# Patient Record
Sex: Male | Born: 1945 | Race: White | Hispanic: No | Smoking: Current some day smoker
Health system: Southern US, Community
[De-identification: ages and names within clinical notes are randomized; demographics above are authoritative.]

## PROBLEM LIST (undated history)

## (undated) DIAGNOSIS — I428 Other cardiomyopathies: Secondary | ICD-10-CM

## (undated) DIAGNOSIS — I447 Left bundle-branch block, unspecified: Secondary | ICD-10-CM

## (undated) DIAGNOSIS — Z87442 Personal history of urinary calculi: Secondary | ICD-10-CM

## (undated) DIAGNOSIS — I209 Angina pectoris, unspecified: Secondary | ICD-10-CM

## (undated) DIAGNOSIS — G459 Transient cerebral ischemic attack, unspecified: Secondary | ICD-10-CM

## (undated) DIAGNOSIS — E079 Disorder of thyroid, unspecified: Secondary | ICD-10-CM

## (undated) DIAGNOSIS — I2510 Atherosclerotic heart disease of native coronary artery without angina pectoris: Secondary | ICD-10-CM

## (undated) DIAGNOSIS — I5042 Chronic combined systolic (congestive) and diastolic (congestive) heart failure: Secondary | ICD-10-CM

## (undated) DIAGNOSIS — I10 Essential (primary) hypertension: Secondary | ICD-10-CM

## (undated) DIAGNOSIS — M1990 Unspecified osteoarthritis, unspecified site: Secondary | ICD-10-CM

## (undated) DIAGNOSIS — N189 Chronic kidney disease, unspecified: Secondary | ICD-10-CM

## (undated) DIAGNOSIS — Z8679 Personal history of other diseases of the circulatory system: Secondary | ICD-10-CM

## (undated) DIAGNOSIS — Z95810 Presence of automatic (implantable) cardiac defibrillator: Secondary | ICD-10-CM

## (undated) DIAGNOSIS — I639 Cerebral infarction, unspecified: Secondary | ICD-10-CM

## (undated) DIAGNOSIS — F1010 Alcohol abuse, uncomplicated: Secondary | ICD-10-CM

## (undated) HISTORY — PX: VASECTOMY: SHX75

## (undated) HISTORY — DX: Left bundle-branch block, unspecified: I44.7

## (undated) HISTORY — DX: Alcohol abuse, uncomplicated: F10.10

## (undated) HISTORY — DX: Personal history of other diseases of the circulatory system: Z86.79

## (undated) HISTORY — PX: CARDIAC CATHETERIZATION: SHX172

## (undated) HISTORY — DX: Other cardiomyopathies: I42.8

## (undated) HISTORY — PX: CYSTOSCOPY W/ STONE MANIPULATION: SHX1427

## (undated) HISTORY — PX: TONSILLECTOMY: SUR1361

## (undated) HISTORY — DX: Disorder of thyroid, unspecified: E07.9

## (undated) HISTORY — PX: INSERT / REPLACE / REMOVE PACEMAKER: SUR710

## (undated) HISTORY — DX: Cerebral infarction, unspecified: I63.9

---

## 1997-10-31 ENCOUNTER — Ambulatory Visit (HOSPITAL_COMMUNITY): Admission: RE | Admit: 1997-10-31 | Discharge: 1997-10-31 | Payer: Self-pay | Admitting: Urology

## 1997-11-09 ENCOUNTER — Encounter: Payer: Self-pay | Admitting: Urology

## 1997-11-09 ENCOUNTER — Ambulatory Visit (HOSPITAL_COMMUNITY): Admission: RE | Admit: 1997-11-09 | Discharge: 1997-11-09 | Payer: Self-pay | Admitting: Urology

## 2000-01-17 ENCOUNTER — Ambulatory Visit (HOSPITAL_COMMUNITY): Admission: RE | Admit: 2000-01-17 | Discharge: 2000-01-17 | Payer: Self-pay | Admitting: Cardiology

## 2000-01-20 ENCOUNTER — Ambulatory Visit (HOSPITAL_COMMUNITY): Admission: RE | Admit: 2000-01-20 | Discharge: 2000-01-20 | Payer: Self-pay | Admitting: Cardiology

## 2000-10-06 ENCOUNTER — Encounter: Admission: RE | Admit: 2000-10-06 | Discharge: 2000-10-06 | Payer: Self-pay | Admitting: Cardiology

## 2001-02-26 ENCOUNTER — Encounter: Admission: RE | Admit: 2001-02-26 | Discharge: 2001-02-26 | Payer: Self-pay | Admitting: Internal Medicine

## 2001-02-26 ENCOUNTER — Encounter: Payer: Self-pay | Admitting: Internal Medicine

## 2001-03-02 ENCOUNTER — Encounter: Admission: RE | Admit: 2001-03-02 | Discharge: 2001-04-20 | Payer: Self-pay | Admitting: Internal Medicine

## 2003-05-12 ENCOUNTER — Inpatient Hospital Stay (HOSPITAL_COMMUNITY): Admission: EM | Admit: 2003-05-12 | Discharge: 2003-05-16 | Payer: Self-pay | Admitting: Emergency Medicine

## 2004-02-01 ENCOUNTER — Ambulatory Visit (HOSPITAL_COMMUNITY): Admission: RE | Admit: 2004-02-01 | Discharge: 2004-02-01 | Payer: Self-pay | Admitting: Cardiology

## 2004-08-08 ENCOUNTER — Ambulatory Visit: Payer: Self-pay | Admitting: Psychiatry

## 2004-08-08 ENCOUNTER — Inpatient Hospital Stay (HOSPITAL_COMMUNITY): Admission: RE | Admit: 2004-08-08 | Discharge: 2004-08-13 | Payer: Self-pay | Admitting: Psychiatry

## 2004-08-16 ENCOUNTER — Ambulatory Visit (HOSPITAL_COMMUNITY): Payer: Self-pay | Admitting: Psychiatry

## 2005-06-18 ENCOUNTER — Encounter: Payer: Self-pay | Admitting: Urology

## 2006-08-09 ENCOUNTER — Inpatient Hospital Stay (HOSPITAL_COMMUNITY): Admission: EM | Admit: 2006-08-09 | Discharge: 2006-08-13 | Payer: Self-pay | Admitting: Emergency Medicine

## 2006-08-10 ENCOUNTER — Encounter (INDEPENDENT_AMBULATORY_CARE_PROVIDER_SITE_OTHER): Payer: Self-pay | Admitting: Cardiology

## 2006-08-27 ENCOUNTER — Inpatient Hospital Stay (HOSPITAL_COMMUNITY): Admission: RE | Admit: 2006-08-27 | Discharge: 2006-08-28 | Payer: Self-pay | Admitting: Cardiology

## 2010-05-06 ENCOUNTER — Encounter (INDEPENDENT_AMBULATORY_CARE_PROVIDER_SITE_OTHER): Payer: Self-pay | Admitting: *Deleted

## 2010-05-16 NOTE — Letter (Signed)
Summary: Appointment - Reminder 2  [facility] [employer], Main Office  [address]   [place], [place] [address]   Phone: [phone]  Fax: [phone]     May 06, 2010 MRN: [id number]   [name] [address] [place], [place]  [address]   Dear Mr. Plamondon,  Our records indicate that it is time to schedule a follow-up appointment.  Dr.Taylor recommended that you follow up with [place] in April. It is very important that we reach you to schedule this appointment. We look forward to participating in your health care needs. Please contact us at the number listed above at your earliest convenience to schedule your appointment.  If you are unable to make an appointment at this time, give [place] a call so we can update our records.     Sincerely,   Glass blower/designer

## 2010-07-02 NOTE — H&P (Signed)
NAME:  AUGUSTIN, BUN NO.:  [id number]   MEDICAL RECORD NO.:  [id number]          PATIENT TYPE:  INP   LOCATION:  2031                         FACILITY:  [facility]   PHYSICIAN:  Elmore Guise., M.D.DATE OF BIRTH:  22-Jun-1945   DATE OF ADMISSION:  08/09/2006  DATE OF DISCHARGE:                              HISTORY & PHYSICAL   PRIMARY CARE PHYSICIAN:  Dr. Theresia Lo.   PRIMARY CARDIOLOGIST:  Dr. Mayford Knife.   REASON FOR ADMISSION:  Mild volume overload and CHF.   HISTORY OF PRESENT ILLNESS:  Mr. 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  The patient reports,  over the last week or two, he has now had shortness of breath at rest.  He has also been having some orthopnea and waking up short of breath.  The last 2 days, he has been having difficulty sleeping.  He tried to  take extra Lasix at home, without any significant relief.  He has not  had any angina or exertional chest pain.  He states he initially stop  smoking and all alcohol, because he thought this may be contributing to  his symptoms.  However, this has not shown any significant improvement.  He has also noted off and on lower extremity edema, which has been worse  over the last week.  He has had no fever or cough.  In the last 2 years,  the patient reports a 40-pound weight gain.  He does state that he used  to drink and smoke heavily after the death of his wife; however, has  quit over the last 5 days.  He is the primary caregiver to his daughter.   REVIEW OF SYSTEMS:  Are as per HPI.  All others are negative.   CURRENT MEDICATIONS:  1. Benazepril 40 mg daily.  2. Aspirin 81 mg daily.  3. Digoxin 0.25 mg daily.  4. Lasix 40 mg daily.  5.  Synthroid 150 mcg daily.  6. Metoprolol 50 mg twice daily.   ALLERGIES:  SULFA.   FAMILY HISTORY:  Positive for heart disease in his mother and father,  also cancer and hypertension.   SOCIAL HISTORY:  He is widowed.  He has got a long history of alcohol  and tobacco dependency, quit 5 days ago.   EXAM:  VITAL SIGNS:  He is afebrile.  Temperature is 97.5, blood  pressure is 150/90, heart rate is 80, respiratory rate is 24 to 28, sat  96% on room air.  GENERAL:  He is very pleasant middle-aged white male, alert and oriented  x4.  No acute distress.  HEENT:  Appear normal.  NECK:  Thick; however, no appreciable JVD.  He has no bruits.  Thyroid  feels normal.  LUNGS:  Clear.  HEART:  Regular with a normal S1, S2, soft 2/6 systolic ejection murmur  with S4 gallop noted.  ABDOMEN:  Obese, soft, nontender, nondistended.  No rebound or guarding.  EXTREMITIES:  Warm with 2+ pulses and trace pre-tibial edema.   His blood work shows a BNP of 829.  His point of care markers show  myoglobin of 48 and MB of less than 1.0 and troponin-I of less than  0.05.  His next set showed a troponin-I of 0.04, an MB of 1.2, and a CPK  was pending.  He had a BUN and creatinine of 13 and 1.0, potassium of  3.6.  He had a white count of 11.9 with hemoglobin of 15.3 and a  platelet count of 185.  His chest x-ray shows  no acute cardiopulmonary  disease.  His ECG shows normal sinus rhythm, 76 per minute, with  occasional PVC.  He has slight ST-T wave depression and noted in his  inferior lateral leads, which appears new from his old ECG tracing.  His  last Cardiolite was back in 2005, showed an EF of 25% with moderate LV  dilatation, with LV volume and in the 250 range.  He had no inducible  ischemia.   IMPRESSION:  1. Mild volume overloaded with elevated BNP.  2. History of systolic dysfunction with an EF of 25%.  3. Hypertension.  4. Hypothyroidism.  5. History of alcohol and tobacco abuse.   PLAN:   1. The patient be admitted to telemetry monitoring.  He will have      serial cardiac enzymes performed.  At this time, we will stop his      metoprolol and start Coreg 3.125 mg twice daily, as well as      continue his benazepril and digoxin.  He will get Lasix 40 mg IV      q.12 h. for two doses, then back to 40 mg IV daily.  We will check      an echo in the morning.  We will start Nitro paste for pre-load      reduction, to help with his dyspnea currently.  Unless he has any      further problems, further care will be by Dr. Eliott Nine.      Elmore Guise., M.D.  Electronically Signed     TWK/MEDQ  D:  08/09/2006  T:  08/09/2006  Job:  [id number]

## 2010-07-02 NOTE — Discharge Summary (Signed)
NAMEMarland Kitchen  Don [name] NO.:  [id number]   MEDICAL RECORD NO.:  [id number]          PATIENT TYPE:  INP   LOCATION:  2031                         FACILITY:  [facility]   PHYSICIAN:  Francisca December, M.D.  DATE OF BIRTH:  Jul 14, 1945   DATE OF ADMISSION:  08/09/2006  DATE OF DISCHARGE:  08/13/2006                               DISCHARGE SUMMARY   DISCHARGE DIAGNOSES:  1. Acute systolic congestive heart failure, left ventricle, resolved.  2. Ischemic cardiomyopathy with ejection fraction of 10% by echo.  3. Non-obstructive coronary artery disease with no ischemia by      Cardiolite.  4. Polysubstance abuse.  5. Hypertension.  6. Hypothyroidism.  7. Chronic class III congestive heart failure.  8. Prolonged QRS with ValHeFT criteria.   HOSPITAL COURSE:  [name] is a 65 year old male patient who is  admitted to [facility] on August 09, 2006 with acute heart failure with  ventricular systolic.  The patient was aggressively diuresed with IV  diuretics.  The patient has known ischemic cardiomyopathy.  2D echo  during the patient's hospitalization showed left ventricular dilatation  with diffuse LV hyperkinesis, EF 10%, mild to moderate MR, mild to  moderate TR.   The patient was diuresed aggressively in the hospital and was ready for  discharge on 08/13/2006 as part of the ValHeFT criteria.  Dr. Amil Amen  consulted with the patient regarding ICD bi-ventricular implantation  which was scheduled as an outpatient the week of 08/24/2006.   The patient is discharged home in stable condition.   LABORATORY STUDIES:  Hemoglobin 15.3, hematocrit 44.9, platelets 164,  white count 7.7, sodium 139, potassium 4.9, BUN 16, creatinine 0.89.  Cardiac enzymes negative.  BNP on admission is 829 but by discharge was  289.  TSH is 3.263, Digoxin 0.5.  CT of the head was performed because  of headache with dizziness. This showed no acute abnormalities.  Myocardial perfusion scan was  performed which showed no evidence of  myocardial ischemia with severe global hypokinesis, EF 18% down from 25%  on prior exam of 04/2003.   DISCHARGE MEDICATIONS:  The patient is discharged to home in stable  condition on the following medications:  1. Lotensin 40 mg.  2. Coreg 3.125 mg 1 p.o. b.i.d.  3. Enteric coated aspirin 325 mg a day.  4. Digoxin 0.25 mg a day.  5. Synthroid 150 mcg a day.  6. Lasix 40 mg one p.o. b.i.d..  7. Potassium 20 mEq 2 tablets daily.   Our office will call the patient regarding procedure date and time for  the AICD implantation.  He is instructed not to drink alcohol and not to  smoke.  We gave him instructions regarding low sodium diet.  He is to  weigh himself daily  and record.  Heart failure instructions including daily weights, any  weight gain, shortness of breath  or extremity swelling instructions  have been given to the patient and he is to notify us if he has any of  these listed symptoms.   ADDENDUM:  Please note the patient had smoking cessation counseling  provided during this stay.  Don [name], P.A.      Francisca December, M.D.  Electronically Signed    LB/MEDQ  D:  08/20/2006  T:  08/20/2006  Job:  [id number]

## 2010-07-02 NOTE — Op Note (Signed)
NAMEMarland Kitchen  DRAGO, HAMMONDS NO.:  [id number]   MEDICAL RECORD NO.:  [id number]          PATIENT TYPE:  INP   LOCATION:  2032                         FACILITY:  [facility]   PHYSICIAN:  Francisca December, M.D.  DATE OF BIRTH:  May 30, 1945   DATE OF PROCEDURE:  08/27/2006  DATE OF DISCHARGE:                               OPERATIVE REPORT   PROCEDURES PERFORMED:  1. Insertion of biventricular implantable cardioverter-defibrillator.  2. Defibrillation threshold testing.  3. Left subclavian venogram.  4. Coronary sinus venogram.   INDICATIONS FOR PROCEDURE:  Don [name] is a 65 year old man  with a severe nonischemic cardiomyopathy.  LVEF is 25%.  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  He is brought to the catheterization laboratory at this time for  insertion of a implantable cardio defibrillator with biventricular  pacing for cardiac resynchronization therapy.   PROCEDURE NOTE:  The patient brought to cardiac catheterization  laboratory in the fasting state.  The left prepectoral region was  prepped and draped in the usual sterile fashion.  Local anesthesia was  obtained with infiltration of 1% lidocaine with epinephrine throughout  the left prepectoral region.  A left subclavian venogram was then  performed with a peripheral injection of 20 mL of Omnipaque.  A digital  cine angiogram was obtained and road mapped to guide future left  subclavian puncture.  The venogram did demonstrate the subclavian vein  to be widely patent and coursing in a normal fashion over the anterior  surface of the first rib and beneath the middle third of the clavicle.  There was no evidence for persistence of the left superior vena cava.   A 7 to 8-cm incision was then made in the deltopectoral groove, and this  was carried down by sharp dissection and electrocautery to the  prepectoral fascia.  There, a pocket was formed  inferiorly and medially  using blunt dissection and electrocautery.  The pocket was then packed  with 1% kanamycin-soaked gauze.  The left subclavian vein was then  punctured three times using an 18-gauge thin-wall needle through which  was passed a 0.035-inch tight J guidewire.  Over the initial guidewire,  a 9-French tearaway sheath and dilator were advanced.  The dilator and  wire were removed, and the right ventricular lead was advanced to the  level of the right atrium, and the sheath was torn away.  Using standard  technique and fluoroscopic landmarks, the lead was manipulated into the  right ventricular apex.  There, excellent pacing parameters were  obtained as will be noted below.  This was an active fixation lead, and  the screw was advanced as appropriate.  The lead was tested for  diaphragmatic pacing at 10 volts, and none was found.  The lead was then  sutured into place using three separate 0 silk ligatures.  Over the  second guidewire, a 7-French tearaway sheath and dilator were advanced.  The dilator and wire were removed, and the atrial lead was advanced to  the level of the right atrium.  The sheath was  then torn away.  Using  standard technique and fluoroscopic landmarks, the lead was manipulated  into the right atrial appendage.  There, excellent pacing parameters  were obtained as will be noted below.  This was an active fixation lead,  and the screw was advanced as appropriate as well.  It was tested for  diaphragmatic pacing at 10 volts, and none was found.  The lead was then  sutured into place using three separate 0 silk ligatures.  Over the  remaining guidewire, a 9-French tearaway sheath and dilator were  advanced.  The dilator was removed and the wire remained in place.  A  Medtronic MB2 coronary sinus guiding catheter was then advanced over a  dilator into the right atrium.  The dilator was removed.  Using  fluoroscopic landmarks, the guidewire was manipulated  into the coronary  sinus.  The wire was removed and coronary sinus venograms obtained in  RAO and LAO projections.  These were road mapped to guide the future  placement of the left ventricular lead.  An excellent left lateral vein  was identified, and a Medtronic bipolar left ventricular pacing lead was  advanced into the vein.  I did have to place a [place] 0.014-inch  guidewire to place the lead into a more superior subbranch of the vein.  The guidewire was removed, and the lead was tested for adequate pacing  parameters which were excellent, and this is reported below as well.  The lead was tested for diaphragmatic and chest wall pacing at 10 volts,  and none was found.  The lead was then sutured into place using three  separate 0 silk ligatures.   We then prepared for defibrillation threshold testing.  The patient  received a total of 2 mg of hydromorphone, 100 mcg of fentanyl, 10 mg of  midazolam.  After establishing adequate moderate sedation, the patient  underwent ventricular fibrillation threshold testing.  Ventricular  fibrillation was induced by the shock on T technique.  The dysrhythmia  was promptly detected, the device charged, and delivered a 20-joule  shock at 47 ohms.  The total detection and charge time was 8 seconds.  There was prompt return of sinus rhythm.  There were no dropouts at 1.2  mV sensitivity.   Previously, the device had been placed in the pocket after copiously  irrigating the pocket with 1% kanamycin solution and removing the  previously paced kanamycin-soaked gauze.  The leads were wound beneath  the pacing generator when it was placed in the pocket.  An 0 silk  anchoring suture was applied.  The pocket was then closed using 2-0  Vicryl in a running fashion for the subcutaneous layer, two layers  applied.  The skin was approximated using 4-0 Vicryl in a running  subcuticular fashion.  Steri-Strips and a sterile dressing were applied.   The patient  was transported to the recovery area in stable condition in  an A sense biventricular pace mode.   EQUIPMENT DATA:  The pacing generator is a Medtronic [place], model  number [id number], serial number [id number].  The atrial lead is a  Medtronic model number [id number], serial number [id number].  The right  ventricular lead is a Child psychotherapist, model number  [id number], serial number [id number].  The left ventricular lead is a  Medtronic model number [id number], serial number [id number].   PACING DATA:  The atrial lead detected a 2.0 mV P-wave.  The pacing  threshold was 0.7 volts at 0.5 millisecond pulse  width.  The impedance  was 604 ohms.  This resulted in a current at capture threshold of 1.7  MA.  The right ventricular lead detected a 13.0 mV R-wave.  The pacing  threshold was 0.5 volts at 0.5 millisecond pulse width.  The impedance  was 1077 ohms, resulting in a current at capture threshold of 0.6 MA.  The left ventricular lead had a pacing threshold of 0.4 volts at 0.5  millisecond pulse width.  The impedance was 1128 ohms, and that resulted  in a current at capture threshold of 0.5 MA.  Each lead had negative  diaphragmatic or chest wall pacing at 10 volts.      Francisca December, M.D.  Electronically Signed     JHE/MEDQ  D:  08/27/2006  T:  08/28/2006  Job:  [id number]   cc:   Armanda Magic, M.D.

## 2010-07-05 ENCOUNTER — Encounter: Payer: Self-pay | Admitting: Internal Medicine

## 2010-07-05 NOTE — Discharge Summary (Signed)
NAME:  Don [name], Don [name]                    ACCOUNT NO.:  [id number]   MEDICAL RECORD NO.:  [id number]                   PATIENT TYPE:  INP   LOCATION:  4743                                 FACILITY:  [facility]   PHYSICIAN:  Armanda Magic, M.D.                  DATE OF BIRTH:  1945-02-20   DATE OF ADMISSION:  05/12/2003  DATE OF DISCHARGE:  05/16/2003                                 DISCHARGE SUMMARY   HISTORY OF PRESENT ILLNESS:  The patient is a 65 year old male with known  severe alcoholism with associated dilated cardiomyopathy secondary to  alcoholism as well as a history of coronary artery disease.  In November of  2001, it was discovered that he had single vessel obstructive coronary  artery disease in the mid LAD up to 70%, not physiologically significant  obstruction of flow to require stent at that time.  He was admitted on May 12, 2003, to the ER after going to one of our [place] outpatient facilities  due to epistaxis.  In regard to his cardiac symptoms, he has had a three to  four-week history of progressive shortness of breath and chest pain mainly  at night or early morning, no exertional chest pain.  He was sent over to  the ER at [facility].  In addition to the continued epistaxis, he  was having an increased number of PVC's on his EKG.  While in the ER here,  he was given a nitroglycerin with some relief of his chest pain.  The  duration of the chest pain itself was several minutes.  He had a similar  episode of chest pain that morning prior to coming to the ER that only  lasted a few seconds.  Again he has had several similar episodes over the  past week.  On initial evaluation, the patient's vital signs were slightly  abnormal with a blood pressure of 141/97, heart rate 92 and regular,  respirations 20, no fever.  Physical examination was unremarkable except for  the following findings:  There was dried blood around the right naris.  Lungs were mostly clear  to auscultation, but they were decreased lung sounds  in the bases.  Otherwise examination was normal.   LABORATORY DATA:  Admitting laboratory values showed a normal electrolyte  panel except for a mildly low potassium of 3.6, creatinine of 0.8, BUN 25,  hemoglobin 15.5, white count 8900, platelet count 253,000.  CPK was normal  at 91, MB 2.6, and troponin 0.02.  EKG done showed sinus rhythm with CBC.  No ST T wave changes that would be consistent with ischemia. The patient was  admitted with the following diagnoses.   ADMISSION DIAGNOSES:  1. Atypical chest pain in a patient with known single vessel coronary artery     disease.  2. Dilated cardiomyopathy secondary to alcohol abuse.  3. Increased frequency of premature ventricular contractions with apparent  symptoms secondary to dilated cardiomyopathy plus or minus secondary to     continued alcohol abuse.  4. Borderline hyperkalemia.  5. Alcohol abuse.  6. Tobacco abuse.  7. New onset epistaxis of unclear etiology.  Will need to consult ENT     physicians.   HOSPITAL COURSE:  Problem 1.  Atypical chest pain in patient with known  single vessel CAD.  The patient was admitted to the telemetry floor where he  was started on routine serial enzymes and continued on his normal  medications, but due to his epistaxis aspirin was held.  This was felt safe  to do secondary to the fact that the patient had no ischemic changes on his  EKG.  Also we chose not to do any other anticoagulation secondary to his  epistaxis.  Due to his blood pressure being out of control, his Toprol was  increased to 50 mg per day and his potassium was repleted orally.  His  subsequent cardiac isoenzymes were negative so he was set up for an  Adenosine Cardiolite. This was done on May 15, 2003, and showed no  inducible ischemia, EF 25%, unchanged from previous study.  Unfortunately,  the patient's blood pressures remained somewhat elevated at 166/108,   therefore, the patient remained in the hospital and was discharged after  blood pressure was better controlled.   Problem 2.  Uncontrolled hypertension.  As previously mentioned, the patient  has had uncontrolled hypertension since arrival.  Unclear if this is related  to problems due to alcohol withdrawal or an underlying problems.  The  patient has not been compliant with medications prior to admission.  As  previously mentioned, his Toprol has been increased to 50 mg daily.  Subsequently this was increased to 75 mg daily.  His ACE inhibitor was  increased to 10 mg b.i.d. and there was some concern that with complaints of  increased facial pain that this may be contributing to his hypertensive  issues.  He was watched for 24 hours and by May 16, 2003, his blood  pressure was down to 156/89.   Problem 3.  Epistaxis.  ENT was consulted on May 13, 2003.  Gloris Manchester.  [facility], M.D. saw this patient.  The patient subsequently underwent packing  to the anterior nose bilaterally.  Prior to this, direct visualization of  the nares was performed by Dr. Lazarus Salines. No bleeding sites were identified.  There was some oozing between the high septum and the mid portion of the  middle turbinate without any exact identification of an appropriate bleeding  site.  Lidocaine as well as 1:100,000 solution of epinephrine on pledgets  was applied up into the vaults of the nose for additional topical  anesthesia. Silver nitrate cautery was performed and the patient tolerated  this procedure well.  Again, he did recommend holding off on aspirin at the  present time.  By date of discharge, the patient was not having any  difficulty with continued bleeding from the nose especially with better well  controlled blood pressure and it was recommended per ENT that he follow up  with them in their office on Wednesday or Thursday of the week following to  have packing removed.  Problem 4.  Hypothyroidism.  The patient  had this diagnosis prior to coming  in.  Unclear if the patient has been taking his medications appropriately in  the outpatient setting.  TSH was 13.474.  The patient was continued on his  current dose of Synthroid  and we will repeat TSH in our office after  discharge.   Problem 5.  Dyslipidemia.  The patient's lipid panel was checked fasting and  was as follows:  Cholesterol 158, triglycerides 219, HDL cholesterol 43, LDL  cholesterol 71.  The patient was continued on the same dose of Zocor.   DISCHARGE DIAGNOSES:  1. Chest pain in patient with known single vessel left anterior descending     disease.  Nonischemic Cardiolite study this admission.  2. Dilated cardiomyopathy. Ejection fraction remains low at 25% per     Cardiolite study.  3. Uncontrolled hypertension, better after addressing issues related to     pain, appropriate medication dosages, and alcohol withdrawal.  4. Dyslipidemia.  5. Epistaxis requiring nasal packing per ENT, stable.  6. Alcohol abuse and tobacco abuse.  7. Hypothyroidism, not controlled.  8. Chronic obstructive pulmonary disease in a patient that continues to have     tobacco abuse despite recurrent counseling to abstain from this behavior.   DISCHARGE MEDICATIONS:  1. Altace 10 mg twice daily, this is a new dose for this patient.  2. Toprol XL 75 mg daily, this is a new dose for this patient.  3. Zocor 40 mg daily.  4. Synthroid 0.125 mg daily.  5. Lasix at previous home dosage.  6. Digoxin at previous home dosage.   ACTIVITY:  As previous.  ENT has recommended no strenuous activity for the  next four to five days.   DIET:  Cardiac low salt modifier.  The patient is instructed in not taking  in any alcohol; beer, wine, or liquor.   DISCHARGE INSTRUCTIONS:  He has been instructed to bring all of his  medications including over-the-counter medications to his follow-up  appointment with Dr. Mayford Knife.   FOLLOW UP:  1. He needs to call Dr. Raye Sorrow  office to have an appointment scheduled on     Wednesday or Thursday to have the packing removed.  2. He is to follow up with Dr. Mayford Knife on Monday, April 18, at 2:15 p.m.  He     is to have a blood pressure check done in our office on April 5 at 9:30     a.m. and then he will have a BMET checked.  3. He is to have a TSH done in our office in six weeks which will be on May     10, any time after 7:30 a.m.      Allison L. Ulla Gallo, M.D.    ALE/MEDQ  D:  06/05/2003  T:  06/06/2003  Job:  [id number]   cc:   Zola Button T. Lazarus Salines, M.D.  [address] [place]  [place] [address]  Fax: [phone]

## 2010-07-05 NOTE — Consult Note (Signed)
NAME:  Don [name], Don [name]                    ACCOUNT NO.:  [id number]   MEDICAL RECORD NO.:  [id number]                   PATIENT TYPE:  INP   LOCATION:  4743                                 FACILITY:  [facility]   PHYSICIAN:  Zola Button T. Lazarus Salines, M.D.              DATE OF BIRTH:  [birth year]   DATE OF CONSULTATION:  05/13/2003  DATE OF DISCHARGE:                                   CONSULTATION   CHIEF COMPLAINT:  Epistaxis.   HISTORY OF PRESENT ILLNESS:  A 65 year old white male with a heavy alcohol  and tobacco abuse history including congestive heart failure and alcoholic  cardiomyopathy who began having left-sided anterior epistaxis 3-4 days ago.  It has been episodic and quite profuse.  He was taking one baby aspirin  daily prophylactically, but stopped several days ago.  He had chest pain one  day ago, and was given a full adult aspirin.  Otherwise, no known  anticoagulants, and no known bleeding tendencies.  No recent upper  respiratory infection, sinus infection.  No history of nasal trauma or  surgery.  No past history of epistaxis.  The right side is not bleeding at  all.   PAST MEDICAL HISTORY:  He is allergic to SULFA drugs.  He had a coronary  catheterization 4 years ago revealing single vessel significant coronary  disease.  He has congestive failure.   SOCIAL HISTORY:  He lost his wife almost 6 years ago, and is in the process  of a divorce currently.  He smokes 3 packs daily.  He drinks one-half to one-  fifth of liquor daily.   FAMILY HISTORY:  Noncontributory.   REVIEW OF SYSTEMS:  He is not bleeding from any other site including  brushing his teeth or passing urine or bowel movements.   PHYSICAL EXAMINATION:  GENERAL:  This is a somewhat stocky, overweight,  middle-aged white male in no obvious distress.  He is not actively bleeding.  Mental status is intact.  He hears well in conversational speech.  Voice is  clear, and respirations unlabored through the nose.  HEENT:  Head is atraumatic.  Cranial nerves intact.  The right ear canal is  clear with normal aerated drum.  Left ear canal is slightly waxy.  There is  a little bit of blood behind the drum, consistent with blowing his nose in  the face of epistaxis.  The anterior nose shows some bloody soiling in the  anterior vesitbule on the left side.  The right side is clean.  The oral  cavity is clear with teeth in good repair.  Moist membranes.  No  telangiectasias.  A small amount of blood in the oropharynx.  Did not  examine the nasopharynx or hypopharynx.  NECK:  Supple.  Without adenopathy.   IMPRESSION:  Left anterior epistaxis.  Compounding factors including  hypertension and aspirin therapy.   PLAN:  With informed consent, I anesthetized the anterior nose beginning  with a single __________ on each side, followed by 1% Xylocaine with  1:100,000 epinephrine applied on pledgets to the high and low nose on both  sides.   After allowing several minutes for this to take effect, the packing was  removed.  The blood from the left nasal vestibule was suctioned clear.  No  bleeding sites were identified.  There was some oozing between the high  septum and the mid-portion of the middle turbinate, but I could not  visualize an exact bleeding site, but I could see the area where it was  coming from.  Additional 1% Xylocaine with 1:100,000 epinephrine on pledgets  was applied up into the vault of the nose for additional topical anesthesia.  After allowing several additional minutes for this to take effect, silver  nitrate cautery was performed of this area with reasonable tolerance, and  with good control of the bleeding.  The patient experienced no shortness of  breath or chest pain during the procedure.   I will have him use nasal hygiene measures, hold off on aspirin if agreeable  to cardiology, and limit his activity slightly for the next 4-5 days.  I  would do nasal hygiene measures in the  hospital, and order for some Tylenol  No. 3 for mild pain relief.  I will see him in my office in 1 months, sooner  as needed.  He understands and agrees.                                               Gloris Manchester. Lazarus Salines, M.D.    KTW/MEDQ  D:  05/13/2003  T:  05/13/2003  Job:  [id number]   cc:   Armanda Magic, M.D.  [address], [address]  [place], [place] [address]  Fax: [phone]

## 2010-07-05 NOTE — Discharge Summary (Signed)
NAMEMarland [name]  Don [name], [name] NO.:  [id number]   MEDICAL RECORD NO.:  [id number]          PATIENT TYPE:  INP   LOCATION:  2032                         FACILITY:  [facility]   PHYSICIAN:  Francisca December, M.D.  DATE OF BIRTH:  November 12, 1945   DATE OF ADMISSION:  08/27/2006  DATE OF DISCHARGE:  08/28/2006                               DISCHARGE SUMMARY   DISCHARGE DIAGNOSES:  1. 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  2. Chronic left ventricle systolic heart failure.  3. Left bundle branch block.  4. Family history of heart disease.  5. Allergy to SULFA.  6. Hypothyroidism, treated.  7. History of alcohol and tobacco abuse.   HOSPITAL COURSE:  Don [name] is a 65 year old male patient who was  brought into the hospital for insertion of biventricular implantable  cardio defibrillator on August 27, 2006.  He has a history of congestive  heart failure, nonischemic cardiomyopathy and a wide QRS consistent with  a left bundle branch block.  He was brought into the hospital for this  device implant.  The device was successfully implanted on August 27, 2006.   The patient remained in the hospital overnight without sequela.  The  device was interrogated with good results.  Chest x-ray showed no  pneumothorax and the patient was discharged to home.  Please note that  the patient did have an issue with his great toe, having pain, he was  diagnosed with gout and treated approximately.   DISCHARGE MEDICATIONS:  1. Lasix 40 mg a day.  2. Synthroid 150 mcg a day.  3. Potassium 20 mEq a day.  4. Coreg 6.25 mg p.o. b.i.d.  5. Digoxin 0.25 mg daily.  6. Coated aspirin 325 mg a day.  7. Benazepril 40 mg a day.  8. Colchicine as directed.   Wound care and activity and instructions were given to the patient on  separate forms.  Patient is to remain on a low-sodium, heart-healthy  diet.  Follow  up with Dr. Amil Amen in the next 10 days.      [name], P.A.      Francisca December, M.D.  Electronically Signed    LB/MEDQ  D:  09/22/2006  T:  09/22/2006  Job:  [id number]

## 2010-07-05 NOTE — H&P (Signed)
NAME:  Don [name], Don [name] NO.:  [id number]   MEDICAL RECORD NO.:  [id number]          PATIENT TYPE:  IPS   LOCATION:  0306                          [facility]   PHYSICIAN:  Geoffery Lyons, M.D.      DATE OF BIRTH:  [birth year]   DATE OF ADMISSION:  08/08/2004  DATE OF DISCHARGE:                         PSYCHIATRIC ADMISSION ASSESSMENT   IDENTIFYING INFORMATION:  This is a 65 year old married white male.  Apparently, he told his private physician, Dr. Sharlet Salina, that he was  ready to quit drinking.  He was sent to the emergency room at [facility]  yesterday for medical clearance and he was admitted to the [facility] for detoxification.  His alcohol level was 80.  His UDS was negative.  The patient states that he has drank pretty much his whole adult life.  He  began drinking at age 65.  For the past seven years, he has been drinking at  least a fifth of vodka a day.  He states that he has quit several times in  the past, often having at least a year of sobriety.  The trauma that  occurred seven years ago was the death of his wife from bone cancer.  His 83-  year-old daughter was 5 at the time.  She still lives with him.   PAST PSYCHIATRIC HISTORY:  He has had no outpatient treatment and no prior  detoxification.   SOCIAL HISTORY:  He, himself, has a Scientist, water quality in counseling.  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  He has been married four times.  The first time was for 20  years.  The second time, his wife died after developing bone cancer, they  had been married approximately 5-6 years.  Then his third marriage was a  brief mistake and this fourth marriage, he has only been married two months  at this time.  He has grown children, two sons, ages 35 and 28, and then the  19 year old daughter.  He also has two grandchildren, ages 36 and 31.  He  states his father was an alcoholic.  He quit drinking at age 42 and then  worked with alcoholics the rest of his  life.   ALCOHOL/DRUG HISTORY:  As already stated, he has been drinking vodka, a  fifth a day, and also smokes two packs of cigarettes per day.   PRIMARY CARE PHYSICIAN:  Sharlet Salina, M.D. here in [facility].   MEDICAL HISTORY:  Medical problems are congestive heart failure,  hypertension, TIAs, angina.  He is status post partial resection of his  thyroid and is subsequently treated with Synthroid.   CURRENT MEDICATIONS:  Vytorin 10/40 1 p.o. q.a.m., Zoloft 100 mg, 1 p.o.  q.a.m., Zocor 40 mg, 1 p.o. q.a.m., metoprolol 50 mg, 1 p.o. b.i.d., Benicar  20 mg, 1 p.o. q.a.m., levothyroxine 15 mcg, 1 p.o. q.a.m., Altace 10 mg, 1  p.o. q.a.m., Digitek 0.25 mg, 1 p.o. q.a.m. and Lasix 40 mg p.o. q.a.m.   ALLERGIES:  SULFA.  It upsets his stomach.   PHYSICAL EXAMINATION:  As per the ER.   MENTAL STATUS  EXAM:  He is alert and oriented x 3.  He is appropriately  groomed, dressed and nourished.  His speech is normal rate, rhythm and tone.  His mood is subdued.  His affect is appropriate to the situation but has a  normal range.  His thought processes are clear, rational and goal-oriented.  Judgment and insight are intact.  Concentration and memory are intact.  He  denies suicidal or homicidal ideation.  He denies auditory or visual  hallucinations.   DIAGNOSES:   AXIS I:  1.  Alcohol dependence.  2.  Rule out delayed grief reaction.   AXIS II:  Deferred.   AXIS III:  1.  Congestive heart failure.  2.  Hypertension.  3.  Transient ischemic attack.  4.  Angina.  5.  Hypothyroidism.   AXIS IV:  Family relationships.   AXIS V:  45.   PLAN:  To help him to safely withdrawal from alcohol.  Toward that end, he  was begun on the low dose Librium protocol.  Today, he exhibits no tremor.  He denies side effects and states that he is tolerating withdrawal well and  denies cravings.  He will see Dr. Dub Mikes in the morning for further treatment  planning and discussion of  medication.       MD/MEDQ  D:  08/09/2004  T:  08/09/2004  Job:  [id number]

## 2010-07-05 NOTE — Discharge Summary (Signed)
NAME:  Don [name], Don [name] NO.:  [id number]   MEDICAL RECORD NO.:  [id number]          PATIENT TYPE:  IPS   LOCATION:  0306                          [facility]ILITY[facility]   PHYSICIAN:  Geoffery Lyons, M.D.      DATE OF BIRTH:  12-03-1945   DATE OF ADMISSION:  08/08/2004  DATE OF DISCHARGE:  08/13/2004                                 DISCHARGE SUMMARY   CHIEF COMPLAINT AND PRESENT ILLNESS:  This was the first admission to [facility] for this 65 year old married white male.  He told his  primary physician, Dr. Rosezetta Schlatter, that he was ready to quit drinking.  He was  sent to the emergency room at [facility] yesterday for medical clearance.  He was admitted to the [facility] for detox.  Alcohol level  was 80.  UDS was negative.  He has drank pretty much his whole adult life.  He began drinking at age 82.  Has been drinking at least a fifth of vodka a  day.  He has quit several times in the past.  Often having at least a year  of sobriety.  Seven years ago, his wife died from bone cancer.  His 12-year-  old daughter was 5 at that time.  She still lives with him.   PAST PSYCHIATRIC HISTORY:  No prior outpatient treatment.  No prior detox.   ALCOHOL/DRUG HISTORY:  As already stated, drinking vodka, a fifth a day, on  a regular basis for the last several  years.  Worse since his wife died.   MEDICAL HISTORY:  Congestive heart failure, hypertension, TIAs, angina,  status post partial resection of his thyroid.   MEDICATIONS:  Vytorin 10/40 mg, 1 in the morning, Zoloft 100 mg in the  morning, Zocor 40 mg, 1 in the morning, metoprolol 50 mg, 1 twice a day,  Benicar 20 mg, 1 in the morning, Levothyroxine 50 mcg, 1 in the morning,  Altace 10 mg, 1 in the morning, Digitek 0.25 mg, 1 in the morning and Lasix  40 mg in the morning.   PHYSICAL EXAMINATION:  Performed and failed to show any acute findings.   LABORATORY DATA:  CBC within normal limits.  Blood  chemistry within normal  limits.  Liver enzymes with SGOT 36, SGPT 53.  TSH 7.249.   MENTAL STATUS EXAM:  Alert, cooperative male.  Appropriately groomed and  dressed.  His speech was normal in rate, rhythm and tone.  Mood was subdued.  Affect was appropriate to the situation.  Normal range.  Thought processes  were clear, rational and goal-oriented.  Judgment and insight were intact.  Concentration and memory were well-preserved.  No evidence of delusion.  No  suicidal or homicidal ideation.   ADMISSION DIAGNOSES:   AXIS I:  1.  Alcohol dependence.  2.  Depressive disorder not otherwise specified.   AXIS II:  No diagnosis.   AXIS III:  1.  Congestive heart failure.  2.  Hypertension.  3.  Transient ischemic attack.  4.  Angina.  5.  Hypothyroidism.   AXIS IV:  Moderate.   AXIS  V:  Global Assessment of Functioning upon admission 35; highest Global  Assessment of Functioning in the last year 65.   HOSPITAL COURSE:  He was admitted.  He was started in individual and group  psychotherapy.  Was detoxified with Librium.  He was maintained on his home  medications.  He was started on Campral 2 pills three times a day.  He then  endorsed that he had been drinking for 26 years, worse when his wife died  seven years prior to this admission.  Vital signs, upon admission, were  blood pressure 170/103.  Endorsed commitment to being detoxed and  maintaining abstinence.  Recently remarried two months prior to this  admission.  His new wife wanted him to stop drinking.  Motivated to stay  sober.  He is retired and will try to keep himself busy.  He could not  validate any ongoing depression although he can admit that he got depressed  when his wife died.  Retired.  Used to be a Veterinary surgeon.  A lot of time to  himself.  Claimed that he wanted to be healthy.  Very subdued.  Somber.  Willing to abstain.  On June 27th, he was . . .   Dictation ended at this point.       IL/MEDQ  D:   09/04/2004  T:  09/04/2004  Job:  [id number]

## 2010-07-10 ENCOUNTER — Ambulatory Visit (INDEPENDENT_AMBULATORY_CARE_PROVIDER_SITE_OTHER): Payer: Medicare Other | Admitting: Internal Medicine

## 2010-07-10 ENCOUNTER — Encounter: Payer: Self-pay | Admitting: Internal Medicine

## 2010-07-10 DIAGNOSIS — I5022 Chronic systolic (congestive) heart failure: Secondary | ICD-10-CM

## 2010-07-10 DIAGNOSIS — I472 Ventricular tachycardia, unspecified: Secondary | ICD-10-CM | POA: Insufficient documentation

## 2010-07-10 DIAGNOSIS — I428 Other cardiomyopathies: Secondary | ICD-10-CM

## 2010-07-10 DIAGNOSIS — I509 Heart failure, unspecified: Secondary | ICD-10-CM

## 2010-07-10 DIAGNOSIS — Z95810 Presence of automatic (implantable) cardiac defibrillator: Secondary | ICD-10-CM | POA: Insufficient documentation

## 2010-07-10 DIAGNOSIS — I5042 Chronic combined systolic (congestive) and diastolic (congestive) heart failure: Secondary | ICD-10-CM | POA: Insufficient documentation

## 2010-07-10 NOTE — Progress Notes (Signed)
HPI Don [name] is referred today by Dr. Mayford Knife for evaluation and ongoing management of a nonischemic cardiomyopathy, ventricular tachycardia, and status post ICD. the patient has an extensive past medical history. He has a history of alcohol abuse in the past. He has a nonischemic cardiomyopathy. He has left bundle branch block and underwent BiV ICD implantation in 2008. He has a history of obesity and has recently lost 50 pounds. He has had no syncope but he does feel occasional lightheadedness and palpitations. No ICD shocks. Allergies  Allergen Reactions  . Sulfa Antibiotics      Current Outpatient Prescriptions  Medication Sig Dispense Refill  . allopurinol (ZYLOPRIM) 100 MG tablet Take 100 mg by mouth daily.        Marland Kitchen aspirin 81 MG tablet Take 81 mg by mouth daily.        . benazepril (LOTENSIN) 40 MG tablet Take 40 mg by mouth daily.        . carvedilol (COREG) 6.25 MG tablet Take 6.25 mg by mouth 2 (two) times daily with a meal.        . Chlorpheniramine-PSE-Ibuprofen 2-30-200 MG TABS Take by mouth.        . ezetimibe-simvastatin (VYTORIN) 10-20 MG per tablet Take 1 tablet by mouth at bedtime.        . Fish Oil OIL 1,200 mg by Does not apply route 2 (two) times daily.        . furosemide (LASIX) 40 MG tablet Take 40 mg by mouth daily.        Marland Kitchen levothyroxine (SYNTHROID, LEVOTHROID) 150 MCG tablet Take 150 mcg by mouth daily.           Past Medical History  Diagnosis Date  . Nonischemic cardiomyopathy     EF 20% s/p INSERTION OF BI-V DEFIBRILLATOR.  . Chronic systolic heart failure     LEFT  . LBBB (left bundle branch block)   . Thyroid disease     HYPOTHYROIDISM.....TREATED  . Alcohol abuse     PREVIOUS H/O DRINKING A FIFTH OF VODKA  A DAY  . Stroke     TIA's  . History of angina     ROS:   All systems reviewed and negative except as noted in the HPI.   Past Surgical History  Procedure Date  . Insert / replace / remove pacemaker     BI-V IMPLANTABLE  CARDIOVERTER-DEFIBRILLATOR. ; [employer] CONCERTO; MODEL  # [id number], SERIAL # [id number] H.  DR. Humberto Leep EDMUNDS.     Family History  Problem Relation Age of Onset  . Heart disease Mother   . Heart disease Father   . Cancer    . Hypertension       History   Social History  . Marital Status: Married    Spouse Name: N/A    Number of Children: N/A  . Years of Education: N/A   Occupational History  . RETIRED     SOCIAL WORKER   Social History Main Topics  . Smoking status: Current Everyday Smoker  . Smokeless tobacco: Not on file  . Alcohol Use: No     H/O ALCOHOLISM PERVIOUSLY A FIFTH OF VODKA A DAY  . Drug Use: No  . Sexually Active:    Other Topics Concern  . Not on file   Social History Narrative   BI-V IMPLANTABLE CARDIOVERTER-DEFIBRILLATOR; [employer] CONCERTO, MODEL # [id number]; SERIAL # [id number] H; RETIRED SOCIAL WORKERMARRIED 4 TIMESH/O ALCOHOLISM; A FIFTH OF VODKA DAILY IN THE PAST  BP 95/60  Pulse 73  Ht 6\' 1"  (1.854 m)  Wt 211 lb (95.709 kg)  BMI 27.84 kg/m2  Physical Exam:  Well appearing NAD HEENT: Unremarkable Neck:  No JVD, no thyromegally Lymphatics:  No adenopathy Back:  No CVA tenderness Lungs:  Clear. Well-healed ICD incision. HEART:  Regular rate rhythm, no murmurs, no rubs, no clicks Abd:  Flat, positive bowel sounds, no organomegally, no rebound, no guarding Ext:  2 plus pulses, no edema, no cyanosis, no clubbing Skin:  No rashes no nodules Neuro:  CN II through XII intact, motor grossly intact  DEVICE  Normal device function.  See PaceArt for details.   Assess/Plan:

## 2010-07-10 NOTE — Assessment & Plan Note (Signed)
His symptoms appear to be class II. He has been bothered some low blood pressure. I have asked the patient to monitor his blood pressure and if it remains low, he'll need to decrease his medications. He will call our office if this occurs. Alternatively, I have asked him to call Dr. Carolanne Grumbling.

## 2010-07-10 NOTE — Assessment & Plan Note (Signed)
Interrogation of his ICD today demonstrates nonsustained VT. He has had no ICD shocks. He will continue his current medications.

## 2010-07-10 NOTE — Patient Instructions (Signed)
Remote monitoring is used to monitor your Pacemaker of ICD from home. This monitoring reduces the number of office visits required to check your device to one time per year. It allows [place] to keep an eye on the functioning of your device to ensure it is working properly. You are scheduled for a device check from home on October 10, 2010. You may send your transmission at any time that day. If you have a wireless device, the transmission will be sent automatically. After your physician reviews your transmission, you will receive a postcard with your next transmission date. Your physician wants you to follow-up in: 12 months with Dr. Ladona Ridgel. You will receive a reminder letter in the mail two months in advance. If you don't receive a letter, please call our office to schedule the follow-up appointment.

## 2010-07-10 NOTE — Assessment & Plan Note (Signed)
His device is working normally. We'll recheck in several months. 

## 2010-10-10 ENCOUNTER — Encounter: Payer: Medicare Other | Admitting: *Deleted

## 2010-10-17 ENCOUNTER — Encounter: Payer: Self-pay | Admitting: *Deleted

## 2010-12-04 LAB — APTT: aPTT: 29

## 2010-12-04 LAB — POCT CARDIAC MARKERS
CKMB, poc: <1 — ABNORMAL LOW
Myoglobin, poc: 48.4
Operator id: 192351

## 2010-12-04 LAB — I-STAT 8, (EC8 V) (CONVERTED LAB)
BUN: 13
Glucose, Bld: 108 — ABNORMAL HIGH
Hemoglobin: 16.3
Potassium: 3.6
Sodium: 139

## 2010-12-04 LAB — BASIC METABOLIC PANEL
BUN: 14
CO2: 28
Calcium: 8.2 — ABNORMAL LOW
Chloride: 103
Chloride: 104
Chloride: 105
GFR calc Af Amer: >60
GFR calc Af Amer: >60
GFR calc Af Amer: >60
GFR calc Af Amer: >60
GFR calc non Af Amer: >60
Glucose, Bld: 97
Potassium: 3.4 — ABNORMAL LOW
Potassium: 3.5
Sodium: 139
Sodium: 140
Sodium: 140

## 2010-12-04 LAB — CK TOTAL AND CKMB (NOT AT ARMC)
CK, MB: 1.2
Relative Index: INVALID
Total CK: 43

## 2010-12-04 LAB — CBC
HCT: 44.9
HCT: 45.3
MCV: 87.1
MCV: 88.3
Platelets: 185
RBC: 5.16
WBC: 11.9 — ABNORMAL HIGH
WBC: 7.7

## 2010-12-04 LAB — CARDIAC PANEL(CRET KIN+CKTOT+MB+TROPI)
Relative Index: INVALID
Total CK: 47
Troponin I: 0.04

## 2010-12-04 LAB — B-NATRIURETIC PEPTIDE (CONVERTED LAB)
Pro B Natriuretic peptide (BNP): 289 — ABNORMAL HIGH
Pro B Natriuretic peptide (BNP): 829 — ABNORMAL HIGH

## 2010-12-04 LAB — DIFFERENTIAL
Eosinophils Absolute: 0.1
Eosinophils Relative: 1
Lymphs Abs: 1.5
Monocytes Relative: 7

## 2010-12-04 LAB — TROPONIN I: Troponin I: 0.04

## 2010-12-04 LAB — PROTIME-INR: INR: 1

## 2010-12-04 LAB — D-DIMER, QUANTITATIVE: D-Dimer, Quant: 0.24

## 2011-04-28 ENCOUNTER — Encounter: Payer: Self-pay | Admitting: *Deleted

## 2011-07-15 ENCOUNTER — Encounter: Payer: Self-pay | Admitting: Internal Medicine

## 2011-07-15 ENCOUNTER — Ambulatory Visit (INDEPENDENT_AMBULATORY_CARE_PROVIDER_SITE_OTHER): Payer: Medicare Other | Admitting: Internal Medicine

## 2011-07-15 VITALS — BP 123/85 | HR 57 | Ht 73.0 in | Wt 245.0 lb

## 2011-07-15 DIAGNOSIS — I5022 Chronic systolic (congestive) heart failure: Secondary | ICD-10-CM

## 2011-07-15 DIAGNOSIS — I472 Ventricular tachycardia, unspecified: Secondary | ICD-10-CM

## 2011-07-15 DIAGNOSIS — Z95810 Presence of automatic (implantable) cardiac defibrillator: Secondary | ICD-10-CM

## 2011-07-15 LAB — ICD DEVICE OBSERVATION
ATRIAL PACING ICD: 8.4 pct
BAMS-0001: 170 {beats}/min
RV LEAD IMPEDENCE ICD: 752 Ohm
RV LEAD THRESHOLD: 0.5 V
TZAT-0001ATACH: 2
TZAT-0001FASTVT: 1
TZAT-0002ATACH: NEGATIVE
TZAT-0002ATACH: NEGATIVE
TZAT-0005FASTVT: 88 pct
TZAT-0011FASTVT: 10 ms
TZAT-0011SLOWVT: 10 ms
TZAT-0012ATACH: 150 ms
TZAT-0012FASTVT: 200 ms
TZAT-0012SLOWVT: 200 ms
TZAT-0013FASTVT: 1
TZAT-0013SLOWVT: 2
TZAT-0018ATACH: NEGATIVE
TZAT-0018ATACH: NEGATIVE
TZAT-0018FASTVT: NEGATIVE
TZAT-0019ATACH: 6 V
TZAT-0019ATACH: 6 V
TZAT-0019ATACH: 6 V
TZAT-0019SLOWVT: 8 V
TZAT-0020ATACH: 1.5 ms
TZAT-0020ATACH: 1.5 ms
TZAT-0020SLOWVT: 1.5 ms
TZON-0003FASTVT: 240 ms
TZON-0003SLOWVT: 350 ms
TZON-0003VSLOWVT: 450 ms
TZON-0004SLOWVT: 20
TZON-0004VSLOWVT: 20
TZST-0001ATACH: 5
TZST-0001FASTVT: 2
TZST-0001FASTVT: 3
TZST-0001FASTVT: 5
TZST-0001SLOWVT: 3
TZST-0001SLOWVT: 4
TZST-0002ATACH: NEGATIVE
TZST-0003FASTVT: 35 J
TZST-0003FASTVT: 35 J
TZST-0003SLOWVT: 25 J
TZST-0003SLOWVT: 35 J
TZST-0003SLOWVT: 35 J
VENTRICULAR PACING ICD: 96.7 pct

## 2011-07-15 NOTE — Assessment & Plan Note (Signed)
He has had no recurrent ventricular arrhythmias. No change in medical therapy. 

## 2011-07-15 NOTE — Progress Notes (Signed)
HPI Don [name] returns today for followup. He is a 66 year old man with chronic class II systolic heart failure, left bundle branch block, severe left ventricular dysfunction, status post biventricular ICD. The patient has had trouble with abuse of alcohol. He has had trouble with activity. He does not like to exercise. He denies chest pain. He has class 2-3 heart failure symptoms. No syncope and no ICD shocks. No peripheral edema.    Allergies  Allergen Reactions  . Sulfa Antibiotics      Current Outpatient Prescriptions  Medication Sig Dispense Refill  . allopurinol (ZYLOPRIM) 100 MG tablet Take 100 mg by mouth 2 (two) times daily.       Marland Kitchen aspirin 81 MG tablet Take 81 mg by mouth daily.        . benazepril (LOTENSIN) 40 MG tablet Take 40 mg by mouth daily.        . carvedilol (COREG) 6.25 MG tablet Take 12.5 mg by mouth 2 (two) times daily with a meal.       . Fish Oil OIL 1,200 mg by Does not apply route 2 (two) times daily.        . furosemide (LASIX) 40 MG tablet Take 40 mg by mouth daily.        Marland Kitchen levothyroxine (SYNTHROID, LEVOTHROID) 150 MCG tablet Take 137 mcg by mouth daily.       . nitroGLYCERIN (NITROSTAT) 0.4 MG SL tablet Place 0.4 mg under the tongue every 5 (five) minutes as needed.      . simvastatin (ZOCOR) 40 MG tablet Take 40 mg by mouth at bedtime.          Past Medical History  Diagnosis Date  . Nonischemic cardiomyopathy     EF 20% s/p INSERTION OF BI-V DEFIBRILLATOR.  . Chronic systolic heart failure     LEFT  . LBBB (left bundle branch block)   . Thyroid disease     HYPOTHYROIDISM.....TREATED  . Alcohol abuse     PREVIOUS H/O DRINKING A FIFTH OF VODKA  A DAY  . Stroke     TIA's  . History of angina     ROS:   All systems reviewed and negative except as noted in the HPI.   Past Surgical History  Procedure Date  . Insert / replace / remove pacemaker     BI-V IMPLANTABLE CARDIOVERTER-DEFIBRILLATOR. ; [employer] CONCERTO; MODEL  # [id number], SERIAL #  [id number] H.  DR. Humberto Leep EDMUNDS.     Family History  Problem Relation Age of Onset  . Heart disease Mother   . Heart disease Father   . Cancer    . Hypertension       History   Social History  . Marital Status: Married    Spouse Name: N/A    Number of Children: N/A  . Years of Education: N/A   Occupational History  . RETIRED     SOCIAL WORKER   Social History Main Topics  . Smoking status: Current Everyday Smoker  . Smokeless tobacco: Not on file  . Alcohol Use: No     H/O ALCOHOLISM PERVIOUSLY A FIFTH OF VODKA A DAY  . Drug Use: No  . Sexually Active:    Other Topics Concern  . Not on file   Social History Narrative   BI-V IMPLANTABLE CARDIOVERTER-DEFIBRILLATOR; [employer]CERT[employer], MODEL # [id number]; SERIAL # [id number] H; RETIRED SOCIAL WORKERMARRIED 4 TIMESH/O ALCOHOLISM; A FIFTH OF VODKA DAILY IN THE PAST     BP 123/85  Pulse  57  Ht 6\' 1"  (1.854 m)  Wt 245 lb (111.131 kg)  BMI 32.32 kg/m2  Physical Exam:  Well appearing 66 year old man, NAD HEENT: Unremarkable Neck:  No JVD, no thyromegally Lungs:  Clear with no wheezes, rales, or rhonchi. HEART:  Regular rate rhythm, no murmurs, no rubs, no clicks Abd:  soft, positive bowel sounds, no organomegally, no rebound, no guarding Ext:  2 plus pulses, no edema, no cyanosis, no clubbing Skin:  No rashes no nodules Neuro:  CN II through XII intact, motor grossly intact  DEVICE  Normal device function.  See PaceArt for details. Approaching ERI.  Assess/Plan:

## 2011-07-15 NOTE — Patient Instructions (Signed)
Remote monitoring is used to monitor your Pacemaker of ICD from home. This monitoring reduces the number of office visits required to check your device to one time per year. It allows [place] to keep an eye on the functioning of your device to ensure it is working properly. You are scheduled for a device check from home on October 23, 2011. You may send your transmission at any time that day. If you have a wireless device, the transmission will be sent automatically. After your physician reviews your transmission, you will receive a postcard with your next transmission date.  Your physician wants you to follow-up in: 1 year with Dr Ladona Ridgel.  You will receive a reminder letter in the mail two months in advance. If you don't receive a letter, please call our office to schedule the follow-up appointment.

## 2011-07-15 NOTE — Assessment & Plan Note (Signed)
His heart failure symptoms are class 2-3, closer to class III. He remains sedentary. I've encouraged the patient to increase his physical activity. He is instructed to maintain a low-sodium diet and to stop drinking alcohol completely.

## 2011-07-15 NOTE — Assessment & Plan Note (Signed)
His device is working normally. He is approaching elective replacement. We'll see him back in several months.

## 2011-11-18 ENCOUNTER — Encounter: Payer: Self-pay | Admitting: Internal Medicine

## 2011-11-18 ENCOUNTER — Ambulatory Visit (INDEPENDENT_AMBULATORY_CARE_PROVIDER_SITE_OTHER): Payer: Medicare Other | Admitting: *Deleted

## 2011-11-18 DIAGNOSIS — I5022 Chronic systolic (congestive) heart failure: Secondary | ICD-10-CM

## 2011-11-18 DIAGNOSIS — I472 Ventricular tachycardia, unspecified: Secondary | ICD-10-CM

## 2011-11-18 DIAGNOSIS — Z95810 Presence of automatic (implantable) cardiac defibrillator: Secondary | ICD-10-CM

## 2011-11-21 LAB — REMOTE ICD DEVICE
AL AMPLITUDE: 2.4 mv
ATRIAL PACING ICD: 7.98 pct
BAMS-0001: 170 {beats}/min
BRDY-0002LV: 60 {beats}/min
LV LEAD IMPEDENCE ICD: 592 Ohm
LV LEAD THRESHOLD: 1.5 V
RV LEAD IMPEDENCE ICD: 688 Ohm
TOT-0001: 1
TOT-0006: 20080710000000
TZAT-0001ATACH: 2
TZAT-0001ATACH: 3
TZAT-0002ATACH: NEGATIVE
TZAT-0002ATACH: NEGATIVE
TZAT-0004FASTVT: 8
TZAT-0005FASTVT: 88 pct
TZAT-0011SLOWVT: 10 ms
TZAT-0012FASTVT: 200 ms
TZAT-0012SLOWVT: 200 ms
TZAT-0013FASTVT: 1
TZAT-0018ATACH: NEGATIVE
TZAT-0018ATACH: NEGATIVE
TZAT-0019ATACH: 6 V
TZAT-0019ATACH: 6 V
TZAT-0019SLOWVT: 8 V
TZON-0003SLOWVT: 350 ms
TZON-0003VSLOWVT: 450 ms
TZON-0004VSLOWVT: 20
TZST-0001ATACH: 5
TZST-0001ATACH: 6
TZST-0001FASTVT: 3
TZST-0001FASTVT: 5
TZST-0001SLOWVT: 3
TZST-0001SLOWVT: 5
TZST-0002ATACH: NEGATIVE
TZST-0003FASTVT: 35 J
TZST-0003FASTVT: 35 J
TZST-0003FASTVT: 35 J
TZST-0003SLOWVT: 25 J
TZST-0003SLOWVT: 35 J
VF: 0

## 2011-11-26 ENCOUNTER — Encounter: Payer: Self-pay | Admitting: *Deleted

## 2011-11-26 ENCOUNTER — Encounter: Payer: Self-pay | Admitting: Internal Medicine

## 2011-11-26 ENCOUNTER — Ambulatory Visit (INDEPENDENT_AMBULATORY_CARE_PROVIDER_SITE_OTHER): Payer: Medicare Other | Admitting: Internal Medicine

## 2011-11-26 VITALS — BP 108/70 | HR 68 | Ht 73.0 in | Wt 245.0 lb

## 2011-11-26 DIAGNOSIS — I472 Ventricular tachycardia: Secondary | ICD-10-CM

## 2011-11-26 DIAGNOSIS — I509 Heart failure, unspecified: Secondary | ICD-10-CM

## 2011-11-26 DIAGNOSIS — Z95810 Presence of automatic (implantable) cardiac defibrillator: Secondary | ICD-10-CM

## 2011-11-26 DIAGNOSIS — I5022 Chronic systolic (congestive) heart failure: Secondary | ICD-10-CM

## 2011-11-26 NOTE — Assessment & Plan Note (Signed)
His symptoms are currently class II. He'll continue his current medical therapy and maintain a low-sodium diet. 

## 2011-11-26 NOTE — Assessment & Plan Note (Signed)
His Medtronic biventricular ICD is working normally but at elective replacement. We'll schedule ICD generator change in the next few weeks.

## 2011-11-26 NOTE — Progress Notes (Signed)
HPI Mr. Mikaelian returns today for followup. He is a very pleasant 66-year-old man with a history of chronic systolic heart failure, status post biventricular ICD implantation. In the interim, he has been stable. His heart failure is class II. He denies peripheral edema. No recent ICD shocks. He has reached elective replacement.  Allergies  Allergen Reactions  . Sulfa Antibiotics      Current Outpatient Prescriptions  Medication Sig Dispense Refill  . allopurinol (ZYLOPRIM) 100 MG tablet Take 100 mg by mouth 2 (two) times daily.       . aspirin 81 MG tablet Take 81 mg by mouth daily.        . atorvastatin (LIPITOR) 40 MG tablet 40 mg daily.       . benazepril (LOTENSIN) 40 MG tablet Take 40 mg by mouth daily.        . carvedilol (COREG) 6.25 MG tablet Take 12.5 mg by mouth 2 (two) times daily with a meal.       . furosemide (LASIX) 40 MG tablet Take 40 mg by mouth daily.        . levothyroxine (SYNTHROID, LEVOTHROID) 150 MCG tablet Take 137 mcg by mouth daily.       . nitroGLYCERIN (NITROSTAT) 0.4 MG SL tablet Place 0.4 mg under the tongue every 5 (five) minutes as needed.         Past Medical History  Diagnosis Date  . Nonischemic cardiomyopathy     EF 20% s/p INSERTION OF BI-V DEFIBRILLATOR.  . Chronic systolic heart failure     LEFT  . LBBB (left bundle branch block)   . Thyroid disease     HYPOTHYROIDISM.....TREATED  . Alcohol abuse     PREVIOUS H/O DRINKING A FIFTH OF VODKA  A DAY  . Stroke     TIA's  . History of angina     ROS:   All systems reviewed and negative except as noted in the HPI.   Past Surgical History  Procedure Date  . Insert / replace / remove pacemaker     BI-V IMPLANTABLE CARDIOVERTER-DEFIBRILLATOR. ; [employer] CONCERTO; MODEL  # [id number], SERIAL # [id number].  DR. JOHN H. EDMUNDS.     Family History  Problem Relation Age of Onset  . Heart disease Mother   . Heart disease Father   . Cancer    . Hypertension       History   Social  History  . Marital Status: Married    Spouse Name: N/A    Number of Children: N/A  . Years of Education: N/A   Occupational History  . RETIRED     SOCIAL WORKER   Social History Main Topics  . Smoking status: Current Every Day Smoker  . Smokeless tobacco: Not on file  . Alcohol Use: No     H/O ALCOHOLISM PERVIOUSLY A FIFTH OF VODKA A DAY  . Drug Use: No  . Sexually Active:    Other Topics Concern  . Not on file   Social History Narrative   BI-V IMPLANTABLE CARDIOVERTER-DEFIBRILLATOR; [employer] CONCERTO, MODEL # [id number]; SERIAL # [id number]; RETIRED SOCIAL WORKERMARRIED 4 TIMESH/O ALCOHOLISM; A FIFTH OF VODKA DAILY IN THE PAST     BP 108/70  Pulse 68  Ht 6' 1" (1.854 m)  Wt 245 lb (111.131 kg)  BMI 32.32 kg/m2  Physical Exam:  Well appearing 66-year-old man, NAD HEENT: Unremarkable Neck:  7 cm JVD, no thyromegally Lungs:  Clear with rales in the bases bilaterally. No wheezes   or rhonchi. No increased work of breathing. HEART:  Regular rate rhythm, no murmurs, no rubs, no clicks Abd:  soft, positive bowel sounds, no organomegally, no rebound, no guarding Ext:  2 plus pulses, no edema, no cyanosis, no clubbing Skin:  No rashes no nodules Neuro:  CN II through XII intact, motor grossly intact  DEVICE  Normal device function.  See PaceArt for details. Device is at elective replacement   Assess/Plan:  

## 2011-11-26 NOTE — Patient Instructions (Addendum)
Schedule for ICD generator change  See instruction sheet

## 2011-11-28 ENCOUNTER — Telehealth: Payer: Self-pay | Admitting: Internal Medicine

## 2011-11-28 NOTE — Telephone Encounter (Signed)
New Problem:    Patient called in wanting to reschedule his surgical procedure on 12/25/11.  Please call back.

## 2011-11-28 NOTE — Telephone Encounter (Signed)
lmom for pt  His procedure is scheduled for 12/25/11 at 10:30am  Be at Short Stay at [facility] at 8:30am Labs on 12/18/11

## 2011-12-18 ENCOUNTER — Other Ambulatory Visit (INDEPENDENT_AMBULATORY_CARE_PROVIDER_SITE_OTHER): Payer: Medicare Other

## 2011-12-18 DIAGNOSIS — I472 Ventricular tachycardia: Secondary | ICD-10-CM

## 2011-12-18 DIAGNOSIS — I509 Heart failure, unspecified: Secondary | ICD-10-CM

## 2011-12-18 LAB — CBC WITH DIFFERENTIAL/PLATELET
Basophils Relative: 0.9 % (ref 0.0–3.0)
Eosinophils Relative: 2 % (ref 0.0–5.0)
Lymphocytes Relative: 20.6 % (ref 12.0–46.0)
Monocytes Absolute: 0.7 10*3/uL (ref 0.1–1.0)
Monocytes Relative: 10.4 % (ref 3.0–12.0)
Neutrophils Relative %: 66.1 % (ref 43.0–77.0)
Platelets: 187 10*3/uL (ref 150.0–400.0)
RBC: 4.12 Mil/uL — ABNORMAL LOW (ref 4.22–5.81)
WBC: 6.8 10*3/uL (ref 4.5–10.5)

## 2011-12-18 LAB — BASIC METABOLIC PANEL
BUN: 15 mg/dL (ref 6–23)
CO2: 29 mEq/L (ref 19–32)
Chloride: 107 mEq/L (ref 96–112)
GFR: 79.48 mL/min (ref >60.00)
Glucose, Bld: 100 mg/dL — ABNORMAL HIGH (ref 70–99)
Potassium: 3.9 mEq/L (ref 3.5–5.1)
Sodium: 142 mEq/L (ref 135–145)

## 2011-12-19 ENCOUNTER — Other Ambulatory Visit: Payer: Self-pay

## 2011-12-19 DIAGNOSIS — I5022 Chronic systolic (congestive) heart failure: Secondary | ICD-10-CM

## 2011-12-22 ENCOUNTER — Other Ambulatory Visit (INDEPENDENT_AMBULATORY_CARE_PROVIDER_SITE_OTHER): Payer: Medicare Other

## 2011-12-22 DIAGNOSIS — I5022 Chronic systolic (congestive) heart failure: Secondary | ICD-10-CM

## 2011-12-22 LAB — BASIC METABOLIC PANEL
CO2: 29 mEq/L (ref 19–32)
Chloride: 103 mEq/L (ref 96–112)
Creatinine, Ser: 0.9 mg/dL (ref 0.4–1.5)
Glucose, Bld: 89 mg/dL (ref 70–99)

## 2011-12-23 ENCOUNTER — Telehealth: Payer: Self-pay | Admitting: Internal Medicine

## 2011-12-23 NOTE — Telephone Encounter (Signed)
Advised pt on times.

## 2011-12-23 NOTE — Telephone Encounter (Signed)
Pt calling to see how long he will be a [place] for his procedure, pls call , trying to arrange a ride

## 2011-12-24 MED ORDER — SODIUM CHLORIDE 0.9 % IR SOLN
80.0000 mg | Status: DC
  Filled 2011-12-24: qty 2

## 2011-12-24 MED ORDER — DEXTROSE 5 % IV SOLN
3.0000 g | INTRAVENOUS | Status: DC
  Filled 2011-12-24: qty 3000

## 2011-12-25 ENCOUNTER — Ambulatory Visit (HOSPITAL_COMMUNITY)
Admission: RE | Admit: 2011-12-25 | Discharge: 2011-12-25 | Disposition: A | Discharge status: Home / Self Care | Payer: Medicare Other | Source: Ambulatory Visit | Attending: Internal Medicine | Admitting: Internal Medicine

## 2011-12-25 ENCOUNTER — Encounter (HOSPITAL_COMMUNITY)
Admission: RE | Disposition: A | Discharge status: Home / Self Care | Payer: Self-pay | Source: Ambulatory Visit | Attending: Internal Medicine

## 2011-12-25 ENCOUNTER — Ambulatory Visit (HOSPITAL_COMMUNITY): Payer: Medicare Other

## 2011-12-25 DIAGNOSIS — I5022 Chronic systolic (congestive) heart failure: Secondary | ICD-10-CM | POA: Insufficient documentation

## 2011-12-25 DIAGNOSIS — I472 Ventricular tachycardia: Secondary | ICD-10-CM

## 2011-12-25 DIAGNOSIS — Z4502 Encounter for adjustment and management of automatic implantable cardiac defibrillator: Secondary | ICD-10-CM | POA: Insufficient documentation

## 2011-12-25 DIAGNOSIS — I447 Left bundle-branch block, unspecified: Secondary | ICD-10-CM

## 2011-12-25 DIAGNOSIS — I509 Heart failure, unspecified: Secondary | ICD-10-CM

## 2011-12-25 HISTORY — PX: IMPLANTABLE CARDIOVERTER DEFIBRILLATOR (ICD) GENERATOR CHANGE: SHX5469

## 2011-12-25 LAB — SURGICAL PCR SCREEN: Staphylococcus aureus: NEGATIVE

## 2011-12-25 SURGERY — ICD GENERATOR CHANGE
Anesthesia: LOCAL | Laterality: Bilateral

## 2011-12-25 MED ORDER — MIDAZOLAM HCL 5 MG/5ML IJ SOLN
INTRAMUSCULAR | Status: AC
  Filled 2011-12-25: qty 5

## 2011-12-25 MED ORDER — CHLORHEXIDINE GLUCONATE 4 % EX LIQD
60.0000 mL | Freq: Once | CUTANEOUS | Status: DC
  Filled 2011-12-25: qty 60

## 2011-12-25 MED ORDER — LIDOCAINE HCL (PF) 1 % IJ SOLN
INTRAMUSCULAR | Status: AC
  Filled 2011-12-25: qty 60

## 2011-12-25 MED ORDER — MUPIROCIN 2 % EX OINT
TOPICAL_OINTMENT | Freq: Two times a day (BID) | CUTANEOUS | Status: DC
  Administered 2011-12-25: 09:00:00 via NASAL
  Filled 2011-12-25: qty 22

## 2011-12-25 MED ORDER — FENTANYL CITRATE 0.05 MG/ML IJ SOLN
INTRAMUSCULAR | Status: AC
  Filled 2011-12-25: qty 2

## 2011-12-25 MED ORDER — MUPIROCIN 2 % EX OINT
TOPICAL_OINTMENT | CUTANEOUS | Status: AC
  Filled 2011-12-25: qty 22

## 2011-12-25 MED ORDER — CEFAZOLIN SODIUM-DEXTROSE 2-3 GM-% IV SOLR
INTRAVENOUS | Status: AC
  Filled 2011-12-25: qty 50

## 2011-12-25 MED ORDER — SODIUM CHLORIDE 0.45 % IV SOLN
INTRAVENOUS | Status: DC
  Administered 2011-12-25: 10:00:00 via INTRAVENOUS

## 2011-12-25 NOTE — Interval H&P Note (Signed)
History and Physical Interval Note:  12/25/2011 9:56 AM  Don [name]  has presented today for surgery, with the diagnosis of eol  The various methods of treatment have been discussed with the patient and family. After consideration of risks, benefits and other options for treatment, the patient has consented to  Procedure(s) (LRB) with comments: ICD GENERATOR CHANGE (Bilateral) as a surgical intervention .  The patient's history has been reviewed, patient examined, no change in status, stable for surgery.  I have reviewed the patient's chart and labs.  Questions were answered to the patient's satisfaction.     Leonia Reeves.D.

## 2011-12-25 NOTE — Progress Notes (Signed)
Discharged home; left chest dressing CDI; no C/O; follow-up for wound check in [facility] 12/31/11 at 1200

## 2011-12-25 NOTE — Op Note (Signed)
ICD generator removal and insertion of a new device without immediate complication. W#[id number].

## 2011-12-25 NOTE — H&P (View-Only) (Signed)
HPI [name] returns today for followup. He is a very pleasant 66 year old man with a history of chronic systolic heart failure, status post biventricular ICD implantation. In the interim, he has been stable. His heart failure is class II. He denies peripheral edema. No recent ICD shocks. He has reached elective replacement.  Allergies  Allergen Reactions  . Sulfa Antibiotics      Current Outpatient Prescriptions  Medication Sig Dispense Refill  . allopurinol (ZYLOPRIM) 100 MG tablet Take 100 mg by mouth 2 (two) times daily.       Marland Kitchen aspirin 81 MG tablet Take 81 mg by mouth daily.        Marland Kitchen atorvastatin (LIPITOR) 40 MG tablet 40 mg daily.       . benazepril (LOTENSIN) 40 MG tablet Take 40 mg by mouth daily.        . carvedilol (COREG) 6.25 MG tablet Take 12.5 mg by mouth 2 (two) times daily with a meal.       . furosemide (LASIX) 40 MG tablet Take 40 mg by mouth daily.        Marland Kitchen levothyroxine (SYNTHROID, LEVOTHROID) 150 MCG tablet Take 137 mcg by mouth daily.       . nitroGLYCERIN (NITROSTAT) 0.4 MG SL tablet Place 0.4 mg under the tongue every 5 (five) minutes as needed.         Past Medical History  Diagnosis Date  . Nonischemic cardiomyopathy     EF 20% s/p INSERTION OF BI-V DEFIBRILLATOR.  . Chronic systolic heart failure     LEFT  . LBBB (left bundle branch block)   . Thyroid disease     HYPOTHYROIDISM.....TREATED  . Alcohol abuse     PREVIOUS H/O DRINKING A FIFTH OF VODKA  A DAY  . Stroke     TIA's  . History of angina     ROS:   All systems reviewed and negative except as noted in the HPI.   Past Surgical History  Procedure Date  . Insert / replace / remove pacemaker     BI-V IMPLANTABLE CARDIOVERTER-DEFIBRILLATOR. ; [employer] CONCERTO; MODEL  # [id number], SERIAL # [id number] H.  DR. Humberto Leep EDMUNDS.     Family History  Problem Relation Age of Onset  . Heart disease Mother   . Heart disease Father   . Cancer    . Hypertension       History   Social  History  . Marital Status: Married    Spouse Name: N/A    Number of Children: N/A  . Years of Education: N/A   Occupational History  . RETIRED     SOCIAL WORKER   Social History Main Topics  . Smoking status: Current Every Day Smoker  . Smokeless tobacco: Not on file  . Alcohol Use: No     H/O ALCOHOLISM PERVIOUSLY A FIFTH OF VODKA A DAY  . Drug Use: No  . Sexually Active:    Other Topics Concern  . Not on file   Social History Narrative   BI-V IMPLANTABLE CARDIOVERTER-DEFIBRILLATOR; [employer] CONCERTO, MODEL # [id number]; SERIAL # [id number]; RETIRED SOCIAL WORKERMARRIED 4 TIMESH/O ALCOHOLISM; A FIFTH OF VODKA DAILY IN THE PAST     BP 108/70  Pulse 68  Ht 6\' 1"  (1.854 m)  Wt 245 lb (111.131 kg)  BMI 32.32 kg/m2  Physical Exam:  Well appearing 66 year old man, NAD HEENT: Unremarkable Neck:  7 cm JVD, no thyromegally Lungs:  Clear with rales in the bases bilaterally. No wheezes  or rhonchi. No increased work of breathing. HEART:  Regular rate rhythm, no murmurs, no rubs, no clicks Abd:  soft, positive bowel sounds, no organomegally, no rebound, no guarding Ext:  2 plus pulses, no edema, no cyanosis, no clubbing Skin:  No rashes no nodules Neuro:  CN II through XII intact, motor grossly intact  DEVICE  Normal device function.  See PaceArt for details. Device is at elective replacement   Assess/Plan:

## 2011-12-26 NOTE — Op Note (Signed)
NAMEMarland Kitchen  DONJUAN, ROBISON NO.:  [id number]  MEDICAL RECORD NO.:  [id number]  LOCATION:  [facility]                         FACILITY:  [facility]  PHYSICIAN:  Doylene Canning. Ladona Ridgel, MD    DATE OF BIRTH:  1945-09-11  DATE OF PROCEDURE:  12/25/2011 DATE OF DISCHARGE:  12/25/2011                              OPERATIVE REPORT   PROCEDURE PERFORMED:  Removal of previously implanted BiV ICD and insertion of a new BiV ICD.  INDICATION:  Status post ICD insertion with device at elective replacement.  INTRODUCTION:  The patient is a 66 year old male with longstanding chronic systolic heart failure and left bundle-branch block status post ICD insertion.  He has reached device elective replacement.  He is now referred for insertion of a new device.  PROCEDURE:  After informed consent was obtained, the patient was taken to the diagnostic EP lab in a fasting state.  After usual preparation and draping, intravenous fentanyl and midazolam was given for sedation. A 30 mL of lidocaine was infiltrated into the left infraclavicular region and a 5-cm incision was carried out over this region. Electrocautery was utilized to dissect down to the ICD pocket. Electrocautery was utilized to free up the dense fibrous adhesions and the generator was removed with gentle traction.  The leads were evaluated and found to be working satisfactorily.  P-waves were 3, R- waves were 5, the impedance 456 in A, 703 in RV and 530 in LV.  The thresholds were all a volt or less.  With these satisfactory parameters, the new Medtronic BiV ICD, serial #[id number] was connected to the atrial RV and LV leads and placed back in the subcutaneous pocket.  The pocket was irrigated with antibiotic irrigation and the incision was closed with 2-0 and 3-0 Vicryl.  At this point, I scrubbed out of the case to supervise defibrillation threshold testing.  After the patient more deeply sedated with intravenous fentanyl and Versed under  my direct supervision, ventricular fibrillation was induced with a T-wave shock.  A 20-joule shock was then delivered which terminated ventricular fibrillation and restored sinus rhythm.  At this point, Benzoin and Steri-Strips were painted on the skin, a pressure dressing applied, and the patient returned to his room in satisfactory condition.  COMPLICATIONS:  There were no immediate procedure complications.  RESULTS:  Demonstrate successful implantation of a Medtronic BiV ICD after successful removal of previously implanted device which had reached elective replacement with successful defibrillation threshold testing.     Doylene Canning. Ladona Ridgel, MD    GWT/MEDQ  D:  12/25/2011  T:  12/26/2011  Job:  [id number]

## 2012-01-01 ENCOUNTER — Ambulatory Visit (INDEPENDENT_AMBULATORY_CARE_PROVIDER_SITE_OTHER): Payer: Medicare Other | Admitting: *Deleted

## 2012-01-01 DIAGNOSIS — I5022 Chronic systolic (congestive) heart failure: Secondary | ICD-10-CM

## 2012-01-01 DIAGNOSIS — I472 Ventricular tachycardia: Secondary | ICD-10-CM

## 2012-01-01 LAB — ICD DEVICE OBSERVATION
AL AMPLITUDE: 2.25 mv
AL IMPEDENCE ICD: 475 Ohm
AL THRESHOLD: 0.75 V
ATRIAL PACING ICD: 1.32 pct
BAMS-0001: 170 {beats}/min
BATTERY VOLTAGE: 3.1866 V
CHARGE TIME: 3.483 s
FVT: 0
LV LEAD IMPEDENCE ICD: 551 Ohm
LV LEAD THRESHOLD: 0.875 V
PACEART VT: 0
RV LEAD AMPLITUDE: 5 mv
RV LEAD IMPEDENCE ICD: 703 Ohm
RV LEAD THRESHOLD: 0.625 V
TOT-0001: 1
TOT-0002: 0
TOT-0006: 20131107000000
TZAT-0001ATACH: 1
TZAT-0001ATACH: 2
TZAT-0001ATACH: 3
TZAT-0001FASTVT: 1
TZAT-0001SLOWVT: 1
TZAT-0002ATACH: NEGATIVE
TZAT-0002ATACH: NEGATIVE
TZAT-0002ATACH: NEGATIVE
TZAT-0004FASTVT: 8
TZAT-0004SLOWVT: 8
TZAT-0005FASTVT: 88 pct
TZAT-0005SLOWVT: 88 pct
TZAT-0011FASTVT: 10 ms
TZAT-0011SLOWVT: 10 ms
TZAT-0012ATACH: 150 ms
TZAT-0012ATACH: 150 ms
TZAT-0012ATACH: 150 ms
TZAT-0012FASTVT: 170 ms
TZAT-0012SLOWVT: 170 ms
TZAT-0013FASTVT: 1
TZAT-0013SLOWVT: 2
TZAT-0018ATACH: NEGATIVE
TZAT-0018ATACH: NEGATIVE
TZAT-0018ATACH: NEGATIVE
TZAT-0018FASTVT: NEGATIVE
TZAT-0018SLOWVT: NEGATIVE
TZAT-0019ATACH: 6 V
TZAT-0019ATACH: 6 V
TZAT-0019ATACH: 6 V
TZAT-0019FASTVT: 8 V
TZAT-0019SLOWVT: 8 V
TZAT-0020ATACH: 1.5 ms
TZAT-0020ATACH: 1.5 ms
TZAT-0020ATACH: 1.5 ms
TZAT-0020FASTVT: 1.5 ms
TZAT-0020SLOWVT: 1.5 ms
TZON-0003ATACH: 350 ms
TZON-0003FASTVT: 240 ms
TZON-0003SLOWVT: 350 ms
TZON-0003VSLOWVT: 450 ms
TZON-0004SLOWVT: 16
TZON-0004VSLOWVT: 20
TZON-0005SLOWVT: 12
TZST-0001ATACH: 4
TZST-0001ATACH: 5
TZST-0001ATACH: 6
TZST-0001FASTVT: 2
TZST-0001FASTVT: 3
TZST-0001FASTVT: 4
TZST-0001FASTVT: 5
TZST-0001FASTVT: 6
TZST-0001SLOWVT: 2
TZST-0001SLOWVT: 3
TZST-0001SLOWVT: 4
TZST-0001SLOWVT: 5
TZST-0001SLOWVT: 6
TZST-0002ATACH: NEGATIVE
TZST-0002ATACH: NEGATIVE
TZST-0002ATACH: NEGATIVE
TZST-0003FASTVT: 25 J
TZST-0003FASTVT: 35 J
TZST-0003FASTVT: 35 J
TZST-0003FASTVT: 35 J
TZST-0003FASTVT: 35 J
TZST-0003SLOWVT: 25 J
TZST-0003SLOWVT: 35 J
TZST-0003SLOWVT: 35 J
TZST-0003SLOWVT: 35 J
TZST-0003SLOWVT: 35 J
VENTRICULAR PACING ICD: 95.05 pct
VF: 0

## 2012-01-01 NOTE — Patient Instructions (Addendum)
Return visit 04/06/12 @ 10:30am with Dr. Ladona Ridgel.

## 2012-01-01 NOTE — Progress Notes (Signed)
Wound check ICD change out 

## 2012-01-20 ENCOUNTER — Encounter: Payer: Self-pay | Admitting: Internal Medicine

## 2012-04-06 ENCOUNTER — Encounter: Payer: Medicare Other | Admitting: Internal Medicine

## 2012-04-26 ENCOUNTER — Inpatient Hospital Stay (HOSPITAL_COMMUNITY)
Admission: EM | Admit: 2012-04-26 | Discharge: 2012-05-01 | DRG: 682 | Disposition: A | Discharge status: Home / Self Care | Payer: Medicare Other | Attending: Internal Medicine | Admitting: Internal Medicine

## 2012-04-26 ENCOUNTER — Encounter (HOSPITAL_COMMUNITY): Payer: Self-pay | Admitting: *Deleted

## 2012-04-26 ENCOUNTER — Emergency Department (HOSPITAL_COMMUNITY): Payer: Medicare Other

## 2012-04-26 ENCOUNTER — Inpatient Hospital Stay (HOSPITAL_COMMUNITY): Payer: Medicare Other

## 2012-04-26 DIAGNOSIS — Z79899 Other long term (current) drug therapy: Secondary | ICD-10-CM

## 2012-04-26 DIAGNOSIS — I472 Ventricular tachycardia, unspecified: Secondary | ICD-10-CM | POA: Diagnosis present

## 2012-04-26 DIAGNOSIS — E872 Acidosis, unspecified: Secondary | ICD-10-CM | POA: Diagnosis present

## 2012-04-26 DIAGNOSIS — I447 Left bundle-branch block, unspecified: Secondary | ICD-10-CM | POA: Diagnosis present

## 2012-04-26 DIAGNOSIS — I639 Cerebral infarction, unspecified: Secondary | ICD-10-CM | POA: Diagnosis present

## 2012-04-26 DIAGNOSIS — Z87891 Personal history of nicotine dependence: Secondary | ICD-10-CM

## 2012-04-26 DIAGNOSIS — I959 Hypotension, unspecified: Secondary | ICD-10-CM

## 2012-04-26 DIAGNOSIS — R197 Diarrhea, unspecified: Secondary | ICD-10-CM | POA: Diagnosis present

## 2012-04-26 DIAGNOSIS — N179 Acute kidney failure, unspecified: Principal | ICD-10-CM | POA: Diagnosis present

## 2012-04-26 DIAGNOSIS — K644 Residual hemorrhoidal skin tags: Secondary | ICD-10-CM | POA: Diagnosis present

## 2012-04-26 DIAGNOSIS — E860 Dehydration: Secondary | ICD-10-CM

## 2012-04-26 DIAGNOSIS — R634 Abnormal weight loss: Secondary | ICD-10-CM | POA: Diagnosis present

## 2012-04-26 DIAGNOSIS — S2231XA Fracture of one rib, right side, initial encounter for closed fracture: Secondary | ICD-10-CM

## 2012-04-26 DIAGNOSIS — Z23 Encounter for immunization: Secondary | ICD-10-CM

## 2012-04-26 DIAGNOSIS — Z8673 Personal history of transient ischemic attack (TIA), and cerebral infarction without residual deficits: Secondary | ICD-10-CM

## 2012-04-26 DIAGNOSIS — W19XXXA Unspecified fall, initial encounter: Secondary | ICD-10-CM

## 2012-04-26 DIAGNOSIS — I509 Heart failure, unspecified: Secondary | ICD-10-CM | POA: Diagnosis present

## 2012-04-26 DIAGNOSIS — IMO0001 Reserved for inherently not codable concepts without codable children: Secondary | ICD-10-CM

## 2012-04-26 DIAGNOSIS — I5022 Chronic systolic (congestive) heart failure: Secondary | ICD-10-CM | POA: Diagnosis present

## 2012-04-26 DIAGNOSIS — I428 Other cardiomyopathies: Secondary | ICD-10-CM | POA: Diagnosis present

## 2012-04-26 DIAGNOSIS — F1010 Alcohol abuse, uncomplicated: Secondary | ICD-10-CM | POA: Insufficient documentation

## 2012-04-26 DIAGNOSIS — R578 Other shock: Secondary | ICD-10-CM | POA: Diagnosis present

## 2012-04-26 DIAGNOSIS — K625 Hemorrhage of anus and rectum: Secondary | ICD-10-CM

## 2012-04-26 DIAGNOSIS — I4729 Other ventricular tachycardia: Secondary | ICD-10-CM | POA: Diagnosis present

## 2012-04-26 DIAGNOSIS — Z9181 History of falling: Secondary | ICD-10-CM

## 2012-04-26 DIAGNOSIS — E890 Postprocedural hypothyroidism: Secondary | ICD-10-CM | POA: Diagnosis present

## 2012-04-26 DIAGNOSIS — E876 Hypokalemia: Secondary | ICD-10-CM | POA: Diagnosis present

## 2012-04-26 DIAGNOSIS — Z95810 Presence of automatic (implantable) cardiac defibrillator: Secondary | ICD-10-CM

## 2012-04-26 DIAGNOSIS — E079 Disorder of thyroid, unspecified: Secondary | ICD-10-CM

## 2012-04-26 DIAGNOSIS — Z7982 Long term (current) use of aspirin: Secondary | ICD-10-CM

## 2012-04-26 DIAGNOSIS — F1011 Alcohol abuse, in remission: Secondary | ICD-10-CM | POA: Diagnosis present

## 2012-04-26 DIAGNOSIS — R571 Hypovolemic shock: Secondary | ICD-10-CM | POA: Diagnosis present

## 2012-04-26 HISTORY — DX: Chronic kidney disease, unspecified: N18.9

## 2012-04-26 HISTORY — DX: Unspecified osteoarthritis, unspecified site: M19.90

## 2012-04-26 HISTORY — DX: Presence of automatic (implantable) cardiac defibrillator: Z95.810

## 2012-04-26 HISTORY — DX: Angina pectoris, unspecified: I20.9

## 2012-04-26 LAB — DIFFERENTIAL
Basophils Relative: 0 % (ref 0–1)
Eosinophils Absolute: 0.1 10*3/uL (ref 0.0–0.7)
Eosinophils Relative: 2 % (ref 0–5)
Lymphs Abs: 1.6 10*3/uL (ref 0.7–4.0)
Monocytes Relative: 11 % (ref 3–12)
Neutrophils Relative %: 65 % (ref 43–77)

## 2012-04-26 LAB — URINALYSIS, ROUTINE W REFLEX MICROSCOPIC
Glucose, UA: NEGATIVE mg/dL
Leukocytes, UA: NEGATIVE
Nitrite: NEGATIVE
Protein, ur: NEGATIVE mg/dL
pH: 5 (ref 5.0–8.0)

## 2012-04-26 LAB — COMPREHENSIVE METABOLIC PANEL
ALT: 10 U/L (ref 0–53)
AST: 14 U/L (ref 0–37)
Albumin: 3 g/dL — ABNORMAL LOW (ref 3.5–5.2)
Alkaline Phosphatase: 62 U/L (ref 39–117)
BUN: 41 mg/dL — ABNORMAL HIGH (ref 6–23)
CO2: 20 mEq/L (ref 19–32)
Calcium: 7.9 mg/dL — ABNORMAL LOW (ref 8.4–10.5)
Chloride: 101 mEq/L (ref 96–112)
Creatinine, Ser: 8.48 mg/dL — ABNORMAL HIGH (ref 0.50–1.35)
GFR calc Af Amer: 7 mL/min — ABNORMAL LOW (ref >90)
GFR calc non Af Amer: 6 mL/min — ABNORMAL LOW (ref >90)
Glucose, Bld: 92 mg/dL (ref 70–99)
Potassium: 3.4 mEq/L — ABNORMAL LOW (ref 3.5–5.1)
Sodium: 140 mEq/L (ref 135–145)
Total Bilirubin: 0.2 mg/dL — ABNORMAL LOW (ref 0.3–1.2)
Total Protein: 6.4 g/dL (ref 6.0–8.3)

## 2012-04-26 LAB — CBC
MCH: 30.9 pg (ref 26.0–34.0)
Platelets: 219 10*3/uL (ref 150–400)
RBC: 4.43 MIL/uL (ref 4.22–5.81)
RDW: 13.6 % (ref 11.5–15.5)

## 2012-04-26 LAB — CBC WITH DIFFERENTIAL/PLATELET
Basophils Absolute: 0 10*3/uL (ref 0.0–0.1)
Basophils Relative: 0 % (ref 0–1)
Eosinophils Absolute: 0.1 10*3/uL (ref 0.0–0.7)
Eosinophils Relative: 1 % (ref 0–5)
HCT: 38.4 % — ABNORMAL LOW (ref 39.0–52.0)
Hemoglobin: 13.3 g/dL (ref 13.0–17.0)
Lymphocytes Relative: 17 % (ref 12–46)
Lymphs Abs: 1.5 10*3/uL (ref 0.7–4.0)
MCH: 30.3 pg (ref 26.0–34.0)
MCHC: 34.6 g/dL (ref 30.0–36.0)
MCV: 87.5 fL (ref 78.0–100.0)
Monocytes Absolute: 0.9 10*3/uL (ref 0.1–1.0)
Monocytes Relative: 11 % (ref 3–12)
Neutro Abs: 6 10*3/uL (ref 1.7–7.7)
Neutrophils Relative %: 71 % (ref 43–77)
Platelets: 164 10*3/uL (ref 150–400)
RBC: 4.39 MIL/uL (ref 4.22–5.81)
RDW: 13.6 % (ref 11.5–15.5)
WBC: 8.6 10*3/uL (ref 4.0–10.5)

## 2012-04-26 LAB — ABO/RH: ABO/RH(D): B POS

## 2012-04-26 LAB — SODIUM, URINE, RANDOM: Sodium, Ur: 16 mEq/L

## 2012-04-26 LAB — TYPE AND SCREEN

## 2012-04-26 MED ORDER — ACETAMINOPHEN 325 MG PO TABS
650.0000 mg | ORAL_TABLET | Freq: Four times a day (QID) | ORAL | Status: DC | PRN
  Administered 2012-04-30 – 2012-05-01 (×2): 650 mg via ORAL
  Filled 2012-04-26 (×2): qty 2

## 2012-04-26 MED ORDER — FOLIC ACID 1 MG PO TABS
1.0000 mg | ORAL_TABLET | Freq: Every day | ORAL | Status: DC
Start: 2012-04-26 — End: 2012-05-01
  Administered 2012-04-27 – 2012-05-01 (×5): 1 mg via ORAL
  Filled 2012-04-26 (×6): qty 1

## 2012-04-26 MED ORDER — METRONIDAZOLE IN NACL 5-0.79 MG/ML-% IV SOLN
500.0000 mg | Freq: Three times a day (TID) | INTRAVENOUS | Status: DC
  Administered 2012-04-26 – 2012-04-27 (×3): 500 mg via INTRAVENOUS
  Filled 2012-04-26 (×5): qty 100

## 2012-04-26 MED ORDER — ONDANSETRON HCL 4 MG/2ML IJ SOLN
4.0000 mg | Freq: Four times a day (QID) | INTRAMUSCULAR | Status: DC | PRN
  Administered 2012-04-29: 4 mg via INTRAVENOUS
  Filled 2012-04-26: qty 2

## 2012-04-26 MED ORDER — HYDROCODONE-ACETAMINOPHEN 5-325 MG PO TABS
1.0000 | ORAL_TABLET | Freq: Four times a day (QID) | ORAL | Status: DC | PRN
  Administered 2012-04-26 – 2012-04-28 (×4): 1 via ORAL
  Filled 2012-04-26 (×4): qty 1

## 2012-04-26 MED ORDER — VITAMIN B-1 100 MG PO TABS
100.0000 mg | ORAL_TABLET | Freq: Every day | ORAL | Status: DC
  Administered 2012-04-27 – 2012-05-01 (×5): 100 mg via ORAL
  Filled 2012-04-26 (×6): qty 1

## 2012-04-26 MED ORDER — SODIUM CHLORIDE 0.9 % IV BOLUS (SEPSIS)
500.0000 mL | Freq: Once | INTRAVENOUS | Status: AC
  Administered 2012-04-26: 500 mL via INTRAVENOUS

## 2012-04-26 MED ORDER — LEVOTHYROXINE SODIUM 112 MCG PO TABS
112.0000 ug | ORAL_TABLET | Freq: Every day | ORAL | Status: DC
  Administered 2012-04-27 – 2012-05-01 (×5): 112 ug via ORAL
  Filled 2012-04-26 (×7): qty 1

## 2012-04-26 MED ORDER — SODIUM CHLORIDE 0.9 % IV BOLUS (SEPSIS)
1000.0000 mL | Freq: Once | INTRAVENOUS | Status: AC
  Administered 2012-04-26: 1000 mL via INTRAVENOUS

## 2012-04-26 MED ORDER — SODIUM CHLORIDE 0.9 % IV SOLN
1000.0000 mL | INTRAVENOUS | Status: DC
  Administered 2012-04-26 (×2): 1000 mL via INTRAVENOUS

## 2012-04-26 MED ORDER — POTASSIUM CHLORIDE CRYS ER 20 MEQ PO TBCR
20.0000 meq | EXTENDED_RELEASE_TABLET | Freq: Once | ORAL | Status: AC
  Administered 2012-04-26: 20 meq via ORAL
  Filled 2012-04-26: qty 1

## 2012-04-26 MED ORDER — ACETAMINOPHEN 650 MG RE SUPP: 650.0000 mg | Freq: Four times a day (QID) | RECTAL | Status: DC | PRN

## 2012-04-26 MED ORDER — ONDANSETRON HCL 4 MG PO TABS: 4.0000 mg | ORAL_TABLET | Freq: Four times a day (QID) | ORAL | Status: DC | PRN

## 2012-04-26 MED ORDER — FENTANYL CITRATE 0.05 MG/ML IJ SOLN
100.0000 ug | Freq: Once | INTRAMUSCULAR | Status: AC
  Administered 2012-04-26: 100 ug via INTRAVENOUS
  Filled 2012-04-26: qty 2

## 2012-04-26 NOTE — ED Notes (Signed)
PT to ED via EMS, brought from office of pcp.  Pt has been feeling dizzy several days and noticing blood on tp for 1.5 weeks.  Pt also has not been experiencing normal urinary output x 3 days, despite furosemide.  Pt ao x 4.

## 2012-04-26 NOTE — Consult Note (Signed)
Reason for Consult:AKI  Referring Physician: [name] is an 67 y.o. male.  HPI: 67 yr old male with CM with EF 20%, hx bivent pacer/defib replaced x1, Graves with RAI and hypothyroid now, has TIAs without residual, CAD, ETOH abuse, over 80 pk yr smoking.  Quit smoking and drinking 2 mon ago.  Fell 2 wk ago and fx ribs and hurt back, given Meloxicam.  On Benazapril, Carvedilol, and Lasix for CHF.  D now for 2 weeks, 6-8 BM/d,loose.  4 d of feeling very bad, with no energy, heavy legs, no appetite, and quesy stomach.  Itching and some cramping.  Cr of .9 in 11/13 and now 8.48, with bicarb or 20 and K of 3.4.  No voiding x 2 d.  Sob with mild exertion, worse than usual.  Dizzy and Lightheaded with standing.   Constitutional: as above, no fevers, chills, cough, or rash.  Recent wgt loss Eyes: vision fair Ears, nose, mouth, throat, and face: negative Respiratory: negative Cardiovascular: sleeps on 4 pillows, some LE edema, no CP, usually nocturia x2.,  Gastrointestinal: as above,notes blood on tissue with wiping Genitourinary:no urine, hx stones including lithotripsy.  at least 5 occurences and 1 stent Integument/breast: red spots over past few month on abdm and chest Hematologic/lymphatic: negative Musculoskeletal:hx multiple rib fx, old and new on R.  Hx fx in foot and hand, old rib fx bilat. vert fx. 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 Neurological: weakness as above. Endocrine: negative Allergic/Immunologic: Sulfa   Past Medical History  Diagnosis Date  . Nonischemic cardiomyopathy     EF 20% s/p INSERTION OF BI-V DEFIBRILLATOR.  . Chronic systolic heart failure     LEFT  . LBBB (left bundle branch block)   . Alcohol abuse     PREVIOUS H/O DRINKING A FIFTH OF VODKA  A DAY  . History of angina   . Stroke     TIA's  . Thyroid disease     HYPOTHYROIDISM.....TREATED  . ICD (implantable cardiac defibrillator) in place     Past Surgical History  Procedure Laterality  Date  . Insert / replace / remove pacemaker      BI-V IMPLANTABLE CARDIOVERTER-DEFIBRILLATOR. ; [employer] CONCERTO; MODEL  # [id number], SERIAL # [id number] H.  DR. [name].  Marland Kitchen Cardiac catheterization      Family History  Problem Relation Age of Onset  . Heart disease Mother   . Heart disease Father   . Cancer    . Hypertension      Social History:  reports that he quit smoking about 8 weeks ago. He does not have any smokeless tobacco history on file. He reports that he does not drink alcohol or use illicit drugs.  Allergies:  Allergies  Allergen Reactions  . Sulfa Antibiotics     Medications: I have reviewed the patient's current medications. Prior to Admission:  (Not in a hospital admission)   Results for orders placed during the hospital encounter of 04/26/12 (from the past 48 hour(s))  CBC WITH DIFFERENTIAL     Status: Abnormal   Collection Time    04/26/12  3:45 PM      Result Value Range   WBC 8.6  4.0 - 10.5 K/uL   RBC 4.39  4.22 - 5.81 MIL/uL   Hemoglobin 13.3  13.0 - 17.0 g/dL   HCT 29.5 (*) 62.1 - 30.8 %   MCV 87.5  78.0 - 100.0 fL   MCH 30.3  26.0 - 34.0 pg  MCHC 34.6  30.0 - 36.0 g/dL   RDW 16.1  09.6 - 04.5 %   Platelets 164  150 - 400 K/uL   Neutrophils Relative 71  43 - 77 %   Neutro Abs 6.0  1.7 - 7.7 K/uL   Lymphocytes Relative 17  12 - 46 %   Lymphs Abs 1.5  0.7 - 4.0 K/uL   Monocytes Relative 11  3 - 12 %   Monocytes Absolute 0.9  0.1 - 1.0 K/uL   Eosinophils Relative 1  0 - 5 %   Eosinophils Absolute 0.1  0.0 - 0.7 K/uL   Basophils Relative 0  0 - 1 %   Basophils Absolute 0.0  0.0 - 0.1 K/uL  COMPREHENSIVE METABOLIC PANEL     Status: Abnormal   Collection Time    04/26/12  3:45 PM      Result Value Range   Sodium 140  135 - 145 mEq/[name]   Potassium 3.4 (*) 3.5 - 5.1 mEq/[name]   Chloride 101  96 - 112 mEq/[name]   CO2 20  19 - 32 mEq/[name]   Glucose, Bld 92  70 - 99 mg/dL   BUN 41 (*) 6 - 23 mg/dL   Creatinine, Ser 4.09 (*) 0.50 - 1.35 mg/dL    Calcium 7.9 (*) 8.4 - 10.5 mg/dL   Total Protein 6.4  6.0 - 8.3 g/dL   Albumin 3.0 (*) 3.5 - 5.2 g/dL   AST 14  0 - 37 U/[name]   ALT 10  0 - 53 U/[name]   Alkaline Phosphatase 62  39 - 117 U/[name]   Total Bilirubin 0.2 (*) 0.3 - 1.2 mg/dL   GFR calc non Af Amer 6 (*) >90 mL/min   GFR calc Af Amer 7 (*) >90 mL/min   Comment:            The eGFR has been calculated     using the CKD EPI equation.     This calculation has not been     validated in all clinical     situations.     eGFR's persistently     <90 mL/min signify     possible Chronic Kidney Disease.  APTT     Status: None   Collection Time    04/26/12  3:45 PM      Result Value Range   aPTT 32  24 - 37 seconds  PROTIME-INR     Status: None   Collection Time    04/26/12  3:45 PM      Result Value Range   Prothrombin Time 13.7  11.6 - 15.2 seconds   INR 1.06  0.00 - 1.49  TYPE AND SCREEN     Status: None   Collection Time    04/26/12  3:46 PM      Result Value Range   ABO/RH(D) B POS     Antibody Screen NEG     Sample Expiration 04/29/2012    ABO/RH     Status: None   Collection Time    04/26/12  3:46 PM      Result Value Range   ABO/RH(D) B POS      Dg Chest Portable 1 View  04/26/2012  *RADIOLOGY REPORT*  Clinical Data: Rectal bleeding, former smoking history  PORTABLE CHEST - 1 VIEW  Comparison: Chest x-ray of 04/19/2012  Findings: No active infiltrate or effusion is seen.  Mild atelectasis or scarring remains at the left lung base. Cardiomegaly is stable and  AICD lead remains.  No bony abnormality is noted.  IMPRESSION: Stable cardiomegaly with defibrillator.  No active lung disease. Probable left basilar atelectasis or scarring.   Original Report Authenticated By: Don [name], M.D.     @ROS @ Blood pressure 94/43, pulse 28, resp. rate 15, SpO2 98.00%. @PHYSEXAMBYAGE2 @ Physical Examination: General appearance - alert, well appearing, and in no distress Mental status - alert, oriented to person, place, and time Eyes -  pupils equal and reactive, extraocular eye movements intact Ears - dark [name] TM Mouth - edentulous Neck - adenopathy noted PCL Lymphatics - posterior cervical nodes Chest - clear to auscultation, no wheezes, rales or rhonchi, symmetric air entry, decreased air entry noted bilat Heart - normal rate, regular rhythm, normal S1, S2, no murmurs, rubs, clicks or gallops, systolic murmur Gr 2//6 at lower left sternal border Abdomen - bowel sounds normal hepatomegaly Liver down 4 cm Back exam - full range of motion, no tenderness, palpable spasm or pain on motion Musculoskeletal - no joint tenderness, deformity or swelling Extremities - trophic changes in feet, DP 1+ Skin - dry, campbell d Morgan spots abdm and chest  Assessment/Plan: 1 AKI most likely hemodyamic with ACEI/NSAID in setting of D and lower perfusion with blocked Autoregulation.  Mild acidemia, K ok , ^^solute and mild uremia.  If no response to fluids, RRT. R/O obstruct, IN 2 CM hold off meds and caution with fluids 3 Rectal Bleed most likely hemorrhoids 4. Hx ETOH abuse 5. Recent rib fx 6 D w/u underway 7 Hypothyroid 8 Hx TIAs 9 Arrhythmias P ivf, K, follow chem, U/S,, UA, Urine Na and K.  Don [name],Don [name] 04/26/2012, 7:16 PM

## 2012-04-26 NOTE — H&P (Signed)
Patient's PCP: [name], MD  Chief Complaint: Diarrhea, dizziness, rectal bleed.  History of Present Illness: Don [name] is a 67 y.o. Caucasian male with history of nonischemic cardiomyopathy with ACE 20% status post insertion of bi-V defibrillator, history of alcohol abuse with cessation in January of 2014, stroke/TIAs, and hypothyroidism who presents with the above complaints.  He reports that his symptoms started about 1 and 1/2 weeks ago when he started having diarrhea.  He has had intermittent episodes of chronic diarrhea, but he reports that this has been the worst.  He has had a history of hemorrhoids with hemorrhoidal bleeding in the past, he has had screening colonoscopy approximately 2 and half years ago which was unremarkable.  He has had intermittent episodes of bleeding with his diarrhea.  With his most recent episode of diarrhea he has had more significant bleeding and is very prominent on tissue paper.  About a week ago he has had a fall and had been to his primary care physician's office where he was diagnosed with right seventh and eighth rib fracture and was started on pain medication.  However he has had continued diarrhea and has been feeling dizzy and lightheaded at home.  Over the last 2 days he reports that he has not made any urine, and he has had decrease in appetite.  He had gone to his primary care physician's office again today but refer to the ER for further evaluation.  In the ER patient was found to be in acute renal failure with a creatinine of 8.48, the hospitalist service was consulted for further care and management.  Patient denies any recent fevers, chills, vomiting, chest pain (other than rib pain), abdominal pain, or vision changes.  He does complain of shortness of breath with the rib pain that he has had.  Complaining of a mild headache.  Patient does report that he took amoxicillin after he had diarrhea in the hopes that it would help his  diarrhea.  Review of Systems: All systems reviewed with the patient and positive as per history of present illness, otherwise all other systems are negative.  Past Medical History  Diagnosis Date  . Nonischemic cardiomyopathy     EF 20% s/p INSERTION OF BI-V DEFIBRILLATOR.  . Chronic systolic heart failure     LEFT  . LBBB (left bundle branch block)   . Alcohol abuse     PREVIOUS H/O DRINKING A FIFTH OF VODKA  A DAY  . History of angina   . Stroke     TIA's  . Thyroid disease     HYPOTHYROIDISM.....TREATED  . ICD (implantable cardiac defibrillator) in place    Past Surgical History  Procedure Laterality Date  . Insert / replace / remove pacemaker      BI-V IMPLANTABLE CARDIOVERTER-DEFIBRILLATOR. ; [employer]; MODEL  # [id number], SERIAL # [id number] H.  DR. Humberto Leep EDMUNDS.  Marland Kitchen Cardiac catheterization     Family History  Problem Relation Age of Onset  . Heart disease Mother   . Heart disease Father   . Cancer    . Hypertension     History   Social History  . Marital Status: Widowed    Spouse Name: N/A    Number of Children: N/A  . Years of Education: N/A   Occupational History  . RETIRED     SOCIAL WORKER   Social History Main Topics  . Smoking status: Former Smoker    Quit date: 02/27/2012  . Smokeless tobacco: Not  on file  . Alcohol Use: No     Comment: H/O ALCOHOLISM PERVIOUSLY A FIFTH OF VODKA A DAY  . Drug Use: No  . Sexually Active: Not on file   Other Topics Concern  . Not on file   Social History Narrative   BI-V IMPLANTABLE CARDIOVERTER-DEFIBRILLATOR; [employer] CONCERTO, MODEL # [id number]; SERIAL # [id number] H;       RETIRED SOCIAL WORKER      MARRIED 4 TIMES      H/O ALCOHOLISM; A FIFTH OF VODKA DAILY IN THE PAST            Allergies: Sulfa antibiotics  Home Meds: Prior to Admission medications   Medication Sig Start Date End Date Taking? Authorizing Ruben Mahler  allopurinol (ZYLOPRIM) 100 MG tablet Take 100 mg by mouth daily.    Yes  Historical Ayssa Bentivegna, MD  aspirin 81 MG tablet Take 81 mg by mouth daily.     Yes Historical Clayborne Divis, MD  atorvastatin (LIPITOR) 40 MG tablet Take 40 mg by mouth daily.  09/03/11  Yes Historical Betheny Suchecki, MD  benazepril (LOTENSIN) 20 MG tablet Take 20 mg by mouth daily.   Yes Historical Beryle Zeitz, MD  bismuth subsalicylate (KAOPECTATE) 262 MG/15ML suspension Take 15 mLs by mouth every 6 (six) hours as needed for indigestion.   Yes Historical Cyprian Gongaware, MD  carvedilol (COREG) 12.5 MG tablet Take 12.5 mg by mouth 2 (two) times daily with a meal.    Yes Historical Gabriele Zwilling, MD  furosemide (LASIX) 40 MG tablet Take 40 mg by mouth daily.     Yes Historical Elizabethann Lackey, MD  HYDROcodone-acetaminophen (NORCO/VICODIN) 5-325 MG per tablet Take 1 tablet by mouth every 6 (six) hours as needed for pain.   Yes Historical Dorthy Magnussen, MD  levothyroxine (SYNTHROID, LEVOTHROID) 112 MCG tablet Take 112 mcg by mouth daily.   Yes Historical Sheppard Luckenbach, MD  meloxicam (MOBIC) 15 MG tablet Take 15 mg by mouth daily.   Yes Historical Josi Roediger, MD  nitroGLYCERIN (NITROSTAT) 0.4 MG SL tablet Place 0.4 mg under the tongue every 5 (five) minutes as needed for chest pain.    Yes Historical Joletta Manner, MD    Physical Exam: Blood pressure 94/43, pulse 28, resp. rate 15, SpO2 98.00%. General: Awake, Oriented x3, No acute distress. HEENT: EOMI, dry mucous membranes Neck: Supple CV: S1 and S2, irregular at times with PVCs. Lungs: Clear to ascultation bilaterally Abdomen: Soft, Nontender, Nondistended, +bowel sounds. Ext: Good pulses. Trace edema. No clubbing or cyanosis noted. Neuro: Cranial Nerves II-XII grossly intact. Has 5/5 motor strength in upper and lower extremities.  Lab results:  Recent Labs  04/26/12 1545  NA 140  K 3.4*  CL 101  CO2 20  GLUCOSE 92  BUN 41*  CREATININE 8.48*  CALCIUM 7.9*    Recent Labs  04/26/12 1545  AST 14  ALT 10  ALKPHOS 62  BILITOT 0.2*  PROT 6.4  ALBUMIN 3.0*   No results found  for this basename: LIPASE, AMYLASE,  in the last 72 hours  Recent Labs  04/26/12 1545  WBC 8.6  NEUTROABS 6.0  HGB 13.3  HCT 38.4*  MCV 87.5  PLT 164   No results found for this basename: CKTOTAL, CKMB, CKMBINDEX, TROPONINI,  in the last 72 hours No components found with this basename: POCBNP,  No results found for this basename: DDIMER,  in the last 72 hours No results found for this basename: HGBA1C,  in the last 72 hours No results found for this basename: CHOL, HDL, LDLCALC, TRIG,  CHOLHDL, LDLDIRECT,  in the last 72 hours No results found for this basename: TSH, T4TOTAL, FREET3, T3FREE, THYROIDAB,  in the last 72 hours No results found for this basename: VITAMINB12, FOLATE, FERRITIN, TIBC, IRON, RETICCTPCT,  in the last 72 hours Imaging results:  Dg Chest Portable 1 View  04/26/2012  *RADIOLOGY REPORT*  Clinical Data: Rectal bleeding, former smoking history  PORTABLE CHEST - 1 VIEW  Comparison: Chest x-ray of 04/19/2012  Findings: No active infiltrate or effusion is seen.  Mild atelectasis or scarring remains at the left lung base. Cardiomegaly is stable and AICD lead remains.  No bony abnormality is noted.  IMPRESSION: Stable cardiomegaly with defibrillator.  No active lung disease. Probable left basilar atelectasis or scarring.   Original Report Authenticated By: Dwyane Dee, M.D.    Other results: EKG: Multiple PVCs, patient appears to be paced with a heart rate of 86.  Assessment & Plan by Problem: Acute renal failure Likely due to dehydration/hypovolemia in the setting of the diarrhea, diuretics, and ACE inhibitor.  Continue aggressive fluid resuscitation.  Will get a renal ultrasound.  Send urine sodium and urine creatinine.  Renal consulted.  Dehydration/hypovolemia shock Continue aggressive fluid resuscitation as indicated above.  Blood pressure responsive to fluids.  Diarrhea Check stool for C. difficile.  Start empiric metronidazole.  Patient reports that he started  amoxicillin after he had diarrhea.  Rectal bleed Likely due to patient's history of hemorrhoids worsen due to diarrhea.  Hemoglobin stable.  Expect hemoglobin to decrease with fluids.  Chronic systolic congestive heart failure status post Bi-V ICD Patient appears to be paced on telemetry.  Last available 2-D echocardiogram on 08/10/2006 showed EF of 10%.  Monitor volume status carefully to avoid putting the patient on heart failure.  Patient appears compensated.  Hypothyroidism Continue Synthroid.  Right rib fracture from fall Continue pain control.  Hypokalemia Replace as needed.  Likely due to diarrhea.  Hypothyroidism Continue Synthroid.  History of TIAs/CVA Hold aspirin due to GI bleed.  History of alcohol use Patient counseled on cessation, last use more than 2 months ago.  Start thiamine and folic acid.  If any signs of withdrawal, start CIWA protocol.  CODE STATUS Full code.  Patient indicated that temporally he wants everything done, however he does not wish to be on life support long-term.  Disposition Given patient's hypotension admit the patient to step down.  Time spent on admission, talking to the patient, and coordinating care was: 60 mins.  REDDY,SRIKAR A, MD 04/26/2012, 6:13 PM

## 2012-04-26 NOTE — ED Notes (Signed)
Pain medication to be given after bp increases.

## 2012-04-26 NOTE — ED Provider Notes (Signed)
History    67 year old male who chief complaint rectal bleeding. Patient has noticed intermittently for the past year, but it has been significantly worse for the past week and a half. Diarrhea with bright red blood. No abdominal, back or rectal pain. Feels dizzy. Patient describes a sensation that he may pass out. Generally fatigued and no energy. No fever or chills. No n/v. No urinary complaints aside from decreased output. Feels like he hasn't urinated in 2 days.  Also complaining of right-sided chest pain which he attributes to rib fractures from fall about 1.5w ago. Also achy back and neck pain which started just after this which pt thinks is from sleeping akwardly because of his rib pain. No blood thinners aside from 81mg  ASA. Patient reports screening colonoscopy approximately 2 and half years ago which he thinks was unremarkable.  CSN: [id number]  Arrival date & time 04/26/12  1408   First MD Initiated Contact with Patient 04/26/12 1501      Chief Complaint  Patient presents with  . Rectal Bleeding    (Consider location/radiation/quality/duration/timing/severity/associated sxs/prior treatment) HPI  Past Medical History  Diagnosis Date  . Nonischemic cardiomyopathy     EF 20% s/p INSERTION OF BI-V DEFIBRILLATOR.  . Chronic systolic heart failure     LEFT  . LBBB (left bundle branch block)   . Alcohol abuse     PREVIOUS H/O DRINKING A FIFTH OF VODKA  A DAY  . History of angina   . Stroke     TIA's  . Thyroid disease     HYPOTHYROIDISM.....TREATED  . ICD (implantable cardiac defibrillator) in place     Past Surgical History  Procedure Laterality Date  . Insert / replace / remove pacemaker      BI-V IMPLANTABLE CARDIOVERTER-DEFIBRILLATOR. ; [employer]; MODEL  # [id number], SERIAL # [id number] H.  DR. Humberto Leep EDMUNDS.  Marland Kitchen Cardiac catheterization      Family History  Problem Relation Age of Onset  . Heart disease Mother   . Heart disease Father   . Cancer    .  Hypertension      History  Substance Use Topics  . Smoking status: Former Smoker    Quit date: 02/27/2012  . Smokeless tobacco: Not on file  . Alcohol Use: No     Comment: H/O ALCOHOLISM PERVIOUSLY A FIFTH OF VODKA A DAY      Review of Systems  All systems reviewed and negative, other than as noted in HPI.   Allergies  Sulfa antibiotics  Home Medications   Current Outpatient Rx  Name  Route  Sig  Dispense  Refill  . allopurinol (ZYLOPRIM) 100 MG tablet   Oral   Take 100 mg by mouth 2 (two) times daily.          Marland Kitchen aspirin 81 MG tablet   Oral   Take 81 mg by mouth daily.           Marland Kitchen atorvastatin (LIPITOR) 40 MG tablet      40 mg daily.          . benazepril (LOTENSIN) 20 MG tablet   Oral   Take 20 mg by mouth daily.         . carvedilol (COREG) 12.5 MG tablet   Oral   Take 12.5 mg by mouth 2 (two) times daily with a meal.          . furosemide (LASIX) 40 MG tablet   Oral   Take 40 mg by  mouth daily.           Marland Kitchen ibuprofen (ADVIL,MOTRIN) 200 MG tablet   Oral   Take 200 mg by mouth every 6 (six) hours as needed. For pain         . levothyroxine (SYNTHROID, LEVOTHROID) 150 MCG tablet   Oral   Take 137 mcg by mouth daily.          . nitroGLYCERIN (NITROSTAT) 0.4 MG SL tablet   Sublingual   Place 0.4 mg under the tongue every 5 (five) minutes as needed.         Marland Kitchen OVER THE COUNTER MEDICATION   Oral   Take 1 tablet by mouth daily as needed. For allergies. Takes [employer] PE sinus and allergy           There were no vitals taken for this visit.  Physical Exam  Nursing note and vitals reviewed. Constitutional: He appears well-developed and well-nourished. No distress.  HENT:  Head: Normocephalic and atraumatic.  Eyes: Conjunctivae are normal. Right eye exhibits no discharge. Left eye exhibits no discharge.  Neck: Neck supple.  Cardiovascular: Normal rate, regular rhythm and normal heart sounds.  Exam reveals no gallop and no friction  rub.   No murmur heard. Pulmonary/Chest: Effort normal and breath sounds normal. No respiratory distress.  Abdominal: Soft. He exhibits no distension. There is no tenderness.  Genitourinary:  Bright red  blood noted on rectal exam. No obvious source. Patient has a small perianal skin tag.  Musculoskeletal: He exhibits no edema and no tenderness.  Neurological: He is alert.  Skin: Skin is warm and dry.  Psychiatric: He has a normal mood and affect. His behavior is normal. Thought content normal.    ED Course  Procedures (including critical care time)  CRITICAL CARE Performed by: Raeford Razor   Total critical care time: 35 minutes  Critical care time was exclusive of separately billable procedures and treating other patients.  Critical care was necessary to treat or prevent imminent or life-threatening deterioration.  Critical care was time spent personally by me on the following activities: development of treatment plan with patient and/or surrogate as well as nursing, discussions with consultants, evaluation of patient's response to treatment, examination of patient, obtaining history from patient or surrogate, ordering and performing treatments and interventions, ordering and review of laboratory studies, ordering and review of radiographic studies, pulse oximetry and re-evaluation of patient's condition.   Labs Reviewed  CBC WITH DIFFERENTIAL - Abnormal; Notable for the following:    HCT 38.4 (*)    All other components within normal limits  COMPREHENSIVE METABOLIC PANEL - Abnormal; Notable for the following:    Potassium 3.4 (*)    BUN 41 (*)    Creatinine, Ser 8.48 (*)    Calcium 7.9 (*)    Albumin 3.0 (*)    Total Bilirubin 0.2 (*)    GFR calc non Af Amer 6 (*)    GFR calc Af Amer 7 (*)    All other components within normal limits  APTT  PROTIME-INR  TYPE AND SCREEN  ABO/RH   Dg Chest Portable 1 View  04/26/2012  *RADIOLOGY REPORT*  Clinical Data: Rectal  bleeding, former smoking history  PORTABLE CHEST - 1 VIEW  Comparison: Chest x-ray of 04/19/2012  Findings: No active infiltrate or effusion is seen.  Mild atelectasis or scarring remains at the left lung base. Cardiomegaly is stable and AICD lead remains.  No bony abnormality is noted.  IMPRESSION: Stable cardiomegaly  with defibrillator.  No active lung disease. Probable left basilar atelectasis or scarring.   Original Report Authenticated By: Dwyane Dee, M.D.     EKG:  Rhythm: normal sinus with PVCs Rate: 86 Intervals: 1st degree av block. NS intraventricular delay Axis: left ST segments: NS ST changes    1. ARF (acute renal failure)   2. Dehydration   3. Hypotension   4. Rectal bleeding   5. Diarrhea    MDM  66yM with generalized weakness and rectal bleeding. W/u significant for ARF. Pt with diarrhea recently. Also on loop diuretic and ACEI.  IVF. Needs admit.         Raeford Razor, MD 04/26/12 [phone]

## 2012-04-27 LAB — MRSA PCR SCREENING: MRSA by PCR: NEGATIVE

## 2012-04-27 LAB — CBC
HCT: 34.7 % — ABNORMAL LOW (ref 39.0–52.0)
Hemoglobin: 13.3 g/dL (ref 13.0–17.0)
MCH: 30.1 pg (ref 26.0–34.0)
MCHC: 34.9 g/dL (ref 30.0–36.0)
MCHC: 34.8 g/dL (ref 30.0–36.0)
MCV: 86.6 fL (ref 78.0–100.0)
Platelets: 149 10*3/uL — ABNORMAL LOW (ref 150–400)
Platelets: 175 10*3/uL (ref 150–400)
RBC: 4.25 MIL/uL (ref 4.22–5.81)
RBC: 4.38 MIL/uL (ref 4.22–5.81)
RDW: 13.8 % (ref 11.5–15.5)
RDW: 13.7 % (ref 11.5–15.5)
WBC: 6.3 10*3/uL (ref 4.0–10.5)
WBC: 6.9 10*3/uL (ref 4.0–10.5)

## 2012-04-27 LAB — BASIC METABOLIC PANEL
CO2: 18 mEq/L — ABNORMAL LOW (ref 19–32)
Chloride: 104 mEq/L (ref 96–112)
Sodium: 138 mEq/L (ref 135–145)

## 2012-04-27 LAB — APTT: aPTT: 35 seconds (ref 24–37)

## 2012-04-27 LAB — COMPREHENSIVE METABOLIC PANEL
AST: 13 U/L (ref 0–37)
BUN: 39 mg/dL — ABNORMAL HIGH (ref 6–23)
CO2: 18 mEq/L — ABNORMAL LOW (ref 19–32)
Calcium: 7.3 mg/dL — ABNORMAL LOW (ref 8.4–10.5)
Chloride: 103 mEq/L (ref 96–112)
Creatinine, Ser: 7.56 mg/dL — ABNORMAL HIGH (ref 0.50–1.35)
GFR calc Af Amer: 8 mL/min — ABNORMAL LOW (ref >90)
GFR calc non Af Amer: 7 mL/min — ABNORMAL LOW (ref >90)
Glucose, Bld: 88 mg/dL (ref 70–99)
Total Bilirubin: 0.2 mg/dL — ABNORMAL LOW (ref 0.3–1.2)

## 2012-04-27 LAB — CLOSTRIDIUM DIFFICILE BY PCR: Toxigenic C. Difficile by PCR: NEGATIVE

## 2012-04-27 LAB — PHOSPHORUS: Phosphorus: 6.9 mg/dL — ABNORMAL HIGH (ref 2.3–4.6)

## 2012-04-27 LAB — HEPATITIS C ANTIBODY (REFLEX): HCV Ab: NEGATIVE

## 2012-04-27 LAB — PROTIME-INR: Prothrombin Time: 13.8 seconds (ref 11.6–15.2)

## 2012-04-27 LAB — URINE CULTURE: Colony Count: NO GROWTH

## 2012-04-27 MED ORDER — POTASSIUM CHLORIDE 10 MEQ/100ML IV SOLN
10.0000 meq | INTRAVENOUS | Status: AC
Start: 2012-04-27 — End: 2012-04-27
  Administered 2012-04-27 (×3): 10 meq via INTRAVENOUS
  Filled 2012-04-27 (×2): qty 200

## 2012-04-27 MED ORDER — CARVEDILOL 3.125 MG PO TABS
3.1250 mg | ORAL_TABLET | Freq: Two times a day (BID) | ORAL | Status: DC
  Administered 2012-04-27 – 2012-05-01 (×8): 3.125 mg via ORAL
  Filled 2012-04-27 (×11): qty 1

## 2012-04-27 MED ORDER — MAGNESIUM SULFATE 40 MG/ML IJ SOLN
2.0000 g | Freq: Once | INTRAMUSCULAR | Status: AC
  Administered 2012-04-27: 2 g via INTRAVENOUS
  Filled 2012-04-27: qty 50

## 2012-04-27 MED ORDER — POTASSIUM CHLORIDE 10 MEQ/100ML IV SOLN
10.0000 meq | INTRAVENOUS | Status: AC
  Administered 2012-04-27: 10 meq via INTRAVENOUS

## 2012-04-27 MED ORDER — POTASSIUM CHLORIDE CRYS ER 20 MEQ PO TBCR
40.0000 meq | EXTENDED_RELEASE_TABLET | Freq: Once | ORAL | Status: AC
  Administered 2012-04-27: 40 meq via ORAL
  Filled 2012-04-27: qty 2

## 2012-04-27 MED ORDER — ALLOPURINOL 100 MG PO TABS
100.0000 mg | ORAL_TABLET | Freq: Every day | ORAL | Status: DC
Start: 2012-04-27 — End: 2012-05-01
  Administered 2012-04-27 – 2012-05-01 (×5): 100 mg via ORAL
  Filled 2012-04-27 (×5): qty 1

## 2012-04-27 MED ORDER — SODIUM CHLORIDE 0.9 % IV SOLN
1000.0000 mL | INTRAVENOUS | Status: DC
  Administered 2012-04-27: 75 mL/h via INTRAVENOUS
  Administered 2012-04-27: 75 mL via INTRAVENOUS

## 2012-04-27 NOTE — Progress Notes (Addendum)
[facility] - PROGRESS NOTE Resident Note   Please see below for attending addendum to resident note.  Subjective:   No acute events overnight, denies shortness of breath, states dizziness is improving and states no blood on toilet tissue with bowel movements since admission  Objective:    Vital Signs:   Temp:  [97.4 F (36.3 C)-98.7 F (37.1 C)] 97.7 F (36.5 C) (03/11 0726) Pulse Rate:  [28-86] 74 (03/11 0430) Resp:  [12-22] 13 (03/11 0430) BP: (76-123)/(36-76) 87/57 mmHg (03/11 0726) SpO2:  [83 %-99 %] 98 % (03/11 0430) Weight:  [230 lb 2.6 oz (104.4 kg)] 230 lb 2.6 oz (104.4 kg) (03/10 2115) Last BM Date: 04/26/12  24-hour weight change: Weight change:   Weight trends: Filed Weights   04/26/12 2115  Weight: 230 lb 2.6 oz (104.4 kg)    Intake/Output:  03/10 0701 - 03/11 0700 In: 100 [IV Piggyback:100] Out: 200 [Urine:200]  Physical Exam: General: Pleasant, Well-developed, well-nourished, White male, in no acute distress; HEENT: Normocephalic, atraumatic, PERRLA, EOMI, No signs of anemia or jaundice, Moist mucous membranes Neck: supple, no masses, no carotid Bruits, no JVD appreciated. Lungs: Normal respiratory effort. Clear to auscultation bilaterally from apices to bases without crackles or wheezes appreciated. Heart: distant heart sounds, normal rate, regular rhythm, normal S1 and S2, no gallop, murmur, or rubs appreciated. Abdomen: obese, BS normoactive. Soft, non-tender Extremities: No pretibial edema, distal pulses intact Neurologic: grossly non-focal, alert and oriented x3, appropriate and cooperative throughout examination, conversant     Labs: Basic Metabolic Panel:  Recent Labs Lab 04/26/12 1545 04/27/12 0330  NA 140 138  K 3.4* 2.6*  CL 101 103  CO2 20 18*  GLUCOSE 92 88  BUN 41* 39*  CREATININE 8.48* 7.56*  CALCIUM 7.9* 7.3*  MG  --  1.6  PHOS  --  6.9*    Liver Function Tests:  Recent Labs Lab 04/26/12 1545  04/27/12 0330  AST 14 13  ALT 10 9  ALKPHOS 62 57  BILITOT 0.2* 0.2*  PROT 6.4 5.9*  ALBUMIN 3.0* 2.7*     CBC:  Recent Labs Lab 04/26/12 1545 04/26/12 1915 04/26/12 2043 04/27/12 0330  WBC 8.6  --  8.6 6.3  NEUTROABS 6.0 4.6  --   --   HGB 13.3  --  13.7 12.1*  HCT 38.4*  --  38.5* 34.7*  MCV 87.5  --  86.9 84.4  PLT 164  --  219 149*     Microbiology: Results for orders placed during the hospital encounter of 04/26/12  MRSA PCR SCREENING     Status: None   Collection Time    04/27/12 12:41 AM      Result Value Range Status   MRSA by PCR NEGATIVE  NEGATIVE Final   Comment:            The GeneXpert MRSA Assay (FDA     approved for NASAL specimens     only), is one component of a     comprehensive MRSA colonization     surveillance program. It is not     intended to diagnose MRSA     infection nor to guide or     monitor treatment for     MRSA infections.  CLOSTRIDIUM DIFFICILE BY PCR     Status: None   Collection Time    04/27/12  7:43 AM      Result Value Range Status   C difficile by pcr NEGATIVE  NEGATIVE Final    Coagulation Studies:  Recent Labs  04/26/12 1545 04/27/12 0330  LABPROT 13.7 13.8  INR 1.06 1.07    Urinalysis:    Component Value Date/Time   COLORURINE AMBER* 04/26/2012 2027   APPEARANCEUR HAZY* 04/26/2012 2027   LABSPEC 1.028 04/26/2012 2027   PHURINE 5.0 04/26/2012 2027   GLUCOSEU NEGATIVE 04/26/2012 2027   HGBUR NEGATIVE 04/26/2012 2027   BILIRUBINUR LARGE* 04/26/2012 2027   KETONESUR 15* 04/26/2012 2027   PROTEINUR NEGATIVE 04/26/2012 2027   UROBILINOGEN 0.2 04/26/2012 2027   NITRITE NEGATIVE 04/26/2012 2027   LEUKOCYTESUR NEGATIVE 04/26/2012 2027     Imaging: US Renal  04/26/2012  *RADIOLOGY REPORT*  Clinical Data: Acute renal failure  RENAL/URINARY TRACT ULTRASOUND COMPLETE  Technique:  Sonography of the kidneys and urinary bladder performed.  Examination limited by body habitus.  Comparison:  None  Findings:  Right Kidney:   12.1 cm length.  Normal cortical thickness.  Grossly normal cortical echogenicity.  No mass, hydronephrosis shadowing calcification.  No perinephric fluid.  Left Kidney:  11.5 cm length.  Normal cortical thickness echogenicity.  No gross mass, hydronephrosis shadowing calcification.  No perinephric fluid.  Bladder:  Partially distended, unremarkable.  Ureteral jets were not visualized during the period of imaging.  IMPRESSION: No evidence of renal mass or urinary tract obstruction.   Original Report Authenticated By: Ulyses Southward, M.D.    Dg Chest Portable 1 View  04/26/2012  *RADIOLOGY REPORT*  Clinical Data: Rectal bleeding, former smoking history  PORTABLE CHEST - 1 VIEW  Comparison: Chest x-ray of 04/19/2012  Findings: No active infiltrate or effusion is seen.  Mild atelectasis or scarring remains at the left lung base. Cardiomegaly is stable and AICD lead remains.  No bony abnormality is noted.  IMPRESSION: Stable cardiomegaly with defibrillator.  No active lung disease. Probable left basilar atelectasis or scarring.   Original Report Authenticated By: Dwyane Dee, M.D.       Medications:    Infusions: . sodium chloride 1,000 mL (04/27/12 1100)    Scheduled Medications: . folic acid  1 mg Oral Daily  . levothyroxine  112 mcg Oral QAC breakfast  . metronidazole  500 mg Intravenous Q8H  . thiamine  100 mg Oral Daily    PRN Medications: acetaminophen, acetaminophen, HYDROcodone-acetaminophen, ondansetron (ZOFRAN) IV, ondansetron   Assessment/ Plan:    Patient is a 67 y.o. male with a PMHx of acute on chronic diarrhea for past 1-2 weeks, recent rib fractures s/p fall with meloxicam for pain management, nonischemic cardiomyopathy, hypothyroidism, ETOH abuse, and h/o stokes who was admitted to [facility] on 04/26/2012 after evaluation for dizziness and diarrhea whereby he was found to have elevated creatinine level of 8.48.   1. Acute Renal Failure: likely hemodynamically in setting of hypotension  from volume depletion, benazepril (ACEi), meloxicam (NSAID) and concurrent lasix use. Creatinine trending down since holding ACEi, lasix and NSAIDs and received 1.5 L normal saline since admission. Cr 7.56 today from 8.48 on admission.  Mild metabolic acidosis with AG 17, Hypokalemic K 2.6, Phos 6.9, Mg 1.6 likely representing pre-renal losses secondary to diarrhea. FENa <1% but pt had been taking diuretic. Low UOP charted thus far ~200cc since admission currently with ~150cc in foley. Renal US unremarkable -cont hold ACEi, Lasix and NSAIDS -cont trend Creatinine -supplement potassium and magnesium as needed -caution with IVF given severe EF reduction as noted in #2, currently getting NS 25cc/h  Recent Labs Lab 04/26/12 1545 04/27/12 0330  CREATININE 8.48* 7.56*  2. Cardiomyopathy, ischemic: s/p ICD, last ECHO with severely reduced LV fxn demonstrating EF 10% 2008, this will limit amount/aggressiveness of IVF.  Pt hypotensive thus cont told bp meds as noted above including carvedilol,  -2D ECHO pending today  3. Diarrhea: C. Diff negative, on Flagyl, work-up per primary team  4. Rectal Bleeding: Hemoglobin relatively stable, likely secondary to diverticular bleed vs hemorroids -cont to monitor Hgb  Recent Labs Lab 04/26/12 1545 04/26/12 2043 04/27/12 0330  HGB 13.3 13.7 12.1*   5. Hx ETOH abuse: consider CIWA, cont thiamine and folate  6. Recent rib fx: cont Vicodin prn  7.  Hypothyroidism: cont levothyroxine     Length of Stay: 1 days  Patient history and plan of care reviewed with attending, Dr. Allena Katz.   Manuela Schwartz, MD  PGYII, Internal Medicine Resident 04/27/2012, 11:38 AM  I have personally seen and examined this patient and agree with the assessment/plan as outlined above by Bosie Clos MD (PGY2). ARF secondary to volume depletion with ongoing ACE-I and NSAID use (probably with evolution into ATN)-- low FeNa inspite of diuretic. Today, Renal function  somewhat better overnight with IVFs-- will decrease rate cautiously for fear of tipping into CHF with his (previously noted) poor EF. No acute HD needs at this time and we'll attempt medical management for now. Continue to monitor I &Os- ?accuracy. Avoid nephrotoxins/ IV contrast. PATEL,JAY K.,MD 04/27/2012 12:39 PM

## 2012-04-27 NOTE — Progress Notes (Signed)
  Echocardiogram 2D Echocardiogram has been performed.  Don [name] A 04/27/2012, 12:06 PM
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
## 2012-04-27 NOTE — Progress Notes (Signed)
CRITICAL VALUE ALERT  Critical value received:  Potassium 2.6  Date of notification:  04/27/2012  Time of notification:  0448  Critical value read back:yes  Nurse who received alert:  Isidor Holts  MD notified (1st page):  Donnamarie Poag NP  Time of first page:  0451  MD notified (2nd page):  Time of second page:  Responding MD:  Donnamarie Poag  Time MD responded:  [phone]

## 2012-04-27 NOTE — Progress Notes (Signed)
Maren Reamer NP in to see Pt.

## 2012-04-27 NOTE — Progress Notes (Signed)
Utilization review completed.  

## 2012-04-27 NOTE — Progress Notes (Signed)
Pt BP 84/49 MD paged awaiting response.

## 2012-04-27 NOTE — Progress Notes (Addendum)
[facility] Progress Note [facility] TEAM 1 - Stepdown/ICU TEAM   Don [name]:[id number] DOB: [birth year] DOA: 04/26/2012 PCP: [name], MD  Brief narrative: Don [name] is a 67 y.o. Caucasian male with history of nonischemic cardiomyopathy with ACE 20% status post insertion of bi-V defibrillator, history of alcohol abuse with cessation in January of 2014, stroke/TIAs, and hypothyroidism who presents with the above complaints. He reports that his symptoms started about 1 and 1/2 weeks ago when he started having diarrhea.  He has had intermittent episodes of chronic diarrhea for over 1 y now but he reports that this has been worse this past month.   He has had a history of hemorrhoids with hemorrhoidal bleeding in the past, he has had screening colonoscopy approximately 2 and half years ago which was unremarkable. He has had intermittent episodes of bleeding with his diarrhea. With his most recent episode of diarrhea he has had more significant bleeding and is very prominent on tissue paper.  About a week ago he has had a fall and had been to his primary care physician's office where he was diagnosed with right seventh and eighth rib fracture and was started on pain medication. However he has had continued diarrhea and has been feeling dizzy and lightheaded at home.  Over the last 2 days he reports that he has not made any urine, and he has had decrease in appetite. He had gone to his primary care physician's office again today but refer to the ER for further evaluation. In the ER patient was found to be in acute renal failure with a creatinine of 8.48, the hospitalist service was consulted for further care and management. Patient denies any recent fevers, chills, vomiting, chest pain (other than rib pain), abdominal pain, or vision changes. He does complain of shortness of breath with the rib pain that he has had. Complaining of a mild headache. Patient does  report that he took amoxicillin after he had diarrhea in the hopes that it would help his diarrhea.   Assessment/Plan: Principal Problem:   Acute renal failure - Renal ultrasound unremarkable  -Likely due to diarrhea (dehydration), ACE, Lasix, Meloxicam -Improving -Appreciate renal f/u   Active Problems:   Chronic systolic heart failure- EF 20% -Needing to hold B Blocker due to hypotension and ACE and Lasix due to dehydration - having ventricular ectopy- replacing K and Mg+- will attemt to resume Coreg as soon as BP can tolerate it - 2 D ECHO report pending    Ventricular tachycardia S/p AICD    Rectal bleeding Hemorrhoidal vs diverticulosis    Diarrhea -Check gi pathogen panel- C Diff negative - Flagyl discontinued -Will call GI -D/c all dairy - if no improvement may try to d/c gluten    Hypokalemia Replacing    Right rib fracture -due to recent fall -pain control    Fall -PT eval    Dehydration -IVF per nephrology  Hyperthyroid s/p ablation for Graves Replace- check thyroid studies for over treatment    Code Status: full code  Disposition Plan: cont to follow in SDU  Consultants: Nephrology   Procedures: none  Antibiotics: Flagyl- 3/10-3/11  DVT prophylaxis: scds  HPI/Subjective: Pt continues to have loose BMs- 4 today. Tells me that diarrhea has been severe for 1 month with on and off bleeding. He is less dizzy today. No nausea or abdominal pain.    Objective: Blood pressure 87/57, pulse 74, temperature 97.2 F (36.2 C), temperature source Oral, resp. rate  13, height 6\' 7"  (2.007 m), weight 104.4 kg (230 lb 2.6 oz), SpO2 98.00%.  Intake/Output Summary (Last 24 hours) at 04/27/12 1633 Last data filed at 04/27/12 1619  Gross per 24 hour  Intake    100 ml  Output    625 ml  Net   -525 ml     Exam: General: No acute respiratory distress Lungs: Clear to auscultation bilaterally without wheezes or crackles Cardiovascular: Regular rate and  rhythm without murmur gallop or rub normal S1 and S2 Abdomen: Nontender, nondistended, soft, bowel sounds positive, no rebound, no ascites, no appreciable mass Extremities: No significant cyanosis, clubbing, or edema bilateral lower extremities  Data Reviewed: Basic Metabolic Panel:  Recent Labs Lab 04/26/12 1545 04/27/12 0330 04/27/12 1250  NA 140 138 138  K 3.4* 2.6* 2.8*  CL 101 103 104  CO2 20 18* 18*  GLUCOSE 92 88 98  BUN 41* 39* 38*  CREATININE 8.48* 7.56* 5.98*  CALCIUM 7.9* 7.3* 7.4*  MG  --  1.6  --   PHOS  --  6.9*  --    Liver Function Tests:  Recent Labs Lab 04/26/12 1545 04/27/12 0330  AST 14 13  ALT 10 9  ALKPHOS 62 57  BILITOT 0.2* 0.2*  PROT 6.4 5.9*  ALBUMIN 3.0* 2.7*   No results found for this basename: LIPASE, AMYLASE,  in the last 168 hours No results found for this basename: AMMONIA,  in the last 168 hours CBC:  Recent Labs Lab 04/26/12 1545 04/26/12 1915 04/26/12 2043 04/27/12 0330 04/27/12 1250  WBC 8.6  --  8.6 6.3 7.2  NEUTROABS 6.0 4.6  --   --   --   HGB 13.3  --  13.7 12.1* 12.8*  HCT 38.4*  --  38.5* 34.7* 36.8*  MCV 87.5  --  86.9 84.4 86.6  PLT 164  --  219 149* 175   Cardiac Enzymes: No results found for this basename: CKTOTAL, CKMB, CKMBINDEX, TROPONINI,  in the last 168 hours BNP (last 3 results) No results found for this basename: PROBNP,  in the last 8760 hours CBG: No results found for this basename: GLUCAP,  in the last 168 hours  Recent Results (from the past 240 hour(s))  MRSA PCR SCREENING     Status: None   Collection Time    04/27/12 12:41 AM      Result Value Range Status   MRSA by PCR NEGATIVE  NEGATIVE Final   Comment:            The GeneXpert MRSA Assay (FDA     approved for NASAL specimens     only), is one component of a     comprehensive MRSA colonization     surveillance program. It is not     intended to diagnose MRSA     infection nor to guide or     monitor treatment for     MRSA  infections.  CLOSTRIDIUM DIFFICILE BY PCR     Status: None   Collection Time    04/27/12  7:43 AM      Result Value Range Status   C difficile by pcr NEGATIVE  NEGATIVE Final     Studies:  Recent x-ray studies have been reviewed in detail by the Attending Physician  Scheduled Meds:  Scheduled Meds: . allopurinol  100 mg Oral Daily  . folic acid  1 mg Oral Daily  . levothyroxine  112 mcg Oral QAC breakfast  . magnesium  sulfate 1 - 4 g bolus IVPB  2 g Intravenous Once  . thiamine  100 mg Oral Daily   Continuous Infusions: . sodium chloride 75 mL/hr (04/27/12 1631)    Time spent on care of this patient: 35 min   [facility]  [facility] Office  [phone] Pager - Text Page per Loretha Stapler as per below:  On-Call/Text Page:      [email or web address]      password [email or web address]  If 7PM-7AM, please contact night-coverage [email or web address] Password [facility] 04/27/2012, 4:33 PM   LOS: 1 day

## 2012-04-28 ENCOUNTER — Encounter (HOSPITAL_COMMUNITY): Payer: Self-pay | Admitting: General Practice

## 2012-04-28 LAB — BASIC METABOLIC PANEL
BUN: 25 mg/dL — ABNORMAL HIGH (ref 6–23)
CO2: 21 mEq/L (ref 19–32)
Calcium: 7.8 mg/dL — ABNORMAL LOW (ref 8.4–10.5)
Calcium: 8.1 mg/dL — ABNORMAL LOW (ref 8.4–10.5)
Chloride: 113 mEq/L — ABNORMAL HIGH (ref 96–112)
Chloride: 111 mEq/L (ref 96–112)
Creatinine, Ser: 1.79 mg/dL — ABNORMAL HIGH (ref 0.50–1.35)
GFR calc Af Amer: 44 mL/min — ABNORMAL LOW (ref >90)
GFR calc non Af Amer: 25 mL/min — ABNORMAL LOW (ref >90)
Glucose, Bld: 116 mg/dL — ABNORMAL HIGH (ref 70–99)
Potassium: 3.5 mEq/L (ref 3.5–5.1)
Sodium: 146 mEq/L — ABNORMAL HIGH (ref 135–145)
Sodium: 144 mEq/L (ref 135–145)

## 2012-04-28 LAB — T4, FREE: Free T4: 1.64 ng/dL (ref 0.80–1.80)

## 2012-04-28 LAB — TSH: TSH: 0.035 u[IU]/mL — ABNORMAL LOW (ref 0.350–4.500)

## 2012-04-28 LAB — GI PATHOGEN PANEL BY PCR, STOOL
C difficile toxin A/B: NEGATIVE
Campylobacter by PCR: NEGATIVE
E coli (ETEC) LT/ST: NEGATIVE
E coli (STEC): NEGATIVE
E coli 0157 by PCR: NEGATIVE
Rotavirus A by PCR: NEGATIVE

## 2012-04-28 LAB — MAGNESIUM: Magnesium: 2.3 mg/dL (ref 1.5–2.5)

## 2012-04-28 MED ORDER — POLYETHYLENE GLYCOL 3350 17 G PO PACK
17.0000 g | PACK | Freq: Once | ORAL | Status: DC
  Filled 2012-04-28: qty 1

## 2012-04-28 MED ORDER — DEXTROSE-NACL 5-0.45 % IV SOLN
INTRAVENOUS | Status: DC
  Administered 2012-04-28: 75 mL/h via INTRAVENOUS
  Administered 2012-04-28 – 2012-04-29 (×2): via INTRAVENOUS

## 2012-04-28 MED ORDER — POLYETHYLENE GLYCOL 3350 17 G PO PACK
17.0000 g | PACK | ORAL | Status: AC
  Filled 2012-04-28 (×3): qty 1

## 2012-04-28 MED ORDER — HYDROCORTISONE ACETATE 25 MG RE SUPP
25.0000 mg | Freq: Two times a day (BID) | RECTAL | Status: DC | PRN
  Administered 2012-04-29: 25 mg via RECTAL
  Filled 2012-04-28: qty 1

## 2012-04-28 MED ORDER — POTASSIUM CHLORIDE CRYS ER 20 MEQ PO TBCR
40.0000 meq | EXTENDED_RELEASE_TABLET | ORAL | Status: AC
  Administered 2012-04-28 (×2): 40 meq via ORAL
  Filled 2012-04-28 (×2): qty 2

## 2012-04-28 MED ORDER — OXYCODONE HCL 5 MG PO TABS
5.0000 mg | ORAL_TABLET | ORAL | Status: DC | PRN
  Administered 2012-04-28 – 2012-05-01 (×13): 10 mg via ORAL
  Filled 2012-04-28 (×14): qty 2
  Filled 2012-04-28: qty 1

## 2012-04-28 MED ORDER — SODIUM CHLORIDE 0.9 % IV SOLN
INTRAVENOUS | Status: DC
  Administered 2012-04-29: 500 mL via INTRAVENOUS

## 2012-04-28 MED ORDER — POTASSIUM CHLORIDE CRYS ER 20 MEQ PO TBCR
20.0000 meq | EXTENDED_RELEASE_TABLET | Freq: Every day | ORAL | Status: DC
  Administered 2012-04-29 – 2012-05-01 (×3): 20 meq via ORAL
  Filled 2012-04-28 (×3): qty 1

## 2012-04-28 MED ORDER — PNEUMOCOCCAL VAC POLYVALENT 25 MCG/0.5ML IJ INJ
0.5000 mL | INJECTION | INTRAMUSCULAR | Status: AC
  Administered 2012-04-29: 0.5 mL via INTRAMUSCULAR
  Filled 2012-04-28: qty 0.5

## 2012-04-28 MED ORDER — POLYETHYLENE GLYCOL 3350 17 G PO PACK: 17.0000 g | PACK | ORAL | Status: AC

## 2012-04-28 NOTE — Progress Notes (Addendum)
[facility] - PROGRESS NOTE Resident Note   Please see below for attending addendum to resident note.  Subjective:   No acute events overnight, denies shortness of breath, w/o complaints besides rib discomfort  Objective:    Vital Signs:   Temp:  [97.2 F (36.2 C)-98.5 F (36.9 C)] 97.9 F (36.6 C) (03/12 0733) Pulse Rate:  [75-86] 75 (03/12 0400) Resp:  [16-22] 16 (03/12 0400) BP: (94-111)/(50-65) 94/65 mmHg (03/12 0400) SpO2:  [97 %-99 %] 98 % (03/12 0400) Weight:  [235 lb 3.7 oz (106.7 kg)] 235 lb 3.7 oz (106.7 kg) (03/12 0400) Last BM Date: 04/27/12  24-hour weight change: Weight change: 5 lb 1.1 oz (2.3 kg)  Weight trends: Filed Weights   04/26/12 2115 04/28/12 0400  Weight: 230 lb 2.6 oz (104.4 kg) 235 lb 3.7 oz (106.7 kg)    Intake/Output:  03/11 0701 - 03/12 0700 In: 818.8 [P.O.:360; I.V.:458.8] Out: 1325 [Urine:1325]  Physical Exam: General: Pleasant, Well-developed, well-nourished, White male, in no acute distress; on laptop in bed Lungs: Normal respiratory effort. Clear to auscultation bilaterally from apices to bases without crackles or wheezes appreciated. Heart: distant heart sounds, normal rate, regular rhythm, normal S1 and S2, no gallop, murmur, or rubs appreciated. Abdomen: obese, BS normoactive. Soft, non-tender Extremities: No pretibial edema, distal pulses intact Neurologic: grossly non-focal, alert and oriented x3, appropriate and cooperative throughout examination   Labs: Basic Metabolic Panel:  Recent Labs Lab 04/26/12 1545 04/27/12 0330 04/27/12 1250 04/27/12 1828 04/28/12 0510  NA 140 138 138  --  146*  K 3.4* 2.6* 2.8* 2.8* 2.7*  CL 101 103 104  --  113*  CO2 20 18* 18*  --  21  GLUCOSE 92 88 98  --  104*  BUN 41* 39* 38*  --  33*  CREATININE 8.48* 7.56* 5.98*  --  2.97*  CALCIUM 7.9* 7.3* 7.4*  --  7.8*  MG  --  1.6  --   --  2.3  PHOS  --  6.9*  --   --   --     Liver Function Tests:  Recent Labs Lab  04/26/12 1545 04/27/12 0330  AST 14 13  ALT 10 9  ALKPHOS 62 57  BILITOT 0.2* 0.2*  PROT 6.4 5.9*  ALBUMIN 3.0* 2.7*     CBC:  Recent Labs Lab 04/26/12 1545 04/26/12 1915  04/27/12 0330 04/27/12 1250 04/27/12 1828  WBC 8.6  --   < > 6.3 7.2 6.9  NEUTROABS 6.0 4.6  --   --   --   --   HGB 13.3  --   < > 12.1* 12.8* 13.3  HCT 38.4*  --   < > 34.7* 36.8* 37.7*  MCV 87.5  --   < > 84.4 86.6 86.1  PLT 164  --   < > 149* 175 184  < > = values in this interval not displayed.   Microbiology: Results for orders placed during the hospital encounter of 04/26/12  URINE CULTURE     Status: None   Collection Time    04/26/12  8:27 PM      Result Value Range Status   Specimen Description URINE, RANDOM   Final   Special Requests NONE   Final   Culture  Setup Time 04/27/2012 03:07   Final   Colony Count NO GROWTH   Final   Culture NO GROWTH   Final   Report Status 04/27/2012 FINAL   Final  MRSA PCR SCREENING     Status: None   Collection Time    04/27/12 12:41 AM      Result Value Range Status   MRSA by PCR NEGATIVE  NEGATIVE Final   Comment:            The GeneXpert MRSA Assay (FDA     approved for NASAL specimens     only), is one component of a     comprehensive MRSA colonization     surveillance program. It is not     intended to diagnose MRSA     infection nor to guide or     monitor treatment for     MRSA infections.  CLOSTRIDIUM DIFFICILE BY PCR     Status: None   Collection Time    04/27/12  7:43 AM      Result Value Range Status   C difficile by pcr NEGATIVE  NEGATIVE Final    Coagulation Studies:  Recent Labs  04/26/12 1545 04/27/12 0330  LABPROT 13.7 13.8  INR 1.06 1.07    Urinalysis:    Component Value Date/Time   COLORURINE AMBER* 04/26/2012 2027   APPEARANCEUR HAZY* 04/26/2012 2027   LABSPEC 1.028 04/26/2012 2027   PHURINE 5.0 04/26/2012 2027   GLUCOSEU NEGATIVE 04/26/2012 2027   HGBUR NEGATIVE 04/26/2012 2027   BILIRUBINUR LARGE* 04/26/2012  2027   KETONESUR 15* 04/26/2012 2027   PROTEINUR NEGATIVE 04/26/2012 2027   UROBILINOGEN 0.2 04/26/2012 2027   NITRITE NEGATIVE 04/26/2012 2027   LEUKOCYTESUR NEGATIVE 04/26/2012 2027     Imaging: US Renal  04/26/2012  *RADIOLOGY REPORT*  Clinical Data: Acute renal failure  RENAL/URINARY TRACT ULTRASOUND COMPLETE  Technique:  Sonography of the kidneys and urinary bladder performed.  Examination limited by body habitus.  Comparison:  None  Findings:  Right Kidney:  12.1 cm length.  Normal cortical thickness.  Grossly normal cortical echogenicity.  No mass, hydronephrosis shadowing calcification.  No perinephric fluid.  Left Kidney:  11.5 cm length.  Normal cortical thickness echogenicity.  No gross mass, hydronephrosis shadowing calcification.  No perinephric fluid.  Bladder:  Partially distended, unremarkable.  Ureteral jets were not visualized during the period of imaging.  IMPRESSION: No evidence of renal mass or urinary tract obstruction.   Original Report Authenticated By: Ulyses Southward, M.D.    Dg Chest Portable 1 View  04/26/2012  *RADIOLOGY REPORT*  Clinical Data: Rectal bleeding, former smoking history  PORTABLE CHEST - 1 VIEW  Comparison: Chest x-ray of 04/19/2012  Findings: No active infiltrate or effusion is seen.  Mild atelectasis or scarring remains at the left lung base. Cardiomegaly is stable and AICD lead remains.  No bony abnormality is noted.  IMPRESSION: Stable cardiomegaly with defibrillator.  No active lung disease. Probable left basilar atelectasis or scarring.   Original Report Authenticated By: Dwyane Dee, M.D.       Medications:    Infusions: . dextrose 5 % and 0.45% NaCl      Scheduled Medications: . allopurinol  100 mg Oral Daily  . carvedilol  3.125 mg Oral BID WC  . folic acid  1 mg Oral Daily  . levothyroxine  112 mcg Oral QAC breakfast  . [START ON 04/29/2012] potassium chloride  20 mEq Oral Daily  . potassium chloride  40 mEq Oral Q4H  . thiamine  100 mg Oral  Daily    PRN Medications: acetaminophen, acetaminophen, HYDROcodone-acetaminophen, ondansetron (ZOFRAN) IV, ondansetron   Assessment/ Plan:    Patient is a  67 y.o. male with a PMHx of acute on chronic diarrhea for past 1-2 weeks, recent rib fractures s/p fall with meloxicam for pain management, nonischemic cardiomyopathy, hypothyroidism, ETOH abuse, and h/o stokes who was admitted to [facility] on 04/26/2012 after evaluation for dizziness and diarrhea whereby he was found to have elevated creatinine level of 8.48.   1. Acute Renal Failure: likely hemodynamically in setting of hypotension from volume depletion, benazepril (ACEi), meloxicam (NSAID) and concurrent lasix use. Creatinine trending down since admission 8.48 -->2.97, tolerated IVF fluids which are now discontinued, UOP 1.3 L -cont hold ACEi, Lasix and NSAIDS -cont trend Creatinine -supplement potassium and magnesium as needed  Recent Labs Lab 04/26/12 1545 04/27/12 0330 04/27/12 1250 04/28/12 0510  CREATININE 8.48* 7.56* 5.98* 2.97*   2. Cardiomyopathy, ischemic: s/p ICD, ECHO yesterday with EF 40% which is improved from 10% in 2008  -management per primary  3. Diarrhea: C. Diff negative, on Flagyl, work-up per primary team  4. Rectal Bleeding: Hemoglobin relatively stable, likely secondary to diverticular bleed vs hemorroids -cont to monitor Hgb -consider colonoscopy  Recent Labs Lab 04/26/12 1545 04/26/12 2043 04/27/12 0330 04/27/12 1250 04/27/12 1828  HGB 13.3 13.7 12.1* 12.8* 13.3   5. Hx ETOH abuse: consider CIWA, cont thiamine and folate  6. Recent rib fx: cont Vicodin prn  7.  Hypothyroidism: cont levothyroxine     Length of Stay: 2 days  Patient history and plan of care reviewed with attending, Dr. Allena Katz.   Manuela Schwartz, MD  PGYII, Internal Medicine Resident 04/28/2012, 8:11 AM  I have personally seen and examined this patient and agree with the assessment/plan as outlined above by  Bosie Clos MD (PGY2). Plan to recheck lab this AM given discrepant values regarding hypernatremia. Remains hypokalemic from diarrhea (and likely total body deficits from previous ETOHism)-- replace by PO route and recheck this PM PATEL,JAY K.,MD 04/28/2012 10:01 AM  Labs and clinical status reviewed- renal recovery noted-- will sign off. Please call with concerns.  PATEL,JAY K.,MD 04/29/2012 9:54 AM

## 2012-04-28 NOTE — Progress Notes (Signed)
Report called and Pt transferred stable [id number]

## 2012-04-28 NOTE — Progress Notes (Signed)
Physical Therapy Discharge Patient Details Name: MARLO GOODRICH MRN: [id number] DOB: October 29, 1945 Today's Date: 04/28/2012 Time: [id number] PT Time Calculation (min): 13 min  Patient discharged from PT services secondary to goals met and no further PT needs identified.  Please see latest therapy progress note for current level of functioning and progress toward goals.    Progress and discharge plan discussed with patient and/or caregiver: Patient/Caregiver agrees with plan  GP     [facility] 04/28/2012, 10:50 AM

## 2012-04-28 NOTE — Consult Note (Signed)
[facility] CONSULT Reason for consult: chronic diarrhea and dehydration Referring Physician: [facility]. PCP: Dr. Barnes  Don [name] is an 67 y.o. male.  HPI: 67-year-old gentleman who states that he has had loose stools for several years. For about the past month he has had progressive diarrhea often associated with rectal bleeding. This stools are brown and watery and occasionally blood streaked. He states that he has had 6 to 8 loose bowel movements per day with occasional nocturnal stools and has gotten progressively weaker and weaker. He is begun to feel dizzy and lightheaded and his urine output has dropped tremendously. He has a history of smoking and alcohol use in a history of nonischemic cardiomyopathy. He reports that for the past 3 to 4 months he has stopped smoking and drinking and is begun to exercise regularly and has lost approximately 20 pounds. He clearly attributes is weight loss to his exercise in attention to diet rather than diarrhea. He is not have any significant abdominal pain. He does have a biventricular defibrillator does not have any issues with the defibrillator discharge. His only exposure to antibiotics are amoxicillin to he took for several days in the hopes it would help as diarrhea. He has city water does have cats and does handle the cat litter. He denies an increase in flatulence or belching. No one else in his family has had diarrhea. No travel outside of the county.  Past Medical History  Diagnosis Date  . Nonischemic cardiomyopathy     EF 20% s/p INSERTION OF BI-V DEFIBRILLATOR.  . Chronic systolic heart failure     LEFT  . LBBB (left bundle branch block)   . Alcohol abuse     PREVIOUS H/O DRINKING A FIFTH OF VODKA  A DAY  . History of angina   . Stroke     TIA's  . Thyroid disease     HYPOTHYROIDISM.....TREATED  . ICD (implantable cardiac defibrillator) in place     Past Surgical History  Procedure Laterality Date  .  Insert / replace / remove pacemaker      BI-V IMPLANTABLE CARDIOVERTER-DEFIBRILLATOR. ; [employer]; MODEL  # [id number], SERIAL # [id number].  DR. JOHN H. EDMUNDS.  . Cardiac catheterization      Family History  Problem Relation Age of Onset  . Heart disease Mother   . Heart disease Father   . Cancer    . Hypertension      Social History:  reports that he quit smoking about 2 months ago. He does not have any smokeless tobacco history on file. He reports that he does not drink alcohol or use illicit drugs.  Allergies:  Allergies  Allergen Reactions  . Sulfa Antibiotics     Medications; . allopurinol  100 mg Oral Daily  . carvedilol  3.125 mg Oral BID WC  . folic acid  1 mg Oral Daily  . levothyroxine  112 mcg Oral QAC breakfast  . [START ON 04/29/2012] potassium chloride  20 mEq Oral Daily  . potassium chloride  40 mEq Oral Q4H  . thiamine  100 mg Oral Daily   PRN Meds acetaminophen, acetaminophen, HYDROcodone-acetaminophen, ondansetron (ZOFRAN) IV, ondansetron Results for orders placed during the hospital encounter of 04/26/12 (from the past 48 hour(s))  CBC WITH DIFFERENTIAL     Status: Abnormal   Collection Time    04/26/12  3:45 PM      Result Value Range   WBC 8.6  4.0 - 10.5 K/uL   RBC   4.39  4.22 - 5.81 MIL/uL   Hemoglobin 13.3  13.0 - 17.0 g/dL   HCT 38.4 (*) 39.0 - 52.0 %   MCV 87.5  78.0 - 100.0 fL   MCH 30.3  26.0 - 34.0 pg   MCHC 34.6  30.0 - 36.0 g/dL   RDW 13.6  11.5 - 15.5 %   Platelets 164  150 - 400 K/uL   Neutrophils Relative 71  43 - 77 %   Neutro Abs 6.0  1.7 - 7.7 K/uL   Lymphocytes Relative 17  12 - 46 %   Lymphs Abs 1.5  0.7 - 4.0 K/uL   Monocytes Relative 11  3 - 12 %   Monocytes Absolute 0.9  0.1 - 1.0 K/uL   Eosinophils Relative 1  0 - 5 %   Eosinophils Absolute 0.1  0.0 - 0.7 K/uL   Basophils Relative 0  0 - 1 %   Basophils Absolute 0.0  0.0 - 0.1 K/uL  COMPREHENSIVE METABOLIC PANEL     Status: Abnormal   Collection Time     04/26/12  3:45 PM      Result Value Range   Sodium 140  135 - 145 mEq/L   Potassium 3.4 (*) 3.5 - 5.1 mEq/L   Chloride 101  96 - 112 mEq/L   CO2 20  19 - 32 mEq/L   Glucose, Bld 92  70 - 99 mg/dL   BUN 41 (*) 6 - 23 mg/dL   Creatinine, Ser 8.48 (*) 0.50 - 1.35 mg/dL   Calcium 7.9 (*) 8.4 - 10.5 mg/dL   Total Protein 6.4  6.0 - 8.3 g/dL   Albumin 3.0 (*) 3.5 - 5.2 g/dL   AST 14  0 - 37 U/L   ALT 10  0 - 53 U/L   Alkaline Phosphatase 62  39 - 117 U/L   Total Bilirubin 0.2 (*) 0.3 - 1.2 mg/dL   GFR calc non Af Amer 6 (*) >90 mL/min   GFR calc Af Amer 7 (*) >90 mL/min   Comment:            The eGFR has been calculated     using the CKD EPI equation.     This calculation has not been     validated in all clinical     situations.     eGFR's persistently     <90 mL/min signify     possible Chronic Kidney Disease.  APTT     Status: None   Collection Time    04/26/12  3:45 PM      Result Value Range   aPTT 32  24 - 37 seconds  PROTIME-INR     Status: None   Collection Time    04/26/12  3:45 PM      Result Value Range   Prothrombin Time 13.7  11.6 - 15.2 seconds   INR 1.06  0.00 - 1.49  TYPE AND SCREEN     Status: None   Collection Time    04/26/12  3:46 PM      Result Value Range   ABO/RH(D) B POS     Antibody Screen NEG     Sample Expiration 04/29/2012    ABO/RH     Status: None   Collection Time    04/26/12  3:46 PM      Result Value Range   ABO/RH(D) B POS    DIFFERENTIAL     Status: None   Collection Time      04/26/12  7:15 PM      Result Value Range   Neutrophils Relative 65  43 - 77 %   Neutro Abs 4.6  1.7 - 7.7 K/uL   Lymphocytes Relative 22  12 - 46 %   Lymphs Abs 1.6  0.7 - 4.0 K/uL   Monocytes Relative 11  3 - 12 %   Monocytes Absolute 0.8  0.1 - 1.0 K/uL   Eosinophils Relative 2  0 - 5 %   Eosinophils Absolute 0.1  0.0 - 0.7 K/uL   Basophils Relative 0  0 - 1 %   Basophils Absolute 0.0  0.0 - 0.1 K/uL  HEPATITIS B SURFACE ANTIGEN     Status: None    Collection Time    04/26/12  7:15 PM      Result Value Range   Hepatitis B Surface Ag NEGATIVE  NEGATIVE  HEPATITIS C ANTIBODY (REFLEX)     Status: None   Collection Time    04/26/12  7:15 PM      Result Value Range   HCV Ab NEGATIVE  NEGATIVE  URINE CULTURE     Status: None   Collection Time    04/26/12  8:27 PM      Result Value Range   Specimen Description URINE, RANDOM     Special Requests NONE     Culture  Setup Time 04/27/2012 03:07     Colony Count NO GROWTH     Culture NO GROWTH     Report Status 04/27/2012 FINAL    URINALYSIS, ROUTINE W REFLEX MICROSCOPIC     Status: Abnormal   Collection Time    04/26/12  8:27 PM      Result Value Range   Color, Urine AMBER (*) YELLOW   Comment: BIOCHEMICALS MAY BE AFFECTED BY COLOR   APPearance HAZY (*) CLEAR   Specific Gravity, Urine 1.028  1.005 - 1.030   pH 5.0  5.0 - 8.0   Glucose, UA NEGATIVE  NEGATIVE mg/dL   Hgb urine dipstick NEGATIVE  NEGATIVE   Bilirubin Urine LARGE (*) NEGATIVE   Ketones, ur 15 (*) NEGATIVE mg/dL   Protein, ur NEGATIVE  NEGATIVE mg/dL   Urobilinogen, UA 0.2  0.0 - 1.0 mg/dL   Nitrite NEGATIVE  NEGATIVE   Leukocytes, UA NEGATIVE  NEGATIVE   Comment: MICROSCOPIC NOT DONE ON URINES WITH NEGATIVE PROTEIN, BLOOD, LEUKOCYTES, NITRITE, OR GLUCOSE <1000 mg/dL.  SODIUM, URINE, RANDOM     Status: None   Collection Time    04/26/12  8:27 PM      Result Value Range   Sodium, Ur 16    CREATININE, URINE, RANDOM     Status: None   Collection Time    04/26/12  8:27 PM      Result Value Range   Creatinine, Urine 604.11    CBC     Status: Abnormal   Collection Time    04/26/12  8:43 PM      Result Value Range   WBC 8.6  4.0 - 10.5 K/uL   RBC 4.43  4.22 - 5.81 MIL/uL   Hemoglobin 13.7  13.0 - 17.0 g/dL   HCT 38.5 (*) 39.0 - 52.0 %   MCV 86.9  78.0 - 100.0 fL   MCH 30.9  26.0 - 34.0 pg   MCHC 35.6  30.0 - 36.0 g/dL   RDW 13.6  11.5 - 15.5 %   Platelets 219  150 - 400 K/uL  MRSA PCR SCREENING       Status:  None   Collection Time    04/27/12 12:41 AM      Result Value Range   MRSA by PCR NEGATIVE  NEGATIVE   Comment:            The GeneXpert MRSA Assay (FDA     approved for NASAL specimens     only), is one component of a     comprehensive MRSA colonization     surveillance program. It is not     intended to diagnose MRSA     infection nor to guide or     monitor treatment for     MRSA infections.  CBC     Status: Abnormal   Collection Time    04/27/12  3:30 AM      Result Value Range   WBC 6.3  4.0 - 10.5 K/uL   RBC 4.11 (*) 4.22 - 5.81 MIL/uL   Hemoglobin 12.1 (*) 13.0 - 17.0 g/dL   HCT 34.7 (*) 39.0 - 52.0 %   MCV 84.4  78.0 - 100.0 fL   MCH 29.4  26.0 - 34.0 pg   MCHC 34.9  30.0 - 36.0 g/dL   RDW 13.8  11.5 - 15.5 %   Platelets 149 (*) 150 - 400 K/uL   Comment: DELTA CHECK NOTED     REPEATED TO VERIFY  COMPREHENSIVE METABOLIC PANEL     Status: Abnormal   Collection Time    04/27/12  3:30 AM      Result Value Range   Sodium 138  135 - 145 mEq/L   Potassium 2.6 (*) 3.5 - 5.1 mEq/L   Comment: DELTA CHECK NOTED     CRITICAL RESULT CALLED TO, READ BACK BY AND VERIFIED WITH:     WORLEY L,RN 04/27/12 0446 WAYK   Chloride 103  96 - 112 mEq/L   CO2 18 (*) 19 - 32 mEq/L   Glucose, Bld 88  70 - 99 mg/dL   BUN 39 (*) 6 - 23 mg/dL   Creatinine, Ser 7.56 (*) 0.50 - 1.35 mg/dL   Calcium 7.3 (*) 8.4 - 10.5 mg/dL   Total Protein 5.9 (*) 6.0 - 8.3 g/dL   Albumin 2.7 (*) 3.5 - 5.2 g/dL   AST 13  0 - 37 U/L   ALT 9  0 - 53 U/L   Alkaline Phosphatase 57  39 - 117 U/L   Total Bilirubin 0.2 (*) 0.3 - 1.2 mg/dL   GFR calc non Af Amer 7 (*) >90 mL/min   GFR calc Af Amer 8 (*) >90 mL/min   Comment:            The eGFR has been calculated     using the CKD EPI equation.     This calculation has not been     validated in all clinical     situations.     eGFR's persistently     <90 mL/min signify     possible Chronic Kidney Disease.  PHOSPHORUS     Status: Abnormal   Collection  Time    04/27/12  3:30 AM      Result Value Range   Phosphorus 6.9 (*) 2.3 - 4.6 mg/dL  APTT     Status: None   Collection Time    04/27/12  3:30 AM      Result Value Range   aPTT 35  24 - 37 seconds  PROTIME-INR     Status: None   Collection Time      04/27/12  3:30 AM      Result Value Range   Prothrombin Time 13.8  11.6 - 15.2 seconds   INR 1.07  0.00 - 1.49  MAGNESIUM     Status: None   Collection Time    04/27/12  3:30 AM      Result Value Range   Magnesium 1.6  1.5 - 2.5 mg/dL  CLOSTRIDIUM DIFFICILE BY PCR     Status: None   Collection Time    04/27/12  7:43 AM      Result Value Range   C difficile by pcr NEGATIVE  NEGATIVE  CBC     Status: Abnormal   Collection Time    04/27/12 12:50 PM      Result Value Range   WBC 7.2  4.0 - 10.5 K/uL   RBC 4.25  4.22 - 5.81 MIL/uL   Hemoglobin 12.8 (*) 13.0 - 17.0 g/dL   HCT 36.8 (*) 39.0 - 52.0 %   MCV 86.6  78.0 - 100.0 fL   MCH 30.1  26.0 - 34.0 pg   MCHC 34.8  30.0 - 36.0 g/dL   RDW 13.7  11.5 - 15.5 %   Platelets 175  150 - 400 K/uL  BASIC METABOLIC PANEL     Status: Abnormal   Collection Time    04/27/12 12:50 PM      Result Value Range   Sodium 138  135 - 145 mEq/L   Potassium 2.8 (*) 3.5 - 5.1 mEq/L   Chloride 104  96 - 112 mEq/L   CO2 18 (*) 19 - 32 mEq/L   Glucose, Bld 98  70 - 99 mg/dL   BUN 38 (*) 6 - 23 mg/dL   Creatinine, Ser 5.98 (*) 0.50 - 1.35 mg/dL   Calcium 7.4 (*) 8.4 - 10.5 mg/dL   GFR calc non Af Amer 9 (*) >90 mL/min   GFR calc Af Amer 10 (*) >90 mL/min   Comment:            The eGFR has been calculated     using the CKD EPI equation.     This calculation has not been     validated in all clinical     situations.     eGFR's persistently     <90 mL/min signify     possible Chronic Kidney Disease.  POTASSIUM     Status: Abnormal   Collection Time    04/27/12  6:28 PM      Result Value Range   Potassium 2.8 (*) 3.5 - 5.1 mEq/L  TSH     Status: Abnormal   Collection Time    04/27/12  6:28  PM      Result Value Range   TSH 0.035 (*) 0.350 - 4.500 uIU/mL  T4, FREE     Status: None   Collection Time    04/27/12  6:28 PM      Result Value Range   Free T4 1.64  0.80 - 1.80 ng/dL  CBC     Status: Abnormal   Collection Time    04/27/12  6:28 PM      Result Value Range   WBC 6.9  4.0 - 10.5 K/uL   RBC 4.38  4.22 - 5.81 MIL/uL   Hemoglobin 13.3  13.0 - 17.0 g/dL   HCT 37.7 (*) 39.0 - 52.0 %   MCV 86.1  78.0 - 100.0 fL   MCH 30.4  26.0 - 34.0 pg     MCHC 35.3  30.0 - 36.0 g/dL   RDW 13.5  11.5 - 15.5 %   Platelets 184  150 - 400 K/uL  MAGNESIUM     Status: None   Collection Time    04/28/12  5:10 AM      Result Value Range   Magnesium 2.3  1.5 - 2.5 mg/dL  BASIC METABOLIC PANEL     Status: Abnormal   Collection Time    04/28/12  5:10 AM      Result Value Range   Sodium 146 (*) 135 - 145 mEq/L   Potassium 2.7 (*) 3.5 - 5.1 mEq/L   Comment: CRITICAL RESULT CALLED TO, READ BACK BY AND VERIFIED WITH:     WOFFORD, R. RN [id number] EBANKS COLCLOUGH, S   Chloride 113 (*) 96 - 112 mEq/L   CO2 21  19 - 32 mEq/L   Glucose, Bld 104 (*) 70 - 99 mg/dL   BUN 33 (*) 6 - 23 mg/dL   Creatinine, Ser 2.97 (*) 0.50 - 1.35 mg/dL   Comment: DELTA CHECK NOTED   Calcium 7.8 (*) 8.4 - 10.5 mg/dL   GFR calc non Af Amer 20 (*) >90 mL/min   GFR calc Af Amer 24 (*) >90 mL/min   Comment:            The eGFR has been calculated     using the CKD EPI equation.     This calculation has not been     validated in all clinical     situations.     eGFR's persistently     <90 mL/min signify     possible Chronic Kidney Disease.    [facility]  04/26/2012  *RADIOLOGY REPORT*  Clinical Data: Acute renal failure  RENAL/URINARY TRACT ULTRASOUND COMPLETE  Technique:  Sonography of the kidneys and urinary bladder performed.  Examination limited by body habitus.  Comparison:  None  Findings:  Right Kidney:  12.1 cm length.  Normal cortical thickness.  Grossly normal cortical echogenicity.  No mass,  hydronephrosis shadowing calcification.  No perinephric fluid.  Left Kidney:  11.5 cm length.  Normal cortical thickness echogenicity.  No gross mass, hydronephrosis shadowing calcification.  No perinephric fluid.  Bladder:  Partially distended, unremarkable.  Ureteral jets were not visualized during the period of imaging.  IMPRESSION: No evidence of renal mass or urinary tract obstruction.   Original Report Authenticated By: Mark Boles, M.D.    Dg Chest Portable 1 View  04/26/2012  *RADIOLOGY REPORT*  Clinical Data: Rectal bleeding, former smoking history  PORTABLE CHEST - 1 VIEW  Comparison: Chest x-ray of 04/19/2012  Findings: No active infiltrate or effusion is seen.  Mild atelectasis or scarring remains at the left lung base. Cardiomegaly is stable and AICD lead remains.  No bony abnormality is noted.  IMPRESSION: Stable cardiomegaly with defibrillator.  No active lung disease. Probable left basilar atelectasis or scarring.   Original Report Authenticated By: Paul Barry, M.D.               Blood pressure 106/61, pulse 69, temperature 97.9 F (36.6 C), temperature source Oral, resp. rate 16, height 6' 7" (2.007 m), weight 106.7 kg (235 lb 3.7 oz), SpO2 98.00%.  Physical exam:   Gen. -- Pleasant white male who is alert and oriented in no distress CV -- no murmurs are gallops Lungs -- clear Abdomen -- non-distended, soft, nontender with marked increase bowel sounds   Assessment: 1. Diarrhea with rectal bleeding. C.   difficile toxins negative. This could be ischemic or infectious but need to rule out IBD. Screen colonoscopy 1/11 was normal other than diverticulosis and some hemorrhoids. 2. Weight loss. Patient attributes this to direct control and exercise book with progressive diarrhea and loose stools with rectal bleeding I think a valuation of the colon is certainly appropriate 3. Acute renal failure. Has been seen by renal and has felt this is probably secondary to dehydration from  his diarrhea and he is being hydrated. 4. Chronic heart failure. Ejection fraction 20%. Patient has a AICD in place.      Plan: 1. Waits results of the G.I. pathogen panel. We'll tentatively planned for colonoscopy/sigmoidoscopy tomorrow with minimal prep. The purpose of this will be to evaluate for colitis of the left colon. Had discussed this with patient is agreeable.   Braylon Grenda JR,Laray Corbit L 04/28/2012, 8:49 AM      

## 2012-04-28 NOTE — Progress Notes (Signed)
Physical Therapy Treatment Patient Details Name: Don [name] MRN: [id number] DOB: April 02, 1945 Today's Date: 04/28/2012 Time: 4098-1191 PT Time Calculation (min): 13 min  PT Assessment / Plan / Recommendation Comments on Treatment Session  Pt s/p rt rib fx's and with diarrhea.    Follow Up Recommendations  No PT follow up     Does the patient have the potential to tolerate intense rehabilitation     Barriers to Discharge        Equipment Recommendations  None recommended by PT    Recommendations for Other Services    Frequency     Plan All goals met and education completed, patient dischaged from PT services    Precautions / Restrictions Precautions Precautions: None   Pertinent Vitals/Pain Rt rib pain    Mobility  Bed Mobility Bed Mobility: Sit to Sidelying Right Right Sidelying to Sit: 6: Modified independent (Device/Increase time);HOB elevated Sit to Sidelying Right: 6: Modified independent (Device/Increase time);With rail;HOB elevated Transfers Sit to Stand: 7: Independent Stand to Sit: 7: Independent Ambulation/Gait Ambulation/Gait Assistance: 7: Independent Ambulation Distance (Feet): 600 Feet Assistive device: None Gait Pattern: Within Functional Limits Gait velocity: Normal to fast speed    Exercises     PT Diagnosis:    PT Problem List:   PT Treatment Interventions:     PT Goals Acute Rehab PT Goals Pt will Roll Supine to Left Side:  (Pt can perform with effort but using HOB elevated if avail) PT Goal: Rolling Supine to Left Side - Progress: Partly met PT Goal: Supine/Side to Sit - Progress: Partly met (Pt can perform with effort but using HOB elevated if avail) PT Goal: Ambulate - Progress: Met PT Goal: Up/Down Stairs - Progress: Discontinued (comment) (Pt moving very well and doesn't need to practice steps.) Additional Goals PT Goal: Additional Goal #1 - Progress: Discontinued (comment) (Amb with high speed with turns/obstacles without  difficulty)  Visit Information  Last PT Received On: 04/28/12 Assistance Needed: +1    Subjective Data  Subjective: Pt reports feels good walking   Cognition  Cognition Overall Cognitive Status: Appears within functional limits for tasks assessed/performed Arousal/Alertness: Awake/alert Orientation Level: Appears intact for tasks assessed Behavior During Session: [facility] for tasks performed    Balance  Balance Balance Assessed: Yes Static Standing Balance Static Standing - Balance Support: No upper extremity supported Static Standing - Level of Assistance: 7: Independent Dynamic Standing Balance Dynamic Standing - Balance Support: No upper extremity supported Dynamic Standing - Level of Assistance: 7: Independent  End of Session PT - End of Session Activity Tolerance: Patient tolerated treatment well Patient left: in bed;with call bell/phone within reach Nurse Communication: Mobility status   GP     [facility] 04/28/2012, 10:49 AM  Skip Mayer PT [phone]

## 2012-04-28 NOTE — Progress Notes (Signed)
[facility] Progress Note  TEAM 1 - Stepdown/ICU TEAM   Don [name] ZOX:[id number] DOB: 1946-01-29 DOA: 04/26/2012 PCP: [name], MD  Brief narrative: 68 y.o. Caucasian male with history of nonischemic cardiomyopathy with ACE 20% status post insertion of bi-V defibrillator, history of alcohol abuse with cessation in January of 2014, stroke/TIAs, and hypothyroidism who presents with the above complaints. He reports that his symptoms started about 1 and 1/2 weeks ago when he started having diarrhea.  He has had intermittent episodes of chronic diarrhea for over 1 y now but he reports that this has been worse this past month.   He has had a history of hemorrhoids with hemorrhoidal bleeding in the past, he has had screening colonoscopy approximately 2 and half years ago which was unremarkable. He has had intermittent episodes of bleeding with his diarrhea. With his most recent episode of diarrhea he has had more significant bleeding and is very prominent on tissue paper.  About a week ago he has had a fall and had been to his primary care physician's office where he was diagnosed with right seventh and eighth rib fracture and was started on pain medication. However he has had continued diarrhea and has been feeling dizzy and lightheaded at home.  Over the last 2 days he reports that he has not made any urine, and he has had decrease in appetite. He had gone to his primary care physician's office again today but refer to the ER for further evaluation. In the ER patient was found to be in acute renal failure with a creatinine of 8.48, the hospitalist service was consulted for further care and management. Patient denies any recent fevers, chills, vomiting, chest pain (other than rib pain), abdominal pain, or vision changes. He does complain of shortness of breath with the rib pain that he has had. Complaining of a mild headache. Patient does report that he took  amoxicillin after he had diarrhea in the hopes that it would help his diarrhea.   Assessment/Plan:  Acute renal failure - Renal ultrasound unremarkable  -Likely due to diarrhea (dehydration), ACE, Lasix, Meloxicam -Improving -Appreciate renal f/u   Chronic systolic heart failure- EF 20% -Needing to hold B Blocker due to hypotension and ACE and Lasix due to dehydration - having ventricular ectopy- replacing K and Mg+- will attemt to resume Coreg as soon as BP can tolerate it - 2 D ECHO report pending  Ventricular tachycardia S/p AICD  Rectal bleeding Hemorrhoidal vs diverticulosis  Diarrhea -Check gi pathogen panel- C Diff negative - Flagyl discontinued -Will call GI -D/c all dairy - if no improvement may try to d/c gluten  Hypokalemia Replacing  Right rib fracture -due to recent fall -pain control  Fall -PT eval  Dehydration -IVF per nephrology  Hyperthyroid s/p ablation for Graves Replace - thyroid studies conflicting - check T3  Code Status: full code  Disposition Plan: Telel bed  Consultants: Nephrology GI - [facility]   Procedures: none  Antibiotics: Flagyl 3/10 >> 3/11  DVT prophylaxis: scds  HPI/Subjective: Pt continues to have loose BMs with red blood on the paper when he wipes.  No nausea or abdominal pain. No chest pain or sob.   Objective: Blood pressure 106/61, pulse 69, temperature 97.9 F (36.6 C), temperature source Oral, resp. rate 16, height 6\' 7"  (2.007 m), weight 106.7 kg (235 lb 3.7 oz), SpO2 100.00%.  Intake/Output Summary (Last 24 hours) at 04/28/12 1225 Last data filed at 04/28/12 0734  Gross per  24 hour  Intake 818.75 ml  Output   1325 ml  Net -506.25 ml    Exam: General: No acute respiratory distress Lungs: Clear to auscultation bilaterally without wheezes or crackles Cardiovascular: Regular rate and rhythm without murmur gallop or rub normal S1 and S2 Abdomen: Nontender, nondistended, soft, bowel sounds positive, no  rebound, no ascites, no appreciable mass Extremities: No significant cyanosis, clubbing, or edema bilateral lower extremities  Data Reviewed: Basic Metabolic Panel:  Recent Labs Lab 04/26/12 1545 04/27/12 0330 04/27/12 1250 04/27/12 1828 04/28/12 0510 04/28/12 0854  NA 140 138 138  --  146* 144  K 3.4* 2.6* 2.8* 2.8* 2.7* 3.5  CL 101 103 104  --  113* 113*  CO2 20 18* 18*  --  21 22  GLUCOSE 92 88 98  --  104* 116*  BUN 41* 39* 38*  --  33* 30*  CREATININE 8.48* 7.56* 5.98*  --  2.97* 2.48*  CALCIUM 7.9* 7.3* 7.4*  --  7.8* 7.9*  MG  --  1.6  --   --  2.3  --   PHOS  --  6.9*  --   --   --   --    Liver Function Tests:  Recent Labs Lab 04/26/12 1545 04/27/12 0330  AST 14 13  ALT 10 9  ALKPHOS 62 57  BILITOT 0.2* 0.2*  PROT 6.4 5.9*  ALBUMIN 3.0* 2.7*   CBC:  Recent Labs Lab 04/26/12 1545 04/26/12 1915 04/26/12 2043 04/27/12 0330 04/27/12 1250 04/27/12 1828  WBC 8.6  --  8.6 6.3 7.2 6.9  NEUTROABS 6.0 4.6  --   --   --   --   HGB 13.3  --  13.7 12.1* 12.8* 13.3  HCT 38.4*  --  38.5* 34.7* 36.8* 37.7*  MCV 87.5  --  86.9 84.4 86.6 86.1  PLT 164  --  219 149* 175 184    Recent Results (from the past 240 hour(s))  URINE CULTURE     Status: None   Collection Time    04/26/12  8:27 PM      Result Value Range Status   Specimen Description URINE, RANDOM   Final   Special Requests NONE   Final   Culture  Setup Time 04/27/2012 03:07   Final   Colony Count NO GROWTH   Final   Culture NO GROWTH   Final   Report Status 04/27/2012 FINAL   Final  MRSA PCR SCREENING     Status: None   Collection Time    04/27/12 12:41 AM      Result Value Range Status   MRSA by PCR NEGATIVE  NEGATIVE Final   Comment:            The GeneXpert MRSA Assay (FDA     approved for NASAL specimens     only), is one component of a     comprehensive MRSA colonization     surveillance program. It is not     intended to diagnose MRSA     infection nor to guide or     monitor  treatment for     MRSA infections.  CLOSTRIDIUM DIFFICILE BY PCR     Status: None   Collection Time    04/27/12  7:43 AM      Result Value Range Status   C difficile by pcr NEGATIVE  NEGATIVE Final     Studies:  Recent x-ray studies have been reviewed in detail by  the Attending Physician  Scheduled Meds:  Scheduled Meds: . allopurinol  100 mg Oral Daily  . carvedilol  3.125 mg Oral BID WC  . folic acid  1 mg Oral Daily  . levothyroxine  112 mcg Oral QAC breakfast  . [START ON 04/29/2012] pneumococcal 23 valent vaccine  0.5 mL Intramuscular Tomorrow-1000  . [START ON 04/29/2012] polyethylene glycol  17 g Oral Q1 Hr x 3  . [START ON 04/29/2012] potassium chloride  20 mEq Oral Daily  . potassium chloride  40 mEq Oral Q4H  . thiamine  100 mg Oral Daily   Continuous Infusions: . dextrose 5 % and 0.45% NaCl 75 mL/hr (04/28/12 0820)    Time spent on care of this patient: 35 min   [facility]  [facility] Office  [phone] Pager - Text Page per Loretha Stapler as per below:  On-Call/Text Page:      [email or web address]      password [email or web address]  If 7PM-7AM, please contact night-coverage [email or web address] Password [facility] 04/28/2012, 12:25 PM   LOS: 2 days

## 2012-04-29 ENCOUNTER — Encounter (HOSPITAL_COMMUNITY)
Admission: EM | Disposition: A | Discharge status: Home / Self Care | Payer: Self-pay | Source: Home / Self Care | Attending: Internal Medicine

## 2012-04-29 ENCOUNTER — Encounter (HOSPITAL_COMMUNITY): Payer: Self-pay | Admitting: Gastroenterology

## 2012-04-29 DIAGNOSIS — R197 Diarrhea, unspecified: Secondary | ICD-10-CM

## 2012-04-29 DIAGNOSIS — I5022 Chronic systolic (congestive) heart failure: Secondary | ICD-10-CM

## 2012-04-29 DIAGNOSIS — K649 Unspecified hemorrhoids: Secondary | ICD-10-CM

## 2012-04-29 HISTORY — PX: FLEXIBLE SIGMOIDOSCOPY: SHX5431

## 2012-04-29 LAB — RENAL FUNCTION PANEL
Albumin: 2.7 g/dL — ABNORMAL LOW (ref 3.5–5.2)
BUN: 16 mg/dL (ref 6–23)
Calcium: 8.3 mg/dL — ABNORMAL LOW (ref 8.4–10.5)
Glucose, Bld: 102 mg/dL — ABNORMAL HIGH (ref 70–99)
Phosphorus: 2.1 mg/dL — ABNORMAL LOW (ref 2.3–4.6)
Potassium: 3.5 mEq/L (ref 3.5–5.1)
Sodium: 143 mEq/L (ref 135–145)

## 2012-04-29 LAB — CBC
HCT: 37.3 % — ABNORMAL LOW (ref 39.0–52.0)
Hemoglobin: 12.8 g/dL — ABNORMAL LOW (ref 13.0–17.0)
MCH: 30 pg (ref 26.0–34.0)
MCHC: 34.3 g/dL (ref 30.0–36.0)
RDW: 13.7 % (ref 11.5–15.5)

## 2012-04-29 LAB — MAGNESIUM: Magnesium: 1.8 mg/dL (ref 1.5–2.5)

## 2012-04-29 SURGERY — SIGMOIDOSCOPY, FLEXIBLE
Anesthesia: Moderate Sedation

## 2012-04-29 MED ORDER — HYDROCORTISONE ACETATE 25 MG RE SUPP
25.0000 mg | Freq: Two times a day (BID) | RECTAL | Status: DC
  Filled 2012-04-29 (×6): qty 1

## 2012-04-29 MED ORDER — WITCH HAZEL-GLYCERIN EX PADS
MEDICATED_PAD | CUTANEOUS | Status: DC | PRN
  Administered 2012-04-29: 02:00:00 via TOPICAL
  Filled 2012-04-29: qty 100

## 2012-04-29 MED ORDER — MORPHINE SULFATE 2 MG/ML IJ SOLN
2.0000 mg | INTRAMUSCULAR | Status: DC | PRN
  Administered 2012-04-29 – 2012-05-01 (×15): 2 mg via INTRAVENOUS
  Filled 2012-04-29 (×16): qty 1

## 2012-04-29 MED ORDER — LIDOCAINE VISCOUS 2 % MT SOLN
OROMUCOSAL | Status: AC
  Filled 2012-04-29: qty 15

## 2012-04-29 MED ORDER — FENTANYL CITRATE 0.05 MG/ML IJ SOLN
INTRAMUSCULAR | Status: DC | PRN
  Administered 2012-04-29 (×3): 25 ug via INTRAVENOUS

## 2012-04-29 MED ORDER — PSYLLIUM 95 % PO PACK
1.0000 | PACK | Freq: Every day | ORAL | Status: DC
  Administered 2012-04-29 – 2012-05-01 (×3): 1 via ORAL
  Filled 2012-04-29 (×3): qty 1

## 2012-04-29 MED ORDER — MIDAZOLAM HCL 5 MG/5ML IJ SOLN
INTRAMUSCULAR | Status: DC | PRN
  Administered 2012-04-29 (×3): 2.5 mg via INTRAVENOUS

## 2012-04-29 MED ORDER — FENTANYL CITRATE 0.05 MG/ML IJ SOLN
INTRAMUSCULAR | Status: AC
  Filled 2012-04-29: qty 4

## 2012-04-29 MED ORDER — LIDOCAINE 4 % EX CREA
TOPICAL_CREAM | Freq: Four times a day (QID) | CUTANEOUS | Status: DC | PRN
  Administered 2012-04-29 – 2012-04-30 (×4): 1 via TOPICAL
  Filled 2012-04-29 (×2): qty 5

## 2012-04-29 MED ORDER — MIDAZOLAM HCL 5 MG/ML IJ SOLN
INTRAMUSCULAR | Status: AC
Start: 2012-04-29 — End: 2012-04-29
  Filled 2012-04-29: qty 3

## 2012-04-29 MED ORDER — MORPHINE SULFATE 4 MG/ML IJ SOLN
4.0000 mg | Freq: Once | INTRAMUSCULAR | Status: AC
  Administered 2012-04-29: 4 mg via INTRAVENOUS
  Filled 2012-04-29: qty 1

## 2012-04-29 NOTE — H&P (View-Only) (Signed)
[facility] CON[facility] Reason for consult: chronic diarrhea and dehydration Referring Physician: [facility]. PCP: Dr. Mariana Single is an 67 y.o. male.  HPI: 67 year old gentleman who states that he has had loose stools for several years. For about the past month he has had progressive diarrhea often associated with rectal bleeding. This stools are brown and watery and occasionally blood streaked. He states that he has had 6 to 8 loose bowel movements per day with occasional nocturnal stools and has gotten progressively weaker and weaker. He is begun to feel dizzy and lightheaded and his urine output has dropped tremendously. He has a history of smoking and alcohol use in a history of nonischemic cardiomyopathy. He reports that for the past 3 to 4 months he has stopped smoking and drinking and is begun to exercise regularly and has lost approximately 20 pounds. He clearly attributes is weight loss to his exercise in attention to diet rather than diarrhea. He is not have any significant abdominal pain. He does have a biventricular defibrillator does not have any issues with the defibrillator discharge. His only exposure to antibiotics are amoxicillin to he took for several days in the hopes it would help as diarrhea. He has city water does have cats and does handle the cat litter. He denies an increase in flatulence or belching. No one else in his family has had diarrhea. No travel outside of the county.  Past Medical History  Diagnosis Date  . Nonischemic cardiomyopathy     EF 20% s/p INSERTION OF BI-V DEFIBRILLATOR.  . Chronic systolic heart failure     LEFT  . LBBB (left bundle branch block)   . Alcohol abuse     PREVIOUS H/O DRINKING A FIFTH OF VODKA  A DAY  . History of angina   . Stroke     TIA's  . Thyroid disease     HYPOTHYROIDISM.....TREATED  . ICD (implantable cardiac defibrillator) in place     Past Surgical History  Procedure Laterality Date  .  Insert / replace / remove pacemaker      BI-V IMPLANTABLE CARDIOVERTER-DEFIBRILLATOR. ; [employer]; MODEL  # [id number], SERIAL # [id number] H.  DR. Humberto Leep EDMUNDS.  Marland Kitchen Cardiac catheterization      Family History  Problem Relation Age of Onset  . Heart disease Mother   . Heart disease Father   . Cancer    . Hypertension      Social History:  reports that he quit smoking about 2 months ago. He does not have any smokeless tobacco history on file. He reports that he does not drink alcohol or use illicit drugs.  Allergies:  Allergies  Allergen Reactions  . Sulfa Antibiotics     Medications; . allopurinol  100 mg Oral Daily  . carvedilol  3.125 mg Oral BID WC  . folic acid  1 mg Oral Daily  . levothyroxine  112 mcg Oral QAC breakfast  . [START ON 04/29/2012] potassium chloride  20 mEq Oral Daily  . potassium chloride  40 mEq Oral Q4H  . thiamine  100 mg Oral Daily   PRN Meds acetaminophen, acetaminophen, HYDROcodone-acetaminophen, ondansetron (ZOFRAN) IV, ondansetron Results for orders placed during the hospital encounter of 04/26/12 (from the past 48 hour(s))  CBC WITH DIFFERENTIAL     Status: Abnormal   Collection Time    04/26/12  3:45 PM      Result Value Range   WBC 8.6  4.0 - 10.5 K/uL   RBC  4.39  4.22 - 5.81 MIL/uL   Hemoglobin 13.3  13.0 - 17.0 g/dL   HCT 98.1 (*) 19.1 - 47.8 %   MCV 87.5  78.0 - 100.0 fL   MCH 30.3  26.0 - 34.0 pg   MCHC 34.6  30.0 - 36.0 g/dL   RDW 29.5  62.1 - 30.8 %   Platelets 164  150 - 400 K/uL   Neutrophils Relative 71  43 - 77 %   Neutro Abs 6.0  1.7 - 7.7 K/uL   Lymphocytes Relative 17  12 - 46 %   Lymphs Abs 1.5  0.7 - 4.0 K/uL   Monocytes Relative 11  3 - 12 %   Monocytes Absolute 0.9  0.1 - 1.0 K/uL   Eosinophils Relative 1  0 - 5 %   Eosinophils Absolute 0.1  0.0 - 0.7 K/uL   Basophils Relative 0  0 - 1 %   Basophils Absolute 0.0  0.0 - 0.1 K/uL  COMPREHENSIVE METABOLIC PANEL     Status: Abnormal   Collection Time     04/26/12  3:45 PM      Result Value Range   Sodium 140  135 - 145 mEq/L   Potassium 3.4 (*) 3.5 - 5.1 mEq/L   Chloride 101  96 - 112 mEq/L   CO2 20  19 - 32 mEq/L   Glucose, Bld 92  70 - 99 mg/dL   BUN 41 (*) 6 - 23 mg/dL   Creatinine, Ser 6.57 (*) 0.50 - 1.35 mg/dL   Calcium 7.9 (*) 8.4 - 10.5 mg/dL   Total Protein 6.4  6.0 - 8.3 g/dL   Albumin 3.0 (*) 3.5 - 5.2 g/dL   AST 14  0 - 37 U/L   ALT 10  0 - 53 U/L   Alkaline Phosphatase 62  39 - 117 U/L   Total Bilirubin 0.2 (*) 0.3 - 1.2 mg/dL   GFR calc non Af Amer 6 (*) >90 mL/min   GFR calc Af Amer 7 (*) >90 mL/min   Comment:            The eGFR has been calculated     using the CKD EPI equation.     This calculation has not been     validated in all clinical     situations.     eGFR's persistently     <90 mL/min signify     possible Chronic Kidney Disease.  APTT     Status: None   Collection Time    04/26/12  3:45 PM      Result Value Range   aPTT 32  24 - 37 seconds  PROTIME-INR     Status: None   Collection Time    04/26/12  3:45 PM      Result Value Range   Prothrombin Time 13.7  11.6 - 15.2 seconds   INR 1.06  0.00 - 1.49  TYPE AND SCREEN     Status: None   Collection Time    04/26/12  3:46 PM      Result Value Range   ABO/RH(D) B POS     Antibody Screen NEG     Sample Expiration 04/29/2012    ABO/RH     Status: None   Collection Time    04/26/12  3:46 PM      Result Value Range   ABO/RH(D) B POS    DIFFERENTIAL     Status: None   Collection Time  04/26/12  7:15 PM      Result Value Range   Neutrophils Relative 65  43 - 77 %   Neutro Abs 4.6  1.7 - 7.7 K/uL   Lymphocytes Relative 22  12 - 46 %   Lymphs Abs 1.6  0.7 - 4.0 K/uL   Monocytes Relative 11  3 - 12 %   Monocytes Absolute 0.8  0.1 - 1.0 K/uL   Eosinophils Relative 2  0 - 5 %   Eosinophils Absolute 0.1  0.0 - 0.7 K/uL   Basophils Relative 0  0 - 1 %   Basophils Absolute 0.0  0.0 - 0.1 K/uL  HEPATITIS B SURFACE ANTIGEN     Status: None    Collection Time    04/26/12  7:15 PM      Result Value Range   Hepatitis B Surface Ag NEGATIVE  NEGATIVE  HEPATITIS C ANTIBODY (REFLEX)     Status: None   Collection Time    04/26/12  7:15 PM      Result Value Range   HCV Ab NEGATIVE  NEGATIVE  URINE CULTURE     Status: None   Collection Time    04/26/12  8:27 PM      Result Value Range   Specimen Description URINE, RANDOM     Special Requests NONE     Culture  Setup Time 04/27/2012 03:07     Colony Count NO GROWTH     Culture NO GROWTH     Report Status 04/27/2012 FINAL    URINALYSIS, ROUTINE W REFLEX MICROSCOPIC     Status: Abnormal   Collection Time    04/26/12  8:27 PM      Result Value Range   Color, Urine AMBER (*) YELLOW   Comment: BIOCHEMICALS MAY BE AFFECTED BY COLOR   APPearance HAZY (*) CLEAR   Specific Gravity, Urine 1.028  1.005 - 1.030   pH 5.0  5.0 - 8.0   Glucose, UA NEGATIVE  NEGATIVE mg/dL   Hgb urine dipstick NEGATIVE  NEGATIVE   Bilirubin Urine LARGE (*) NEGATIVE   Ketones, ur 15 (*) NEGATIVE mg/dL   Protein, ur NEGATIVE  NEGATIVE mg/dL   Urobilinogen, UA 0.2  0.0 - 1.0 mg/dL   Nitrite NEGATIVE  NEGATIVE   Leukocytes, UA NEGATIVE  NEGATIVE   Comment: MICROSCOPIC NOT DONE ON URINES WITH NEGATIVE PROTEIN, BLOOD, LEUKOCYTES, NITRITE, OR GLUCOSE <1000 mg/dL.  SODIUM, URINE, RANDOM     Status: None   Collection Time    04/26/12  8:27 PM      Result Value Range   Sodium, Ur 16    CREATININE, URINE, RANDOM     Status: None   Collection Time    04/26/12  8:27 PM      Result Value Range   Creatinine, Urine 604.11    CBC     Status: Abnormal   Collection Time    04/26/12  8:43 PM      Result Value Range   WBC 8.6  4.0 - 10.5 K/uL   RBC 4.43  4.22 - 5.81 MIL/uL   Hemoglobin 13.7  13.0 - 17.0 g/dL   HCT 16.1 (*) 09.6 - 04.5 %   MCV 86.9  78.0 - 100.0 fL   MCH 30.9  26.0 - 34.0 pg   MCHC 35.6  30.0 - 36.0 g/dL   RDW 40.9  81.1 - 91.4 %   Platelets 219  150 - 400 K/uL  MRSA PCR SCREENING  Status:  None   Collection Time    04/27/12 12:41 AM      Result Value Range   MRSA by PCR NEGATIVE  NEGATIVE   Comment:            The GeneXpert MRSA Assay (FDA     approved for NASAL specimens     only), is one component of a     comprehensive MRSA colonization     surveillance program. It is not     intended to diagnose MRSA     infection nor to guide or     monitor treatment for     MRSA infections.  CBC     Status: Abnormal   Collection Time    04/27/12  3:30 AM      Result Value Range   WBC 6.3  4.0 - 10.5 K/uL   RBC 4.11 (*) 4.22 - 5.81 MIL/uL   Hemoglobin 12.1 (*) 13.0 - 17.0 g/dL   HCT 14.7 (*) 82.9 - 56.2 %   MCV 84.4  78.0 - 100.0 fL   MCH 29.4  26.0 - 34.0 pg   MCHC 34.9  30.0 - 36.0 g/dL   RDW 13.0  86.5 - 78.4 %   Platelets 149 (*) 150 - 400 K/uL   Comment: DELTA CHECK NOTED     REPEATED TO VERIFY  COMPREHENSIVE METABOLIC PANEL     Status: Abnormal   Collection Time    04/27/12  3:30 AM      Result Value Range   Sodium 138  135 - 145 mEq/L   Potassium 2.6 (*) 3.5 - 5.1 mEq/L   Comment: DELTA CHECK NOTED     CRITICAL RESULT CALLED TO, READ BACK BY AND VERIFIED WITH:     WORLEY L,RN 04/27/12 0446 WAYK   Chloride 103  96 - 112 mEq/L   CO2 18 (*) 19 - 32 mEq/L   Glucose, Bld 88  70 - 99 mg/dL   BUN 39 (*) 6 - 23 mg/dL   Creatinine, Ser 6.96 (*) 0.50 - 1.35 mg/dL   Calcium 7.3 (*) 8.4 - 10.5 mg/dL   Total Protein 5.9 (*) 6.0 - 8.3 g/dL   Albumin 2.7 (*) 3.5 - 5.2 g/dL   AST 13  0 - 37 U/L   ALT 9  0 - 53 U/L   Alkaline Phosphatase 57  39 - 117 U/L   Total Bilirubin 0.2 (*) 0.3 - 1.2 mg/dL   GFR calc non Af Amer 7 (*) >90 mL/min   GFR calc Af Amer 8 (*) >90 mL/min   Comment:            The eGFR has been calculated     using the CKD EPI equation.     This calculation has not been     validated in all clinical     situations.     eGFR's persistently     <90 mL/min signify     possible Chronic Kidney Disease.  PHOSPHORUS     Status: Abnormal   Collection  Time    04/27/12  3:30 AM      Result Value Range   Phosphorus 6.9 (*) 2.3 - 4.6 mg/dL  APTT     Status: None   Collection Time    04/27/12  3:30 AM      Result Value Range   aPTT 35  24 - 37 seconds  PROTIME-INR     Status: None   Collection Time  04/27/12  3:30 AM      Result Value Range   Prothrombin Time 13.8  11.6 - 15.2 seconds   INR 1.07  0.00 - 1.49  MAGNESIUM     Status: None   Collection Time    04/27/12  3:30 AM      Result Value Range   Magnesium 1.6  1.5 - 2.5 mg/dL  CLOSTRIDIUM DIFFICILE BY PCR     Status: None   Collection Time    04/27/12  7:43 AM      Result Value Range   C difficile by pcr NEGATIVE  NEGATIVE  CBC     Status: Abnormal   Collection Time    04/27/12 12:50 PM      Result Value Range   WBC 7.2  4.0 - 10.5 K/uL   RBC 4.25  4.22 - 5.81 MIL/uL   Hemoglobin 12.8 (*) 13.0 - 17.0 g/dL   HCT 16.1 (*) 09.6 - 04.5 %   MCV 86.6  78.0 - 100.0 fL   MCH 30.1  26.0 - 34.0 pg   MCHC 34.8  30.0 - 36.0 g/dL   RDW 40.9  81.1 - 91.4 %   Platelets 175  150 - 400 K/uL  BASIC METABOLIC PANEL     Status: Abnormal   Collection Time    04/27/12 12:50 PM      Result Value Range   Sodium 138  135 - 145 mEq/L   Potassium 2.8 (*) 3.5 - 5.1 mEq/L   Chloride 104  96 - 112 mEq/L   CO2 18 (*) 19 - 32 mEq/L   Glucose, Bld 98  70 - 99 mg/dL   BUN 38 (*) 6 - 23 mg/dL   Creatinine, Ser 7.82 (*) 0.50 - 1.35 mg/dL   Calcium 7.4 (*) 8.4 - 10.5 mg/dL   GFR calc non Af Amer 9 (*) >90 mL/min   GFR calc Af Amer 10 (*) >90 mL/min   Comment:            The eGFR has been calculated     using the CKD EPI equation.     This calculation has not been     validated in all clinical     situations.     eGFR's persistently     <90 mL/min signify     possible Chronic Kidney Disease.  POTASSIUM     Status: Abnormal   Collection Time    04/27/12  6:28 PM      Result Value Range   Potassium 2.8 (*) 3.5 - 5.1 mEq/L  TSH     Status: Abnormal   Collection Time    04/27/12  6:28  PM      Result Value Range   TSH 0.035 (*) 0.350 - 4.500 uIU/mL  T4, FREE     Status: None   Collection Time    04/27/12  6:28 PM      Result Value Range   Free T4 1.64  0.80 - 1.80 ng/dL  CBC     Status: Abnormal   Collection Time    04/27/12  6:28 PM      Result Value Range   WBC 6.9  4.0 - 10.5 K/uL   RBC 4.38  4.22 - 5.81 MIL/uL   Hemoglobin 13.3  13.0 - 17.0 g/dL   HCT 95.6 (*) 21.3 - 08.6 %   MCV 86.1  78.0 - 100.0 fL   MCH 30.4  26.0 - 34.0 pg  MCHC 35.3  30.0 - 36.0 g/dL   RDW 40.9  81.1 - 91.4 %   Platelets 184  150 - 400 K/uL  MAGNESIUM     Status: None   Collection Time    04/28/12  5:10 AM      Result Value Range   Magnesium 2.3  1.5 - 2.5 mg/dL  BASIC METABOLIC PANEL     Status: Abnormal   Collection Time    04/28/12  5:10 AM      Result Value Range   Sodium 146 (*) 135 - 145 mEq/L   Potassium 2.7 (*) 3.5 - 5.1 mEq/L   Comment: CRITICAL RESULT CALLED TO, READ BACK BY AND VERIFIED WITH:     Carmin Muskrat RN [phone] EBANKS COLCLOUGH, S   Chloride 113 (*) 96 - 112 mEq/L   CO2 21  19 - 32 mEq/L   Glucose, Bld 104 (*) 70 - 99 mg/dL   BUN 33 (*) 6 - 23 mg/dL   Creatinine, Ser 8.65 (*) 0.50 - 1.35 mg/dL   Comment: DELTA CHECK NOTED   Calcium 7.8 (*) 8.4 - 10.5 mg/dL   GFR calc non Af Amer 20 (*) >90 mL/min   GFR calc Af Amer 24 (*) >90 mL/min   Comment:            The eGFR has been calculated     using the CKD EPI equation.     This calculation has not been     validated in all clinical     situations.     eGFR's persistently     <90 mL/min signify     possible Chronic Kidney Disease.    US Renal  04/26/2012  *RADIOLOGY REPORT*  Clinical Data: Acute renal failure  RENAL/URINARY TRACT ULTRASOUND COMPLETE  Technique:  Sonography of the kidneys and urinary bladder performed.  Examination limited by body habitus.  Comparison:  None  Findings:  Right Kidney:  12.1 cm length.  Normal cortical thickness.  Grossly normal cortical echogenicity.  No mass,  hydronephrosis shadowing calcification.  No perinephric fluid.  Left Kidney:  11.5 cm length.  Normal cortical thickness echogenicity.  No gross mass, hydronephrosis shadowing calcification.  No perinephric fluid.  Bladder:  Partially distended, unremarkable.  Ureteral jets were not visualized during the period of imaging.  IMPRESSION: No evidence of renal mass or urinary tract obstruction.   Original Report Authenticated By: Ulyses Southward, M.D.    Dg Chest Portable 1 View  04/26/2012  *RADIOLOGY REPORT*  Clinical Data: Rectal bleeding, former smoking history  PORTABLE CHEST - 1 VIEW  Comparison: Chest x-ray of 04/19/2012  Findings: No active infiltrate or effusion is seen.  Mild atelectasis or scarring remains at the left lung base. Cardiomegaly is stable and AICD lead remains.  No bony abnormality is noted.  IMPRESSION: Stable cardiomegaly with defibrillator.  No active lung disease. Probable left basilar atelectasis or scarring.   Original Report Authenticated By: Dwyane Dee, M.D.               Blood pressure 106/61, pulse 69, temperature 97.9 F (36.6 C), temperature source Oral, resp. rate 16, height 6\' 7"  (2.007 m), weight 106.7 kg (235 lb 3.7 oz), SpO2 98.00%.  Physical exam:   Gen. -- Pleasant white male who is alert and oriented in no distress CV -- no murmurs are gallops Lungs -- clear Abdomen -- non-distended, soft, nontender with marked increase bowel sounds   Assessment: 1. Diarrhea with rectal bleeding. C.  difficile toxins negative. This could be ischemic or infectious but need to rule out IBD. Screen colonoscopy 1/11 was normal other than diverticulosis and some hemorrhoids. 2. Weight loss. Patient attributes this to direct control and exercise book with progressive diarrhea and loose stools with rectal bleeding I think a valuation of the colon is certainly appropriate 3. Acute renal failure. Has been seen by renal and has felt this is probably secondary to dehydration from  his diarrhea and he is being hydrated. 4. Chronic heart failure. Ejection fraction 20%. Patient has a AICD in place.      Plan: 1. Waits results of the G.I. pathogen panel. We'll tentatively planned for colonoscopy/sigmoidoscopy tomorrow with minimal prep. The purpose of this will be to evaluate for colitis of the left colon. Had discussed this with patient is agreeable.   Shelbe Haglund JR,Cornelious Diven L 04/28/2012, 8:49 AM

## 2012-04-29 NOTE — Interval H&P Note (Signed)
History and Physical Interval Note:  04/29/2012 12:52 PM  Don [name]  has presented today for surgery, with the diagnosis of gi bleed  The various methods of treatment have been discussed with the patient and family. After consideration of risks, benefits and other options for treatment, the patient has consented to  Procedure(s): FLEXIBLE SIGMOIDOSCOPY (N/A) as a surgical intervention .  The patient's history has been reviewed, patient examined, no change in status, stable for surgery.  I have reviewed the patient's chart and labs.  Questions were answered to the patient's satisfaction.     EDWARDS JR,JAMES L

## 2012-04-29 NOTE — Progress Notes (Signed)
In to see pt for treatment session, noted pt was I/Mod I with PT yesterday. Spoke to patient about OT treatment and he says that BADLs are not an issue, that he is doing well and breathing is not an issue now, off O2. Acute OT will sign off.   Ignacia Palma, OTR/L [phone] 04/29/2012

## 2012-04-29 NOTE — Progress Notes (Signed)
Pt has large, red, protruding hemorrhoid. Pt in worsening pain. MD notified and new orders written for morphine and to hold miralax in AM. Will continue to monitor. Jobe Igo A RN 04/29/2012

## 2012-04-29 NOTE — Op Note (Signed)
[facility] [address] [place], [address]   COLONOSCOPY PROCEDURE REPORT  PATIENT: Don, [name]  MR#: [id number] BIRTHDATE: 1945/02/20 , 66  yrs. old GENDER: Male ENDOSCOPIST: [name], MD REFERRED BY:   [facility]. PCP: Dr. Zachery Dauer PROCEDURE DATE:  04/29/2012 PROCEDURE:     Flexible Sigmoidoscopy with Biopsy ASA CLASS:    class III INDICATIONS:  severe diarrhea with rectal bleeding. Patient has developed severe anal pain. G.I. pathogen panel is negative. MEDICATIONS: fentanyl 75 mcg, versed 7.5 mg IV  DESCRIPTION OF PROCEDURE: The procedure had been explained to the patient consent is obtained. He has gradually developed severe anal pain over the past several days that became severe last night. We examined  his anal area. He has a huge anal mass that is hard put could be massive thrombosed hemorrhoids versus solid mass. I used lidocaine jelly and applied to the whole area. The anal opening was seen in the scope easily passed. The Pentax pediatric colonoscope was used. We advanced up to approximately 70 cm in the descending colon. There was no active bleeding. No diverticulosis, polyps, or colitis was seen. The mucosa appeared endoscopic with normal in several random biopsies were obtained. The scope was withdrawn back into the rectum which was examined in the retroflex view. There were no significant internal hemorrhoids. The scope was withdrawn in the patient tolerated the procedure well.     COMPLICATIONS: None  ENDOSCOPIC IMPRESSION: 1. Severe diarrhea and bleeding. Mucosa of the: endoscopic was normal biopsies obtained. G.I. pathogen panel is negative. This could be viral gastroenteritis bleeding due to anal mass. 2. Massive anal mass. Could be massive hemorrhoidal thrombosis versus solid mass.  RECOMMENDATIONS: would recommend surgical consultation. We'll keep him on clear liquids for now. We will check the  colonic biopsies.    _______________________________ Rosalie DoctorCarman Ching, MD 04/29/2012 1:31 PM

## 2012-04-29 NOTE — Consult Note (Signed)
Don [name] Apr 04, 1945  [id number].    Requesting MD: Dr. Brien Few Chief Complaint/Reason for Consult: anal mass vs hemorrhoids HPI:  67 year old gentleman presented to [facility] on 04/26/12 who states that he has had loose stools for several years.  For about the past month he has had progressive diarrhea often associated with rectal bleeding. This stools are brown and watery and occasionally blood streaked. He states that he has had 6 to 8 loose bowel movements per day and has gotten progressively weaker and weaker. On the day of admission he began to feel dizzy and lightheaded and his urine output has dropped tremendously.  He has a history of smoking and alcohol use in a history of nonischemic cardiomyopathy.  He reports that for the past 3 to 4 months he has stopped smoking and drinking and is begun to exercise regularly and has lost approximately 20 pounds.  He clearly attributes his weight loss to his exercise in attention to diet rather than diarrhea. He is not have any significant abdominal pain. He does have a biventricular defibrillator does not have any issues with the defibrillator discharge.    He also notes having problems with hemorrhoids on and off for years.  Recently due to his diarrhea he has been using the bathroom a lot and notes some straining.  He states the hemorrhoid he has currently is causing him severe pain and they have never been quite this large.  He normally tries preparation H and warm soaks which usually relieves it.  He states the is almost as severe as his kidney stones in the past.  We were asked to see the patient about possible surgery on the anal mass.    Family History  Problem Relation Age of Onset  . Heart disease Mother   . Heart disease Father   . Cancer    . Hypertension      Past Medical History  Diagnosis Date  . Nonischemic cardiomyopathy     EF 20% s/p INSERTION OF BI-V DEFIBRILLATOR.  . Chronic systolic heart failure     LEFT  . LBBB (left  bundle branch block)   . Alcohol abuse     PREVIOUS H/O DRINKING A FIFTH OF VODKA  A DAY  . History of angina   . Stroke     TIA's  . Thyroid disease     HYPOTHYROIDISM.....TREATED  . ICD (implantable cardiac defibrillator) in place   . CHF (congestive heart failure)   . Anginal pain   . Chronic kidney disease     acute renal  04/2012  . Arthritis     hands & back    Past Surgical History  Procedure Laterality Date  . Insert / replace / remove pacemaker      BI-V IMPLANTABLE CARDIOVERTER-DEFIBRILLATOR. ; [employer] CONCERTO; MODEL  # [id number], SERIAL # [id number] H.  DR. Humberto Leep EDMUNDS.  Marland Kitchen Cardiac catheterization    . Tonsillectomy    . Cystoscopy w/ stone manipulation    . Vasectomy      Social History:  reports that he quit smoking about 2 months ago. He has never used smokeless tobacco. He reports that he does not drink alcohol or use illicit drugs.  Allergies:  Allergies  Allergen Reactions  . Sulfa Antibiotics     Medications Prior to Admission  Medication Sig Dispense Refill  . allopurinol (ZYLOPRIM) 100 MG tablet Take 100 mg by mouth daily.       Marland Kitchen aspirin 81 MG tablet Take 81 mg  by mouth daily.        Marland Kitchen atorvastatin (LIPITOR) 40 MG tablet Take 40 mg by mouth daily.       . benazepril (LOTENSIN) 20 MG tablet Take 20 mg by mouth daily.      Marland Kitchen bismuth subsalicylate (KAOPECTATE) 262 MG/15ML suspension Take 15 mLs by mouth every 6 (six) hours as needed for indigestion.      . carvedilol (COREG) 12.5 MG tablet Take 12.5 mg by mouth 2 (two) times daily with a meal.       . furosemide (LASIX) 40 MG tablet Take 40 mg by mouth daily.        Marland Kitchen HYDROcodone-acetaminophen (NORCO/VICODIN) 5-325 MG per tablet Take 1 tablet by mouth every 6 (six) hours as needed for pain.      Marland Kitchen levothyroxine (SYNTHROID, LEVOTHROID) 112 MCG tablet Take 112 mcg by mouth daily.      . meloxicam (MOBIC) 15 MG tablet Take 15 mg by mouth daily.      . nitroGLYCERIN (NITROSTAT) 0.4 MG SL tablet Place 0.4  mg under the tongue every 5 (five) minutes as needed for chest pain.         Blood pressure 143/94, pulse 73, temperature 97.2 F (36.2 C), temperature source Oral, resp. rate 15, height 6\' 1"  (1.854 m), weight 233 lb 11 oz (106 kg), SpO2 100.00%. Physical Exam: General: pleasant, WD/WN white male who is laying in bed in NAD HEENT: head is normocephalic, atraumatic.  Sclera are noninjected.  PERRL.  Ears and nose without any masses or lesions.  Mouth is pink and moist Rectum:  Large anal mass measuring approximately 2.5cm x 3.5cm, pink and smooth, several 3mm round papules, no evidence of thrombosis, exquisite pain at site and +BRBPR noted Skin: warm and dry with no masses, lesions, or rashes Psych: A&Ox3 with an appropriate affect.   No results found.     Assessment/Plan Large anal mass most likely a non-thrombosed hemorrhoid 1.  No immediate surgical interventions 2.  Conservative tx including, sitz baths, hemorrhoid creams, Anusol, tucks pads, and increase fiber 3.  Will arrange for him to f/u with Dr. [name] our colorectal surgeon 4.  Advance diet as tolerated 5.  Surgery will sign off, please call with questions or concerns  Aris Georgia 04/29/2012, 4:19 PM Pager: [phone]

## 2012-04-29 NOTE — Progress Notes (Signed)
Patient is requesting that his diet be advanced; GI physician has been notified and orders given to place patient on heart healthy diet; will continue to monitor patient. Lorretta Harp RN

## 2012-04-29 NOTE — Progress Notes (Signed)
[facility] PROGRESS NOTE  Don [name] ZOX:[id number] DOB: 08-Nov-1945 DOA: 04/26/2012 PCP: [name], MD  Assessment/Plan: Principal Problem:   Acute renal failure Active Problems:   Chronic systolic heart failure   Ventricular tachycardia   Hypovolemic shock   Stroke   Thyroid disease   Rectal bleeding   Diarrhea   Hypokalemia   Right rib fracture   Fall   Dehydration    1. Diarrhea: Patient presented with intermittent episodes of chronic diarrhea for over 1 year, worse this past month., and more recently associated with bloody stools. Initially commenced on Flagyl, but as C. Difficile PCR is negative, this has been discontinued. Managing with supportive care, and diarrhea is now less. Dr [name] provided GI consultation, and colonoscopy is planned, to evaluate for IBD.  2. Dehydration/Acute renal failure: On admission, creatinine was 8.48, BUN 41, consistent with ARF, against a known baseline creatinine of 0.9 in 12/22/11. Renal ultrasound was unremarkable. Etiologies include dehydration from diarrhea, as well as concomitant ACE, Lasix, Meloxicam. Dr Fayrene Fearing Deterding provided nephrology consultation. Managed as recommended, with iv fluids and avoidance of nephrotoxins. Following renal indices, and as of 04/29/12, ARF has resolved. Creatinine is 1.16 today.  3. Chronic systolic heart failure/EF 20%/Ventricular tachycardia : Patient has a known history of nonischemic cardiomyopathy, EF 20%, s/p bi-V defibrillator. No evidence of CHF decompensation at this time. Beta- Blocker on hold due to hypotension and ACE and Lasix due to dehydration/ARF. He did have some ventricular ectopy, likely due to electrolyte abnormalities, but this has resolved. 2 D Echocardiogram showed mildly dilated LV cavity size, EF of 40% to 45%, grade 1 diastolic dysfunction and mildly dilated LA. Have recommenced Coreg today.  4. Rectal bleeding: Patient has rather large and very  impressive hemorrhoids on rectal inspection. Will consult surgery. Meanwhile, Anusol suppositories.   5. Electrolyte abnormalities: These include Hypokalemia and hypomagnesemia. Repleting as indicated.  Replacing  6. Fall/Right rib fracture: Patient is s/p fall about a week ago, complicated by right seventh and eighth rib fracture. Managing with analgesics.   7. Hyperthyroid s/p ablation for Graves disease: On Thyroxine replacement therapy.    Code Status: Full Code.  Family Communication:  Disposition Plan: To be determined.    Brief narrative: 67 y.o. Caucasian male with history of nonischemic cardiomyopathy, EF 20%, status post insertion of bi-V defibrillator, history of alcohol abuse with cessation in January of 2014, stroke/TIAs, and hypothyroidism, presenting with diarrhea, dizziness and rectal bleed. He reports that his symptoms started about 1 and 1/2 weeks ago when he started having diarrhea. He has had intermittent episodes of chronic diarrhea for over 1 year now but he reports that this has been worse this past month. He has had a history of hemorrhoids with hemorrhoidal bleeding in the past, he has had screening colonoscopy approximately 2 and half years ago which was unremarkable. He has had intermittent episodes of bleeding with his diarrhea. About a week ago he has had a fall and had been to his primary care physician's office where he was diagnosed with right seventh and eighth rib fracture and was started on pain medication. However he has had continued diarrhea and has been feeling dizzy and lightheaded at home. Over the last 2 days, he had not made any urine, and he has had decrease in appetite. He had gone to his primary care physician's office again on 04/26/12, but referred to the ED for further evaluation, where he was found to be in acute renal failure with  a creatinine of 8.48, the hospitalist service was consulted for further care and management.   Consultants:  Dr Beryle Lathe, nephrologist.   Dr [name], GI.   Procedures:  CXR.  Renal ultrasound.   Antibiotics:  Flagyl 04/26/12-04/27/12.   HPI/Subjective: Still having significant pain from hemorrhoids. Diarrhea is less.   Objective: Vital signs in last 24 hours: Temp:  [97.3 F (36.3 C)-98 F (36.7 C)] 97.3 F (36.3 C) (03/13 0535) Pulse Rate:  [69-81] 81 (03/13 0131) Resp:  [18-20] 18 (03/13 0535) BP: (106-152)/(61-98) 143/98 mmHg (03/13 0535) SpO2:  [96 %-100 %] 100 % (03/13 0535) Weight:  [105.779 kg (233 lb 3.2 oz)-106 kg (233 lb 11 oz)] 106 kg (233 lb 11 oz) (03/13 0535) Weight change: -0.921 kg (-2 lb 0.5 oz) Last BM Date: 04/28/12  Intake/Output from previous day: 03/12 0701 - 03/13 0700 In: 2194.6 [P.O.:1420; I.V.:774.6] Out: 1025 [Urine:1025]     Physical Exam: General: Comfortable, alert, communicative, fully oriented, not short of breath at rest.  HEENT:  No clinical pallor, no jaundice, no conjunctival injection or discharge. Hydration is fair.  NECK:  Supple, JVP not seen, no carotid bruits, no palpable lymphadenopathy, no palpable goiter. CHEST:  Clinically clear to auscultation, no wheezes, no crackles. HEART:  Sounds 1 and 2 heard, normal, regular, no murmurs. ABDOMEN:  Full, soft, non-tender, no palpable organomegaly, no palpable masses, normal bowel sounds. GENITALIA:  Not examined. RECTAL EXAMINATION: Large prolapsed external hemorrhoid, tender to touch. LOWER EXTREMITIES:  No pitting edema, palpable peripheral pulses. MUSCULOSKELETAL SYSTEM:  Generalized osteoarthritic changes, otherwise, normal. CENTRAL NERVOUS SYSTEM:  No focal neurologic deficit on gross examination.  Lab Results:  Recent Labs  04/27/12 1828 04/29/12 0557  WBC 6.9 7.3  HGB 13.3 12.8*  HCT 37.7* 37.3*  PLT 184 171    Recent Labs  04/28/12 0854 04/28/12 1608  NA 144 142  K 3.5 3.5  CL 113* 111  CO2 22 21  GLUCOSE 116* 116*  BUN 30* 25*  CREATININE 2.48* 1.79*   CALCIUM 7.9* 8.1*   Recent Results (from the past 240 hour(s))  URINE CULTURE     Status: None   Collection Time    04/26/12  8:27 PM      Result Value Range Status   Specimen Description URINE, RANDOM   Final   Special Requests NONE   Final   Culture  Setup Time 04/27/2012 03:07   Final   Colony Count NO GROWTH   Final   Culture NO GROWTH   Final   Report Status 04/27/2012 FINAL   Final  MRSA PCR SCREENING     Status: None   Collection Time    04/27/12 12:41 AM      Result Value Range Status   MRSA by PCR NEGATIVE  NEGATIVE Final   Comment:            The GeneXpert MRSA Assay (FDA     approved for NASAL specimens     only), is one component of a     comprehensive MRSA colonization     surveillance program. It is not     intended to diagnose MRSA     infection nor to guide or     monitor treatment for     MRSA infections.  CLOSTRIDIUM DIFFICILE BY PCR     Status: None   Collection Time    04/27/12  7:43 AM      Result Value Range Status   C  difficile by pcr NEGATIVE  NEGATIVE Final     Studies/Results: No results found.  Medications: Scheduled Meds: . allopurinol  100 mg Oral Daily  . carvedilol  3.125 mg Oral BID WC  . folic acid  1 mg Oral Daily  . hydrocortisone  25 mg Rectal BID  . levothyroxine  112 mcg Oral QAC breakfast  . pneumococcal 23 valent vaccine  0.5 mL Intramuscular Tomorrow-1000  . polyethylene glycol  17 g Oral Q1 Hr x 3  . potassium chloride  20 mEq Oral Daily  . thiamine  100 mg Oral Daily   Continuous Infusions: . sodium chloride    . dextrose 5 % and 0.45% NaCl 50 mL/hr at 04/28/12 2302   PRN Meds:.acetaminophen, acetaminophen, morphine injection, ondansetron (ZOFRAN) IV, ondansetron, oxyCODONE, witch hazel-glycerin    LOS: 3 days   OTI,Jamela Cumbo  [facility] Pager [phone]. If 8PM-8AM, please contact night-coverage at [email or web address], password [facility] 04/29/2012, 7:13 AM  LOS: 3 days

## 2012-04-29 NOTE — Consult Note (Signed)
I have seen and examined the patient and agree with PA-Dort's note Conservative tx, no surgical intervention needed.  F/u with our colorectal surgeon 2 weeks after d/c

## 2012-04-29 NOTE — Progress Notes (Signed)
Pt complain of pain from hemorrhoid. MD notified and new orders given. Will continue to monitor. Jobe Igo A RN 04/29/2012

## 2012-04-29 NOTE — Progress Notes (Signed)
Patient is being transported to Endoscopy. Lorretta Harp RN

## 2012-04-30 ENCOUNTER — Encounter (HOSPITAL_COMMUNITY): Payer: Self-pay | Admitting: Gastroenterology

## 2012-04-30 LAB — CBC
HCT: 36.7 % — ABNORMAL LOW (ref 39.0–52.0)
MCHC: 34.1 g/dL (ref 30.0–36.0)
MCV: 87.8 fL (ref 78.0–100.0)
Platelets: 189 10*3/uL (ref 150–400)
RDW: 13.9 % (ref 11.5–15.5)

## 2012-04-30 LAB — BASIC METABOLIC PANEL
BUN: 9 mg/dL (ref 6–23)
Calcium: 8.3 mg/dL — ABNORMAL LOW (ref 8.4–10.5)
Creatinine, Ser: 1.06 mg/dL (ref 0.50–1.35)
GFR calc Af Amer: 83 mL/min — ABNORMAL LOW (ref >90)
GFR calc non Af Amer: 71 mL/min — ABNORMAL LOW (ref >90)

## 2012-04-30 MED ORDER — FUROSEMIDE 40 MG PO TABS
40.0000 mg | ORAL_TABLET | Freq: Every day | ORAL | Status: DC
  Administered 2012-04-30 – 2012-05-01 (×2): 40 mg via ORAL
  Filled 2012-04-30 (×2): qty 1

## 2012-04-30 MED ORDER — BENAZEPRIL HCL 20 MG PO TABS
20.0000 mg | ORAL_TABLET | Freq: Every day | ORAL | Status: DC
  Administered 2012-04-30 – 2012-05-01 (×2): 20 mg via ORAL
  Filled 2012-04-30 (×2): qty 1

## 2012-04-30 NOTE — Progress Notes (Signed)
[facility] PROGRESS NOTE Subjective Diarrhea resolved. Still with anal pain from massive hemorrhoids. Tolerated diet.  Objective: Vital signs in last 24 hours: Temp:  [97.1 F (36.2 C)-98.3 F (36.8 C)] 97.6 F (36.4 C) (03/14 0924) Pulse Rate:  [63-73] 65 (03/14 0924) Resp:  [9-18] 18 (03/14 0924) BP: (91-152)/(39-101) 113/72 mmHg (03/14 0924) SpO2:  [98 %-100 %] 98 % (03/14 0924) Weight:  [107.9 kg (237 lb 14 oz)] 107.9 kg (237 lb 14 oz) (03/14 0731) Last BM Date: 04/30/12  Intake/Output from previous day: 03/13 0701 - 03/14 0700 In: 2954.2 [P.O.:1200; I.V.:1754.2] Out: 925 [Urine:925] Intake/Output this shift: Total I/O In: 480 [P.O.:480] Out: 550 [Urine:550]   Lab Results:  Recent Labs  04/27/12 1250 04/27/12 1828 04/29/12 0557 04/30/12 0555  WBC 7.2 6.9 7.3 7.0  HGB 12.8* 13.3 12.8* 12.5*  HCT 36.8* 37.7* 37.3* 36.7*  PLT 175 184 171 189   BMET  Recent Labs  04/28/12 0510 04/28/12 0854 04/28/12 1608 04/29/12 0557 04/30/12 0555  NA 146* 144 142 143 142  K 2.7* 3.5 3.5 3.5 3.6  CL 113* 113* 111 114* 111  CO2 21 22 21  18* 23  CREATININE 2.97* 2.48* 1.79* 1.16 1.06   LFT No results found for this basename: PROT, AST, ALT, ALKPHOS, BILITOT, BILIDIR, IBILI,  in the last 72 hours PT/INR No results found for this basename: LABPROT, INR,  in the last 72 hours PANCREAS No results found for this basename: LIPASE,  in the last 72 hours       Studies/Results: No results found.  Medications: I have reviewed the patient's current medications.  Assessment/Plan: 1. Diarrhea. Resolved, will follow up on path but expect to be normal 2. Massive Ext hemorrhoids. To see anorectal surgeon as op  Could be discharged home from our viewpoint on lidocaine cream for hemorrhoids and f/u with the surgeons.   EDWARDS JR,JAMES L 04/30/2012, 12:26 PM

## 2012-04-30 NOTE — Progress Notes (Signed)
[facility] PROGRESS NOTE  ESTER MABE ZOX:[id number] DOB: [birth year] DOA: 04/26/2012 PCP: [name], MD  Assessment/Plan: Principal Problem:   Acute renal failure Active Problems:   Chronic systolic heart failure   Ventricular tachycardia   Hypovolemic shock   Stroke   Thyroid disease   Rectal bleeding   Diarrhea   Hypokalemia   Right rib fracture   Fall   Dehydration   Hemorrhoids with complication    1. Diarrhea: Patient presented with intermittent episodes of chronic diarrhea for over 1 year, worse this past month., and more recently associated with bloody stools. Initially commenced on Flagyl, but as C. Difficile PCR is negative, this has been discontinued. Managing with supportive care, and diarrhea is now less. Dr [name] provided GI consultation, and performed a colonoscopy on 04/29/12, to evaluate for IBD. Colonic mucosa appeared macroscopically normal, and biopsies were taken. Results are pending. Patient's diarrhea appears to have resolved.  2. Dehydration/Acute renal failure: On admission, creatinine was 8.48, BUN 41, consistent with ARF, against a known baseline creatinine of 0.9 in 12/22/11. Renal ultrasound was unremarkable. Etiologies include dehydration from diarrhea, as well as concomitant ACE, Lasix, Meloxicam. Dr Fayrene Fearing Deterding provided nephrology consultation. Managed as recommended, with iv fluids and avoidance of nephrotoxins. Following renal indices, and as of 04/29/12, ARF has resolved. Creatinine was 1.16 on 04/29/12.  3. Chronic systolic heart failure/EF 20%/Ventricular tachycardia : Patient has a known history of nonischemic cardiomyopathy, EF 20%, s/p bi-V defibrillator. No evidence of CHF decompensation at this time. Beta- Blocker on hold due to hypotension and ACE and Lasix due to dehydration/ARF. He did have some ventricular ectopy, likely due to electrolyte abnormalities, but this has resolved. 2 D Echocardiogram showed mildly  dilated LV cavity size, EF of 40% to 45%, grade 1 diastolic dysfunction and mildly dilated LA. Have recommenced Coreg on 04/29/12. Dr [name] has been consulted for AICD check, because of "firing" noted today on telemetry. .  4. Rectal bleeding: Patient has rather large and very impressive hemorrhoids on rectal inspection. Dr Axel Filler provided surgical consultation and has recommended conservative management with sitz baths, hemorrhoid creams, Anusol, tucks pads, and increased fiber. Rectal pain is less today. Surgical team will arrange for him to f/u with Dr. [name], colorectal surgeon on discharge.  5. Electrolyte abnormalities: These include Hypokalemia and hypomagnesemia. Repleting as indicated.  Replacing  6. Fall/Right rib fracture: Patient is s/p fall about a week ago, complicated by right seventh and eighth rib fracture. Managing with analgesics.   7. Hyperthyroid s/p ablation for Graves disease: On Thyroxine replacement therapy.    Code Status: Full Code.  Family Communication:  Disposition Plan: To be determined. Possible discharge on 05/01/12.    Brief narrative: 67 y.o. Caucasian male with history of nonischemic cardiomyopathy, EF 20%, status post insertion of bi-V defibrillator, history of alcohol abuse with cessation in January of 2014, stroke/TIAs, and hypothyroidism, presenting with diarrhea, dizziness and rectal bleed. He reports that his symptoms started about 1 and 1/2 weeks ago when he started having diarrhea. He has had intermittent episodes of chronic diarrhea for over 1 year now but he reports that this has been worse this past month. He has had a history of hemorrhoids with hemorrhoidal bleeding in the past, he has had screening colonoscopy approximately 2 and half years ago which was unremarkable. He has had intermittent episodes of bleeding with his diarrhea. About a week ago he has had a fall and had been to his primary care  physician's office where he was diagnosed  with right seventh and eighth rib fracture and was started on pain medication. However he has had continued diarrhea and has been feeling dizzy and lightheaded at home. Over the last 2 days, he had not made any urine, and he has had decrease in appetite. He had gone to his primary care physician's office again on 04/26/12, but referred to the ED for further evaluation, where he was found to be in acute renal failure with a creatinine of 8.48, the hospitalist service was consulted for further care and management.   Consultants:  Dr Beryle Lathe, nephrologist.   Dr [name], GI.   Procedures:  CXR.  Renal ultrasound.   Antibiotics:  Flagyl 04/26/12-04/27/12.   HPI/Subjective: No more diarrhea. Rectal pain is less.  Objective: Vital signs in last 24 hours: Temp:  [97.1 F (36.2 C)-98.3 F (36.8 C)] 97.6 F (36.4 C) (03/14 0924) Pulse Rate:  [63-73] 65 (03/14 0924) Resp:  [9-18] 18 (03/14 0924) BP: (91-167)/(39-101) 113/72 mmHg (03/14 0924) SpO2:  [98 %-100 %] 98 % (03/14 0924) Weight:  [107.9 kg (237 lb 14 oz)] 107.9 kg (237 lb 14 oz) (03/14 0731) Weight change:  Last BM Date: 04/30/12  Intake/Output from previous day: 03/13 0701 - 03/14 0700 In: 2954.2 [P.O.:1200; I.V.:1754.2] Out: 925 [Urine:925] Total I/O In: 480 [P.O.:480] Out: 300 [Urine:300]   Physical Exam: General: Comfortable, alert, communicative, fully oriented, not short of breath at rest.  HEENT:  No clinical pallor, no jaundice, no conjunctival injection or discharge. Hydration is fair.  NECK:  Supple, JVP not seen, no carotid bruits, no palpable lymphadenopathy, no palpable goiter. CHEST:  Clinically clear to auscultation, no wheezes, no crackles. HEART:  Sounds 1 and 2 heard, normal, regular, no murmurs. ABDOMEN:  Full, soft, non-tender, no palpable organomegaly, no palpable masses, normal bowel sounds. GENITALIA:  Not examined. RECTAL EXAMINATION: Not examined today.  LOWER EXTREMITIES:  No  pitting edema, palpable peripheral pulses. MUSCULOSKELETAL SYSTEM:  Generalized osteoarthritic changes, otherwise, normal. CENTRAL NERVOUS SYSTEM:  No focal neurologic deficit on gross examination.  Lab Results:  Recent Labs  04/29/12 0557 04/30/12 0555  WBC 7.3 7.0  HGB 12.8* 12.5*  HCT 37.3* 36.7*  PLT 171 189    Recent Labs  04/29/12 0557 04/30/12 0555  NA 143 142  K 3.5 3.6  CL 114* 111  CO2 18* 23  GLUCOSE 102* 100*  BUN 16 9  CREATININE 1.16 1.06  CALCIUM 8.3* 8.3*   Recent Results (from the past 240 hour(s))  URINE CULTURE     Status: None   Collection Time    04/26/12  8:27 PM      Result Value Range Status   Specimen Description URINE, RANDOM   Final   Special Requests NONE   Final   Culture  Setup Time 04/27/2012 03:07   Final   Colony Count NO GROWTH   Final   Culture NO GROWTH   Final   Report Status 04/27/2012 FINAL   Final  MRSA PCR SCREENING     Status: None   Collection Time    04/27/12 12:41 AM      Result Value Range Status   MRSA by PCR NEGATIVE  NEGATIVE Final   Comment:            The GeneXpert MRSA Assay (FDA     approved for NASAL specimens     only), is one component of a     comprehensive MRSA colonization  surveillance program. It is not     intended to diagnose MRSA     infection nor to guide or     monitor treatment for     MRSA infections.  CLOSTRIDIUM DIFFICILE BY PCR     Status: None   Collection Time    04/27/12  7:43 AM      Result Value Range Status   C difficile by pcr NEGATIVE  NEGATIVE Final     Studies/Results: No results found.  Medications: Scheduled Meds: . allopurinol  100 mg Oral Daily  . carvedilol  3.125 mg Oral BID WC  . folic acid  1 mg Oral Daily  . hydrocortisone  25 mg Rectal BID  . levothyroxine  112 mcg Oral QAC breakfast  . potassium chloride  20 mEq Oral Daily  . psyllium  1 packet Oral Daily  . thiamine  100 mg Oral Daily   Continuous Infusions: . dextrose 5 % and 0.45% NaCl 50 mL/hr  at 04/29/12 2227   PRN Meds:.acetaminophen, acetaminophen, lidocaine, morphine injection, ondansetron (ZOFRAN) IV, ondansetron, oxyCODONE, witch hazel-glycerin    LOS: 4 days   OTI,CHRISTOPHER  [facility] Pager [phone]. If 8PM-8AM, please contact night-coverage at [email or web address], password [facility] 04/30/2012, 10:37 AM  LOS: 4 days

## 2012-04-30 NOTE — Discharge Summary (Signed)
Physician Discharge Summary  Don [name] YNW:[id number] DOB: 1945-11-04 DOA: 04/26/2012  PCP: [name], MD  Admit date: 04/26/2012 Discharge date: 05/01/2012  Time spent: 40 minutes  Recommendations for Outpatient Follow-up:  1. Follow up with PMD. 2. Follow up with Dr [name], gastroenterologist.  3. Follow up with Dr [name], colorectal surgeon. 4. Follow up with Dr [name], cardiology/EPS.  Discharge Diagnoses:  Principal Problem:   Acute renal failure Active Problems:   Chronic systolic heart failure   Ventricular tachycardia   Hypovolemic shock   Stroke   Thyroid disease   Rectal bleeding   Diarrhea   Hypokalemia   Right rib fracture   Fall   Dehydration   Hemorrhoids with complication   Discharge Condition: Satisfactory.  Diet recommendation: Heart-Healthy.   Filed Weights   04/29/12 0535 04/30/12 0731 05/01/12 0509  Weight: 106 kg (233 lb 11 oz) 107.9 kg (237 lb 14 oz) 106.142 kg (234 lb)    History of present illness:  67 y.o. Caucasian male with history of nonischemic cardiomyopathy, EF 20%, status post insertion of bi-V defibrillator, history of alcohol abuse with cessation in January of 2014, stroke/TIAs, and hypothyroidism, presenting with diarrhea, dizziness and rectal bleed. He reports that his symptoms started about 1 and 1/2 weeks ago when he started having diarrhea. He has had intermittent episodes of chronic diarrhea for over 1 year now but he reports that this has been worse this past month. He has had a history of hemorrhoids with hemorrhoidal bleeding in the past, he has had screening colonoscopy approximately 2 and half years ago which was unremarkable. He has had intermittent episodes of bleeding with his diarrhea. About a week ago he has had a fall and had been to his primary care physician's office where he was diagnosed with right seventh and eighth rib fracture and was started on pain medication. However he has had  continued diarrhea and has been feeling dizzy and lightheaded at home. Over the last 2 days, he had not made any urine, and he has had decrease in appetite. He had gone to his primary care physician's office again on 04/26/12, but referred to the ED for further evaluation, where he was found to be in acute renal failure with a creatinine of 8.48, the hospitalist service was consulted for further care and management.   Hospital Course:  1. Diarrhea: Patient presented with intermittent episodes of chronic diarrhea for over 1 year, worse this past month, and more recently associated with bloody stools. Initially commenced on Flagyl, but as C. Difficile PCR was negative, this was been discontinued. Managed with supportive care, and as of 04/30/12, diarrhea had resolved. Dr [name] provided GI consultation, and performed a colonoscopy on 04/29/12, to evaluate for IBD. Colonic mucosa appeared macroscopically normal, and biopsies were taken. Results are pending. Patient will follow up with Dr Randa Evens, on discharge. .  2. Dehydration/Acute renal failure: On admission, creatinine was 8.48, BUN 41, consistent with ARF, against a known baseline creatinine of 0.9 in 12/22/11. Renal ultrasound was unremarkable. Etiologies include dehydration from diarrhea, as well as concomitant ACE, Lasix, Meloxicam. Dr Fayrene Fearing Deterding provided nephrology consultation. Managed as recommended, with iv fluids and avoidance of nephrotoxins. Following renal indices, and as of 04/29/12, ARF has resolved. Creatinine was 1.16 on 04/29/12.  3. Chronic systolic heart failure/EF 20%/Ventricular tachycardia : Patient has a known history of nonischemic cardiomyopathy, EF 20%, s/p bi-V defibrillator. He had no evidence of CHF decompensation during the course of hospitalization,  and Beta- Blocker were placed on hold due to hypotension and as described above, ACE and Lasix were held, due to dehydration/ARF. He did have some ventricular ectopy, likely  due to electrolyte abnormalities, but this was brief, and did not recur. 2 D Echocardiogram showed mildly dilated LV cavity size, EF of 40% to 45%, grade 1 diastolic dysfunction and mildly dilated LA. Coreg was recommended on 04/29/12, without deleterious effect. Dr [name] was consulted for AICD check on 04/30/12, reviewed ICD interrogation, concluded that device is pacing and sensing appropriately. No programming changes made. Pre-admission Lasix/ACE-i have been recommenced as of 04/30/12.  4. Rectal bleeding: Patient has rather large and very impressive hemorrhoids on rectal inspection. Dr Axel Filler provided surgical consultation and has recommended conservative management with sitz baths, Anusol, tucks pads, hemorrhoid cream and increased fiber. Surgical team will arrange for him to follow up with Dr. [name], colorectal surgeon on discharge.  5. Electrolyte abnormalities: These include Hypokalemia and hypomagnesemia. Repleted as indicated.  6. Fall/Right rib fracture: Patient is s/p fall about a week ago, complicated by right seventh and eighth rib fracture. Managed with analgesics.  7. Hyperthyroid s/p ablation for Graves disease: On Thyroxine replacement therapy.    Procedures:  See Below.  Colonoscopy.   Consultations: Dr Beryle Lathe, nephrologist.  Dr [name], GI.   Discharge Exam: Filed Vitals:   04/30/12 1836 04/30/12 2016 05/01/12 0509 05/01/12 1140  BP: 134/72 116/68 115/76 131/74  Pulse: 79 78 60 65  Temp:  98.3 F (36.8 C) 97.5 F (36.4 C)   TempSrc:  Oral Oral   Resp:  18 18   Height:      Weight:   106.142 kg (234 lb)   SpO2:  100% 99%      Discharge Instructions      Discharge Orders   Future Orders Complete By Expires     Diet - low sodium heart healthy  As directed     Increase activity slowly  As directed         Medication List    STOP taking these medications       meloxicam 15 MG tablet  Commonly known as:  MOBIC      TAKE  these medications       allopurinol 100 MG tablet  Commonly known as:  ZYLOPRIM  Take 100 mg by mouth daily.     aspirin 81 MG tablet  Take 81 mg by mouth daily.     atorvastatin 40 MG tablet  Commonly known as:  LIPITOR  Take 40 mg by mouth daily.     benazepril 20 MG tablet  Commonly known as:  LOTENSIN  Take 20 mg by mouth daily.     carvedilol 12.5 MG tablet  Commonly known as:  COREG  Take 12.5 mg by mouth 2 (two) times daily with a meal.     furosemide 40 MG tablet  Commonly known as:  LASIX  Take 40 mg by mouth daily.     HYDROcodone-acetaminophen 5-325 MG per tablet  Commonly known as:  NORCO/VICODIN  Take 1 tablet by mouth every 6 (six) hours as needed for pain.     hydrocortisone 25 MG suppository  Commonly known as:  ANUSOL-HC  Place 1 suppository (25 mg total) rectally 2 (two) times daily.     KAOPECTATE 262 MG/15ML suspension  Generic drug:  bismuth subsalicylate  Take 15 mLs by mouth every 6 (six) hours as needed for indigestion.  levothyroxine 112 MCG tablet  Commonly known as:  SYNTHROID, LEVOTHROID  Take 112 mcg by mouth daily.     lidocaine 4 % cream  Commonly known as:  LMX  Apply topically 4 (four) times daily as needed (apply to anus for pain).     nitroGLYCERIN 0.4 MG SL tablet  Commonly known as:  NITROSTAT  Place 0.4 mg under the tongue every 5 (five) minutes as needed for chest pain.     potassium chloride SA 20 MEQ tablet  Commonly known as:  K-DUR,KLOR-CON  Take 1 tablet (20 mEq total) by mouth daily.     psyllium 95 % Pack  Commonly known as:  HYDROCIL/METAMUCIL  Take 1 packet by mouth daily.     thiamine 100 MG tablet  Take 1 tablet (100 mg total) by mouth daily.     witch hazel-glycerin pad  Commonly known as:  TUCKS  Apply topically as needed. To anus.       Follow-up Information   Schedule an appointment as soon as possible for a visit with [name], MD.   Contact information:   [address] [place] [address] [phone]       Schedule an appointment as soon as possible for a visit with [name], MD.   Contact information:   [address] [place] [address] [phone]       Schedule an appointment as soon as possible for a visit with [facility] Burna Mortimer, MD.   Contact information:   [address] [place]                         [place] [address] [phone]       Schedule an appointment as soon as possible for a visit with Vanita Panda., MD.   Contact information:   [address], [address] [place] [address] [phone]        The results of significant diagnostics from this hospitalization (including imaging, microbiology, ancillary and laboratory) are listed below for reference.    Significant Diagnostic Studies: US Renal  04/26/2012  *RADIOLOGY REPORT*  Clinical Data: Acute renal failure  RENAL/URINARY TRACT ULTRASOUND COMPLETE  Technique:  Sonography of the kidneys and urinary bladder performed.  Examination limited by body habitus.  Comparison:  None  Findings:  Right Kidney:  12.1 cm length.  Normal cortical thickness.  Grossly normal cortical echogenicity.  No mass, hydronephrosis shadowing calcification.  No perinephric fluid.  Left Kidney:  11.5 cm length.  Normal cortical thickness echogenicity.  No gross mass, hydronephrosis shadowing calcification.  No perinephric fluid.  Bladder:  Partially distended, unremarkable.  Ureteral jets were not visualized during the period of imaging.  IMPRESSION: No evidence of renal mass or urinary tract obstruction.   Original Report Authenticated By: Ulyses Southward, M.D.    Dg Chest Portable 1 View  04/26/2012  *RADIOLOGY REPORT*  Clinical Data: Rectal bleeding, former smoking history  PORTABLE CHEST - 1 VIEW  Comparison: Chest x-ray of 04/19/2012  Findings: No active infiltrate or effusion is seen.  Mild atelectasis or scarring remains at the left lung base.  Cardiomegaly is stable and AICD lead remains.  No bony abnormality is noted.  IMPRESSION: Stable cardiomegaly with defibrillator.  No active lung disease. Probable left basilar atelectasis or scarring.   Original Report Authenticated By: Dwyane Dee, M.D.     Microbiology: Recent Results (from the past 240  hour(s))  URINE CULTURE     Status: None   Collection Time    04/26/12  8:27 PM      Result Value Range Status   Specimen Description URINE, RANDOM   Final   Special Requests NONE   Final   Culture  Setup Time 04/27/2012 03:07   Final   Colony Count NO GROWTH   Final   Culture NO GROWTH   Final   Report Status 04/27/2012 FINAL   Final  MRSA PCR SCREENING     Status: None   Collection Time    04/27/12 12:41 AM      Result Value Range Status   MRSA by PCR NEGATIVE  NEGATIVE Final   Comment:            The GeneXpert MRSA Assay (FDA     approved for NASAL specimens     only), is one component of a     comprehensive MRSA colonization     surveillance program. It is not     intended to diagnose MRSA     infection nor to guide or     monitor treatment for     MRSA infections.  CLOSTRIDIUM DIFFICILE BY PCR     Status: None   Collection Time    04/27/12  7:43 AM      Result Value Range Status   C difficile by pcr NEGATIVE  NEGATIVE Final     Labs: Basic Metabolic Panel:  Recent Labs Lab 04/26/12 1545 04/27/12 0330  04/28/12 0510 04/28/12 0854 04/28/12 1608 04/29/12 0557 04/30/12 0555 05/01/12 0540  NA 140 138  < > 146* 144 142 143 142 143  K 3.4* 2.6*  < > 2.7* 3.5 3.5 3.5 3.6 3.6  CL 101 103  < > 113* 113* 111 114* 111 111  CO2 20 18*  < > 21 22 21  18* 23 23  GLUCOSE 92 88  < > 104* 116* 116* 102* 100* 96  BUN 41* 39*  < > 33* 30* 25* 16 9 6   CREATININE 8.48* 7.56*  < > 2.97* 2.48* 1.79* 1.16 1.06 0.91  CALCIUM 7.9* 7.3*  < > 7.8* 7.9* 8.1* 8.3* 8.3* 8.4  MG  --  1.6  --  2.3  --   --  1.8 1.6  --   PHOS  --  6.9*  --   --   --   --  2.1*  --   --   < > = values  in this interval not displayed. Liver Function Tests:  Recent Labs Lab 04/26/12 1545 04/27/12 0330 04/29/12 0557  AST 14 13  --   ALT 10 9  --   ALKPHOS 62 57  --   BILITOT 0.2* 0.2*  --   PROT 6.4 5.9*  --   ALBUMIN 3.0* 2.7* 2.7*   No results found for this basename: LIPASE, AMYLASE,  in the last 168 hours No results found for this basename: AMMONIA,  in the last 168 hours CBC:  Recent Labs Lab 04/26/12 1545 04/26/12 1915  04/27/12 1250 04/27/12 1828 04/29/12 0557 04/30/12 0555 05/01/12 0540  WBC 8.6  --   < > 7.2 6.9 7.3 7.0 6.1  NEUTROABS 6.0 4.6  --   --   --   --   --   --   HGB 13.3  --   < > 12.8* 13.3 12.8* 12.5* 13.3  HCT 38.4*  --   < > 36.8* 37.7* 37.3*  36.7* 37.9*  MCV 87.5  --   < > 86.6 86.1 87.4 87.8 85.2  PLT 164  --   < > 175 184 171 189 169  < > = values in this interval not displayed. Cardiac Enzymes: No results found for this basename: CKTOTAL, CKMB, CKMBINDEX, TROPONINI,  in the last 168 hours BNP: BNP (last 3 results) No results found for this basename: PROBNP,  in the last 8760 hours CBG: No results found for this basename: GLUCAP,  in the last 168 hours     Signed:  Anasia Agro,CHRISTOPHER  [facility] 05/01/2012, 1:05 PM

## 2012-04-30 NOTE — Progress Notes (Signed)
   ELECTROPHYSIOLOGY DEVICE INTERROGATION    Device Manufacturer: Medtronic Model Number: [id number] DOI: 12-25-2011 Implanting physician: Ladona Ridgel  Device following physician: Ladona Ridgel  Battery Voltage: 3.18  Charge Time: 8.4       RA Lead (5076) RV Lead (6947) LV Lead   Amplitude 2.6 4.1   Impedence 475 646   Threshold 0.75V@0 .4 0.75V@0 .4 1.0V@0 .4  HV impedence  45/55    Episodes:  High Atrial rates: none  High Ventricular rates: none  Programmed parameters:  Brady parameters:  Mode: DDD Lower Rate: 60 Upper rate: 130  PAV: 130   SAV: 150  Tachy parameters:  VT1 zone: Rate: 200   VT2 zone: Rate: 200-250   VF zone:   Rate: 171   Changes this session: None  Normal device function.  No changes made this session.  Frequent ventricular ectopy, but device is responding appropriately.  CRT pacing 94% of the time.  Optivol was elevated, now flat.  R waves chronic around 3-66mV.   Dr. Ladona Ridgel to review and sign off on device interrogation.  Don Balsam, RN, BSN, CCDS 04/30/2012 11:49 AM  EP Attending  I have reviewed ICD interogation. Note call for abnormal tele to see if ICD working appropriately. His device is pacing and sensing appropriately. No programming changes made today.  Don [name].D.

## 2012-05-01 LAB — CBC
MCH: 29.9 pg (ref 26.0–34.0)
MCHC: 35.1 g/dL (ref 30.0–36.0)
MCV: 85.2 fL (ref 78.0–100.0)
Platelets: 169 10*3/uL (ref 150–400)
RDW: 13.8 % (ref 11.5–15.5)

## 2012-05-01 LAB — BASIC METABOLIC PANEL
Calcium: 8.4 mg/dL (ref 8.4–10.5)
Creatinine, Ser: 0.91 mg/dL (ref 0.50–1.35)
GFR calc Af Amer: >90 mL/min (ref >90)
GFR calc non Af Amer: 86 mL/min — ABNORMAL LOW (ref >90)

## 2012-05-01 MED ORDER — THIAMINE HCL 100 MG PO TABS: 100.0000 mg | ORAL_TABLET | Freq: Every day | ORAL | Status: DC

## 2012-05-01 MED ORDER — PSYLLIUM 95 % PO PACK: 1.0000 | PACK | Freq: Every day | ORAL | Status: DC

## 2012-05-01 MED ORDER — LIDOCAINE 4 % EX CREA: TOPICAL_CREAM | Freq: Four times a day (QID) | CUTANEOUS | Status: DC | PRN

## 2012-05-01 MED ORDER — HYDROCODONE-ACETAMINOPHEN 5-325 MG PO TABS: 1.0000 | ORAL_TABLET | Freq: Four times a day (QID) | ORAL | Status: DC | PRN

## 2012-05-01 MED ORDER — POTASSIUM CHLORIDE CRYS ER 20 MEQ PO TBCR: 20.0000 meq | EXTENDED_RELEASE_TABLET | Freq: Every day | ORAL | Status: DC

## 2012-05-01 MED ORDER — WITCH HAZEL-GLYCERIN EX PADS: MEDICATED_PAD | CUTANEOUS | Status: DC | PRN

## 2012-05-01 MED ORDER — HYDROCORTISONE ACETATE 25 MG RE SUPP: 25.0000 mg | Freq: Two times a day (BID) | RECTAL | Status: DC

## 2012-05-01 NOTE — Progress Notes (Signed)
Don [name] to be D/C'd Home per MD order.  Discussed with the patient and all questions fully answered.    Medication List    STOP taking these medications       meloxicam 15 MG tablet  Commonly known as:  MOBIC      TAKE these medications       allopurinol 100 MG tablet  Commonly known as:  ZYLOPRIM  Take 100 mg by mouth daily.     aspirin 81 MG tablet  Take 81 mg by mouth daily.     atorvastatin 40 MG tablet  Commonly known as:  LIPITOR  Take 40 mg by mouth daily.     benazepril 20 MG tablet  Commonly known as:  LOTENSIN  Take 20 mg by mouth daily.     carvedilol 12.5 MG tablet  Commonly known as:  COREG  Take 12.5 mg by mouth 2 (two) times daily with a meal.     furosemide 40 MG tablet  Commonly known as:  LASIX  Take 40 mg by mouth daily.     HYDROcodone-acetaminophen 5-325 MG per tablet  Commonly known as:  NORCO/VICODIN  Take 1 tablet by mouth every 6 (six) hours as needed for pain.     hydrocortisone 25 MG suppository  Commonly known as:  ANUSOL-HC  Place 1 suppository (25 mg total) rectally 2 (two) times daily.     KAOPECTATE 262 MG/15ML suspension  Generic drug:  bismuth subsalicylate  Take 15 mLs by mouth every 6 (six) hours as needed for indigestion.     levothyroxine 112 MCG tablet  Commonly known as:  SYNTHROID, LEVOTHROID  Take 112 mcg by mouth daily.     lidocaine 4 % cream  Commonly known as:  LMX  Apply topically 4 (four) times daily as needed (apply to anus for pain).     nitroGLYCERIN 0.4 MG SL tablet  Commonly known as:  NITROSTAT  Place 0.4 mg under the tongue every 5 (five) minutes as needed for chest pain.     potassium chloride SA 20 MEQ tablet  Commonly known as:  K-DUR,KLOR-CON  Take 1 tablet (20 mEq total) by mouth daily.     psyllium 95 % Pack  Commonly known as:  HYDROCIL/METAMUCIL  Take 1 packet by mouth daily.     thiamine 100 MG tablet  Take 1 tablet (100 mg total) by mouth daily.     witch hazel-glycerin  pad  Commonly known as:  TUCKS  Apply topically as needed. To anus.        VVS, Skin clean, dry and intact without evidence of skin break down, no evidence of skin tears noted. IV catheter discontinued intact. Site without signs and symptoms of complications. Dressing and pressure applied.  An After Visit Summary was printed and given to the patient. Patient escorted via WC, and D/C home via private auto.  Don [name] 05/01/2012 3:24 PM

## 2012-05-19 ENCOUNTER — Encounter: Payer: Self-pay | Admitting: Internal Medicine

## 2012-05-19 ENCOUNTER — Ambulatory Visit (INDEPENDENT_AMBULATORY_CARE_PROVIDER_SITE_OTHER): Payer: Medicare Other | Admitting: *Deleted

## 2012-05-19 ENCOUNTER — Other Ambulatory Visit: Payer: Self-pay | Admitting: Internal Medicine

## 2012-05-19 DIAGNOSIS — I472 Ventricular tachycardia, unspecified: Secondary | ICD-10-CM

## 2012-05-19 DIAGNOSIS — I5022 Chronic systolic (congestive) heart failure: Secondary | ICD-10-CM

## 2012-05-19 DIAGNOSIS — Z95810 Presence of automatic (implantable) cardiac defibrillator: Secondary | ICD-10-CM

## 2012-05-21 LAB — REMOTE ICD DEVICE
AL AMPLITUDE: 2.1 mv
AL IMPEDENCE ICD: 456 Ohm
ATRIAL PACING ICD: 58.78 pct
BAMS-0001: 170 {beats}/min
CHARGE TIME: 3.483 s
FVT: 0
LV LEAD THRESHOLD: 1.375 V
RV LEAD IMPEDENCE ICD: 703 Ohm
TOT-0001: 1
TZAT-0001ATACH: 2
TZAT-0001SLOWVT: 1
TZAT-0002ATACH: NEGATIVE
TZAT-0005SLOWVT: 88 pct
TZAT-0011FASTVT: 10 ms
TZAT-0012ATACH: 150 ms
TZAT-0012ATACH: 150 ms
TZAT-0012FASTVT: 170 ms
TZAT-0012SLOWVT: 170 ms
TZAT-0013SLOWVT: 2
TZAT-0018ATACH: NEGATIVE
TZAT-0018FASTVT: NEGATIVE
TZAT-0018SLOWVT: NEGATIVE
TZAT-0019ATACH: 6 V
TZAT-0019FASTVT: 8 V
TZAT-0020ATACH: 1.5 ms
TZAT-0020ATACH: 1.5 ms
TZAT-0020SLOWVT: 1.5 ms
TZON-0003ATACH: 350 ms
TZON-0003FASTVT: 240 ms
TZON-0003VSLOWVT: 450 ms
TZON-0004SLOWVT: 16
TZON-0004VSLOWVT: 20
TZON-0005SLOWVT: 12
TZST-0001ATACH: 4
TZST-0001FASTVT: 3
TZST-0001FASTVT: 6
TZST-0001SLOWVT: 2
TZST-0001SLOWVT: 5
TZST-0003FASTVT: 35 J
TZST-0003FASTVT: 35 J
TZST-0003SLOWVT: 35 J
TZST-0003SLOWVT: 35 J
TZST-0003SLOWVT: 35 J

## 2012-06-11 ENCOUNTER — Encounter: Payer: Self-pay | Admitting: *Deleted

## 2012-08-15 ENCOUNTER — Encounter (HOSPITAL_COMMUNITY): Payer: Self-pay | Admitting: *Deleted

## 2012-08-15 ENCOUNTER — Observation Stay (HOSPITAL_COMMUNITY)
Admission: EM | Admit: 2012-08-15 | Discharge: 2012-08-17 | Disposition: A | Discharge status: Home / Self Care | Payer: Medicare Other | Attending: Internal Medicine | Admitting: Internal Medicine

## 2012-08-15 ENCOUNTER — Other Ambulatory Visit: Payer: Self-pay

## 2012-08-15 DIAGNOSIS — Z79899 Other long term (current) drug therapy: Secondary | ICD-10-CM | POA: Insufficient documentation

## 2012-08-15 DIAGNOSIS — F1010 Alcohol abuse, uncomplicated: Secondary | ICD-10-CM

## 2012-08-15 DIAGNOSIS — R571 Hypovolemic shock: Secondary | ICD-10-CM

## 2012-08-15 DIAGNOSIS — N179 Acute kidney failure, unspecified: Secondary | ICD-10-CM

## 2012-08-15 DIAGNOSIS — E039 Hypothyroidism, unspecified: Secondary | ICD-10-CM | POA: Insufficient documentation

## 2012-08-15 DIAGNOSIS — I10 Essential (primary) hypertension: Secondary | ICD-10-CM | POA: Insufficient documentation

## 2012-08-15 DIAGNOSIS — K625 Hemorrhage of anus and rectum: Secondary | ICD-10-CM

## 2012-08-15 DIAGNOSIS — G459 Transient cerebral ischemic attack, unspecified: Principal | ICD-10-CM

## 2012-08-15 DIAGNOSIS — I472 Ventricular tachycardia, unspecified: Secondary | ICD-10-CM

## 2012-08-15 DIAGNOSIS — I426 Alcoholic cardiomyopathy: Secondary | ICD-10-CM | POA: Insufficient documentation

## 2012-08-15 DIAGNOSIS — R197 Diarrhea, unspecified: Secondary | ICD-10-CM

## 2012-08-15 DIAGNOSIS — I5022 Chronic systolic (congestive) heart failure: Secondary | ICD-10-CM

## 2012-08-15 DIAGNOSIS — K648 Other hemorrhoids: Secondary | ICD-10-CM

## 2012-08-15 DIAGNOSIS — Z95810 Presence of automatic (implantable) cardiac defibrillator: Secondary | ICD-10-CM

## 2012-08-15 DIAGNOSIS — E876 Hypokalemia: Secondary | ICD-10-CM

## 2012-08-15 DIAGNOSIS — E860 Dehydration: Secondary | ICD-10-CM

## 2012-08-15 DIAGNOSIS — E079 Disorder of thyroid, unspecified: Secondary | ICD-10-CM

## 2012-08-15 DIAGNOSIS — I639 Cerebral infarction, unspecified: Secondary | ICD-10-CM

## 2012-08-15 HISTORY — DX: Atherosclerotic heart disease of native coronary artery without angina pectoris: I25.10

## 2012-08-15 LAB — COMPREHENSIVE METABOLIC PANEL
AST: 44 U/L — ABNORMAL HIGH (ref 0–37)
Albumin: 3.5 g/dL (ref 3.5–5.2)
Alkaline Phosphatase: 79 U/L (ref 39–117)
BUN: 8 mg/dL (ref 6–23)
CO2: 28 mEq/L (ref 19–32)
Chloride: 97 mEq/L (ref 96–112)
Creatinine, Ser: 0.72 mg/dL (ref 0.50–1.35)
GFR calc non Af Amer: >90 mL/min (ref >90)
Potassium: 3.3 mEq/L — ABNORMAL LOW (ref 3.5–5.1)
Total Bilirubin: 0.8 mg/dL (ref 0.3–1.2)

## 2012-08-15 LAB — CBC
HCT: 41.3 % (ref 39.0–52.0)
MCV: 86 fL (ref 78.0–100.0)
Platelets: 186 10*3/uL (ref 150–400)
RBC: 4.8 MIL/uL (ref 4.22–5.81)
RDW: 17.5 % — ABNORMAL HIGH (ref 11.5–15.5)
WBC: 9.1 10*3/uL (ref 4.0–10.5)

## 2012-08-15 NOTE — ED Notes (Signed)
The pt has numerus complaints.  He lives alone and for one week he has had dizziness.  Weakness all day and some transient numbness.  Alert hx of tias and other health problems

## 2012-08-15 NOTE — ED Provider Notes (Signed)
History    CSN: [id number] Arrival date & time 08/15/12  2143  First MD Initiated Contact with Patient 08/15/12 2304     Chief Complaint  Patient presents with  . Dizziness   (Consider location/radiation/quality/duration/timing/severity/associated sxs/prior Treatment) HPI Hx per PT - R arm and facial numbness, mild with onset this afternoon around lunch time. On and off, feels better now but still present. Has h/o TIAs in the past.  No h/o CVA.  Last 3-4 days has nausea no emesis.  No CP or SOB. Some mild HAs, quit drinking and smoking a few months ago, started up again 3 weeks ago. feels dizzy today - has had on and off unsteady gait. Tonight it is better. He denies any etoh use tonight.   Past Medical History  Diagnosis Date  . Nonischemic cardiomyopathy     EF 20% s/p INSERTION OF BI-V DEFIBRILLATOR.  . Chronic systolic heart failure     LEFT  . LBBB (left bundle branch block)   . Alcohol abuse     PREVIOUS H/O DRINKING A FIFTH OF VODKA  A DAY  . History of angina   . Stroke     TIA's  . Thyroid disease     HYPOTHYROIDISM.....TREATED  . ICD (implantable cardiac defibrillator) in place   . CHF (congestive heart failure)   . Anginal pain   . Chronic kidney disease     acute renal  04/2012  . Arthritis     hands & back  . Coronary artery disease    Past Surgical History  Procedure Laterality Date  . Insert / replace / remove pacemaker      BI-V IMPLANTABLE CARDIOVERTER-DEFIBRILLATOR. ; [employer]; MODEL  # [id number], SERIAL # [id number] H.  DR. Humberto Leep EDMUNDS.  Marland Kitchen Cardiac catheterization    . Tonsillectomy    . Cystoscopy w/ stone manipulation    . Vasectomy    . Flexible sigmoidoscopy N/A 04/29/2012    Procedure: FLEXIBLE SIGMOIDOSCOPY;  Surgeon: Vertell Novak., MD;  Location: [facility] ENDOSCOPY;  Service: Endoscopy;  Laterality: N/A;   Family History  Problem Relation Age of Onset  . Heart disease Mother   . Heart disease Father   . Cancer    . Hypertension      History  Substance Use Topics  . Smoking status: Former Smoker -- 43 years    Quit date: 02/27/2012  . Smokeless tobacco: Never Used  . Alcohol Use: No     Comment: H/O ALCOHOLISM PERVIOUSLY A FIFTH OF VODKA A DAY    Review of Systems  Constitutional: Negative for fever and chills.  HENT: Negative for neck pain and neck stiffness.   Eyes: Negative for visual disturbance.  Respiratory: Negative for shortness of breath.   Cardiovascular: Negative for chest pain.  Gastrointestinal: Negative for abdominal pain.  Genitourinary: Negative for dysuria.  Musculoskeletal: Negative for back pain.  Skin: Negative for rash.  Neurological: Positive for dizziness and numbness. Negative for syncope, facial asymmetry and headaches.  All other systems reviewed and are negative.    Allergies  Sulfa antibiotics  Home Medications   Current Outpatient Rx  Name  Route  Sig  Dispense  Refill  . allopurinol (ZYLOPRIM) 100 MG tablet   Oral   Take 100 mg by mouth daily.          Marland Kitchen aspirin 81 MG tablet   Oral   Take 81 mg by mouth daily.           Marland Kitchen  atorvastatin (LIPITOR) 40 MG tablet   Oral   Take 40 mg by mouth daily.          . benazepril (LOTENSIN) 20 MG tablet   Oral   Take 20 mg by mouth daily.         . carvedilol (COREG) 6.25 MG tablet   Oral   Take 6.25 mg by mouth 2 (two) times daily with a meal.         . furosemide (LASIX) 20 MG tablet   Oral   Take 20 mg by mouth daily.         Marland Kitchen levothyroxine (SYNTHROID, LEVOTHROID) 112 MCG tablet   Oral   Take 112 mcg by mouth daily.         . nitroGLYCERIN (NITROSTAT) 0.4 MG SL tablet   Sublingual   Place 0.4 mg under the tongue every 5 (five) minutes as needed for chest pain.          Marland Kitchen thiamine 100 MG tablet   Oral   Take 1 tablet (100 mg total) by mouth daily.   30 tablet   0    BP 168/97  Pulse 76  Temp(Src) 98.1 F (36.7 C) (Oral)  Resp 16  SpO2 97% Physical Exam  Constitutional: He is  oriented to person, place, and time. He appears well-developed and well-nourished.  HENT:  Head: Normocephalic and atraumatic.  Mouth/Throat: Oropharynx is clear and moist.  Eyes: EOM are normal. Pupils are equal, round, and reactive to light.  Neck: Neck supple.  Cardiovascular: Normal rate, regular rhythm and intact distal pulses.   Pulmonary/Chest: Effort normal and breath sounds normal. No respiratory distress.  Abdominal: Soft. Bowel sounds are normal. He exhibits no distension. There is no tenderness.  Musculoskeletal: Normal range of motion. He exhibits no edema.  Neurological: He is alert and oriented to person, place, and time. He has normal reflexes. He displays normal reflexes. No cranial nerve deficit. He exhibits normal muscle tone. Coordination normal.  Normal gait intact, no nystagmus   Skin: Skin is warm and dry.    ED Course  Procedures (including critical care time)  Results for orders placed during the hospital encounter of 08/15/12  TROPONIN I      Result Value Range   Troponin I <0.30  <0.30 ng/mL  CBC      Result Value Range   WBC 9.1  4.0 - 10.5 K/uL   RBC 4.80  4.22 - 5.81 MIL/uL   Hemoglobin 14.5  13.0 - 17.0 g/dL   HCT 16.1  09.6 - 04.5 %   MCV 86.0  78.0 - 100.0 fL   MCH 30.2  26.0 - 34.0 pg   MCHC 35.1  30.0 - 36.0 g/dL   RDW 40.9 (*) 81.1 - 91.4 %   Platelets 186  150 - 400 K/uL  COMPREHENSIVE METABOLIC PANEL      Result Value Range   Sodium 134 (*) 135 - 145 mEq/L   Potassium 3.3 (*) 3.5 - 5.1 mEq/L   Chloride 97  96 - 112 mEq/L   CO2 28  19 - 32 mEq/L   Glucose, Bld 109 (*) 70 - 99 mg/dL   BUN 8  6 - 23 mg/dL   Creatinine, Ser 7.82  0.50 - 1.35 mg/dL   Calcium 8.4  8.4 - 95.6 mg/dL   Total Protein 6.7  6.0 - 8.3 g/dL   Albumin 3.5  3.5 - 5.2 g/dL   AST 44 (*) 0 -  37 U/L   ALT 25  0 - 53 U/L   Alkaline Phosphatase 79  39 - 117 U/L   Total Bilirubin 0.8  0.3 - 1.2 mg/dL   GFR calc non Af Amer >90  >90 mL/min   GFR calc Af Amer >90  >90  mL/min  TROPONIN I      Result Value Range   Troponin I <0.30  <0.30 ng/mL  LIPID PANEL      Result Value Range   Cholesterol 139  0 - 200 mg/dL   Triglycerides 45  <213 mg/dL   HDL 086  >57 mg/dL   Total CHOL/HDL Ratio 1.3     VLDL 9  0 - 40 mg/dL   LDL Cholesterol 25  0 - 99 mg/dL  AMMONIA      Result Value Range   Ammonia 31  11 - 60 umol/L  MAGNESIUM      Result Value Range   Magnesium 1.8  1.5 - 2.5 mg/dL   Dg Chest 2 View  8/46/9629   *RADIOLOGY REPORT*  Clinical Data: Stroke.  Left sided facial paralysis.  CHEST - 2 VIEW  Comparison: 04/26/2012.  Findings: Unchanged left subclavian AICD.  Cardiopericardial silhouette appears within normal limits.  Trachea midline.  No airspace disease.  No pleural effusion.  Eventration of the right hemidiaphragm.  IMPRESSION: No acute cardiopulmonary disease.   Original Report Authenticated By: Andreas Newport, M.D.   Ct Head Wo Contrast  08/16/2012   *RADIOLOGY REPORT*  Clinical Data: Dizziness, weakness, and numbness  CT HEAD WITHOUT CONTRAST  Technique:  Contiguous axial images were obtained from the base of the skull through the vertex without contrast.  Comparison: None.  Findings: There is no acute intracranial hemorrhage or infarct.  No midline shift or mass lesion.  Subtle hypoattenuation within the periventricular white matter is consistent with chronic small vessel ischemic changes.  This is unchanged.  No extra-axial fluid collection.  Calvarium is intact.  Visualized sinuses and mastoid air cells are clear.  IMPRESSION: Stable sequelae of chronic small vessel ischemic changes.  No acute intracranial process.   Original Report Authenticated By: Rise Mu, M.D.   NEU evaluated bedside - plan MED admit  [facility] hospitalist consulted and will admit TIA work up - except for MRI 2/2 ICD  MDM  TIA CT. ECG. Labs NEU consult MED admit  Sunnie Nielsen, MD 08/16/12 [phone]

## 2012-08-16 ENCOUNTER — Encounter (HOSPITAL_COMMUNITY): Payer: Self-pay | Admitting: Radiology

## 2012-08-16 ENCOUNTER — Emergency Department (HOSPITAL_COMMUNITY): Payer: Medicare Other

## 2012-08-16 ENCOUNTER — Observation Stay (HOSPITAL_COMMUNITY): Payer: Medicare Other

## 2012-08-16 DIAGNOSIS — I6350 Cerebral infarction due to unspecified occlusion or stenosis of unspecified cerebral artery: Secondary | ICD-10-CM

## 2012-08-16 DIAGNOSIS — G459 Transient cerebral ischemic attack, unspecified: Secondary | ICD-10-CM

## 2012-08-16 LAB — LIPID PANEL
Cholesterol: 139 mg/dL (ref 0–200)
Triglycerides: 45 mg/dL (ref <150)

## 2012-08-16 LAB — HEMOGLOBIN A1C
Hgb A1c MFr Bld: 5 % (ref <5.7)
Mean Plasma Glucose: 97 mg/dL (ref <117)

## 2012-08-16 LAB — TROPONIN I: Troponin I: <0.3 ng/mL (ref <0.30)

## 2012-08-16 LAB — MAGNESIUM: Magnesium: 1.8 mg/dL (ref 1.5–2.5)

## 2012-08-16 MED ORDER — ENOXAPARIN SODIUM 40 MG/0.4ML ~~LOC~~ SOLN
40.0000 mg | Freq: Every day | SUBCUTANEOUS | Status: DC
  Administered 2012-08-16 – 2012-08-17 (×2): 40 mg via SUBCUTANEOUS
  Filled 2012-08-16 (×2): qty 0.4

## 2012-08-16 MED ORDER — ADULT MULTIVITAMIN W/MINERALS CH
1.0000 | ORAL_TABLET | Freq: Every day | ORAL | Status: DC
  Administered 2012-08-17: 1 via ORAL
  Filled 2012-08-16 (×2): qty 1

## 2012-08-16 MED ORDER — ACETAMINOPHEN 325 MG PO TABS
650.0000 mg | ORAL_TABLET | Freq: Four times a day (QID) | ORAL | Status: DC | PRN
  Administered 2012-08-16: 650 mg via ORAL
  Filled 2012-08-16: qty 2

## 2012-08-16 MED ORDER — CLOPIDOGREL BISULFATE 75 MG PO TABS
75.0000 mg | ORAL_TABLET | Freq: Every day | ORAL | Status: DC
  Administered 2012-08-16 – 2012-08-17 (×2): 75 mg via ORAL
  Filled 2012-08-16 (×3): qty 1

## 2012-08-16 MED ORDER — ASPIRIN EC 81 MG PO TBEC
81.0000 mg | DELAYED_RELEASE_TABLET | Freq: Every day | ORAL | Status: DC
  Administered 2012-08-16 – 2012-08-17 (×2): 81 mg via ORAL
  Filled 2012-08-16 (×2): qty 1

## 2012-08-16 MED ORDER — ATORVASTATIN CALCIUM 40 MG PO TABS
40.0000 mg | ORAL_TABLET | Freq: Every day | ORAL | Status: DC
  Administered 2012-08-16 – 2012-08-17 (×2): 40 mg via ORAL
  Filled 2012-08-16 (×2): qty 1

## 2012-08-16 MED ORDER — LORAZEPAM 1 MG PO TABS
1.0000 mg | ORAL_TABLET | Freq: Four times a day (QID) | ORAL | Status: DC | PRN
  Administered 2012-08-16: 1 mg via ORAL
  Filled 2012-08-16: qty 1

## 2012-08-16 MED ORDER — LEVOTHYROXINE SODIUM 112 MCG PO TABS
112.0000 ug | ORAL_TABLET | Freq: Every day | ORAL | Status: DC
  Administered 2012-08-16 – 2012-08-17 (×2): 112 ug via ORAL
  Filled 2012-08-16 (×4): qty 1

## 2012-08-16 MED ORDER — VITAMIN B-1 100 MG PO TABS
100.0000 mg | ORAL_TABLET | Freq: Every day | ORAL | Status: DC
  Administered 2012-08-16: 100 mg via ORAL
  Filled 2012-08-16: qty 1

## 2012-08-16 MED ORDER — ONDANSETRON HCL 4 MG/2ML IJ SOLN
4.0000 mg | Freq: Once | INTRAMUSCULAR | Status: AC
  Administered 2012-08-16: 4 mg via INTRAVENOUS
  Filled 2012-08-16: qty 2

## 2012-08-16 MED ORDER — FUROSEMIDE 20 MG PO TABS
20.0000 mg | ORAL_TABLET | Freq: Every day | ORAL | Status: DC
  Administered 2012-08-16 – 2012-08-17 (×2): 20 mg via ORAL
  Filled 2012-08-16 (×2): qty 1

## 2012-08-16 MED ORDER — SENNOSIDES-DOCUSATE SODIUM 8.6-50 MG PO TABS: 1.0000 | ORAL_TABLET | Freq: Every evening | ORAL | Status: DC | PRN

## 2012-08-16 MED ORDER — THIAMINE HCL 100 MG/ML IJ SOLN
100.0000 mg | Freq: Every day | INTRAMUSCULAR | Status: DC
  Filled 2012-08-16 (×2): qty 1

## 2012-08-16 MED ORDER — FOLIC ACID 1 MG PO TABS
1.0000 mg | ORAL_TABLET | Freq: Every day | ORAL | Status: DC
  Administered 2012-08-17: 1 mg via ORAL
  Filled 2012-08-16 (×2): qty 1

## 2012-08-16 MED ORDER — NITROGLYCERIN 0.4 MG SL SUBL: 0.4000 mg | SUBLINGUAL_TABLET | SUBLINGUAL | Status: DC | PRN

## 2012-08-16 MED ORDER — NICOTINE 14 MG/24HR TD PT24
14.0000 mg | MEDICATED_PATCH | Freq: Every day | TRANSDERMAL | Status: DC
Start: 2012-08-16 — End: 2012-08-17
  Administered 2012-08-16 – 2012-08-17 (×2): 14 mg via TRANSDERMAL
  Filled 2012-08-16 (×2): qty 1

## 2012-08-16 MED ORDER — TEMAZEPAM 7.5 MG PO CAPS
7.5000 mg | ORAL_CAPSULE | Freq: Every evening | ORAL | Status: DC | PRN
  Administered 2012-08-16: 15 mg via ORAL
  Filled 2012-08-16: qty 2

## 2012-08-16 MED ORDER — CARVEDILOL 6.25 MG PO TABS
6.2500 mg | ORAL_TABLET | Freq: Two times a day (BID) | ORAL | Status: DC
  Administered 2012-08-16 – 2012-08-17 (×4): 6.25 mg via ORAL
  Filled 2012-08-16 (×6): qty 1

## 2012-08-16 MED ORDER — LORAZEPAM 2 MG/ML IJ SOLN: 1.0000 mg | Freq: Four times a day (QID) | INTRAMUSCULAR | Status: DC | PRN

## 2012-08-16 MED ORDER — LORAZEPAM 1 MG PO TABS
0.0000 mg | ORAL_TABLET | Freq: Four times a day (QID) | ORAL | Status: DC
  Administered 2012-08-17: 1 mg via ORAL
  Filled 2012-08-16: qty 1

## 2012-08-16 MED ORDER — LORAZEPAM 1 MG PO TABS: 0.0000 mg | ORAL_TABLET | Freq: Two times a day (BID) | ORAL | Status: DC

## 2012-08-16 MED ORDER — VITAMIN B-1 100 MG PO TABS
100.0000 mg | ORAL_TABLET | Freq: Every day | ORAL | Status: DC
  Administered 2012-08-17: 100 mg via ORAL
  Filled 2012-08-16 (×2): qty 1

## 2012-08-16 MED ORDER — PANTOPRAZOLE SODIUM 40 MG PO TBEC
40.0000 mg | DELAYED_RELEASE_TABLET | Freq: Every day | ORAL | Status: DC
  Administered 2012-08-16 – 2012-08-17 (×2): 40 mg via ORAL
  Filled 2012-08-16 (×2): qty 1

## 2012-08-16 MED ORDER — BENAZEPRIL HCL 20 MG PO TABS
20.0000 mg | ORAL_TABLET | Freq: Every day | ORAL | Status: DC
  Administered 2012-08-16 – 2012-08-17 (×2): 20 mg via ORAL
  Filled 2012-08-16 (×2): qty 1

## 2012-08-16 MED ORDER — POTASSIUM CHLORIDE 10 MEQ/100ML IV SOLN
10.0000 meq | INTRAVENOUS | Status: AC
  Administered 2012-08-16 (×3): 10 meq via INTRAVENOUS
  Filled 2012-08-16 (×3): qty 100

## 2012-08-16 MED ORDER — ALLOPURINOL 100 MG PO TABS
100.0000 mg | ORAL_TABLET | Freq: Every day | ORAL | Status: DC
  Administered 2012-08-16 – 2012-08-17 (×2): 100 mg via ORAL
  Filled 2012-08-16 (×2): qty 1

## 2012-08-16 NOTE — Progress Notes (Signed)
Stroke Team Progress Note  HISTORY Don [name] is a 67 y.o. male with a history of nonischemic cardiomyopathy, TIAs, alcohol abuse who presents 08/15/2012 with transient right arm and face numbness in the setting of hypertension. The patient reports that his right arm and face were numb, and he needed his blood pressure found to be in the 180s. This lasted for upwards of an hour, and is completely resolved at this point  For the past 3 weeks he has been under a lot of stress, and therefore started drinking and smoking again. He drinks excessively, yesterday he had one half of a fifth of liquor as well as a bottle of wine. During this time frame, he found himself to be getting lightheaded intermittently as well as having unsteady gait. Today he has not had that problem, and is also not been drinking.  Patient was not a TPA candidate secondary to sx resolution. He was admitted for further evaluation and treatment.  SUBJECTIVE No family is at the bedside.  Overall he feels his condition is stable. He reports a hx of TIAs in the past.  OBJECTIVE Most recent Vital Signs: Filed Vitals:   08/16/12 0345 08/16/12 0433 08/16/12 0804 08/16/12 1001  BP:  153/92 137/95 144/83  Pulse: 62 71 66 65  Temp:  97.9 F (36.6 C) 98.6 F (37 C) 97.5 F (36.4 C)  TempSrc:  Oral Oral Oral  Resp:  18 20 18   Height:  6\' 1"  (1.854 m)    Weight:  98.657 kg (217 lb 8 oz)    SpO2: 98% 97% 99% 100%   CBG (last 3)  No results found for this basename: GLUCAP,  in the last 72 hours  IV Fluid Intake:     MEDICATIONS  . allopurinol  100 mg Oral Daily  . aspirin EC  81 mg Oral Daily  . atorvastatin  40 mg Oral Daily  . benazepril  20 mg Oral Daily  . carvedilol  6.25 mg Oral BID WC  . clopidogrel  75 mg Oral Q breakfast  . enoxaparin (LOVENOX) injection  40 mg Subcutaneous Daily  . furosemide  20 mg Oral Daily  . levothyroxine  112 mcg Oral QAC breakfast  . pantoprazole  40 mg Oral Daily  . thiamine  100  mg Oral Daily   PRN:  nitroGLYCERIN, senna-docusate  Diet:  Cardiac thin liquids Activity:   DVT Prophylaxis:  Lovenox 40 mg sq daily   CLINICALLY SIGNIFICANT STUDIES Basic Metabolic Panel:  Recent Labs Lab 08/15/12 2312 08/16/12 0337  NA 134*  --   K 3.3*  --   CL 97  --   CO2 28  --   GLUCOSE 109*  --   BUN 8  --   CREATININE 0.72  --   CALCIUM 8.4  --   MG  --  1.8   Liver Function Tests:  Recent Labs Lab 08/15/12 2312  AST 44*  ALT 25  ALKPHOS 79  BILITOT 0.8  PROT 6.7  ALBUMIN 3.5   CBC:  Recent Labs Lab 08/15/12 2312  WBC 9.1  HGB 14.5  HCT 41.3  MCV 86.0  PLT 186   Coagulation: No results found for this basename: LABPROT, INR,  in the last 168 hours Cardiac Enzymes:  Recent Labs Lab 08/15/12 2153 08/15/12 2312  TROPONINI <0.30 <0.30   Urinalysis: No results found for this basename: COLORURINE, APPERANCEUR, LABSPEC, PHURINE, GLUCOSEU, HGBUR, BILIRUBINUR, KETONESUR, PROTEINUR, UROBILINOGEN, NITRITE, LEUKOCYTESUR,  in the last 168  hours Lipid Panel    Component Value Date/Time   CHOL 139 08/16/2012 0343   TRIG 45 08/16/2012 0343   HDL 105 08/16/2012 0343   CHOLHDL 1.3 08/16/2012 0343   VLDL 9 08/16/2012 0343   LDLCALC 25 08/16/2012 0343   HgbA1C  Lab Results  Component Value Date   HGBA1C 5.0 08/16/2012    Urine Drug Screen:   No results found for this basename: labopia, cocainscrnur, labbenz, amphetmu, thcu, labbarb    Alcohol Level: No results found for this basename: ETH,  in the last 168 hours  CT of the brain  08/16/2012  Stable sequelae of chronic small vessel ischemic changes.  No acute intracranial process.   MRI/A of the brain  pacer  2D Echocardiogram    Carotid Doppler  Bilateral: Less than 39% ICA stenosis. Vertebral artery flow is antegrade.   CXR  08/16/2012   No acute cardiopulmonary disease.    EKG  Ventricular paced rhythm   Therapy Recommendations   Physical Exam  General: The patient is alert and cooperative at  the time of the examination.  Skin: No significant peripheral edema is noted.   Neurologic Exam  Cranial nerves: Facial symmetry is present. Speech is normal, no aphasia or dysarthria is noted. Extraocular movements are full. Visual fields are full.  Motor: The patient has good strength in all 4 extremities.  Coordination: The patient has good finger-nose-finger and heel-to-shin bilaterally.  Gait and station: The gait was not tested. No drift is seen.  Reflexes: Deep tendon reflexes are symmetric.       ASSESSMENT Don [name] is a 67 y.o. male presenting with right arm and face numbness. Left brain stroke suspected.  Imaging pending. Etiology unknown at this point.  On aspirin 81 mg orally every day prior to admission. Now on aspirin 81 mg orally every day and clopidogrel 75 mg orally every day for secondary stroke prevention.  Work up underway.   Non ischemic cardiomyopathy, EF 20% w/ bi-v defib  Hx TIAs  etoh abuse  Accelerated Hypertension, BP 180s at home, 168/97 here  Cigarette smoker  Hospital day # 1  TREATMENT/PLAN  Continue aspirin 81 mg orally every day and clopidogrel 75 mg orally every day for secondary stroke prevention for now. Stroke guidelines not clear on treatment, other than anticoagulants are not recommended. Needs to follow with Dr. Pearlean Brownie for his recommendations (he will not return to hospital rounds until Wed).  F/u 2D, Carotids  Repeat Ct to confirm/refute stroke  Annie Main, MSN, RN, ANVP-BC, ANP-BC, GNP-BC [facility] Pager: [phone] 08/16/2012 11:43 AM  I have personally obtained a history, examined the patient, evaluated imaging results, and formulated the assessment and plan of care. I agree with the above. Lesly Dukes

## 2012-08-16 NOTE — H&P (Signed)
Hospitalist Admission History and Physical  Patient name: Don [name] Medical record number: [id number] Date of birth: Jul 22, 1945 Age: 67 y.o. Gender: male  Primary Care Provider: [name], MD  Chief Complaint: TIAs History of Present Illness:This is a 67 y.o. year old male with multiple medical problems including hx/o TIAs, nonischemic/alcoholic cardiomyopathy s/p pacemaker, alcohol abuse presenting with TIAs x 3 days. Pt states that he has had intermittent episodes of dizziness, R sided arm and facial weakness. Pt states that he had his BP checked by his neighbor who is a nurse today and his BP was in the 180s/130s. Pt states his BP has never been this high.  Pt states that he took back drinking and smoking 2-3 weeks ago after being abstinent for 5 months. Pt states that he has been under stress because he found out that he was going to lose his house. Pt denies any CP or SOB assd with sxs. No recent infections. No falls or trauma. No fevers or chills. No nausea. Has had some mild intermittent diarrhea. Does take baby aspirin daily. Has had TIAs in the past. Feels that TIAs in the past have been more severe.  In the ER. BP was in normotensive range. A head CT showed chronic small vessel changes without acute infarct. MRI contraindicated given pacemaker/ICD. Neuro consulted in the ER. Recommended admission and stroke/TIA work up.   Patient Active Problem List   Diagnosis Date Noted  . TIA (transient ischemic attack) 08/16/2012  . Hemorrhoids with complication 04/29/2012  . Acute renal failure 04/26/2012  . Hypovolemic shock 04/26/2012  . Rectal bleeding 04/26/2012  . Diarrhea 04/26/2012  . Hypokalemia 04/26/2012  . Right rib fracture 04/26/2012  . Fall 04/26/2012  . Dehydration 04/26/2012  . Alcohol abuse   . Stroke   . Thyroid disease   . ICD-[employer] 07/10/2010  . Chronic systolic heart failure 07/10/2010  . Ventricular tachycardia 07/10/2010   Past Medical  History: Past Medical History  Diagnosis Date  . Nonischemic cardiomyopathy     EF 20% s/p INSERTION OF BI-V DEFIBRILLATOR.  . Chronic systolic heart failure     LEFT  . LBBB (left bundle branch block)   . Alcohol abuse     PREVIOUS H/O DRINKING A FIFTH OF VODKA  A DAY  . History of angina   . Stroke     TIA's  . Thyroid disease     HYPOTHYROIDISM.....TREATED  . ICD (implantable cardiac defibrillator) in place   . CHF (congestive heart failure)   . Anginal pain   . Chronic kidney disease     acute renal  04/2012  . Arthritis     hands & back  . Coronary artery disease     Past Surgical History: Past Surgical History  Procedure Laterality Date  . Insert / replace / remove pacemaker      BI-V IMPLANTABLE CARDIOVERTER-DEFIBRILLATOR. ; [employer] CONCERTO; MODEL  # [id number], SERIAL # [id number].  DR. Humberto Leep EDMUNDS.  Marland Kitchen Cardiac catheterization    . Tonsillectomy    . Cystoscopy w/ stone manipulation    . Vasectomy    . Flexible sigmoidoscopy N/A 04/29/2012    Procedure: FLEXIBLE SIGMOIDOSCOPY;  Surgeon: Vertell Novak., MD;  Location: [facility] ENDOSCOPY;  Service: Endoscopy;  Laterality: N/A;    Social History: History   Social History  . Marital Status: Widowed    Spouse Name: N/A    Number of Children: N/A  . Years of Education: N/A   Occupational History  .  RETIRED     SOCIAL WORKER   Social History Main Topics  . Smoking status: Former Smoker -- 43 years    Quit date: 02/27/2012  . Smokeless tobacco: Never Used  . Alcohol Use: No     Comment: H/O ALCOHOLISM PERVIOUSLY A FIFTH OF VODKA A DAY  . Drug Use: No  . Sexually Active: None   Other Topics Concern  . None   Social History Narrative   BI-V IMPLANTABLE CARDIOVERTER-DEFIBRILLATOR; [employer] CONCERTO, MODEL # [id number]; SERIAL # [id number];       RETIRED SOCIAL WORKER      MARRIED 4 TIMES      H/O ALCOHOLISM; A FIFTH OF VODKA DAILY IN THE PAST             Family History: Family History  Problem  Relation Age of Onset  . Heart disease Mother   . Heart disease Father   . Cancer    . Hypertension      Allergies: Allergies  Allergen Reactions  . Sulfa Antibiotics     unknown    Current Facility-Administered Medications  Medication Dose Route Frequency Provider Last Rate Last Dose  . allopurinol (ZYLOPRIM) tablet 100 mg  100 mg Oral Daily Doree Albee, MD      . aspirin tablet 81 mg  81 mg Oral Daily Doree Albee, MD      . atorvastatin (LIPITOR) tablet 40 mg  40 mg Oral Daily Doree Albee, MD      . benazepril (LOTENSIN) tablet 20 mg  20 mg Oral Daily Doree Albee, MD      . carvedilol (COREG) tablet 6.25 mg  6.25 mg Oral BID WC Doree Albee, MD      . clopidogrel (PLAVIX) tablet 75 mg  75 mg Oral Q breakfast Doree Albee, MD      . enoxaparin (LOVENOX) injection 40 mg  40 mg Subcutaneous Q24H Doree Albee, MD      . furosemide (LASIX) tablet 20 mg  20 mg Oral Daily Doree Albee, MD      . levothyroxine (SYNTHROID, LEVOTHROID) tablet 112 mcg  112 mcg Oral Daily Doree Albee, MD      . nitroGLYCERIN (NITROSTAT) SL tablet 0.4 mg  0.4 mg Sublingual Q5 min PRN Doree Albee, MD      . senna-docusate (Senokot-S) tablet 1 tablet  1 tablet Oral QHS PRN Doree Albee, MD      . thiamine (VITAMIN B-1) tablet 100 mg  100 mg Oral Daily Doree Albee, MD       Current Outpatient Prescriptions  Medication Sig Dispense Refill  . allopurinol (ZYLOPRIM) 100 MG tablet Take 100 mg by mouth daily.       Marland Kitchen aspirin 81 MG tablet Take 81 mg by mouth daily.        Marland Kitchen atorvastatin (LIPITOR) 40 MG tablet Take 40 mg by mouth daily.       . benazepril (LOTENSIN) 20 MG tablet Take 20 mg by mouth daily.      . carvedilol (COREG) 6.25 MG tablet Take 6.25 mg by mouth 2 (two) times daily with a meal.      . furosemide (LASIX) 20 MG tablet Take 20 mg by mouth daily.      Marland Kitchen levothyroxine (SYNTHROID, LEVOTHROID) 112 MCG tablet Take 112 mcg by mouth daily.      . nitroGLYCERIN (NITROSTAT) 0.4 MG SL  tablet Place 0.4 mg under the tongue every 5 (five) minutes as needed for chest pain.       Marland Kitchen  thiamine 100 MG tablet Take 1 tablet (100 mg total) by mouth daily.  30 tablet  0   Review Of Systems: 12 point ROS negative except as noted above in HPI.  Physical Exam: Filed Vitals:   08/16/12 0038  BP:   Pulse: 70  Temp:   Resp:     General: alert and cooperative HEENT: PERRLA and extra ocular movement intact Heart: S1, S2 normal, no murmur, rub or gallop, regular rate and rhythm Lungs: clear to auscultation Abdomen: abdomen is soft without significant tenderness, masses, organomegaly or guarding Extremities: extremities normal, atraumatic, no cyanosis or edema Skin:no rashes, no ecchymoses Neurology: CN II-XII grossly intact, mildly decreased cerebellar function in RUE, no asterixis, otherwise normal exam  Labs and Imaging: Lab Results  Component Value Date/Time   NA 134* 08/15/2012 11:12 PM   K 3.3* 08/15/2012 11:12 PM   CL 97 08/15/2012 11:12 PM   CO2 28 08/15/2012 11:12 PM   BUN 8 08/15/2012 11:12 PM   CREATININE 0.72 08/15/2012 11:12 PM   GLUCOSE 109* 08/15/2012 11:12 PM   Lab Results  Component Value Date   WBC 9.1 08/15/2012   HGB 14.5 08/15/2012   HCT 41.3 08/15/2012   MCV 86.0 08/15/2012   PLT 186 08/15/2012    Ct Head Wo Contrast  08/16/2012   *RADIOLOGY REPORT*  Clinical Data: Dizziness, weakness, and numbness  CT HEAD WITHOUT CONTRAST  Technique:  Contiguous axial images were obtained from the base of the skull through the vertex without contrast.  Comparison: None.  Findings: There is no acute intracranial hemorrhage or infarct.  No midline shift or mass lesion.  Subtle hypoattenuation within the periventricular white matter is consistent with chronic small vessel ischemic changes.  This is unchanged.  No extra-axial fluid collection.  Calvarium is intact.  Visualized sinuses and mastoid air cells are clear.  IMPRESSION: Stable sequelae of chronic small vessel ischemic  changes.  No acute intracranial process.   Original Report Authenticated By: Rise Mu, M.D.     Assessment and Plan: JOSECARLOS HARRIOTT is a 67 y.o. year old male presenting with TIA   TIA: will proceed with TIA work up including risk stratification labs, caroitd dopplers, 2d ECHO. Start pt on plavix. Neuro recs.   Nonischemic Cardiomyopathy: Clinically asymptomatic currently. Continue home meds. Cycle CEs. Continue baby ASA.   ETOH abuse: check ammonia level.  CIWA protocol.   Hypothyroidism: check TSH. Continue synthroid.   Hypokalemia: mild. Likely from intermittent diarrhea in setting of ETOH abuse. Replete and recheck. Mag level.   FEN/GI: heart healthy diet pending swallow eval.  Prophylaxis: lovenox. PPI  Disposition: pending further evaluation  Code Status:full code.        Doree Albee MD  Pager: [phone]

## 2012-08-16 NOTE — Progress Notes (Signed)
Nutrition Brief Note  Patient identified on the Malnutrition Screening Tool (MST) Report for weight loss. Per discussion with patient, he has lost > 45 lb intentionally by going to the gym. His intake has been good.  Body mass index is 28.7 kg/(m^2). Patient meets criteria for overweight based on current BMI.   Current diet order is Heart Healthy, patient is consuming 75-100% of meals at this time. Labs and medications reviewed.   No nutrition interventions warranted at this time. If nutrition issues arise, please consult RD.   Joaquin Courts, RD, LDN, CNSC Pager [phone] After Hours Pager [phone]

## 2012-08-16 NOTE — Progress Notes (Signed)
  Echocardiogram 2D Echocardiogram has been performed.  Don [name] 08/16/2012, 9:26 AM

## 2012-08-16 NOTE — Progress Notes (Signed)
VASCULAR LAB PRELIMINARY  PRELIMINARY  PRELIMINARY  PRELIMINARY  Carotid duplex completed.    Preliminary report:  Bilateral:  Less than 39% ICA stenosis.  Vertebral artery flow is antegrade.     Saw Mendenhall, RVS 08/16/2012, 8:50 AM

## 2012-08-16 NOTE — Consult Note (Signed)
Neurology Consultation Reason for Consult: Possible TIA Referring Physician: Dierdre Highman, B  CC: Possible TIA  History is obtained from: Patient  HPI: Don [name] is a 67 y.o. male with a history of nonischemic cardiomyopathy, TIAs, alcohol abuse who presents with transient right arm and face numbness in the setting of  hypertension. The patient reports that his right arm and face were numb, and he needed his blood pressure found to be in the 180s. This lasted for upwards of an hour, and is completely resolved at this point  For the past 3 weeks he has been under a lot of stress, and therefore started drinking and smoking again. He drinks excessively, yesterday he had one half of a fifth of liquor as well as a bottle of wine.  During this time frame, he found himself to be getting lightheaded intermittently as well as having unsteady gait. Today he has not had that problem, and is also not been drinking.  ROS: A 14 point ROS was performed and is negative except as noted in the HPI.  Past Medical History  Diagnosis Date  . Nonischemic cardiomyopathy     EF 20% s/p INSERTION OF BI-V DEFIBRILLATOR.  . Chronic systolic heart failure     LEFT  . LBBB (left bundle branch block)   . Alcohol abuse     PREVIOUS H/O DRINKING A FIFTH OF VODKA  A DAY  . History of angina   . Stroke     TIA's  . Thyroid disease     HYPOTHYROIDISM.....TREATED  . ICD (implantable cardiac defibrillator) in place   . CHF (congestive heart failure)   . Anginal pain   . Chronic kidney disease     acute renal  04/2012  . Arthritis     hands & back  . Coronary artery disease     Family History: Heart disease, hypertension  Social History: Tob: Recently restarted smoking Recently restarted heavy drinking  Exam: Current vital signs: BP 141/90  Pulse 70  Temp(Src) 98.1 F (36.7 C) (Oral)  Resp 16  SpO2 99% Vital signs in last 24 hours: Temp:  [98.1 F (36.7 C)] 98.1 F (36.7 C) (06/30 0030) Pulse  Rate:  [70-76] 70 (06/30 0038) Resp:  [16] 16 (06/29 2150) BP: (141-168)/(90-97) 141/90 mmHg (06/30 0031) SpO2:  [97 %-99 %] 99 % (06/30 0038)  General: In bed, NAD CV: Regular rate and rhythm Mental Status: Patient is awake, alert, oriented to person, place, month, year, and situation. Immediate and remote memory are intact. Patient is able to give a clear and coherent history. No signs of aphasia or neglect Cranial Nerves: II: Visual Fields are full. Pupils are equal, round, and reactive to light.  Discs are difficult to visualize. III,IV, VI: EOMI without ptosis or diploplia.  V: Facial sensation is symmetric to temperature VII: Facial movement is symmetric.  VIII: hearing is intact to voice X: Uvula elevates symmetrically XI: Shoulder shrug is symmetric. XII: tongue is midline without atrophy or fasciculations.  Motor: Tone is normal. Bulk is normal. 5/5 strength was present in all four extremities.  Sensory: Sensation is symmetric to light touch and pin in the arms and legs. Deep Tendon Reflexes: 2+ and symmetric in the biceps and patellae.  Cerebellar: FNF  intact bilaterally Gait: Stable casual gait, able to tandem without difficulty    I have reviewed labs in epic and the results pertinent to this consultation are: Mildly hyponatremic and hypokalemic  I have reviewed the images obtained: CT  head-no acute findings  Impression: 67 year old male with transient right-sided arm and facial numbness. I suspect that this does represent TIA. He cannot have an MRI due to his ICD. He may benefit from changing from aspirin to another antiplatelet therapy such as Plavix 75 mg.  Recommendations: 1. HgbA1c, fasting lipid panel 2. Frequent neuro checks 3. Echocardiogram 4. Carotid dopplers 5. Prophylactic therapy-Antiplatelet med: Aspirin - dose 81mg  7. Risk factor modification 8. Telemetry monitoring    Ritta Slot, MD [facility] [phone]  If  7pm- 7am, please page neurology on call at [phone].

## 2012-08-16 NOTE — Progress Notes (Addendum)
[facility] PROGRESS NOTE  JEREMAINE MARAJ NWG:[id number] DOB: 04-24-1945 DOA: 08/15/2012 PCP: [name], MD I have seen and examined pt who is a 67 yo with multiple medical problems including hx/o TIAs, nonischemic/alcoholic cardiomyopathy s/p pacemaker, alcohol abus admitted by Dr Alvester Morin this am  with transient right arm and face numbness. he admits to relapse of his alcohol abuse. he does have a defibrillator and CT scan of his head is negative for acute findings and followup CT for today is pending. 2-D echo shows EF of 45-50% (improved from 04/2012 echo with EF of 40-45%), no embolic source. Carotid Doppler as 39% stenosis. He denies any new complaints at this time, is alert and oriented x3,will continue current management plan as per Dr. Alvester Morin and follow up on pending studies.      Kela Millin  [facility] Pager [phone]. If 7PM-7AM, please contact night-coverage at [email or web address], password [facility] 08/16/2012, 8:32 AM  LOS: 1 day

## 2012-08-17 DIAGNOSIS — Z95810 Presence of automatic (implantable) cardiac defibrillator: Secondary | ICD-10-CM

## 2012-08-17 DIAGNOSIS — I5022 Chronic systolic (congestive) heart failure: Secondary | ICD-10-CM

## 2012-08-17 DIAGNOSIS — F1010 Alcohol abuse, uncomplicated: Secondary | ICD-10-CM

## 2012-08-17 LAB — BASIC METABOLIC PANEL
CO2: 31 mEq/L (ref 19–32)
Calcium: 8.1 mg/dL — ABNORMAL LOW (ref 8.4–10.5)
Chloride: 102 mEq/L (ref 96–112)
Glucose, Bld: 120 mg/dL — ABNORMAL HIGH (ref 70–99)
Potassium: 3.1 mEq/L — ABNORMAL LOW (ref 3.5–5.1)
Sodium: 139 mEq/L (ref 135–145)

## 2012-08-17 MED ORDER — POTASSIUM CHLORIDE CRYS ER 20 MEQ PO TBCR: 40.0000 meq | EXTENDED_RELEASE_TABLET | ORAL | Status: DC

## 2012-08-17 MED ORDER — CLOPIDOGREL BISULFATE 75 MG PO TABS: 75.0000 mg | ORAL_TABLET | Freq: Every day | ORAL | Status: DC

## 2012-08-17 MED ORDER — POTASSIUM CHLORIDE CRYS ER 20 MEQ PO TBCR
40.0000 meq | EXTENDED_RELEASE_TABLET | ORAL | Status: DC
  Administered 2012-08-17 (×2): 40 meq via ORAL
  Filled 2012-08-17 (×3): qty 2

## 2012-08-17 NOTE — Progress Notes (Signed)
Stroke Team Progress Note  HISTORY Don [name] is a 67 y.o. male with a history of nonischemic cardiomyopathy, TIAs, alcohol abuse who presents 08/15/2012 with transient right arm and face numbness in the setting of hypertension. The patient reports that his right arm and face were numb, and he needed his blood pressure found to be in the 180s. This lasted for upwards of an hour, and is completely resolved at this point  For the past 3 weeks he has been under a lot of stress, and therefore started drinking and smoking again. He drinks excessively, yesterday he had one half of a fifth of liquor as well as a bottle of wine. During this time frame, he found himself to be getting lightheaded intermittently as well as having unsteady gait. Today he has not had that problem, and is also not been drinking.  Patient was not a TPA candidate secondary to sx resolution. He was admitted for further evaluation and treatment.  SUBJECTIVE Therapists are at his bedside.  OBJECTIVE Most recent Vital Signs: Filed Vitals:   08/16/12 1800 08/16/12 2110 08/17/12 0153 08/17/12 0619  BP: 133/89 136/75 138/86 123/80  Pulse: 62 62 68 60  Temp:  97.7 F (36.5 C) 97.7 F (36.5 C) 97.8 F (36.6 C)  TempSrc:  Oral Oral Oral  Resp:  18 18 18   Height:      Weight:      SpO2:  99% 97% 97%   CBG (last 3)  No results found for this basename: GLUCAP,  in the last 72 hours  IV Fluid Intake:     MEDICATIONS  . allopurinol  100 mg Oral Daily  . aspirin EC  81 mg Oral Daily  . atorvastatin  40 mg Oral Daily  . benazepril  20 mg Oral Daily  . carvedilol  6.25 mg Oral BID WC  . clopidogrel  75 mg Oral Q breakfast  . enoxaparin (LOVENOX) injection  40 mg Subcutaneous Daily  . folic acid  1 mg Oral Daily  . furosemide  20 mg Oral Daily  . levothyroxine  112 mcg Oral QAC breakfast  . LORazepam  0-4 mg Oral Q6H   Followed by  . [START ON 08/18/2012] LORazepam  0-4 mg Oral Q12H  . multivitamin with minerals  1  tablet Oral Daily  . nicotine  14 mg Transdermal Daily  . pantoprazole  40 mg Oral Daily  . thiamine  100 mg Oral Daily   Or  . thiamine  100 mg Intravenous Daily   PRN:  acetaminophen, LORazepam, LORazepam, nitroGLYCERIN, senna-docusate, temazepam  Diet:  Cardiac thin liquids Activity:   DVT Prophylaxis:  Lovenox 40 mg sq daily   CLINICALLY SIGNIFICANT STUDIES Basic Metabolic Panel:   Recent Labs Lab 08/15/12 2312 08/16/12 0337 08/17/12 0508  NA 134*  --  139  K 3.3*  --  3.1*  CL 97  --  102  CO2 28  --  31  GLUCOSE 109*  --  120*  BUN 8  --  6  CREATININE 0.72  --  0.74  CALCIUM 8.4  --  8.1*  MG  --  1.8  --    Liver Function Tests:   Recent Labs Lab 08/15/12 2312  AST 44*  ALT 25  ALKPHOS 79  BILITOT 0.8  PROT 6.7  ALBUMIN 3.5   CBC:   Recent Labs Lab 08/15/12 2312  WBC 9.1  HGB 14.5  HCT 41.3  MCV 86.0  PLT 186  Coagulation: No results found for this basename: LABPROT, INR,  in the last 168 hours Cardiac Enzymes:   Recent Labs Lab 08/15/12 2153 08/15/12 2312  TROPONINI <0.30 <0.30   Urinalysis: No results found for this basename: COLORURINE, APPERANCEUR, LABSPEC, PHURINE, GLUCOSEU, HGBUR, BILIRUBINUR, KETONESUR, PROTEINUR, UROBILINOGEN, NITRITE, LEUKOCYTESUR,  in the last 168 hours Lipid Panel    Component Value Date/Time   CHOL 139 08/16/2012 0343   TRIG 45 08/16/2012 0343   HDL 105 08/16/2012 0343   CHOLHDL 1.3 08/16/2012 0343   VLDL 9 08/16/2012 0343   LDLCALC 25 08/16/2012 0343   HgbA1C  Lab Results  Component Value Date   HGBA1C 5.0 08/16/2012    Urine Drug Screen:   No results found for this basename: labopia,  cocainscrnur,  labbenz,  amphetmu,  thcu,  labbarb    Alcohol Level: No results found for this basename: ETH,  in the last 168 hours  CT of the brain   08/16/2012 Stable exam without evidence of acute intracranial infarct. 08/16/2012  Stable sequelae of chronic small vessel ischemic changes.  No acute intracranial  process.   MRI/A of the brain  pacer  2D Echocardiogram  EF 45-50% with no source of embolus.   Carotid Doppler  Bilateral: Less than 39% ICA stenosis. Vertebral artery flow is antegrade.   CXR  08/16/2012   No acute cardiopulmonary disease.    EKG  Ventricular paced rhythm   Therapy Recommendations   Physical Exam  General: The patient is alert and cooperative at the time of the examination.  Skin: No significant peripheral edema is noted.   Neurologic Exam  Cranial nerves: Facial symmetry is present. Speech is normal, no aphasia or dysarthria is noted. Extraocular movements are full. Visual fields are full.  Motor: The patient has good strength in all 4 extremities.  Coordination: The patient has good finger-nose-finger and heel-to-shin bilaterally.  Gait and station: The gait was not tested. No drift is seen.  Reflexes: Deep tendon reflexes are symmetric.       ASSESSMENT Don [name] is a 67 y.o. male presenting with right arm and face numbness. Left brain stroke, not seen on repeat CT. Most likely subcortical, event felt to be due to small vessel disease   On aspirin 81 mg orally every day prior to admission. Now on aspirin 81 mg orally every day and clopidogrel 75 mg orally every day for secondary stroke prevention.  Work up completed.   Non ischemic cardiomyopathy, EF 20% w/ bi-v defib. Up to 45% this admission.  Hx TIAs  etoh abuse  Accelerated Hypertension, BP 180s at home, 168/97 here  Cigarette smoker  Hospital day # 2  TREATMENT/PLAN  Continue aspirin 81 mg orally every day and clopidogrel 75 mg orally every day for secondary stroke prevention x 3 months then one alone.  Given increased EF, no indication for anticoagulants No further stroke workup indicated. Patient has a 10-15% risk of having another stroke over the next year, the highest risk is within 2 weeks of the most recent stroke/TIA (risk of having a stroke following a stroke or  TIA is the same). Ongoing risk factor control by Primary Care Physician Stroke Service will sign off. Please call should any needs arise. Follow up with Dr. Pearlean Brownie, Stroke Clinic, in 2 months.  Annie Main, MSN, RN, ANVP-BC, ANP-BC, [facility] Pager: [phone] 08/17/2012 10:05 AM  I have personally obtained a history, examined the patient, evaluated imaging results, and formulated  the assessment and plan of care. I agree with the above.   Lesly Dukes

## 2012-08-17 NOTE — Progress Notes (Signed)
Occupational Therapy Evaluation - and Discharge Patient Details Name: Don [name] MRN: [id number] DOB: 10-Sep-1945 Today's Date: 08/17/2012 Time: 4098-1191 OT Time Calculation (min): 28 min  OT Assessment / Plan / Recommendation History of present illness Pt is a 67 y.o. year old male with multiple medical problems including hx/o TIAs, nonischemic/alcoholic cardiomyopathy s/p pacemaker, alcohol abuse presenting with TIAs x 3 days. Pt states that he has had intermittent episodes of dizziness, R sided arm and facial weakness. BP was in the 180s/130s. The highest its ever been.MD suspects Left brain stroke, not seen on repeat CT. Most likely subcortical, event felt to be due to small vessel disease.    Clinical Impression   Orthostatic BP taken ,and all 3 were within normal limits. Pt is functioning at independent level again. No balance deficits or problems with ADL. Pt is back to baseline and is motivated to stay away from alcohol and smoking, which he sees as the cause of this. Pt is also going to the gym 5x a week and has lost >45lbs, and is motivated to continue weight loss. Pt does not need further OT services.    OT Assessment  Patient does not need any further OT services    Follow Up Recommendations  No OT follow up    Barriers to Discharge  none    Equipment Recommendations  None recommended by OT          Precautions / Restrictions Precautions Precautions: Fall Restrictions Weight Bearing Restrictions: No   Pertinent Vitals/Pain Pt reported no pain during session.    ADL  Eating/Feeding: Independent Where Assessed - Eating/Feeding: Chair Grooming: Independent Where Assessed - Grooming: Unsupported standing Upper Body Bathing: Independent Where Assessed - Upper Body Bathing: Supported sit to stand Lower Body Bathing: Independent Where Assessed - Lower Body Bathing: Unsupported sitting Upper Body Dressing: Independent Where Assessed - Upper Body Dressing:  Unsupported sitting Lower Body Dressing: Independent Where Assessed - Lower Body Dressing: Unsupported sitting Toilet Transfer: Independent Toilet Transfer Method: Sit to stand Toilet Transfer Equipment: Comfort height toilet Toileting - Clothing Manipulation and Hygiene: Independent Where Assessed - Engineer, mining and Hygiene: Standing Tub/Shower Transfer: Independent;Other (comment) (walk in shower) Tub/Shower Transfer Method: Science writer: Walk in shower Equipment Used: Gait belt Transfers/Ambulation Related to ADLs: Pt independent with no balance deficits.  ADL Comments: Pt independent in sink level ADL. Pt was fully dressed when OTS entered room and has been using the bathroom on his own. Pt able to don socks with no problems.     OT Goals(Current goals can be found in the care plan section) Acute Rehab OT Goals Patient Stated Goal: To go home and see grandchildren  Visit Information  Last OT Received On: 08/17/12 Assistance Needed: +1 PT/OT Co-Evaluation/Treatment: Yes History of Present Illness: Pt is a 67 y.o. year old male with multiple medical problems including hx/o TIAs, nonischemic/alcoholic cardiomyopathy s/p pacemaker, alcohol abuse presenting with TIAs x 3 days. Pt states that he has had intermittent episodes of dizziness, R sided arm and facial weakness. BP was in the 180s/130s. The highest its ever been.MD suspects Left brain stroke, not seen on repeat CT. Most likely subcortical, event felt to be due to small vessel disease.        Prior Functioning     Home Living Family/patient expects to be discharged to:: Private residence Living Arrangements: Alone Available Help at Discharge: Family;Friend(s) Type of Home: Other(Comment) (townhouse) Home Access: Stairs to enter [employer] of Steps:  8 Entrance Stairs-Rails: Right;Left Home Layout: Multi-level;1/2 bath on main level Alternate Level Stairs-Number of  Steps: 12-15 Alternate Level Stairs-Rails: Left Home Equipment: None Prior Function Level of Independence: Independent Communication Communication: No difficulties Dominant Hand: Right            Cognition  Cognition Arousal/Alertness: Awake/alert Behavior During Therapy: WFL for tasks assessed/performed Overall Cognitive Status: Within Functional Limits for tasks assessed       Mobility Bed Mobility Bed Mobility: Supine to Sit;Sitting - Scoot to Edge of Bed Supine to Sit: 7: Independent Sitting - Scoot to Edge of Bed: 7: Independent Details for Bed Mobility Assistance: Pt indpendent in bed mobility Transfers Transfers: Sit to Stand;Stand to Sit Sit to Stand: 7: Independent;Without upper extremity assist;From bed Stand to Sit: 7: Independent;With upper extremity assist;To chair/3-in-1 Details for Transfer Assistance: Pt independent and safe with transfers        Balance Balance Balance Assessed: Yes Standardized Balance Assessment Standardized Balance Assessment: Dynamic Gait Index Dynamic Gait Index Level Surface: Normal Change in Gait Speed: Normal Gait with Horizontal Head Turns: Normal Gait with Vertical Head Turns: Normal Gait and Pivot Turn: Normal Step Over Obstacle: Normal Step Around Obstacles: Normal Steps: Normal Total Score: 24   End of Session OT - End of Session Equipment Utilized During Treatment: Gait belt Activity Tolerance: Patient tolerated treatment well Patient left: in chair;with call bell/phone within reach;with family/visitor present Nurse Communication: Mobility status  GO     Sherryl Manges 08/17/2012, 10:47 AM

## 2012-08-17 NOTE — Progress Notes (Signed)
Pt d/c home accompanied with daughter AVS and medication script given  Stroke ducation reinforced with a teach  Back pt verbalized good understanding smoke cessation and alcohol  Education also given with information . Vital isgns and Condition at d/c were stable.Marland Kitchen

## 2012-08-17 NOTE — Discharge Summary (Signed)
Physician Discharge Summary  Don [name] Don [name]:[id number] DOB: 1945/11/16 DOA: 08/15/2012  PCP: [name], MD  Admit date: 08/15/2012 Discharge date: 08/17/2012  Time spent: <30 mins  Recommendations for Outpatient Follow-up:      Follow-up Information   Follow up with Gates Rigg, MD. Schedule an appointment as soon as possible for a visit in 2 months. (stroke clinic)    Contact information:   [address] [place] [address] [phone]       Follow up with [name], MD. (in 1-2weeks, call for appt upon discharge)    Contact information:   [address] [place] [address] [phone]        Discharge Diagnoses:  Active Problems:   TIA (transient ischemic attack) Alcohol abuse Hypokalemia Nonischemic Cardiomyopathy  Discharge Condition: improved/stable  Diet recommendation: heart healthy  Filed Weights   08/16/12 0433  Weight: 98.657 kg (217 lb 8 oz)    History of present illness:  History of Present Illness:This is a 67 y.o. year old male with multiple medical problems including hx/o TIAs, nonischemic/alcoholic cardiomyopathy s/p pacemaker, alcohol abuse presenting with TIAs x 3 days. Pt states that he has had intermittent episodes of dizziness, R sided arm and facial weakness. Pt states that he had his BP checked by his neighbor who is a nurse today and his BP was in the 180s/130s. Pt states his BP has never been this high.  Pt states that he took back drinking and smoking 2-3 weeks ago after being abstinent for 5 months. Pt states that he has been under stress because he found out that he was going to lose his house. Pt denies any CP or SOB assd with sxs. No recent infections. No falls or trauma. No fevers or chills. No nausea. Has had some mild intermittent diarrhea. Does take baby aspirin daily. Has had TIAs in the past. Feels that TIAs in the past have been more severe.  In the ER. BP was in  normotensive range. A head CT showed chronic small vessel changes without acute infarct. MRI contraindicated given pacemaker/ICD. Neuro consulted in the ER. Recommended admission and stroke/TIA work up.    Hospital Course:  TIA: Upon admission the patient was placed on Plavix along with aspirin .workup included a CT scan of head which did not show any acute infarct. patient has an AICD and so an MRI R. was not done but a followup CT scan was done which came back negative. A 2-D echocardiogram showed an EF of 45-50% (slightly improved from his prior echo). Right adductor was done and showed less than 39% stenosis. The patient's symptoms are resolved. He was seen by our neurology, and they recommend for him to stay on aspirin and Plavix for 3 months and then discontinue the aspirin and continue Plavix long-term. Patient is to followup with Dr. Pearlean Brownie upon discharge and also with his PCP. Nonischemic Cardiomyopathy: Clinically asymptomatic currently. Continue home meds. Troponins were cycled and came back negative. Continue baby ASA, Plavix added as above.  ETOH abuse: Patient was placed on Ativan detox protocol. His ammonia level was within normal limits. His had no signs of withdrawal on followup today. He is to follow up outpatient with his PCP. Hypothyroidism: he is to continue his Synthroid upon discharge Hypokalemia: mild. His potassium was replaced in the hospital.    Procedures: ECHO Study Conclusions  - Left ventricle: The cavity size was normal. Systolic function was mildly reduced. The estimated ejection fraction was in  the range of 45% to 50%. Possible akinesis of the basalinferior myocardium. Doppler parameters are consistent with abnormal left ventricular relaxation (grade 1 diastolic dysfunction). - Left atrium: The atrium was moderately dilated. - Right ventricle: The cavity size was mildly dilated. Wall thickness was normal. Impressions:  - No cardiac source of emboli was  indentified.  Carotid doppler Preliminary report: Bilateral: Less than 39% ICA stenosis. Vertebral artery flow is antegrade.   Consultations:  NEURO  Discharge Exam: Filed Vitals:   08/17/12 0153 08/17/12 0619 08/17/12 1050 08/17/12 1342  BP: 138/86 123/80 132/88 142/82  Pulse: 68 60 73 72  Temp: 97.7 F (36.5 C) 97.8 F (36.6 C) 97.9 F (36.6 C) 98.7 F (37.1 C)  TempSrc: Oral Oral Oral Oral  Resp: 18 18 16 18   Height:      Weight:      SpO2: 97% 97% 99% 99%    Exam:  General: alert & oriented x3 In NAD Cardiovascular: RRR, nl S1 s2 Respiratory: CTAB Abdomen: soft +BS NT/ND, no masses palpable Extremities: No cyanosis and no edema   Discharge Instructions  Discharge Orders   Future Orders Complete By Expires     Diet - low sodium heart healthy  As directed     Increase activity slowly  As directed         Medication List         allopurinol 100 MG tablet  Commonly known as:  ZYLOPRIM  Take 100 mg by mouth daily.     aspirin 81 MG tablet  Take 81 mg by mouth daily.     atorvastatin 40 MG tablet  Commonly known as:  LIPITOR  Take 40 mg by mouth daily.     benazepril 20 MG tablet  Commonly known as:  LOTENSIN  Take 20 mg by mouth daily.     carvedilol 6.25 MG tablet  Commonly known as:  COREG  Take 6.25 mg by mouth 2 (two) times daily with a meal.     clopidogrel 75 MG tablet  Commonly known as:  PLAVIX  Take 1 tablet (75 mg total) by mouth daily with breakfast.     furosemide 20 MG tablet  Commonly known as:  LASIX  Take 20 mg by mouth daily.     levothyroxine 112 MCG tablet  Commonly known as:  SYNTHROID, LEVOTHROID  Take 112 mcg by mouth daily.     nitroGLYCERIN 0.4 MG SL tablet  Commonly known as:  NITROSTAT  Place 0.4 mg under the tongue every 5 (five) minutes as needed for chest pain.     potassium chloride SA 20 MEQ tablet  Commonly known as:  K-DUR,KLOR-CON  Take 2 tablets (40 mEq total) by mouth every 4 (four) hours.      thiamine 100 MG tablet  Take 1 tablet (100 mg total) by mouth daily.       Allergies  Allergen Reactions  . Sulfa Antibiotics     unknown       Follow-up Information   Follow up with Gates Rigg, MD. Schedule an appointment as soon as possible for a visit in 2 months. (stroke clinic)    Contact information:   [address] [place] [address] [phone]       Follow up with [name], MD. (in 1-2weeks, call for appt upon discharge)    Contact information:   [address] [place] [address] [phone]        The results of significant  diagnostics from this hospitalization (including imaging, microbiology, ancillary and laboratory) are listed below for reference.    Significant Diagnostic Studies: Dg Chest 2 View  08/16/2012   *RADIOLOGY REPORT*  Clinical Data: Stroke.  Left sided facial paralysis.  CHEST - 2 VIEW  Comparison: 04/26/2012.  Findings: Unchanged left subclavian AICD.  Cardiopericardial silhouette appears within normal limits.  Trachea midline.  No airspace disease.  No pleural effusion.  Eventration of the right hemidiaphragm.  IMPRESSION: No acute cardiopulmonary disease.   Original Report Authenticated By: Andreas Newport, M.D.   Ct Head Wo Contrast  08/16/2012   *RADIOLOGY REPORT*  Clinical Data: Dizziness, weakness, transition numbness  CT HEAD WITHOUT CONTRAST  Technique:  Contiguous axial images were obtained from the base of the skull through the vertex without contrast.  Comparison: None.  Findings: There is no acute intracranial hemorrhage or infarct.  No midline shift or mass lesion. Scattered and confluent hypodensity within the periventricular white matter is consistent with chronic small vessel ischemic changes. No extra-axial fluid collection. Paranasal sinuses and mastoid air cells are clear.  IMPRESSION:   Stable exam without evidence of acute intracranial infarct.   Original Report Authenticated By:  Rise Mu, M.D.   Ct Head Wo Contrast  08/16/2012   *RADIOLOGY REPORT*  Clinical Data: Dizziness, weakness, and numbness  CT HEAD WITHOUT CONTRAST  Technique:  Contiguous axial images were obtained from the base of the skull through the vertex without contrast.  Comparison: None.  Findings: There is no acute intracranial hemorrhage or infarct.  No midline shift or mass lesion.  Subtle hypoattenuation within the periventricular white matter is consistent with chronic small vessel ischemic changes.  This is unchanged.  No extra-axial fluid collection.  Calvarium is intact.  Visualized sinuses and mastoid air cells are clear.  IMPRESSION: Stable sequelae of chronic small vessel ischemic changes.  No acute intracranial process.   Original Report Authenticated By: Rise Mu, M.D.    Microbiology: No results found for this or any previous visit (from the past 240 hour(s)).   Labs: Basic Metabolic Panel:  Recent Labs Lab 08/15/12 2312 08/16/12 0337 08/17/12 0508  NA 134*  --  139  K 3.3*  --  3.1*  CL 97  --  102  CO2 28  --  31  GLUCOSE 109*  --  120*  BUN 8  --  6  CREATININE 0.72  --  0.74  CALCIUM 8.4  --  8.1*  MG  --  1.8  --    Liver Function Tests:  Recent Labs Lab 08/15/12 2312  AST 44*  ALT 25  ALKPHOS 79  BILITOT 0.8  PROT 6.7  ALBUMIN 3.5   No results found for this basename: LIPASE, AMYLASE,  in the last 168 hours  Recent Labs Lab 08/16/12 0337  AMMONIA 31   CBC:  Recent Labs Lab 08/15/12 2312  WBC 9.1  HGB 14.5  HCT 41.3  MCV 86.0  PLT 186   Cardiac Enzymes:  Recent Labs Lab 08/15/12 2153 08/15/12 2312  TROPONINI <0.30 <0.30   BNP: BNP (last 3 results) No results found for this basename: PROBNP,  in the last 8760 hours CBG: No results found for this basename: GLUCAP,  in the last 168 hours     Signed:  Jacklynn Dehaas C  [facility] 08/17/2012, 4:52 PM

## 2012-08-17 NOTE — Evaluation (Signed)
Physical Therapy Evaluation Patient Details Name: Don [name] MRN: [id number] DOB: [birth year] Today's Date: 08/17/2012 Time: 4098-1191 PT Time Calculation (min): 21 min  PT Assessment / Plan / Recommendation History of Present Illness  Pt is a 67 y.o. year old male with multiple medical problems including hx/o TIAs, nonischemic/alcoholic cardiomyopathy s/p pacemaker, alcohol abuse presenting with TIAs x 3 days. Pt states that he has had intermittent episodes of dizziness, R sided arm and facial weakness. BP was in the 180s/130s. The highest its ever been.MD suspects Left brain stroke, not seen on repeat CT. Most likely subcortical, event felt to be due to small vessel disease.   Clinical Impression  Presents to PT today back to baseline. Performing higher challenging balance activities without difficulty.     PT Assessment  Patent does not need any further PT services    Follow Up Recommendations  No PT follow up    Does the patient have the potential to tolerate intense rehabilitation      Barriers to Discharge        Equipment Recommendations  None recommended by PT    Recommendations for Other Services     Frequency      Precautions / Restrictions Precautions Precautions: None Restrictions Weight Bearing Restrictions: No   Pertinent Vitals/Pain Denies pain      Mobility  Bed Mobility Bed Mobility: Supine to Sit;Sitting - Scoot to Edge of Bed Supine to Sit: 7: Independent Sitting - Scoot to Edge of Bed: 7: Independent Details for Bed Mobility Assistance: Pt indpendent in bed mobility Transfers Sit to Stand: 7: Independent;Without upper extremity assist;From bed Stand to Sit: 7: Independent;With upper extremity assist;To chair/3-in-1 Details for Transfer Assistance: Pt independent and safe with transfers Ambulation/Gait Ambulation/Gait Assistance: 7: Independent Ambulation Distance (Feet): 500 Feet Assistive device: None Gait Pattern: Within Functional  Limits Gait velocity: WFL Stairs: Yes Stairs Assistance: 6: Modified independent (Device/Increase time) Stair Management Technique: Forwards;One rail Right;Alternating pattern Number of Stairs: 12 Modified Rankin (Stroke Patients Only) Pre-Morbid Rankin Score: No symptoms Modified Rankin: No symptoms        PT Goals(Current goals can be found in the care plan section) Acute Rehab PT Goals Patient Stated Goal: To go home and see grandchildren PT Goal Formulation: No goals set, d/c therapy  Visit Information  Last PT Received On: 08/17/12 Assistance Needed: +1 History of Present Illness: Pt is a 67 y.o. year old male with multiple medical problems including hx/o TIAs, nonischemic/alcoholic cardiomyopathy s/p pacemaker, alcohol abuse presenting with TIAs x 3 days. Pt states that he has had intermittent episodes of dizziness, R sided arm and facial weakness. BP was in the 180s/130s. The highest its ever been.MD suspects Left brain stroke, not seen on repeat CT. Most likely subcortical, event felt to be due to small vessel disease.        Prior Functioning  Home Living Family/patient expects to be discharged to:: Private residence Living Arrangements: Alone Available Help at Discharge: Family;Friend(s) Type of Home: Other(Comment) (townhouse) Home Access: Stairs to enter [employer] of Steps: 8 Entrance Stairs-Rails: Right;Left Home Layout: Multi-level;1/2 bath on main level Alternate Level Stairs-Number of Steps: 12-15 Alternate Level Stairs-Rails: Left Home Equipment: None Prior Function Level of Independence: Independent Communication Communication: No difficulties Dominant Hand: Right    Cognition  Cognition Arousal/Alertness: Awake/alert Behavior During Therapy: WFL for tasks assessed/performed Overall Cognitive Status: Within Functional Limits for tasks assessed    Extremity/Trunk Assessment     Balance Balance Balance Assessed: Yes Static  Standing  Balance Static Standing - Balance Support: No upper extremity supported Static Standing - Level of Assistance: 7: Independent Static Standing - Comment/# of Minutes: no difficulties changing or maintaining body position with any challenges Tandem Stance - Right Leg: 45 Rhomberg - Eyes Opened: 30 Rhomberg - Eyes Closed: 30 Standardized Balance Assessment Standardized Balance Assessment: Dynamic Gait Index Dynamic Gait Index Level Surface: Normal Change in Gait Speed: Normal Gait with Horizontal Head Turns: Normal Gait with Vertical Head Turns: Normal Gait and Pivot Turn: Normal Step Over Obstacle: Normal Step Around Obstacles: Normal Steps: Normal Total Score: 24  End of Session PT - End of Session Equipment Utilized During Treatment: Gait belt Activity Tolerance: Patient tolerated treatment well Patient left: in chair;with call bell/phone within reach Nurse Communication: Mobility status  GP Functional Assessment Tool Used: DGI Functional Limitation: Mobility: Walking and moving around Mobility: Walking and Moving Around Current Status ([id number]): 0 percent impaired, limited or restricted Mobility: Walking and Moving Around Goal Status [phone]): 0 percent impaired, limited or restricted Mobility: Walking and Moving Around Discharge Status [phone]): 0 percent impaired, limited or restricted   [facility] HELEN 08/17/2012, 11:53 AM

## 2012-08-17 NOTE — Progress Notes (Signed)
SLP Cancellation Note  Patient Details Name: Don [name] MRN: [id number] DOB: [birth year]   Cancelled treatment:       Reason Eval/Treat Not Completed: Other (comment). History screened, pt at baseline per MD and therapists. Will defer cognitive linguistic eval.   Harlon Ditty, MA CCC-SLP [phone]  Claudine Mouton 08/17/2012, 3:19 PM

## 2012-08-17 NOTE — Progress Notes (Signed)
I agree with the following treatment note after reviewing documentation.   Johnston, Oronde Hallenbeck Brynn   OTR/L Pager: [phone] Office: [phone] .   

## 2012-08-18 NOTE — Progress Notes (Signed)
OT note- Gcode late entry   08/18/12 1000  OT G-codes **NOT FOR INPATIENT CLASS**  Functional Assessment Tool Used clinicial judgement  Functional Limitation Self care  Self Care Current Status [phone]) CI  Self Care Goal Status (U0454) CI  Self Care Discharge Status [phone]) CI    Lucile Shutters   OTR/L Pager: [phone] Office: [phone] .

## 2012-09-09 ENCOUNTER — Other Ambulatory Visit: Payer: Self-pay | Admitting: Internal Medicine

## 2012-09-09 NOTE — Telephone Encounter (Signed)
PT has never been seen in this clinic.  This refill request needs to go to his primary care physician.

## 2012-09-09 NOTE — Telephone Encounter (Signed)
Patient request medication refill.

## 2012-12-03 ENCOUNTER — Encounter: Payer: Self-pay | Admitting: Internal Medicine

## 2012-12-03 ENCOUNTER — Telehealth: Payer: Self-pay | Admitting: Internal Medicine

## 2012-12-03 NOTE — Telephone Encounter (Signed)
12-03-12 sent past due letter, last ck 05-2012 remote, last taylor ck 11-2011  needs taylor/brooke/mt

## 2012-12-30 ENCOUNTER — Encounter: Payer: Medicare Other | Admitting: Internal Medicine

## 2013-01-17 ENCOUNTER — Encounter: Payer: Self-pay | Admitting: Internal Medicine

## 2013-01-17 ENCOUNTER — Ambulatory Visit (INDEPENDENT_AMBULATORY_CARE_PROVIDER_SITE_OTHER): Payer: Medicare Other | Admitting: Internal Medicine

## 2013-01-17 VITALS — BP 167/117 | HR 57 | Ht 73.0 in | Wt 233.1 lb

## 2013-01-17 DIAGNOSIS — I5022 Chronic systolic (congestive) heart failure: Secondary | ICD-10-CM

## 2013-01-17 DIAGNOSIS — I472 Ventricular tachycardia: Secondary | ICD-10-CM

## 2013-01-17 DIAGNOSIS — Z95810 Presence of automatic (implantable) cardiac defibrillator: Secondary | ICD-10-CM

## 2013-01-17 LAB — MDC_IDC_ENUM_SESS_TYPE_INCLINIC
Brady Statistic AP VP Percent: 25.09 %
Brady Statistic AS VS Percent: 3.21 %
Brady Statistic RV Percent Paced: 96.76 %
HighPow Impedance: 171 Ohm
HighPow Impedance: 46 Ohm
Lead Channel Impedance Value: 228 Ohm
Lead Channel Pacing Threshold Amplitude: 0.75 V
Lead Channel Pacing Threshold Amplitude: 0.75 V
Lead Channel Sensing Intrinsic Amplitude: 2.75 mV
Lead Channel Setting Pacing Amplitude: 2 V
Zone Setting Detection Interval: 300 ms
Zone Setting Detection Interval: 350 ms

## 2013-01-17 NOTE — Assessment & Plan Note (Signed)
His symptoms are class II. I discussed the importance of avoidance of alcohol, taking medications, exercise, and a low-sodium diet. He has not done any of these things. He states that he will try to do better.

## 2013-01-17 NOTE — Assessment & Plan Note (Signed)
His [employer] biventricular ICD is working normally. We'll plan to recheck in several months. 

## 2013-01-17 NOTE — Progress Notes (Signed)
HPI Mr. Manard returns today for followup. He is a very pleasant 67 year old man with a history of nonischemic cardiomyopathy, chronic systolic heart failure, left bundle branch block, status post biventricular ICD implantation. The patient has a history of alcohol abuse, and admits to return to drinking, as he has been depressed. He denies syncope. He denies ICD shock, chest pain, or worsening shortness of breath. His heart failure symptoms are class II. Several months ago, he was exercising regularly, but has not done so recently. Allergies  Allergen Reactions  . Sulfa Antibiotics     unknown     Current Outpatient Prescriptions  Medication Sig Dispense Refill  . allopurinol (ZYLOPRIM) 100 MG tablet Take 100 mg by mouth daily.       Marland Kitchen atorvastatin (LIPITOR) 40 MG tablet Take 40 mg by mouth daily.       . benazepril (LOTENSIN) 20 MG tablet Take 20 mg by mouth daily.      . carvedilol (COREG) 6.25 MG tablet Take 6.25 mg by mouth 2 (two) times daily with a meal.      . clopidogrel (PLAVIX) 75 MG tablet Take 1 tablet (75 mg total) by mouth daily with breakfast.  30 tablet  0  . furosemide (LASIX) 20 MG tablet Take 20 mg by mouth daily.      Marland Kitchen levothyroxine (SYNTHROID, LEVOTHROID) 112 MCG tablet Take 112 mcg by mouth daily.      . nitroGLYCERIN (NITROSTAT) 0.4 MG SL tablet Place 0.4 mg under the tongue every 5 (five) minutes as needed for chest pain.        No current facility-administered medications for this visit.     Past Medical History  Diagnosis Date  . Nonischemic cardiomyopathy     EF 20% s/p INSERTION OF BI-V DEFIBRILLATOR.  . Chronic systolic heart failure     LEFT  . LBBB (left bundle branch block)   . Alcohol abuse     PREVIOUS H/O DRINKING A FIFTH OF VODKA  A DAY  . History of angina   . Stroke     TIA's  . Thyroid disease     HYPOTHYROIDISM.....TREATED  . ICD (implantable cardiac defibrillator) in place   . CHF (congestive heart failure)   . Anginal  pain   . Chronic kidney disease     acute renal  04/2012  . Arthritis     hands & back  . Coronary artery disease     ROS:   All systems reviewed and negative except as noted in the HPI.   Past Surgical History  Procedure Laterality Date  . Insert / replace / remove pacemaker      BI-V IMPLANTABLE CARDIOVERTER-DEFIBRILLATOR. ; [employer]CERT[employer]; MODEL  # [id number], SERIAL # [id number] H.  DR. Humberto Leep EDMUNDS.  Marland Kitchen Cardiac catheterization    . Tonsillectomy    . Cystoscopy w/ stone manipulation    . Vasectomy    . Flexible sigmoidoscopy N/A 04/29/2012    Procedure: FLEXIBLE SIGMOIDOSCOPY;  Surgeon: Vertell Novak., MD;  Location: [facility] ENDOSCOPY;  Service: Endoscopy;  Laterality: N/A;     Family History  Problem Relation Age of Onset  . Heart disease Mother   . Heart disease Father   . Cancer    . Hypertension       History   Social History  . Marital Status: Widowed    Spouse Name: N/A    Number of Children: N/A  . Years of Education: N/A  Occupational History  . RETIRED     SOCIAL WORKER   Social History Main Topics  . Smoking status: Former Smoker -- 43 years    Quit date: 02/27/2012  . Smokeless tobacco: Never Used  . Alcohol Use: No     Comment: H/O ALCOHOLISM PERVIOUSLY A FIFTH OF VODKA A DAY  . Drug Use: No  . Sexual Activity: Not on file   Other Topics Concern  . Not on file   Social History Narrative   BI-V IMPLANTABLE CARDIOVERTER-DEFIBRILLATOR; [employer]CERT[employer], MODEL # [id number]; SERIAL # [id number] H;       RETIRED SOCIAL WORKER      MARRIED 4 TIMES      H/O ALCOHOLISM; A FIFTH OF VODKA DAILY IN THE PAST              BP 167/117  Pulse 57  Ht 6\' 1"  (1.854 m)  Wt 233 lb 1.9 oz (105.743 kg)  BMI 30.76 kg/m2  Physical Exam:  Well appearing 67 year old man, NAD HEENT: Unremarkable Neck:  No JVD, no thyromegally Lymphatics:  No adenopathy Back:  No CVA tenderness Lungs:  Clear with no wheezes, rales, or rhonchi. HEART:  Regular  rate rhythm, no murmurs, no rubs, no clicks Abd:  soft, positive bowel sounds, no organomegally, no rebound, no guarding Ext:  2 plus pulses, no edema, no cyanosis, no clubbing Skin:  No rashes no nodules Neuro:  CN II through XII intact, motor grossly intact   DEVICE  Normal device function.  See PaceArt for details.   Assess/Plan:

## 2013-01-17 NOTE — Patient Instructions (Addendum)
Your physician recommends that you continue on your current medications as directed. Please refer to the Current Medication list given to you today.  Remote monitoring is used to monitor your Pacemaker of ICD from home. This monitoring reduces the number of office visits required to check your device to one time per year. It allows us to keep an eye on the functioning of your device to ensure it is working properly. You are scheduled for a device check from home on 04/19/2013. You may send your transmission at any time that day. If you have a wireless device, the transmission will be sent automatically. After your physician reviews your transmission, you will receive a postcard with your next transmission date.  Your physician wants you to follow-up in: one year with Dr. Taylor.  You will receive a reminder letter in the mail two months in advance. If you don't receive a letter, please call our office to schedule the follow-up appointment.    

## 2013-04-19 ENCOUNTER — Encounter: Payer: Medicare Other | Admitting: *Deleted

## 2013-05-06 ENCOUNTER — Encounter: Payer: Self-pay | Admitting: *Deleted

## 2013-06-13 ENCOUNTER — Encounter (HOSPITAL_COMMUNITY): Payer: Self-pay | Admitting: Emergency Medicine

## 2013-06-13 ENCOUNTER — Emergency Department (HOSPITAL_COMMUNITY)
Admission: EM | Admit: 2013-06-13 | Discharge: 2013-06-13 | Disposition: A | Discharge status: Home / Self Care | Payer: Medicare Other | Attending: Emergency Medicine | Admitting: Emergency Medicine

## 2013-06-13 ENCOUNTER — Emergency Department (HOSPITAL_COMMUNITY): Payer: Medicare Other

## 2013-06-13 DIAGNOSIS — R5383 Other fatigue: Secondary | ICD-10-CM

## 2013-06-13 DIAGNOSIS — H539 Unspecified visual disturbance: Secondary | ICD-10-CM | POA: Insufficient documentation

## 2013-06-13 DIAGNOSIS — R5381 Other malaise: Secondary | ICD-10-CM | POA: Insufficient documentation

## 2013-06-13 DIAGNOSIS — Z9889 Other specified postprocedural states: Secondary | ICD-10-CM | POA: Insufficient documentation

## 2013-06-13 DIAGNOSIS — E039 Hypothyroidism, unspecified: Secondary | ICD-10-CM | POA: Insufficient documentation

## 2013-06-13 DIAGNOSIS — I5022 Chronic systolic (congestive) heart failure: Secondary | ICD-10-CM | POA: Insufficient documentation

## 2013-06-13 DIAGNOSIS — Z8739 Personal history of other diseases of the musculoskeletal system and connective tissue: Secondary | ICD-10-CM | POA: Insufficient documentation

## 2013-06-13 DIAGNOSIS — N189 Chronic kidney disease, unspecified: Secondary | ICD-10-CM | POA: Insufficient documentation

## 2013-06-13 DIAGNOSIS — Z8673 Personal history of transient ischemic attack (TIA), and cerebral infarction without residual deficits: Secondary | ICD-10-CM | POA: Insufficient documentation

## 2013-06-13 DIAGNOSIS — Z95810 Presence of automatic (implantable) cardiac defibrillator: Secondary | ICD-10-CM | POA: Insufficient documentation

## 2013-06-13 DIAGNOSIS — R200 Anesthesia of skin: Secondary | ICD-10-CM

## 2013-06-13 DIAGNOSIS — Z87891 Personal history of nicotine dependence: Secondary | ICD-10-CM | POA: Insufficient documentation

## 2013-06-13 DIAGNOSIS — R197 Diarrhea, unspecified: Secondary | ICD-10-CM | POA: Insufficient documentation

## 2013-06-13 DIAGNOSIS — Z7902 Long term (current) use of antithrombotics/antiplatelets: Secondary | ICD-10-CM | POA: Insufficient documentation

## 2013-06-13 DIAGNOSIS — I2510 Atherosclerotic heart disease of native coronary artery without angina pectoris: Secondary | ICD-10-CM | POA: Insufficient documentation

## 2013-06-13 DIAGNOSIS — R531 Weakness: Secondary | ICD-10-CM

## 2013-06-13 DIAGNOSIS — R0602 Shortness of breath: Secondary | ICD-10-CM | POA: Insufficient documentation

## 2013-06-13 DIAGNOSIS — Z79899 Other long term (current) drug therapy: Secondary | ICD-10-CM | POA: Insufficient documentation

## 2013-06-13 DIAGNOSIS — R209 Unspecified disturbances of skin sensation: Secondary | ICD-10-CM | POA: Insufficient documentation

## 2013-06-13 DIAGNOSIS — M79609 Pain in unspecified limb: Secondary | ICD-10-CM | POA: Insufficient documentation

## 2013-06-13 DIAGNOSIS — R109 Unspecified abdominal pain: Secondary | ICD-10-CM | POA: Insufficient documentation

## 2013-06-13 DIAGNOSIS — E669 Obesity, unspecified: Secondary | ICD-10-CM | POA: Insufficient documentation

## 2013-06-13 DIAGNOSIS — R110 Nausea: Secondary | ICD-10-CM | POA: Insufficient documentation

## 2013-06-13 DIAGNOSIS — I209 Angina pectoris, unspecified: Secondary | ICD-10-CM | POA: Insufficient documentation

## 2013-06-13 LAB — DIFFERENTIAL
BASOS ABS: 0 10*3/uL (ref 0.0–0.1)
BASOS PCT: 1 % (ref 0–1)
EOS ABS: 0.1 10*3/uL (ref 0.0–0.7)
EOS PCT: 1 % (ref 0–5)
Lymphocytes Relative: 19 % (ref 12–46)
Lymphs Abs: 1.4 10*3/uL (ref 0.7–4.0)
Monocytes Absolute: 0.8 10*3/uL (ref 0.1–1.0)
Monocytes Relative: 11 % (ref 3–12)
NEUTROS PCT: 70 % (ref 43–77)
Neutro Abs: 5.2 10*3/uL (ref 1.7–7.7)

## 2013-06-13 LAB — CBC
HCT: 41 % (ref 39.0–52.0)
Hemoglobin: 13.7 g/dL (ref 13.0–17.0)
MCH: 31.1 pg (ref 26.0–34.0)
MCHC: 33.4 g/dL (ref 30.0–36.0)
MCV: 93.2 fL (ref 78.0–100.0)
PLATELETS: 162 10*3/uL (ref 150–400)
RBC: 4.4 MIL/uL (ref 4.22–5.81)
RDW: 13.1 % (ref 11.5–15.5)
WBC: 7.5 10*3/uL (ref 4.0–10.5)

## 2013-06-13 LAB — COMPREHENSIVE METABOLIC PANEL
ALBUMIN: 3.4 g/dL — AB (ref 3.5–5.2)
ALT: 17 U/L (ref 0–53)
AST: 48 U/L — AB (ref 0–37)
Alkaline Phosphatase: 74 U/L (ref 39–117)
BUN: 23 mg/dL (ref 6–23)
CALCIUM: 8.2 mg/dL — AB (ref 8.4–10.5)
CO2: 27 mEq/L (ref 19–32)
Chloride: 96 mEq/L (ref 96–112)
Creatinine, Ser: 0.95 mg/dL (ref 0.50–1.35)
GFR calc non Af Amer: 84 mL/min — ABNORMAL LOW (ref >90)
Glucose, Bld: 126 mg/dL — ABNORMAL HIGH (ref 70–99)
POTASSIUM: 4 meq/L (ref 3.7–5.3)
SODIUM: 138 meq/L (ref 137–147)
TOTAL PROTEIN: 6.6 g/dL (ref 6.0–8.3)
Total Bilirubin: 1.3 mg/dL — ABNORMAL HIGH (ref 0.3–1.2)

## 2013-06-13 LAB — APTT: aPTT: 28 seconds (ref 24–37)

## 2013-06-13 LAB — I-STAT TROPONIN, ED: Troponin i, poc: 0.01 ng/mL (ref 0.00–0.08)

## 2013-06-13 LAB — PROTIME-INR
INR: 1 (ref 0.00–1.49)
PROTHROMBIN TIME: 13 s (ref 11.6–15.2)

## 2013-06-13 NOTE — ED Notes (Signed)
PA at bedside.

## 2013-06-13 NOTE — ED Notes (Signed)
Pt. Reports around 1400 began having weakness and numbness to right arm and face. Reports tingling in toes "for a long while" and states "lately I've had a hard time walking". Pt. Alert and oriented x4. Stroke scale negative at this time. Also reports nausea with abdominal pain and diarrhea.

## 2013-06-13 NOTE — ED Notes (Addendum)
Pt reports at about 1400 felt like the right side of his face was numb and tingling in fingers and toes. Pt has equal grip strengths but reports he feels like his right arm is weak. Also, that he is having difficulty walking. Pt is a x 4. There is no facial droop and reports that currently he feels the numbness is better.

## 2013-06-13 NOTE — Discharge Instructions (Signed)
Weakness Weakness is a lack of strength. It may be felt all over the body (generalized) or in one specific part of the body (focal). Some causes of weakness can be serious. You may need further medical evaluation, especially if you are elderly or you have a history of immunosuppression (such as chemotherapy or HIV), kidney disease, heart disease, or diabetes. CAUSES  Weakness can be caused by many different things, including:  Infection.  Physical exhaustion.  Internal bleeding or other blood loss that results in a lack of red blood cells (anemia).  Dehydration. This cause is more common in elderly people.  Side effects or electrolyte abnormalities from medicines, such as pain medicines or sedatives.  Emotional distress, anxiety, or depression.  Circulation problems, especially severe peripheral arterial disease.  Heart disease, such as rapid atrial fibrillation, bradycardia, or heart failure.  Nervous system disorders, such as Guillain-Barr syndrome, multiple sclerosis, or stroke. DIAGNOSIS  To find the cause of your weakness, your caregiver will take your history and perform a physical exam. Lab tests or X-rays may also be ordered, if needed. TREATMENT  Treatment of weakness depends on the cause of your symptoms and can vary greatly. HOME CARE INSTRUCTIONS   Rest as needed.  Eat a well-balanced diet.  Try to get some exercise every day.  Only take over-the-counter or prescription medicines as directed by your caregiver. SEEK MEDICAL CARE IF:   Your weakness seems to be getting worse or spreads to other parts of your body.  You develop new aches or pains. SEEK IMMEDIATE MEDICAL CARE IF:   You cannot perform your normal daily activities, such as getting dressed and feeding yourself.  You cannot walk up and down stairs, or you feel exhausted when you do so.  You have shortness of breath or chest pain.  You have difficulty moving parts of your body.  You have weakness  in only one area of the body or on only one side of the body.  You have a fever.  You have trouble speaking or swallowing.  You cannot control your bladder or bowel movements.  You have black or bloody vomit or stools. MAKE SURE YOU:  Understand these instructions.  Will watch your condition.  Will get help right away if you are not doing well or get worse. Document Released: 02/03/2005 Document Revised: 08/05/2011 Document Reviewed: 04/04/2011 [facility] Patient Information 2014 [place], [place].  Neuropathic Pain We often think that pain has a physical cause. If we get rid of the cause, the pain should go away. Nerves themselves can also cause pain. It is called neuropathic pain, which means nerve abnormality. It may be difficult for the patients who have it and for the treating caregivers. Pain is usually described as acute (short-lived) or chronic (long-lasting). Acute pain is related to the physical sensations caused by an injury. It can last from a few seconds to many weeks, but it usually goes away when normal healing occurs. Chronic pain lasts beyond the typical healing time. With neuropathic pain, the nerve fibers themselves may be damaged or injured. They then send incorrect signals to other pain centers. The pain you feel is real, but the cause is not easy to find.  CAUSES  Chronic pain can result from diseases, such as diabetes and shingles (an infection related to chickenpox), or from trauma, surgery, or amputation. It can also happen without any known injury or disease. The nerves are sending pain messages, even though there is no identifiable cause for such messages.  Other common causes of neuropathy include diabetes, phantom limb pain, or Regional Pain Syndrome (RPS).  As with all forms of chronic back pain, if neuropathy is not correctly treated, there can be a number of associated problems that lead to a downward cycle for the patient. These include depression,  sleeplessness, feelings of fear and anxiety, limited social interaction and inability to do normal daily activities or work.  The most dramatic and mysterious example of neuropathic pain is called "phantom limb syndrome." This occurs when an arm or a leg has been removed because of illness or injury. The brain still gets pain messages from the nerves that originally carried impulses from the missing limb. These nerves now seem to misfire and cause troubling pain.  Neuropathic pain often seems to have no cause. It responds poorly to standard pain treatment. Neuropathic pain can occur after:  Shingles (herpes zoster virus infection).  A lasting burning sensation of the skin, caused usually by injury to a peripheral nerve.  Peripheral neuropathy which is widespread nerve damage, often caused by diabetes or alcoholism.  Phantom limb pain following an amputation.  Facial nerve problems (trigeminal neuralgia).  Multiple sclerosis.  Reflex sympathetic dystrophy.  Pain which comes with cancer and cancer chemotherapy.  Entrapment neuropathy such as when pressure is put on a nerve such as in carpal tunnel syndrome.  Back, leg, and hip problems (sciatica).  Spine or back surgery.  HIV Infection or AIDS where nerves are infected by viruses. Your caregiver can explain items in the above list which may apply to you. SYMPTOMS  Characteristics of neuropathic pain are:  Severe, sharp, electric shock-like, shooting, lightening-like, knife-like.  Pins and needles sensation.  Deep burning, deep cold, or deep ache.  Persistent numbness, tingling, or weakness.  Pain resulting from light touch or other stimulus that would not usually cause pain.  Increased sensitivity to something that would normally cause pain, such as a pinprick. Pain may persist for months or years following the healing of damaged tissues. When this happens, pain signals no longer sound an alarm about current injuries or  injuries about to happen. Instead, the alarm system itself is not working correctly.  Neuropathic pain may get worse instead of better over time. For some people, it can lead to serious disability. It is important to be aware that severe injury in a limb can occur without a proper, protective pain response.Burns, cuts, and other injuries may go unnoticed. Without proper treatment, these injuries can become infected or lead to further disability. Take any injury seriously, and consult your caregiver for treatment. DIAGNOSIS  When you have a pain with no known cause, your caregiver will probably ask some specific questions:   Do you have any other conditions, such as diabetes, shingles, multiple sclerosis, or HIV infection?  How would you describe your pain? (Neuropathic pain is often described as shooting, stabbing, burning, or searing.)  Is your pain worse at any time of the day? (Neuropathic pain is usually worse at night.)  Does the pain seem to follow a certain physical pathway?  Does the pain come from an area that has missing or injured nerves? (An example would be phantom limb pain.)  Is the pain triggered by minor things such as rubbing against the sheets at night? These questions often help define the type of pain involved. Once your caregiver knows what is happening, treatment can begin. Anticonvulsant, antidepressant drugs, and various pain relievers seem to work in some cases. If another condition, such as  diabetes is involved, better management of that disorder may relieve the neuropathic pain.  TREATMENT  Neuropathic pain is frequently long-lasting and tends not to respond to treatment with narcotic type pain medication. It may respond well to other drugs such as antiseizure and antidepressant medications. Usually, neuropathic problems do not completely go away, but partial improvement is often possible with proper treatment. Your caregivers have large numbers of medications available  to treat you. Do not be discouraged if you do not get immediate relief. Sometimes different medications or a combination of medications will be tried before you receive the results you are hoping for. See your caregiver if you have pain that seems to be coming from nowhere and does not go away. Help is available.  SEEK IMMEDIATE MEDICAL CARE IF:   There is a sudden change in the quality of your pain, especially if the change is on only one side of the body.  You notice changes of the skin, such as redness, black or purple discoloration, swelling, or an ulcer.  You cannot move the affected limbs. Document Released: 11/01/2003 Document Revised: 04/28/2011 Document Reviewed: 11/01/2003 [facility] Patient Information 2014 [place], [place].

## 2013-06-13 NOTE — ED Notes (Signed)
Pt. Transported to CT 

## 2013-06-13 NOTE — ED Provider Notes (Signed)
Patient developed numbness in his right face and right arm today gradual in onset 2 hours ago, with transient blurred vision. Symptoms have since improved with time, gradually, without treatment. Vision is now normal. He presently only complains of mild numbness around the right side of his mouth. No difficulty speaking no other associated symptoms. Patient also reports diarrhea for greater than a year he admits to chills yesterday he had slight shortness of breath today which resolved spontaneously after several minutes. Presently breathing normal. On exam patient is alert Glasgow Coma Score 15. No distress HEENT exam no facial asymmetry mucous membranes moist pink neck supple trachea midline lungs clear auscultation heart regular rate and rhythm abdomen obese nontender extremities without edema skin warm and dry neurologic Glasgow Coma Score 15 cranial nerves II through XII grossly intact moves all extremities well motor strength 5 over 5 overall gait normal Romberg normal prior drift normal DTRs symmetric bilaterally knee jerk ankle jerk biceps was ordered bilaterally.  ZOX:WRUEDM:Head CT scan ordered. Patient cannot have MRI as he has an AICD I've asked neurology to see the patient Results for orders placed during the hospital encounter of 06/13/13  PROTIME-INR      Result Value Ref Range   Prothrombin Time 13.0  11.6 - 15.2 seconds   INR 1.00  0.00 - 1.49  APTT      Result Value Ref Range   aPTT 28  24 - 37 seconds  CBC      Result Value Ref Range   WBC 7.5  4.0 - 10.5 K/uL   RBC 4.40  4.22 - 5.81 MIL/uL   Hemoglobin 13.7  13.0 - 17.0 g/dL   HCT 45.441.0  09.839.0 - 11.952.0 %   MCV 93.2  78.0 - 100.0 fL   MCH 31.1  26.0 - 34.0 pg   MCHC 33.4  30.0 - 36.0 g/dL   RDW 14.713.1  82.911.5 - 56.215.5 %   Platelets 162  150 - 400 K/uL  DIFFERENTIAL      Result Value Ref Range   Neutrophils Relative % 70  43 - 77 %   Neutro Abs 5.2  1.7 - 7.7 K/uL   Lymphocytes Relative 19  12 - 46 %   Lymphs Abs 1.4  0.7 - 4.0 K/uL   Monocytes Relative 11  3 - 12 %   Monocytes Absolute 0.8  0.1 - 1.0 K/uL   Eosinophils Relative 1  0 - 5 %   Eosinophils Absolute 0.1  0.0 - 0.7 K/uL   Basophils Relative 1  0 - 1 %   Basophils Absolute 0.0  0.0 - 0.1 K/uL  COMPREHENSIVE METABOLIC PANEL      Result Value Ref Range   Sodium 138  137 - 147 mEq/L   Potassium 4.0  3.7 - 5.3 mEq/L   Chloride 96  96 - 112 mEq/L   CO2 27  19 - 32 mEq/L   Glucose, Bld 126 (*) 70 - 99 mg/dL   BUN 23  6 - 23 mg/dL   Creatinine, Ser 1.300.95  0.50 - 1.35 mg/dL   Calcium 8.2 (*) 8.4 - 10.5 mg/dL   Total Protein 6.6  6.0 - 8.3 g/dL   Albumin 3.4 (*) 3.5 - 5.2 g/dL   AST 48 (*) 0 - 37 U/L   ALT 17  0 - 53 U/L   Alkaline Phosphatase 74  39 - 117 U/L   Total Bilirubin 1.3 (*) 0.3 - 1.2 mg/dL   GFR calc non  Af Amer 84 (*) >90 mL/min   GFR calc Af Amer >90  >90 mL/min  I-STAT TROPOININ, ED      Result Value Ref Range   Troponin i, poc 0.01  0.00 - 0.08 ng/mL   Comment 3            Dg Chest 2 View  06/13/2013   CLINICAL DATA:  Numbness in right-sided face and right arm. Dehydration. Nausea. History CHF. Prior stroke. COPD. Hypertension.  EXAM: CHEST  2 VIEW  COMPARISON:  08/16/2012  FINDINGS: Pacer/AICD device. Right atrium, right ventricle, and coronary sinus. No lead discontinuity.  Midline trachea. Borderline cardiomegaly. Mediastinal contours otherwise within normal limits. No pleural effusion or pneumothorax. No congestive failure. Clear lungs.  IMPRESSION: Borderline cardiomegaly, without acute disease.   Electronically Signed   By: Jeronimo Greaves M.D.   On: 06/13/2013 18:31   Dr. Thad Ranger evaluate patient and feels that he can be evaluated for TIA as outpatient with Dr. [facility]. No further treatment needed  Doug Sou, MD 06/13/13 2354

## 2013-06-13 NOTE — ED Provider Notes (Signed)
CSN: [id number]     Arrival date & time 06/13/13  1602 History   First MD Initiated Contact with Patient 06/13/13 1617     Chief Complaint  Patient presents with  . Numbness     (Consider location/radiation/quality/duration/timing/severity/associated sxs/prior Treatment) HPI Comments: Patient is a 68 year old male with history of nonischemic cardiomyopathy, chronic systolic heart failure, LBBB, previous alcohol abuse, stroke, thyroid disease, ICD, CHF, CKD who presents today for evaluation of transient numbness on the right side of his face with radiation into his right arm. It occurred around 2pm. His symptoms have gradually improved and he currently only complains of mild numbness near the right side of his mouth. He has associated blurred vision. He denies associated speech difficulty or ataxia. He complains of difficulty walking due to increased pain in his toes over the past few years. He has not had this worked up previously. He states that next time he sees his PCP he will bring a list of his problems so he does not forget to mention them. He additionally complains of abdominal pain, nausea, and diarrhea. He was seen by his PCP on Monday for this and they told him he had a viral gastroenteritis. He feels as though his symptoms are improving related to that particular illness.   The history is provided by the patient. No language interpreter was used.    Past Medical History  Diagnosis Date  . Nonischemic cardiomyopathy     EF 20% s/p INSERTION OF BI-V DEFIBRILLATOR.  . Chronic systolic heart failure     LEFT  . LBBB (left bundle branch block)   . Alcohol abuse     PREVIOUS H/O DRINKING A FIFTH OF VODKA  A DAY  . History of angina   . Stroke     TIA's  . Thyroid disease     HYPOTHYROIDISM.....TREATED  . ICD (implantable cardiac defibrillator) in place   . CHF (congestive heart failure)   . Anginal pain   . Chronic kidney disease     acute renal  04/2012  . Arthritis     hands  & back  . Coronary artery disease    Past Surgical History  Procedure Laterality Date  . Insert / replace / remove pacemaker      BI-V IMPLANTABLE CARDIOVERTER-DEFIBRILLATOR. ; [employer]CERTO; MODEL  # [id number], SERIAL # [id number] H.  DR. Humberto Leep EDMUNDS.  Marland Kitchen Cardiac catheterization    . Tonsillectomy    . Cystoscopy w/ stone manipulation    . Vasectomy    . Flexible sigmoidoscopy N/A 04/29/2012    Procedure: FLEXIBLE SIGMOIDOSCOPY;  Surgeon: Vertell Novak., MD;  Location[facility] ENDOSCOPY;  Service: Endoscopy;  Laterality: N/A;   Family History  Problem Relation Age of Onset  . Heart disease Mother   . Heart disease Father   . Cancer    . Hypertension     History  Substance Use Topics  . Smoking status: Former Smoker -- 43 years    Quit date: 02/27/2012  . Smokeless tobacco: Never Used  . Alcohol Use: No     Comment: H/O ALCOHOLISM PERVIOUSLY A FIFTH OF VODKA A DAY    Review of Systems  Constitutional: Positive for chills.  Eyes: Negative for visual disturbance.  Respiratory: Positive for shortness of breath.   Cardiovascular: Negative for chest pain.  Gastrointestinal: Positive for nausea, abdominal pain and diarrhea. Negative for vomiting.  Musculoskeletal: Positive for arthralgias and myalgias.  Neurological: Positive for numbness. Negative for speech  difficulty.  All other systems reviewed and are negative.     Allergies  Sulfa antibiotics  Home Medications   Prior to Admission medications   Medication Sig Start Date End Date Taking? Authorizing Provider  allopurinol (ZYLOPRIM) 100 MG tablet Take 100 mg by mouth daily.     Historical Provider, MD  atorvastatin (LIPITOR) 40 MG tablet Take 40 mg by mouth daily.  09/03/11   Historical Provider, MD  benazepril (LOTENSIN) 20 MG tablet Take 20 mg by mouth daily.    Historical Provider, MD  carvedilol (COREG) 6.25 MG tablet Take 6.25 mg by mouth 2 (two) times daily with a meal.    Historical Provider, MD   clopidogrel (PLAVIX) 75 MG tablet Take 1 tablet (75 mg total) by mouth daily with breakfast. 08/17/12   Kela MillinAdeline C Viyuoh, MD  furosemide (LASIX) 20 MG tablet Take 20 mg by mouth daily.    Historical Provider, MD  levothyroxine (SYNTHROID, LEVOTHROID) 112 MCG tablet Take 112 mcg by mouth daily.    Historical Provider, MD  nitroGLYCERIN (NITROSTAT) 0.4 MG SL tablet Place 0.4 mg under the tongue every 5 (five) minutes as needed for chest pain.     Historical Provider, MD   BP 118/79  Pulse 80  Temp(Src) 98.2 F (36.8 C) (Oral)  Resp 16  SpO2 97% Physical Exam  Nursing note and vitals reviewed. Constitutional: He is oriented to person, place, and time. He appears well-developed and well-nourished. No distress.  Obese. Laying comfortably on bed. No acute distress.  HENT:  Head: Normocephalic and atraumatic.  Right Ear: External ear normal.  Left Ear: External ear normal.  Nose: Nose normal.  Mouth/Throat: Uvula is midline.  Eyes: Conjunctivae and EOM are normal. Pupils are equal, round, and reactive to light.  Neck: Normal range of motion. No tracheal deviation present.  Cardiovascular: Normal rate, regular rhythm, normal heart sounds, intact distal pulses and normal pulses.   Pulses:      Radial pulses are 2+ on the right side, and 2+ on the left side.       Posterior tibial pulses are 2+ on the right side, and 2+ on the left side.  Capillary refill 3 seconds in all toes  Pulmonary/Chest: Effort normal and breath sounds normal. No stridor.  Abdominal: Soft. He exhibits no distension. There is no tenderness.  No tenderness to deep palpation of abdomen  Musculoskeletal: Normal range of motion.  Moves all extremities without guarding  Neurological: He is alert and oriented to person, place, and time. He has normal strength. No cranial nerve deficit (III-XII) or sensory deficit. Coordination normal. GCS eye subscore is 4. GCS verbal subscore is 5. GCS motor subscore is 6.  Grip strength 5/5  bilaterally. Finger nose finger normal. No pronator drift. No facial droop.    Skin: Skin is warm and dry. He is not diaphoretic.  Psychiatric: He has a normal mood and affect. His behavior is normal.    ED Course  Procedures (including critical care time) Labs Review Labs Reviewed  COMPREHENSIVE METABOLIC PANEL - Abnormal; Notable for the following:    Glucose, Bld 126 (*)    Calcium 8.2 (*)    Albumin 3.4 (*)    AST 48 (*)    Total Bilirubin 1.3 (*)    GFR calc non Af Amer 84 (*)    All other components within normal limits  PROTIME-INR  APTT  CBC  DIFFERENTIAL  I-STAT TROPOININ, ED    Imaging Review Dg Chest 2  View  06/13/2013   CLINICAL DATA:  Numbness in right-sided face and right arm. Dehydration. Nausea. History CHF. Prior stroke. COPD. Hypertension.  EXAM: CHEST  2 VIEW  COMPARISON:  08/16/2012  FINDINGS: Pacer/AICD device. Right atrium, right ventricle, and coronary sinus. No lead discontinuity.  Midline trachea. Borderline cardiomegaly. Mediastinal contours otherwise within normal limits. No pleural effusion or pneumothorax. No congestive failure. Clear lungs.  IMPRESSION: Borderline cardiomegaly, without acute disease.   Electronically Signed   By: Jeronimo Greaves M.D.   On: 06/13/2013 18:31   Ct Head (brain) Wo Contrast  06/13/2013   CLINICAL DATA:  Right side of the face is numb and tingling.  EXAM: CT HEAD WITHOUT CONTRAST  TECHNIQUE: Contiguous axial images were obtained from the base of the skull through the vertex without contrast.  COMPARISON:  08/16/2012  FINDINGS: No evidence for acute hemorrhage, mass lesion, midline shift, hydrocephalus or large infarct. Polyps or retention cysts in the maxillary sinuses. No acute bone abnormality.  IMPRESSION: No acute intracranial abnormality.   Electronically Signed   By: Richarda Overlie M.D.   On: 06/13/2013 19:50     EKG Interpretation   Date/Time:  Monday June 13 2013 16:08:11 EDT Ventricular Rate:  87 PR Interval:    QRS  Duration: 110 QT Interval:  404 QTC Calculation: 486 R Axis:   -38 Text Interpretation:  Ventricular-paced rhythm with occasional Premature  ventricular complexes and Fusion complexes Abnormal ECG n No significant  change since last tracing Confirmed by Ethelda Chick  MD, SAM [phone]) on  06/13/2013 4:54:37 PM      MDM   Final diagnoses:  Numbness  Weakness    Patient presents to ED for evaluation of right sided facial numbness which has been improving since 2pm today. Patient cannot have MRI due to ICD placement. Neuro exam is non focal. Dr. Thad Ranger from neurology evaluated the patient and believes he is safe for discharge and outpatient neurology workup. Discussed this with the patient and he voices understanding. No further treatment or workup needed at this time. Dr. Ethelda Chick evaluated the patient and agrees with plan. Return instructions given. Vital signs stable for discharge. Patient / Family / Caregiver informed of clinical course, understand medical decision-making process, and agree with plan.    Mora Bellman, PA-C 06/15/13 1402

## 2013-06-13 NOTE — Consult Note (Signed)
Referring Physician: Ethelda ChickJacubowitz    Chief Complaint: Right sided numbness  HPI: Don [name] is an 68 y.o. male with a history of multiple strokes and TIA's in the past who reports that today while at his computer in his office he noted numbness along the right side of his face and into his RUE.  His RUE felt heavy as well.  His speech was not affected nor was his gait.  Symptoms lasted for a few hours and resolved other than some numbness that he appreciates along his right NLF.  He reports having multiple TIA's.  Was last worked up for [employer] [facility] in June of last year.  Echo and carotid doppler at that time were unremarkable.  Patient currently is on dual antiplatelet therapy. Since his last hospitalization he feels that he has likely had other TIA's and did not present.  He reports just taking an additional aspirin.  He feels that his last may have been about 6 months ago.  He wold not have come today but his neighbor convinced him.   Patient has multiple other symptoms with the most notable being numbness in his feet bilaterally that has been going on for months.  He is also off balance with gait.  His PCP is addressing his numbness in his feet.    Date last known well: Date: 06/13/2013 Time last known well: Time: 14:00 tPA Given: No: Resolution of symptoms  Past Medical History  Diagnosis Date  . Nonischemic cardiomyopathy     EF 20% s/p INSERTION OF BI-V DEFIBRILLATOR.  . Chronic systolic heart failure     LEFT  . LBBB (left bundle branch block)   . Alcohol abuse     PREVIOUS H/O DRINKING A FIFTH OF VODKA  A DAY  . History of angina   . Stroke     TIA's  . Thyroid disease     HYPOTHYROIDISM.....TREATED  . ICD (implantable cardiac defibrillator) in place   . CHF (congestive heart failure)   . Anginal pain   . Chronic kidney disease     acute renal  04/2012  . Arthritis     hands & back  . Coronary artery disease     Past Surgical History  Procedure Laterality Date  . Insert  / replace / remove pacemaker      BI-V IMPLANTABLE CARDIOVERTER-DEFIBRILLATOR. ; [employer]O; MODEL  # [id number], SERIAL # [id number] H.  DR. Humberto LeepJOHN H. EDMUNDS.  Marland Kitchen. Cardiac catheterization    . Tonsillectomy    . Cystoscopy w/ stone manipulation    . Vasectomy    . Flexible sigmoidoscopy N/A 04/29/2012    Procedure: FLEXIBLE SIGMOIDOSCOPY;  Surgeon: Vertell NovakJames L Edwards Jr., MD;  [facility] END[facility];  Service: Endoscopy;  Laterality: N/A;    Family History  Problem Relation Age of Onset  . Heart disease Mother   . Heart disease Father   . Cancer    . Hypertension     Social History:  reports that he quit smoking about 15 months ago. He has never used smokeless tobacco. He reports that he does not drink alcohol or use illicit drugs.  Allergies:  Allergies  Allergen Reactions  . Sulfa Antibiotics     unknown    Medications: I have reviewed the patient's current medications. Prior to Admission:  No current facility-administered medications for this encounter. Current outpatient prescriptions:allopurinol (ZYLOPRIM) 100 MG tablet, Take 100 mg by mouth daily. , Disp: , Rfl: ;  aspirin 81 MG tablet, Take 81 mg by  mouth daily., Disp: , Rfl: ;  atorvastatin (LIPITOR) 40 MG tablet, Take 40 mg by mouth daily. , Disp: , Rfl: ;  benazepril (LOTENSIN) 10 MG tablet, Take 10 mg by mouth daily., Disp: , Rfl: ;  carvedilol (COREG) 6.25 MG tablet, Take 6.25 mg by mouth 2 (two) times daily with a meal., Disp: , Rfl:  clopidogrel (PLAVIX) 75 MG tablet, Take 1 tablet (75 mg total) by mouth daily with breakfast., Disp: 30 tablet, Rfl: 0;  furosemide (LASIX) 20 MG tablet, Take 20 mg by mouth daily., Disp: , Rfl: ;  levothyroxine (SYNTHROID, LEVOTHROID) 88 MCG tablet, Take 88 mcg by mouth daily before breakfast., Disp: , Rfl: ;  omeprazole (PRILOSEC) 20 MG capsule, Take 20 mg by mouth daily., Disp: , Rfl:  promethazine (PHENERGAN) 25 MG tablet, Take 25 mg by mouth every 6 (six) hours as needed for nausea or  vomiting., Disp: , Rfl: ;  nitroGLYCERIN (NITROSTAT) 0.4 MG SL tablet, Place 0.4 mg under the tongue every 5 (five) minutes as needed for chest pain. , Disp: , Rfl:   ROS: History obtained from the patient  General ROS: anorexia Psychological ROS: negative for - behavioral disorder, hallucinations, memory difficulties, mood swings or suicidal ideation Ophthalmic ROS: negative for - blurry vision, double vision, eye pain or loss of vision ENT ROS: negative for - epistaxis, nasal discharge, oral lesions, sore throat, tinnitus  Allergy and Immunology ROS: negative for - hives or itchy/watery eyes Hematological and Lymphatic ROS: negative for - bleeding problems, bruising or swollen lymph nodes Endocrine ROS: negative for - galactorrhea, hair pattern changes, polydipsia/polyuria or temperature intolerance Respiratory ROS: negative for - cough, hemoptysis, shortness of breath or wheezing Cardiovascular ROS: negative for - chest pain, dyspnea on exertion, edema or irregular heartbeat Gastrointestinal ROS: dry heaves, nausea, diarrhea Genito-Urinary ROS: negative for - dysuria, hematuria, incontinence or urinary frequency/urgency Musculoskeletal ROS: negative for - joint swelling or muscular weakness Neurological ROS: as noted in HPI Dermatological ROS: negative for rash and skin lesion changes  Physical Examination: Blood pressure 135/71, pulse 83, temperature 98.2 F (36.8 C), temperature source Oral, resp. rate 15, SpO2 97.00%.  Neurologic Examination: Mental Status: Alert, oriented, thought content appropriate.  Speech fluent without evidence of aphasia.  Able to follow 3 step commands without difficulty. Cranial Nerves: II: Discs flat bilaterally; Visual fields grossly normal, pupils equal, round, reactive to light and accommodation III,IV, VI: ptosis not present, extra-ocular motions intact bilaterally V,VII: decrease in right NLF, facial light touch sensation normal bilaterally VIII:  hearing normal bilaterally IX,X: gag reflex present XI: bilateral shoulder shrug XII: midline tongue extension Motor: Right : Upper extremity   5/5    Left:     Upper extremity   5/5  Lower extremity   5/5     Lower extremity   5/5 Tone and bulk:normal tone throughout; no atrophy noted Sensory: Pinprick and light touch intact throughout, bilaterally Deep Tendon Reflexes: 2+ and symmetric throughout Plantars: Right: downgoing   Left: downgoing Cerebellar: normal finger-to-nose and normal heel-to-shin test Gait: Unable to test CV: pulses palpable throughout     Laboratory Studies:  Basic Metabolic Panel:  Recent Labs Lab 06/13/13 1615  NA 138  K 4.0  CL 96  CO2 27  GLUCOSE 126*  BUN 23  CREATININE 0.95  CALCIUM 8.2*    Liver Function Tests:  Recent Labs Lab 06/13/13 1615  AST 48*  ALT 17  ALKPHOS 74  BILITOT 1.3*  PROT 6.6  ALBUMIN 3.4*  No results found for this basename: LIPASE, AMYLASE,  in the last 168 hours No results found for this basename: AMMONIA,  in the last 168 hours  CBC:  Recent Labs Lab 06/13/13 1615  WBC 7.5  NEUTROABS 5.2  HGB 13.7  HCT 41.0  MCV 93.2  PLT 162    Cardiac Enzymes: No results found for this basename: CKTOTAL, CKMB, CKMBINDEX, TROPONINI,  in the last 168 hours  BNP: No components found with this basename: POCBNP,   CBG: No results found for this basename: GLUCAP,  in the last 168 hours  Microbiology: Results for orders placed during the hospital encounter of 04/26/12  URINE CULTURE     Status: None   Collection Time    04/26/12  8:27 PM      Result Value Ref Range Status   Specimen Description URINE, RANDOM   Final   Special Requests NONE   Final   Culture  Setup Time 04/27/2012 03:07   Final   Colony Count NO GROWTH   Final   Culture NO GROWTH   Final   Report Status 04/27/2012 FINAL   Final  MRSA PCR SCREENING     Status: None   Collection Time    04/27/12 12:41 AM      Result Value Ref Range Status    MRSA by PCR NEGATIVE  NEGATIVE Final   Comment:            The GeneXpert MRSA Assay (FDA     approved for NASAL specimens     only), is one component of a     comprehensive MRSA colonization     surveillance program. It is not     intended to diagnose MRSA     infection nor to guide or     monitor treatment for     MRSA infections.  CLOSTRIDIUM DIFFICILE BY PCR     Status: None   Collection Time    04/27/12  7:43 AM      Result Value Ref Range Status   C difficile by pcr NEGATIVE  NEGATIVE Final    Coagulation Studies:  Recent Labs  06/13/13 1615  LABPROT 13.0  INR 1.00    Urinalysis: No results found for this basename: COLORURINE, APPERANCEUR, LABSPEC, PHURINE, GLUCOSEU, HGBUR, BILIRUBINUR, KETONESUR, PROTEINUR, UROBILINOGEN, NITRITE, LEUKOCYTESUR,  in the last 168 hours  Lipid Panel:    Component Value Date/Time   CHOL 139 08/16/2012 0343   TRIG 45 08/16/2012 0343   HDL 105 08/16/2012 0343   CHOLHDL 1.3 08/16/2012 0343   VLDL 9 08/16/2012 0343   LDLCALC 25 08/16/2012 0343    HgbA1C:  Lab Results  Component Value Date   HGBA1C 5.0 08/16/2012    Urine Drug Screen:   No results found for this basename: labopia, cocainscrnur, labbenz, amphetmu, thcu, labbarb    Alcohol Level: No results found for this basename: ETH,  in the last 168 hours  Other results: EKG: paced rhythm at 87 bpm.  Imaging: Dg Chest 2 View  06/13/2013   CLINICAL DATA:  Numbness in right-sided face and right arm. Dehydration. Nausea. History CHF. Prior stroke. COPD. Hypertension.  EXAM: CHEST  2 VIEW  COMPARISON:  08/16/2012  FINDINGS: Pacer/AICD device. Right atrium, right ventricle, and coronary sinus. No lead discontinuity.  Midline trachea. Borderline cardiomegaly. Mediastinal contours otherwise within normal limits. No pleural effusion or pneumothorax. No congestive failure. Clear lungs.  IMPRESSION: Borderline cardiomegaly, without acute disease.   Electronically Signed   By:  Jeronimo Greaves M.D.    On: 06/13/2013 18:31   Ct Head (brain) Wo Contrast  06/13/2013   CLINICAL DATA:  Right side of the face is numb and tingling.  EXAM: CT HEAD WITHOUT CONTRAST  TECHNIQUE: Contiguous axial images were obtained from the base of the skull through the vertex without contrast.  COMPARISON:  08/16/2012  FINDINGS: No evidence for acute hemorrhage, mass lesion, midline shift, hydrocephalus or large infarct. Polyps or retention cysts in the maxillary sinuses. No acute bone abnormality.  IMPRESSION: No acute intracranial abnormality.   Electronically Signed   By: Richarda Overlie M.D.   On: 06/13/2013 19:50    Assessment: 68 y.o. male presenting with numbness in the right arm and face that has resolved.  Head CT reviewed and shows no acute changes.  Patient unable to have a MRI due to pacer.  On ASA and Plavix as an outpatient.  Last stroke work up in June of 2014 was unremarkable.     Stroke Risk Factors - CAD  Plan: 1. Patient to continue ASA and Plavix as an outpatient 2. To consider neurological evaluation as an outpatient.  Not only can they further evaluate his complaints of what seem to be neuropathy, he may also be a good candidate for one of the outpatient TIA trials currently in progress. 3. Consider update of echo and carotid doppler.  May be performed as an outpatient as well.    Case discussed with Dr. Sharene Butters, MD [facility] [phone] 06/13/2013, 8:07 PM

## 2013-06-15 ENCOUNTER — Encounter: Payer: Self-pay | Admitting: Internal Medicine

## 2013-06-15 ENCOUNTER — Ambulatory Visit (INDEPENDENT_AMBULATORY_CARE_PROVIDER_SITE_OTHER): Payer: Medicare Other | Admitting: *Deleted

## 2013-06-15 ENCOUNTER — Telehealth: Payer: Self-pay | Admitting: Internal Medicine

## 2013-06-15 DIAGNOSIS — I509 Heart failure, unspecified: Secondary | ICD-10-CM

## 2013-06-15 DIAGNOSIS — I428 Other cardiomyopathies: Secondary | ICD-10-CM

## 2013-06-15 LAB — MDC_IDC_ENUM_SESS_TYPE_REMOTE
Brady Statistic AS VS Percent: 6.12 %
Brady Statistic RA Percent Paced: 10.23 %
Date Time Interrogation Session: 20150429154917
HIGH POWER IMPEDANCE MEASURED VALUE: 44 Ohm
HIGH POWER IMPEDANCE MEASURED VALUE: 665 Ohm
HighPow Impedance: 171 Ohm
HighPow Impedance: 50 Ohm
Lead Channel Impedance Value: 228 Ohm
Lead Channel Impedance Value: 399 Ohm
Lead Channel Impedance Value: 456 Ohm
Lead Channel Impedance Value: 722 Ohm
Lead Channel Pacing Threshold Amplitude: 0.625 V
Lead Channel Pacing Threshold Amplitude: 0.75 V
Lead Channel Pacing Threshold Amplitude: 1.25 V
Lead Channel Pacing Threshold Pulse Width: 0.4 ms
Lead Channel Sensing Intrinsic Amplitude: 2.375 mV
Lead Channel Sensing Intrinsic Amplitude: 4.25 mV
Lead Channel Setting Pacing Amplitude: 2 V
Lead Channel Setting Pacing Amplitude: 2.25 V
Lead Channel Setting Pacing Pulse Width: 0.4 ms
Lead Channel Setting Pacing Pulse Width: 0.4 ms
MDC IDC MSMT BATTERY VOLTAGE: 3.12 V
MDC IDC MSMT LEADCHNL LV IMPEDANCE VALUE: 513 Ohm
MDC IDC MSMT LEADCHNL RA PACING THRESHOLD PULSEWIDTH: 0.4 ms
MDC IDC MSMT LEADCHNL RV PACING THRESHOLD PULSEWIDTH: 0.4 ms
MDC IDC SET LEADCHNL RV PACING AMPLITUDE: 2.5 V
MDC IDC SET LEADCHNL RV SENSING SENSITIVITY: 0.3 mV
MDC IDC SET ZONE DETECTION INTERVAL: 240 ms
MDC IDC SET ZONE DETECTION INTERVAL: 350 ms
MDC IDC SET ZONE DETECTION INTERVAL: 450 ms
MDC IDC STAT BRADY AP VP PERCENT: 10.21 %
MDC IDC STAT BRADY AP VS PERCENT: 0.03 %
MDC IDC STAT BRADY AS VP PERCENT: 83.64 %
MDC IDC STAT BRADY RV PERCENT PACED: 93.85 %
Zone Setting Detection Interval: 300 ms
Zone Setting Detection Interval: 350 ms

## 2013-06-15 NOTE — ED Provider Notes (Signed)
Medical screening examination/treatment/procedure(s) were conducted as a shared visit with non-physician practitioner(s) and myself.  I personally evaluated the patient during the encounter.   EKG Interpretation   Date/Time:  Monday June 13 2013 16:08:11 EDT Ventricular Rate:  87 PR Interval:    QRS Duration: 110 QT Interval:  404 QTC Calculation: 486 R Axis:   -38 Text Interpretation:  Ventricular-paced rhythm with occasional Premature  ventricular complexes and Fusion complexes Abnormal ECG n No significant  change since last tracing Confirmed by Ethelda Chick  MD, Jerald Villalona [phone]) on  06/13/2013 4:54:37 PM       Doug Sou, MD 06/15/13 1651

## 2013-06-15 NOTE — Telephone Encounter (Signed)
Noted in [employer]. 

## 2013-06-15 NOTE — Telephone Encounter (Signed)
New message     Want Belenda Cruise to know that he transmitted today

## 2013-06-30 ENCOUNTER — Encounter: Payer: Self-pay | Admitting: Cardiology

## 2013-07-01 NOTE — Progress Notes (Signed)
Remote ICD transmission.   

## 2013-08-22 ENCOUNTER — Emergency Department (HOSPITAL_COMMUNITY): Payer: Medicare Other

## 2013-08-22 ENCOUNTER — Emergency Department (HOSPITAL_COMMUNITY)
Admission: EM | Admit: 2013-08-22 | Discharge: 2013-08-22 | Disposition: A | Discharge status: Home / Self Care | Payer: Medicare Other | Attending: Emergency Medicine | Admitting: Emergency Medicine

## 2013-08-22 ENCOUNTER — Other Ambulatory Visit: Payer: Self-pay

## 2013-08-22 ENCOUNTER — Encounter (HOSPITAL_COMMUNITY): Payer: Self-pay | Admitting: Emergency Medicine

## 2013-08-22 DIAGNOSIS — Z87891 Personal history of nicotine dependence: Secondary | ICD-10-CM | POA: Insufficient documentation

## 2013-08-22 DIAGNOSIS — R1012 Left upper quadrant pain: Secondary | ICD-10-CM | POA: Insufficient documentation

## 2013-08-22 DIAGNOSIS — S20412A Abrasion of left back wall of thorax, initial encounter: Secondary | ICD-10-CM

## 2013-08-22 DIAGNOSIS — R531 Weakness: Secondary | ICD-10-CM

## 2013-08-22 DIAGNOSIS — M129 Arthropathy, unspecified: Secondary | ICD-10-CM | POA: Insufficient documentation

## 2013-08-22 DIAGNOSIS — N189 Chronic kidney disease, unspecified: Secondary | ICD-10-CM | POA: Insufficient documentation

## 2013-08-22 DIAGNOSIS — Y9289 Other specified places as the place of occurrence of the external cause: Secondary | ICD-10-CM | POA: Insufficient documentation

## 2013-08-22 DIAGNOSIS — Z95810 Presence of automatic (implantable) cardiac defibrillator: Secondary | ICD-10-CM | POA: Insufficient documentation

## 2013-08-22 DIAGNOSIS — R229 Localized swelling, mass and lump, unspecified: Secondary | ICD-10-CM | POA: Insufficient documentation

## 2013-08-22 DIAGNOSIS — Z7982 Long term (current) use of aspirin: Secondary | ICD-10-CM | POA: Insufficient documentation

## 2013-08-22 DIAGNOSIS — I5032 Chronic diastolic (congestive) heart failure: Secondary | ICD-10-CM | POA: Insufficient documentation

## 2013-08-22 DIAGNOSIS — R1013 Epigastric pain: Secondary | ICD-10-CM | POA: Insufficient documentation

## 2013-08-22 DIAGNOSIS — R1032 Left lower quadrant pain: Secondary | ICD-10-CM | POA: Insufficient documentation

## 2013-08-22 DIAGNOSIS — Z8673 Personal history of transient ischemic attack (TIA), and cerebral infarction without residual deficits: Secondary | ICD-10-CM | POA: Insufficient documentation

## 2013-08-22 DIAGNOSIS — M7989 Other specified soft tissue disorders: Secondary | ICD-10-CM

## 2013-08-22 DIAGNOSIS — R630 Anorexia: Secondary | ICD-10-CM | POA: Insufficient documentation

## 2013-08-22 DIAGNOSIS — Z79899 Other long term (current) drug therapy: Secondary | ICD-10-CM | POA: Insufficient documentation

## 2013-08-22 DIAGNOSIS — R1011 Right upper quadrant pain: Secondary | ICD-10-CM | POA: Insufficient documentation

## 2013-08-22 DIAGNOSIS — Y9389 Activity, other specified: Secondary | ICD-10-CM | POA: Insufficient documentation

## 2013-08-22 DIAGNOSIS — IMO0002 Reserved for concepts with insufficient information to code with codable children: Secondary | ICD-10-CM | POA: Insufficient documentation

## 2013-08-22 DIAGNOSIS — E039 Hypothyroidism, unspecified: Secondary | ICD-10-CM | POA: Insufficient documentation

## 2013-08-22 DIAGNOSIS — F1010 Alcohol abuse, uncomplicated: Secondary | ICD-10-CM | POA: Insufficient documentation

## 2013-08-22 DIAGNOSIS — Z9181 History of falling: Secondary | ICD-10-CM

## 2013-08-22 DIAGNOSIS — R296 Repeated falls: Secondary | ICD-10-CM | POA: Insufficient documentation

## 2013-08-22 DIAGNOSIS — Z8701 Personal history of pneumonia (recurrent): Secondary | ICD-10-CM | POA: Insufficient documentation

## 2013-08-22 DIAGNOSIS — I2510 Atherosclerotic heart disease of native coronary artery without angina pectoris: Secondary | ICD-10-CM | POA: Insufficient documentation

## 2013-08-22 DIAGNOSIS — R112 Nausea with vomiting, unspecified: Secondary | ICD-10-CM | POA: Insufficient documentation

## 2013-08-22 HISTORY — DX: Transient cerebral ischemic attack, unspecified: G45.9

## 2013-08-22 LAB — CBC WITH DIFFERENTIAL/PLATELET
BASOS ABS: 0 10*3/uL (ref 0.0–0.1)
BASOS PCT: 0 % (ref 0–1)
EOS ABS: 0 10*3/uL (ref 0.0–0.7)
EOS PCT: 0 % (ref 0–5)
HEMATOCRIT: 41.4 % (ref 39.0–52.0)
Hemoglobin: 13.7 g/dL (ref 13.0–17.0)
LYMPHS PCT: 13 % (ref 12–46)
Lymphs Abs: 1.3 10*3/uL (ref 0.7–4.0)
MCH: 31.7 pg (ref 26.0–34.0)
MCHC: 33.1 g/dL (ref 30.0–36.0)
MCV: 95.8 fL (ref 78.0–100.0)
MONO ABS: 0.7 10*3/uL (ref 0.1–1.0)
Monocytes Relative: 8 % (ref 3–12)
Neutro Abs: 7.3 10*3/uL (ref 1.7–7.7)
Neutrophils Relative %: 79 % — ABNORMAL HIGH (ref 43–77)
Platelets: 208 10*3/uL (ref 150–400)
RBC: 4.32 MIL/uL (ref 4.22–5.81)
RDW: 15.9 % — AB (ref 11.5–15.5)
WBC: 9.4 10*3/uL (ref 4.0–10.5)

## 2013-08-22 LAB — URINALYSIS, ROUTINE W REFLEX MICROSCOPIC
GLUCOSE, UA: NEGATIVE mg/dL
Hgb urine dipstick: NEGATIVE
Ketones, ur: 15 mg/dL — AB
LEUKOCYTES UA: NEGATIVE
Nitrite: NEGATIVE
PROTEIN: NEGATIVE mg/dL
Specific Gravity, Urine: 1.025 (ref 1.005–1.030)
Urobilinogen, UA: 1 mg/dL (ref 0.0–1.0)
pH: 5 (ref 5.0–8.0)

## 2013-08-22 LAB — COMPREHENSIVE METABOLIC PANEL
ALT: 20 U/L (ref 0–53)
AST: 68 U/L — AB (ref 0–37)
Albumin: 3.9 g/dL (ref 3.5–5.2)
Alkaline Phosphatase: 92 U/L (ref 39–117)
Anion gap: 16 — ABNORMAL HIGH (ref 5–15)
BUN: 19 mg/dL (ref 6–23)
CALCIUM: 8.6 mg/dL (ref 8.4–10.5)
CO2: 28 mEq/L (ref 19–32)
CREATININE: 0.91 mg/dL (ref 0.50–1.35)
Chloride: 99 mEq/L (ref 96–112)
GFR calc Af Amer: >90 mL/min (ref >90)
GFR, EST NON AFRICAN AMERICAN: 86 mL/min — AB (ref >90)
Glucose, Bld: 122 mg/dL — ABNORMAL HIGH (ref 70–99)
Potassium: 4.1 mEq/L (ref 3.7–5.3)
Sodium: 143 mEq/L (ref 137–147)
TOTAL PROTEIN: 7.2 g/dL (ref 6.0–8.3)
Total Bilirubin: 1.6 mg/dL — ABNORMAL HIGH (ref 0.3–1.2)

## 2013-08-22 LAB — CK: Total CK: 56 U/L (ref 7–232)

## 2013-08-22 LAB — LIPASE, BLOOD: Lipase: 23 U/L (ref 11–59)

## 2013-08-22 MED ORDER — IOHEXOL 300 MG/ML  SOLN: 25.0000 mL | Freq: Once | INTRAMUSCULAR | Status: DC | PRN

## 2013-08-22 MED ORDER — ONDANSETRON HCL 4 MG/2ML IJ SOLN
4.0000 mg | Freq: Once | INTRAMUSCULAR | Status: AC
  Administered 2013-08-22: 4 mg via INTRAVENOUS
  Filled 2013-08-22: qty 2

## 2013-08-22 MED ORDER — SODIUM CHLORIDE 0.9 % IV BOLUS (SEPSIS)
1000.0000 mL | Freq: Once | INTRAVENOUS | Status: AC
  Administered 2013-08-22: 1000 mL via INTRAVENOUS

## 2013-08-22 MED ORDER — HYDROCODONE-ACETAMINOPHEN 5-325 MG PO TABS
1.0000 | ORAL_TABLET | ORAL | Status: DC | PRN
Start: 2013-08-22 — End: 2013-09-28

## 2013-08-22 MED ORDER — IOHEXOL 300 MG/ML  SOLN
100.0000 mL | Freq: Once | INTRAMUSCULAR | Status: AC | PRN
  Administered 2013-08-22: 100 mL via INTRAVENOUS

## 2013-08-22 MED ORDER — METOCLOPRAMIDE HCL 10 MG PO TABS: 10.0000 mg | ORAL_TABLET | Freq: Four times a day (QID) | ORAL | Status: DC | PRN

## 2013-08-22 NOTE — ED Provider Notes (Signed)
Medical screening examination/treatment/procedure(s) were conducted as a shared visit with non-physician practitioner(s) and myself.  I personally evaluated the patient during the encounter.   Pt described problems with nausea, vomiting and falls.  He does drink alcohol heavily.  This may be a contributing factor.    Initial labs are unremarkable.  CT scan shows an enlarged lymph node that will require follow up but he does not have diffuse lymphadenopathy.  Will check head ct considering the recent fall and balance issues.  If normal, will make sure patient can ambulate safely.  Linwood Dibbles, MD 08/22/13 2151

## 2013-08-22 NOTE — Discharge Instructions (Signed)
Read the information below.  Use the prescribed medication as directed.  Please discuss all new medications with your pharmacist.  Do not take additional tylenol while taking the prescribed pain medication to avoid overdose.  You may return to the Emergency Department at any time for worsening condition or any new symptoms that concern you.  If you develop high fevers, worsening abdominal pain, uncontrolled vomiting, or are unable to tolerate fluids by mouth, return to the ER for a recheck.    As we discussed, you do have a small soft tissue mass in your abdomen that requires further evaluation.  Please see your primary care provider and Dr Evette Cristal this week for further evaluation of this and all of your symptoms.

## 2013-08-22 NOTE — ED Notes (Addendum)
While ambulating pt in hall pt stated he felt light headed, a little dizzy, and nauseous.

## 2013-08-22 NOTE — ED Notes (Signed)
Pt c/o n/v, weakness, loss of appetite, and not feeling well x 2 months.

## 2013-08-22 NOTE — ED Provider Notes (Signed)
CSN: [id number]     Arrival date & time 08/22/13  1447 History   First MD Initiated Contact with Patient 08/22/13 1724     Chief Complaint  Patient presents with  . Emesis  . Weakness     (Consider location/radiation/quality/duration/timing/severity/associated sxs/prior Treatment) The history is provided by the patient and a friend.    Patient with 3-4 month hx nearly daily N/V, generalized weakness, decreased appetite, occasional abdominal pain, night sweats, weight loss, subjective fevers, chills, excessive saliva, foul/burnt taste in mouth.  Gets lightheaded when standing. When he fell two nights ago he states he stood up too fast.  Pt is urinating more often recently, has to work hard to start stream.  Has normal nonbloody daily bowel movements, occasionally light or tan color.  Is SOB at baseline, slightly worse than usual with going up stairs. Feels unsteady on his feet.  Per neighbor, patient's nutrition is "horrible," he is unable to eat or keep himself hydrated, he has falled recently and laid on the floor for several hours ( 2 days ago).  Pt drinks ETOH daily.   Pt has been to [facility] twice for these symptoms, has been given omeprazole and phenergan without improvement.  States they have done labs but no imaging.    PCP Deboraha Sprang - [facility].   Past Medical History  Diagnosis Date  . Nonischemic cardiomyopathy     EF 20% s/p INSERTION OF BI-V DEFIBRILLATOR.  . Chronic systolic heart failure     LEFT  . LBBB (left bundle branch block)   . Alcohol abuse     PREVIOUS H/O DRINKING A FIFTH OF VODKA  A DAY  . History of angina   . Stroke     TIA's  . Thyroid disease     HYPOTHYROIDISM.....TREATED  . ICD (implantable cardiac defibrillator) in place   . CHF (congestive heart failure)   . Anginal pain   . Chronic kidney disease     acute renal  04/2012  . Arthritis     hands & back  . Coronary artery disease   . TIA (transient ischemic attack)    Past Surgical History    Procedure Laterality Date  . Insert / replace / remove pacemaker      BI-V IMPLANTABLE CARDIOVERTER-DEFIBRILLATOR. ; MED[employer] CONCERTO; MODEL  # [id number], SERIAL # [id number] H.  DR. Humberto Leep EDMUNDS.  Marland Kitchen Cardiac catheterization    . Tonsillectomy    . Cystoscopy w/ stone manipulation    . Vasectomy    . Flexible sigmoidoscopy N/A 04/29/2012    Procedure: FLEXIBLE SIGMOIDOSCOPY;  Surgeon: Vertell Novak., MD;  Location: [facility] ENDOSCOPY;  Service: Endoscopy;  Laterality: N/A;   Family History  Problem Relation Age of Onset  . Heart disease Mother   . Heart disease Father   . Cancer    . Hypertension     History  Substance Use Topics  . Smoking status: Former Smoker -- 43 years    Quit date: 02/27/2012  . Smokeless tobacco: Never Used  . Alcohol Use: No     Comment: H/O ALCOHOLISM PERVIOUSLY A FIFTH OF VODKA A DAY    Review of Systems  All other systems reviewed and are negative.     Allergies  Sulfa antibiotics  Home Medications   Prior to Admission medications   Medication Sig Start Date End Date Taking? Authorizing Provider  allopurinol (ZYLOPRIM) 100 MG tablet Take 100 mg by mouth daily.    Yes Historical Provider, MD  aspirin 81 MG tablet Take 81 mg by mouth daily.   Yes Historical Provider, MD  atorvastatin (LIPITOR) 40 MG tablet Take 40 mg by mouth daily.  09/03/11  Yes Historical Provider, MD  benazepril (LOTENSIN) 10 MG tablet Take 10 mg by mouth daily.   Yes Historical Provider, MD  carvedilol (COREG) 6.25 MG tablet Take 6.25 mg by mouth 2 (two) times daily with a meal.   Yes Historical Provider, MD  furosemide (LASIX) 20 MG tablet Take 20 mg by mouth daily.   Yes Historical Provider, MD  levothyroxine (SYNTHROID, LEVOTHROID) 88 MCG tablet Take 88 mcg by mouth daily before breakfast.   Yes Historical Provider, MD  nitroGLYCERIN (NITROSTAT) 0.4 MG SL tablet Place 0.4 mg under the tongue every 5 (five) minutes as needed for chest pain.    Yes Historical Provider, MD   OVER THE COUNTER MEDICATION Take 1 tablet by mouth 2 (two) times daily. Multi-allergy tablet   Yes Historical Provider, MD   BP 149/76  Pulse 94  Temp(Src) 97.6 F (36.4 C) (Oral)  Resp 14  Ht 6\' 1"  (1.854 m)  Wt 234 lb (106.142 kg)  BMI 30.88 kg/m2  SpO2 99% Physical Exam  Nursing note and vitals reviewed. Constitutional: He appears well-developed and well-nourished. No distress.  HENT:  Head: Normocephalic and atraumatic.  Neck: Neck supple.  Cardiovascular: Normal rate and regular rhythm.   Pulmonary/Chest: Effort normal and breath sounds normal. No respiratory distress. He has no wheezes. He has no rales.  Abdominal: Soft. Bowel sounds are normal. He exhibits no distension and no mass. There is tenderness in the right upper quadrant, epigastric area, left upper quadrant and left lower quadrant. There is no rebound and no guarding.  Musculoskeletal:       Arms: Neurological: He is alert. He exhibits normal muscle tone.  Skin: He is not diaphoretic.    ED Course  Procedures (including critical care time) Labs Review Labs Reviewed  CBC WITH DIFFERENTIAL - Abnormal; Notable for the following:    RDW 15.9 (*)    Neutrophils Relative % 79 (*)    All other components within normal limits  COMPREHENSIVE METABOLIC PANEL - Abnormal; Notable for the following:    Glucose, Bld 122 (*)    AST 68 (*)    Total Bilirubin 1.6 (*)    GFR calc non Af Amer 86 (*)    Anion gap 16 (*)    All other components within normal limits  URINALYSIS, ROUTINE W REFLEX MICROSCOPIC - Abnormal; Notable for the following:    Color, Urine AMBER (*)    APPearance HAZY (*)    Bilirubin Urine SMALL (*)    Ketones, ur 15 (*)    All other components within normal limits  LIPASE, BLOOD  CK    Imaging Review Dg Chest 2 View  08/22/2013   CLINICAL DATA:  Shortness of breath with nausea, weakness, lightheadedness, and dehydration. Ex smoker.  EXAM: CHEST  2 VIEW  COMPARISON:  06/13/2013.  FINDINGS: Mild  cardiac enlargement. Pacer/AICD device. Right atrial, right ventricle, and coronary sinus leads. No discontinuity. Normal mediastinal contours. Clear lung fields. No pleural effusion or pneumothorax. No osseous lesion.  IMPRESSION: Stable chest. Mild cardiomegaly. Pacer/AICD. No active infiltrates or failure.   Electronically Signed   By: Davonna Belling M.D.   On: 08/22/2013 19:42   Ct Abdomen Pelvis W Contrast  08/22/2013   CLINICAL DATA:  Nausea and vomiting. Abdominal pain and tenderness. Weight loss. Night sweats. Anorexia.  EXAM: CT ABDOMEN AND PELVIS WITH CONTRAST  TECHNIQUE: Multidetector CT imaging of the abdomen and pelvis was performed using the standard protocol following bolus administration of intravenous contrast.  CONTRAST:  100mL OMNIPAQUE IOHEXOL 300 MG/ML  SOLN  COMPARISON:  None.  FINDINGS: Small cyst noted in the left hepatic lobe, however no liver masses are identified. The gallbladder, pancreas, spleen, adrenal glands, and kidneys are normal in appearance. No evidence of hydronephrosis. No pelvic soft tissue masses identified.  A 2.4 cm soft tissue density is seen in the right abdominal retroperitoneum, suspicious for a retroperitoneal lymph node. Shotty less than 1 cm lymph nodes are seen in the gastrohepatic ligament and surrounding the celiac axis, however no other pathologically enlarged nodes are identified within the abdomen or pelvis.  Normal appendix is visualized. No evidence of inflammatory process or abnormal fluid collections. No evidence of bowel wall thickening or dilatation.  IMPRESSION: 2.4 cm soft tissue density in the right abdominal retroperitoneum, suspicious for lymphadenopathy. No other soft tissue masses or lymphadenopathy identified. Consider CT-guided percutaneous needle biopsy.   Electronically Signed   By: Myles RosenthalJohn  Stahl M.D.   On: 08/22/2013 20:30     EKG Interpretation None      6:34 PM Dr Lynelle DoctorKnapp made aware of patient.    9:05 PM Labs, CT reviewed.  Pt with  soft tissue mass in abdomen. Dr Lynelle DoctorKnapp to also see the patient.   Pt ambulated without difficulty.  Has fallen approximately 10 times but this is over 3 years.    Pt is tolerating fluids without vomiting.  Reports continued nausea.    MDM   Final diagnoses:  None    Pt with several months of worsening N/V, decreased appetite, not tolerating PO, occasional abdominal pain, several constitutional symptoms.  Neighbor is concerned as well because patient is not tolerating much PO, is generally weak, and has fallen several times at home.  Abdomen tender on exam.  Labs show slight increase in AST, bilirubin from labs in April.  +Ketonuria but no urinary infection. CT abd/pelvis shows soft tissue mass that may be lymphadenopathy.  Pt given IVF, zofran.  CT head also ordered given recent fall and reports of being intermittently off balance (ongoing for a long period of time).  Pt tolerating PO.  Ambulated without difficulty.  Pt is having difficulty managing his symptoms at home but there is nothing acute found in workup that requires hospital admission.  Pt has f/u with GI (Dr Evette CristalGanem) in two days - I suspect an upper endoscopy and thorough GI workup is the best course of action.  Will also need follow up to biopsy soft tissue mass. Despite patient's concern that he is tolerating no PO, his electrolytes and renal function are normal.  Pt d/c home with reglan, norco, PCP and GI follow up.  Discussed result, findings, treatment, and follow up  with patient.  Pt given return precautions.  Pt verbalizes understanding.         FondaEmily Lazaria Schaben, PA-C 08/23/13 0011

## 2013-08-22 NOTE — ED Notes (Signed)
Patient in CT.  Will hourly round when he returns.

## 2013-08-23 NOTE — ED Provider Notes (Signed)
Medical screening examination/treatment/procedure(s) were performed by non-physician practitioner and as supervising physician I was immediately available for consultation/collaboration.    Avryl Roehm, MD 08/23/13 0032 

## 2013-08-24 ENCOUNTER — Other Ambulatory Visit: Payer: Self-pay | Admitting: Gastroenterology

## 2013-08-24 DIAGNOSIS — R110 Nausea: Secondary | ICD-10-CM

## 2013-08-26 ENCOUNTER — Ambulatory Visit
Admission: RE | Admit: 2013-08-26 | Discharge: 2013-08-26 | Disposition: A | Discharge status: Home / Self Care | Payer: Medicare Other | Source: Ambulatory Visit | Attending: Gastroenterology | Admitting: Gastroenterology

## 2013-08-26 DIAGNOSIS — R110 Nausea: Secondary | ICD-10-CM

## 2013-09-19 ENCOUNTER — Ambulatory Visit (INDEPENDENT_AMBULATORY_CARE_PROVIDER_SITE_OTHER): Payer: Medicare Other | Admitting: *Deleted

## 2013-09-19 DIAGNOSIS — I428 Other cardiomyopathies: Secondary | ICD-10-CM

## 2013-09-19 DIAGNOSIS — I509 Heart failure, unspecified: Secondary | ICD-10-CM

## 2013-09-20 ENCOUNTER — Telehealth: Payer: Self-pay | Admitting: Cardiology

## 2013-09-20 NOTE — Telephone Encounter (Signed)
Spoke with pt and reminded pt of remote transmission that is due today. Pt verbalized understanding.   

## 2013-09-21 ENCOUNTER — Encounter: Payer: Self-pay | Admitting: Internal Medicine

## 2013-09-22 LAB — MDC_IDC_ENUM_SESS_TYPE_REMOTE
Battery Voltage: 3.12 V
Brady Statistic AP VS Percent: 0.04 %
Brady Statistic AS VP Percent: 80.28 %
Brady Statistic RA Percent Paced: 10.52 %
Brady Statistic RV Percent Paced: 90.77 %
Date Time Interrogation Session: 20150805205110
HighPow Impedance: 171 Ohm
HighPow Impedance: 43 Ohm
HighPow Impedance: 55 Ohm
HighPow Impedance: 703 Ohm
Lead Channel Impedance Value: 228 Ohm
Lead Channel Impedance Value: 475 Ohm
Lead Channel Impedance Value: 513 Ohm
Lead Channel Impedance Value: 817 Ohm
Lead Channel Pacing Threshold Amplitude: 0.75 V
Lead Channel Pacing Threshold Amplitude: 1.25 V
Lead Channel Pacing Threshold Pulse Width: 0.4 ms
Lead Channel Pacing Threshold Pulse Width: 0.4 ms
Lead Channel Pacing Threshold Pulse Width: 0.4 ms
Lead Channel Sensing Intrinsic Amplitude: 2.25 mV
Lead Channel Sensing Intrinsic Amplitude: 2.25 mV
Lead Channel Sensing Intrinsic Amplitude: 3.875 mV
Lead Channel Sensing Intrinsic Amplitude: 3.875 mV
Lead Channel Setting Pacing Amplitude: 2 V
Lead Channel Setting Pacing Amplitude: 2.25 V
Lead Channel Setting Pacing Amplitude: 2.5 V
Lead Channel Setting Pacing Pulse Width: 0.4 ms
Lead Channel Setting Pacing Pulse Width: 0.4 ms
MDC IDC MSMT LEADCHNL LV IMPEDANCE VALUE: 399 Ohm
MDC IDC MSMT LEADCHNL RV PACING THRESHOLD AMPLITUDE: 0.75 V
MDC IDC SET LEADCHNL RV SENSING SENSITIVITY: 0.3 mV
MDC IDC STAT BRADY AP VP PERCENT: 10.49 %
MDC IDC STAT BRADY AS VS PERCENT: 9.2 %
Zone Setting Detection Interval: 240 ms
Zone Setting Detection Interval: 300 ms
Zone Setting Detection Interval: 350 ms
Zone Setting Detection Interval: 350 ms
Zone Setting Detection Interval: 450 ms

## 2013-09-22 NOTE — Progress Notes (Signed)
Remote ICD transmission.   

## 2013-09-26 ENCOUNTER — Telehealth: Payer: Self-pay | Admitting: Internal Medicine

## 2013-09-26 NOTE — Telephone Encounter (Signed)
C/D 09/26/13 for appt. 09/28/13

## 2013-09-26 NOTE — Telephone Encounter (Signed)
S/W PATIENT AND GAVE NP APPT FOR 08/12 @ 1:30 W/DR. CHISM.  REFERRING DR. Evette Cristal DX- EVALUATE RETROPERITONEAL LYMPHADENOPATHY

## 2013-09-28 ENCOUNTER — Telehealth: Payer: Self-pay | Admitting: Internal Medicine

## 2013-09-28 ENCOUNTER — Ambulatory Visit: Payer: Medicare Other

## 2013-09-28 ENCOUNTER — Encounter (INDEPENDENT_AMBULATORY_CARE_PROVIDER_SITE_OTHER): Payer: Self-pay

## 2013-09-28 ENCOUNTER — Ambulatory Visit (HOSPITAL_BASED_OUTPATIENT_CLINIC_OR_DEPARTMENT_OTHER): Payer: Medicare Other | Admitting: Internal Medicine

## 2013-09-28 ENCOUNTER — Encounter: Payer: Self-pay | Admitting: Internal Medicine

## 2013-09-28 ENCOUNTER — Other Ambulatory Visit: Payer: Self-pay | Admitting: Internal Medicine

## 2013-09-28 ENCOUNTER — Other Ambulatory Visit (HOSPITAL_BASED_OUTPATIENT_CLINIC_OR_DEPARTMENT_OTHER): Payer: Medicare Other

## 2013-09-28 VITALS — BP 149/84 | HR 71 | Temp 97.5°F | Resp 18 | Ht 73.0 in | Wt 224.1 lb

## 2013-09-28 DIAGNOSIS — I5022 Chronic systolic (congestive) heart failure: Secondary | ICD-10-CM

## 2013-09-28 DIAGNOSIS — J4489 Other specified chronic obstructive pulmonary disease: Secondary | ICD-10-CM

## 2013-09-28 DIAGNOSIS — R599 Enlarged lymph nodes, unspecified: Secondary | ICD-10-CM

## 2013-09-28 DIAGNOSIS — E039 Hypothyroidism, unspecified: Secondary | ICD-10-CM

## 2013-09-28 DIAGNOSIS — R590 Localized enlarged lymph nodes: Secondary | ICD-10-CM

## 2013-09-28 DIAGNOSIS — Z87891 Personal history of nicotine dependence: Secondary | ICD-10-CM

## 2013-09-28 DIAGNOSIS — J449 Chronic obstructive pulmonary disease, unspecified: Secondary | ICD-10-CM

## 2013-09-28 DIAGNOSIS — R591 Generalized enlarged lymph nodes: Secondary | ICD-10-CM | POA: Insufficient documentation

## 2013-09-28 LAB — CBC WITH DIFFERENTIAL/PLATELET
BASO%: 1.1 % (ref 0.0–2.0)
Basophils Absolute: 0.1 10*3/uL (ref 0.0–0.1)
EOS%: 1.5 % (ref 0.0–7.0)
Eosinophils Absolute: 0.1 10*3/uL (ref 0.0–0.5)
HCT: 35.9 % — ABNORMAL LOW (ref 38.4–49.9)
HEMOGLOBIN: 11.6 g/dL — AB (ref 13.0–17.1)
LYMPH#: 1.5 10*3/uL (ref 0.9–3.3)
LYMPH%: 23.1 % (ref 14.0–49.0)
MCH: 31.5 pg (ref 27.2–33.4)
MCHC: 32.2 g/dL (ref 32.0–36.0)
MCV: 97.8 fL (ref 79.3–98.0)
MONO#: 0.7 10*3/uL (ref 0.1–0.9)
MONO%: 10.9 % (ref 0.0–14.0)
NEUT#: 4 10*3/uL (ref 1.5–6.5)
NEUT%: 63.4 % (ref 39.0–75.0)
Platelets: 235 10*3/uL (ref 140–400)
RBC: 3.67 10*6/uL — AB (ref 4.20–5.82)
RDW: 15.5 % — AB (ref 11.0–14.6)
WBC: 6.3 10*3/uL (ref 4.0–10.3)

## 2013-09-28 LAB — COMPREHENSIVE METABOLIC PANEL (CC13)
ALT: 12 U/L (ref 0–55)
AST: 24 U/L (ref 5–34)
Albumin: 3 g/dL — ABNORMAL LOW (ref 3.5–5.0)
Alkaline Phosphatase: 72 U/L (ref 40–150)
Anion Gap: 10 mEq/L (ref 3–11)
BUN: 4.4 mg/dL — ABNORMAL LOW (ref 7.0–26.0)
CO2: 30 meq/L — AB (ref 22–29)
Calcium: 8.5 mg/dL (ref 8.4–10.4)
Chloride: 106 mEq/L (ref 98–109)
Creatinine: 0.9 mg/dL (ref 0.7–1.3)
Glucose: 92 mg/dl (ref 70–140)
Potassium: 3.4 mEq/L — ABNORMAL LOW (ref 3.5–5.1)
Sodium: 145 mEq/L (ref 136–145)
Total Bilirubin: 0.49 mg/dL (ref 0.20–1.20)
Total Protein: 6.3 g/dL — ABNORMAL LOW (ref 6.4–8.3)

## 2013-09-28 LAB — PROTIME-INR
INR: 1.1 — ABNORMAL LOW (ref 2.00–3.50)
Protime: 13.2 Seconds (ref 10.6–13.4)

## 2013-09-28 LAB — LACTATE DEHYDROGENASE (CC13): LDH: 202 U/L (ref 125–245)

## 2013-09-28 NOTE — Progress Notes (Signed)
Checked in new pt with no financial concerns. °

## 2013-09-28 NOTE — Telephone Encounter (Signed)
per pof to sch appt-sch-[facility] sch PET while pt was here-pt has cpy of sch times & dates

## 2013-09-28 NOTE — Progress Notes (Signed)
[facility] [phone]   [phone]  NEW PATIENT EVALUATION   Name: Don [name] Date: 09/29/2013 MRN: [id number] DOB: [birth year]  PCP: Gerrit Heck, MD   REFERRING PHYSICIAN: Randa Lynn*  REASON FOR REFERRAL: Lymphadenopathy NOS   HISTORY OF PRESENT ILLNESS:Don [name] is a 68 y.o. male who has a history of CHF (2001), defib/pacer, Graves Disease, COPD, history of TIAs and heart catheterization x 4 is here for a new patient appointment for further evaluation of lymphadenopathy NOS. He is being referred by Dr. Anson Fret of Gastroenterology.  He saw Dr. Penelope Coop on 07.09/2013 and then on 09/21/2013 due to progressive non-specific gastrointestinal complaints including nausea, gas and bloatiness and dry heaves.  He had an upper GI on 08/26/2013 which was unremarkable. Patient reportedly went to the ER with a lot these complaints and was found to dehydrated and give hydration.  He had a CT of abdomen which revealed a 2.4 cm non-inflamed soft tissue density in the right abdominal retroperitoneum suspicious for lymphadenopathy.   He saw Dr. Penelope Coop who spoke with interventional radiology and reportedly was unable to biopsy this area due to the location.  Other options would be to do a PET scan to rule out lymphoma and to examine for evidence of other areas of disease.  He had a flexible sigmoidoscopy because of diarrhea and bleeding and was found to have hemorrhoids.  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    He reported a few occasional night sweats over the past few months.  He denies weight changes or fevers or chills.  He reports being admitted for kidney failure in 04/2012.   He had fallen and hurt himself with rib fractures.  He was prescribed meloxicam and within hours, he reported feeling sick and was treated for acute kidney failure.  He reports an extensive drinking with as much as a fifth of vodka daily.  He  reports starting to drink following her wife's death.  She died of multiple myeloma.  He quit 3 weeks ago.  He also notes an extensive smoking history as well.  He smoked as much as two packs daily for the past thirty years. He follows with cardiology with Dr. Stephens Shire of [facility];  And EP with Dr. Lovena Le.  He reports shortness of breath with moderate exertion.  He reports one episode of hemoptysis about a week ago.  He reports occasional blood on the tissue paper.    Today, he reports with his daughter.   PAST MEDICAL HISTORY:  has a past medical history of Nonischemic cardiomyopathy; Chronic systolic heart failure; LBBB (left bundle branch block); Alcohol abuse; History of angina; Stroke; Thyroid disease; ICD (implantable cardiac defibrillator) in place; CHF (congestive heart failure); Anginal pain; Chronic kidney disease; Arthritis; Coronary artery disease; and TIA (transient ischemic attack).     PAST SURGICAL HISTORY: Past Surgical History  Procedure Laterality Date  . Insert / replace / remove pacemaker      BI-V IMPLANTABLE CARDIOVERTER-DEFIBRILLATOR. ; [employer]TRONIC CONCERTO; MODEL  # [id number], SERIAL # [id number] H.  DR. Desiree Lucy EDMUNDS.  Marland Kitchen Cardiac catheterization    . Tonsillectomy    . Cystoscopy w/ stone manipulation    . Vasectomy    . Flexible sigmoidoscopy N/A 04/29/2012    Procedure: FLEXIBLE SIGMOIDOSCOPY;  Surgeon: Winfield Cunas., MD;  Location: [facility] ENDOSCOPY;  Service: Endoscopy;  Laterality: N/A;     CURRENT MEDICATIONS: has a current medication list which includes the  following prescription(s): allopurinol, aspirin, atorvastatin, benazepril, carvedilol, dexlansoprazole, furosemide, levothyroxine, metoclopramide, nitroglycerin, OVER THE COUNTER MEDICATION, and promethazine.   ALLERGIES: Sulfa antibiotics   SOCIAL HISTORY:  reports that he quit smoking about 19 months ago. He has never used smokeless tobacco. He reports that he does not drink alcohol or use  illicit drugs.   FAMILY HISTORY: family history includes Cancer in an other family member; Heart disease in his father and mother; Hypertension in an other family member.   LABORATORY DATA:  Results for orders placed in visit on 09/28/13 (from the past 48 hour(s))  CBC WITH DIFFERENTIAL     Status: Abnormal   Collection Time    09/28/13  1:39 PM      Result Value Ref Range   WBC 6.3  4.0 - 10.3 10e3/uL   NEUT# 4.0  1.5 - 6.5 10e3/uL   HGB 11.6 (*) 13.0 - 17.1 g/dL   HCT 35.9 (*) 38.4 - 49.9 %   Platelets 235  140 - 400 10e3/uL   MCV 97.8  79.3 - 98.0 fL   MCH 31.5  27.2 - 33.4 pg   MCHC 32.2  32.0 - 36.0 g/dL   RBC 3.67 (*) 4.20 - 5.82 10e6/uL   RDW 15.5 (*) 11.0 - 14.6 %   lymph# 1.5  0.9 - 3.3 10e3/uL   MONO# 0.7  0.1 - 0.9 10e3/uL   Eosinophils Absolute 0.1  0.0 - 0.5 10e3/uL   Basophils Absolute 0.1  0.0 - 0.1 10e3/uL   NEUT% 63.4  39.0 - 75.0 %   LYMPH% 23.1  14.0 - 49.0 %   MONO% 10.9  0.0 - 14.0 %   EOS% 1.5  0.0 - 7.0 %   BASO% 1.1  0.0 - 2.0 %  PROTIME-INR     Status: Abnormal   Collection Time    09/28/13  1:39 PM      Result Value Ref Range   Protime 13.2  10.6 - 13.4 Seconds   INR 1.10 (*) 2.00 - 3.50   Comment: INR is useful only to assess adequacy of anticoagulation with coumadin when comparing results from different labs. It should not be used to estimate bleeding risk or presence/abscense of coagulopathy in patients not on coumadin. Expected INR ranges for      nontherapeutic patients is 0.88 - 1.12.   Lovenox No    SEDIMENTATION RATE     Status: Abnormal   Collection Time    09/28/13  1:40 PM      Result Value Ref Range   Sed Rate 28 (*) 0 - 16 mm/hr  LACTATE DEHYDROGENASE (CC13)     Status: None   Collection Time    09/28/13  1:40 PM      Result Value Ref Range   LDH 202  125 - 245 U/L  COMPREHENSIVE METABOLIC PANEL (JF35)     Status: Abnormal   Collection Time    09/28/13  1:40 PM      Result Value Ref Range   Sodium 145  136 - 145 mEq/L    Potassium 3.4 (*) 3.5 - 5.1 mEq/L   Chloride 106  98 - 109 mEq/L   CO2 30 (*) 22 - 29 mEq/L   Glucose 92  70 - 140 mg/dl   BUN 4.4 (*) 7.0 - 26.0 mg/dL   Creatinine 0.9  0.7 - 1.3 mg/dL   Total Bilirubin 0.49  0.20 - 1.20 mg/dL   Alkaline Phosphatase 72  40 - 150 U/L  AST 24  5 - 34 U/L   ALT 12  0 - 55 U/L   Total Protein 6.3 (*) 6.4 - 8.3 g/dL   Albumin 3.0 (*) 3.5 - 5.0 g/dL   Calcium 8.5  8.4 - 10.4 mg/dL   Anion Gap 10  3 - 11 mEq/L       RADIOGRAPHY: 08/22/2013 CT ABDOMEN AND PELVIS WITH CONTRAST  TECHNIQUE:  Multidetector CT imaging of the abdomen and pelvis was performed using the standard protocol following bolus administration of intravenous contrast. CONTRAST: 128m OMNIPAQUE IOHEXOL 300 MG/ML SOLN COMPARISON: None. FINDINGS: Small cyst noted in the left hepatic lobe, however no liver masses are identified. The gallbladder, pancreas, spleen, adrenal glands, and kidneys are normal in appearance. No evidence of hydronephrosis. No pelvic soft tissue masses identified. A 2.4 cm soft tissue density is seen in the right abdominal retroperitoneum, suspicious for a retroperitoneal lymph node. Shotty less than 1 cm lymph nodes are seen in the gastrohepatic ligament and surrounding the celiac axis, however no other pathologically  enlarged nodes are identified within the abdomen or pelvis.  Normal appendix is visualized. No evidence of inflammatory process or abnormal fluid collections. No evidence of bowel wall thickening or dilatation. IMPRESSION: 2.4 cm soft tissue density in the right abdominal retroperitoneum,  suspicious for lymphadenopathy. No other soft tissue masses or lymphadenopathy identified. Consider CT-guided percutaneous needle biopsy.    REVIEW OF SYSTEMS:  Constitutional: Denies fevers, chills or abnormal weight loss Eyes: Denies blurriness of vision Ears, nose, mouth, throat, and face: Denies mucositis or sore throat Respiratory: Denies cough, dyspnea or  wheezes Cardiovascular: Denies palpitation, chest discomfort or lower extremity swelling Gastrointestinal:  Denies nausea, heartburn or change in bowel habits Skin: Denies abnormal skin rashes Lymphatics: Denies new lymphadenopathy or easy bruising Neurological:Denies numbness, tingling or new weaknesses Behavioral/Psych: Mood is stable, no new changes  All other systems were reviewed with the patient and are negative.  PHYSICAL EXAM:  height is '6\' 1"'  (1.854 m) and weight is 224 lb 1.6 oz (101.651 kg). His oral temperature is 97.5 F (36.4 C). His blood pressure is 149/84 and his pulse is 71. His respiration is 18 and oxygen saturation is 99%.    GENERAL:alert, no distress and comfortable; well developed and well nourished.  SKIN: skin color, texture, turgor are normal, no rashes or significant lesions EYES: normal, Conjunctiva are pink and non-injected, sclera clear OROPHARYNX:no exudate, no erythema and lips, buccal mucosa, and tongue normal  NECK: supple, thyroid normal size, non-tender, without nodularity LYMPH:  no palpable lymphadenopathy in the cervical, axillary or inguinal LUNGS: clear to auscultation and percussion with normal breathing effort HEART: regular rate & rhythm and no murmurs and no lower extremity edema ABDOMEN:abdomen soft, non-tender and normal bowel sounds Musculoskeletal:no cyanosis of digits and no clubbing  NEURO: alert & oriented x 3 with fluent speech, no focal motor/sensory deficits   IMPRESSION: CMOLLY MASELLIis a 68y.o. male with a history of    PLAN:  1.  Lymphadenopathy NOS. --We reviewed extensively with patient and his daughter, his history and imaging concerning for 2.4 non-inflamed soft tissue density in the right abdominal retroperitoneum suspicious for lymphadenopathy.  He denied fevers or weight changes but does endorse intermittent drenching night sweats.  This is concerning for a lymphoma.  We therefore recommended to obtain a PET to  evaluate for extent of disease versus avidity in the right abdominal retroperitoneum.  This might enable easier excisional biopsy to if PET positive.  Patient was agreeable with this plan.  If PET positive, we will then refer for an excisional biopsy by IR.   His LDH is normal; ESR is elevated.   His physical exam did not reveal additional areas of lymphadenopathy.   2. History of CHF (2001) s/p defibrillator/PPM, catheterization x 4.  -- Continue Coreg 6.25 mg daily, benazepril 10 mg daily, aspirin 81 mg daily and lasix 20 mg daily.   3. COPD/ former Tobacco abuse.  --He stopped smoking nearly one month ago and declines tobacco cessation classes.   4. Graves s/p treatment now hypothyroidism --Continue synthroid 100 mcg daily.   5. Former alcohol abuse.  --Patient was counseled to abstain from alcohol use.  He did have a history of an elevated AST.    6. Follow up. --Patient will have a PET Scan as noted above and follow up in 2 weeks to discuss the results.   All questions were answered. The patient knows to call the clinic with any problems, questions or concerns. We can certainly see the patient much sooner if necessary.  I spent 40 minutes counseling the patient face to face. The total time spent in the appointment was 60 minutes.    Audrey Thull, MD 09/29/2013 2:26 PM

## 2013-09-28 NOTE — Patient Instructions (Signed)
Positron Emission Tomography (PET Scan) PET stands for positron emission tomography. This is a test similar to an X-ray. Pictures can be taken of a body part after injection of a very small dose of a chemical called a radionuclide. This is combined with sugar, water, or ammonia to give off tiny particles called positrons. The positrons emitted are like small bursts of energy that can be detected by a scanner. They are processed by a computer to create images. These images can be used to study different diseases. They are often used to study cancer and cancer therapy. A scan of the entire body can be done and used to study all its parts. Because this test is tagged to a sugar used by cells, the bursts of energy show up differently in cells that use sugar faster. The computer is able to produce a color-coded picture based on this. The colors and amount of brightness on a PET image show different levels of tissue or organ function. For example, a cancer grows faster than healthy tissue and uses more sugar than normal tissue. It will absorb more of the substance injected. This causes it to appear brighter than normal tissue on the PET image. A specialist will read and explain the images. Other examinations, such as recent CT (or CAT) scans or MRI scans may help with interpretation and should be brought along. There are usually no restrictions after the test. You should drink plenty of fluids to flush the radioactive substance from your body. BEFORE THE PROCEDURE   PET is usually an outpatient procedure. Wear comfortable, loose-fitting clothes.  Do not eat for four hours before the scan. You will be encouraged to drink water.  Your caregiver will instruct you regarding the use of medications before the test.  Note: Diabetic patients should ask for any specific diet guidelines to control glucose (sugar) levels during the day of the test. There are limitations with the test if your blood sugar is not controlled  during or before the test.  Be on time because of the rapid decay of the radioactive material that must be injected. PROCEDURE  Before the procedure begins a small amount of harmless radioactive material will be injected into a vein. This means you will have a needle stick. It will take from 30 minutes to one hour for the material to travel around your body in preparation for the scan. You will lie on a cushioned table and be moved through the center of a machine that looks like a large doughnut. This is the machine that detects the positrons. It is connected to a computer that produces images that can be viewed on a monitor. This will take about 30 minutes to an hour, during which you must remain still. Let your caregiver know if this will be difficult for you. Also, let your caregiver know if you need a sedative or help dealing with claustrophobia (feeling uncomfortable in enclosed spaces). HOME CARE INSTRUCTIONS   For the protection of your privacy, test results can not be given over the phone. Make sure you receive the results of your test. Ask as to how these results are to be obtained if you have not been informed. It is your responsibility to obtain your test results.  Drink several 8-once glasses of water following the test to flush the small amount of radioactive material out of your body.  Keep your follow-up appointments. Document Released: 08/10/2002 Document Revised: 04/28/2011 Document Reviewed: 05/18/2013 [facility] Patient Information 2015 [place], [place]. This information  is not intended to replace advice given to you by your health care provider. Make sure you discuss any questions you have with your health care provider. Lymphadenopathy Lymphadenopathy means "disease of the lymph glands." But the term is usually used to describe swollen or enlarged lymph glands, also called lymph nodes. These are the bean-shaped organs found in many locations including the neck, underarm, and groin.  Lymph glands are part of the immune system, which fights infections in your body. Lymphadenopathy can occur in just one area of the body, such as the neck, or it can be generalized, with lymph node enlargement in several areas. The nodes found in the neck are the most common sites of lymphadenopathy. CAUSES When your immune system responds to germs (such as viruses or bacteria ), infection-fighting cells and fluid build up. This causes the glands to grow in size. Usually, this is not something to worry about. Sometimes, the glands themselves can become infected and inflamed. This is called lymphadenitis. Enlarged lymph nodes can be caused by many diseases:  Bacterial disease, such as strep throat or a skin infection.  Viral disease, such as a common cold.  Other germs, such as Lyme disease, tuberculosis, or sexually transmitted diseases.  Cancers, such as lymphoma (cancer of the lymphatic system) or leukemia (cancer of the white blood cells).  Inflammatory diseases such as lupus or rheumatoid arthritis.  Reactions to medications. Many of the diseases above are rare, but important. This is why you should see your caregiver if you have lymphadenopathy. SYMPTOMS  Swollen, enlarged lumps in the neck, back of the head, or other locations.  Tenderness.  Warmth or redness of the skin over the lymph nodes.  Fever. DIAGNOSIS Enlarged lymph nodes are often near the source of infection. They can help health care providers diagnose your illness. For instance:  Swollen lymph nodes around the jaw might be caused by an infection in the mouth.  Enlarged glands in the neck often signal a throat infection.  Lymph nodes that are swollen in more than one area often indicate an illness caused by a virus. Your caregiver will likely know what is causing your lymphadenopathy after listening to your history and examining you. Blood tests, x-rays, or other tests may be needed. If the cause of the enlarged  lymph node cannot be found, and it does not go away by itself, then a biopsy may be needed. Your caregiver will discuss this with you. TREATMENT Treatment for your enlarged lymph nodes will depend on the cause. Many times the nodes will shrink to normal size by themselves, with no treatment. Antibiotics or other medicines may be needed for infection. Only take over-the-counter or prescription medicines for pain, discomfort, or fever as directed by your caregiver. HOME CARE INSTRUCTIONS Swollen lymph glands usually return to normal when the underlying medical condition goes away. If they persist, contact your health-care provider. He/she might prescribe antibiotics or other treatments, depending on the diagnosis. Take any medications exactly as prescribed. Keep any follow-up appointments made to check on the condition of your enlarged nodes. SEEK MEDICAL CARE IF:  Swelling lasts for more than two weeks.  You have symptoms such as weight loss, night sweats, fatigue, or fever that does not go away.  The lymph nodes are hard, seem fixed to the skin, or are growing rapidly.  Skin over the lymph nodes is red and inflamed. This could mean there is an infection. SEEK IMMEDIATE MEDICAL CARE IF:  Fluid starts leaking from the area  of the enlarged lymph node.  You develop a fever of 102 F (38.9 C) or greater.  Severe pain develops (not necessarily at the site of a large lymph node).  You develop chest pain or shortness of breath.  You develop worsening abdominal pain. MAKE SURE YOU:  Understand these instructions.  Will watch your condition.  Will get help right away if you are not doing well or get worse. Document Released: 11/13/2007 Document Revised: 06/20/2013 Document Reviewed: 11/13/2007 [facility] Patient Information 2015 [place], [place]. This information is not intended to replace advice given to you by your health care provider. Make sure you discuss any questions you have with your  health care provider.

## 2013-09-29 LAB — SEDIMENTATION RATE: SED RATE: 28 mm/h — AB (ref 0–16)

## 2013-09-30 ENCOUNTER — Encounter: Payer: Self-pay | Admitting: Cardiology

## 2013-10-05 ENCOUNTER — Ambulatory Visit (HOSPITAL_COMMUNITY)
Admission: RE | Admit: 2013-10-05 | Discharge: 2013-10-05 | Disposition: A | Discharge status: Home / Self Care | Payer: Medicare Other | Source: Ambulatory Visit | Attending: Internal Medicine | Admitting: Internal Medicine

## 2013-10-05 DIAGNOSIS — R599 Enlarged lymph nodes, unspecified: Secondary | ICD-10-CM | POA: Diagnosis not present

## 2013-10-05 DIAGNOSIS — R591 Generalized enlarged lymph nodes: Secondary | ICD-10-CM

## 2013-10-05 DIAGNOSIS — J90 Pleural effusion, not elsewhere classified: Secondary | ICD-10-CM | POA: Insufficient documentation

## 2013-10-05 LAB — GLUCOSE, CAPILLARY: Glucose-Capillary: 89 mg/dL (ref 70–99)

## 2013-10-05 MED ORDER — FLUDEOXYGLUCOSE F - 18 (FDG) INJECTION
11.2000 | Freq: Once | INTRAVENOUS | Status: AC | PRN
  Administered 2013-10-05: 11.2 via INTRAVENOUS

## 2013-10-13 ENCOUNTER — Telehealth: Payer: Self-pay | Admitting: Internal Medicine

## 2013-10-13 ENCOUNTER — Ambulatory Visit (HOSPITAL_BASED_OUTPATIENT_CLINIC_OR_DEPARTMENT_OTHER): Payer: Medicare Other | Admitting: Internal Medicine

## 2013-10-13 VITALS — BP 133/84 | HR 59 | Temp 98.0°F | Resp 18 | Ht 73.0 in | Wt 228.6 lb

## 2013-10-13 DIAGNOSIS — E038 Other specified hypothyroidism: Secondary | ICD-10-CM

## 2013-10-13 DIAGNOSIS — R591 Generalized enlarged lymph nodes: Secondary | ICD-10-CM

## 2013-10-13 DIAGNOSIS — Z87891 Personal history of nicotine dependence: Secondary | ICD-10-CM

## 2013-10-13 DIAGNOSIS — R599 Enlarged lymph nodes, unspecified: Secondary | ICD-10-CM

## 2013-10-13 DIAGNOSIS — I509 Heart failure, unspecified: Secondary | ICD-10-CM

## 2013-10-13 NOTE — Progress Notes (Signed)
[place], [place] [address]  DIAGNOSIS: Lymphadenopathy - Plan: CBC with Differential, Basic metabolic panel (Bmet) - [facility], Lactate dehydrogenase (LDH) - [facility], CT Abdomen Pelvis W Contrast  Chief Complaint  Patient presents with  . Lymphadenopathy    CURRENT TREATMENT: Observation.  INTERVAL HISTORY: Don [name] 68 y.o. male who has a history of CHF (2001), defib/pacer, Graves Disease, COPD, history of TIAs and heart catheterization x 4 is here for follow up appointment for further evaluation of lymphadenopathy NOS. He was seen by me on 09/28/2013.   He was referred by Dr. Anson Fret of Gastroenterology. He saw Dr. Penelope Coop on 08/24/2013 and then on 09/21/2013 due to progressive non-specific gastrointestinal complaints including nausea, gas and bloatiness and dry heaves. He had an upper GI on 08/26/2013 which was unremarkable. Patient reportedly went to the ER with a lot these complaints and was found to dehydrated and give hydration. He had a CT of abdomen which revealed a 2.4 cm non-inflamed soft tissue density in the right abdominal retroperitoneum suspicious for lymphadenopathy. He saw Dr. Penelope Coop who spoke with interventional radiology and reportedly was unable to biopsy this area due to the location. Other options would be to do a PET scan to rule out lymphoma and to examine for evidence of other areas of disease. He had a flexible sigmoidoscopy because of diarrhea and bleeding and was found to have hemorrhoids. 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   He reported a few occasional night sweats over the past few months. He denied weight changes or fevers or chills. He reported being admitted for kidney failure in 04/2012. He had fallen and hurt himself with rib fractures. He was prescribed meloxicam and within hours, he reported feeling sick and was treated for acute kidney failure. He  reported an extensive drinking with as much as a fifth of vodka daily. He reported starting to drink following her wife's death. She died of multiple myeloma. He quit 5 weeks ago. He also noted an extensive smoking history as well. He smoked as much as two packs daily for the past thirty years. He followed with cardiology with Dr. Stephens Shire of [facility] and EP with Dr. Lovena Le. He reported shortness of breath with moderate exertion. He reported one episode of hemoptysis about a week ago. He reported occasional blood on the tissue paper. He had an interim PET scan to further evaluate for lymphoma and here to discuss the results.    MEDICAL HISTORY: Past Medical History  Diagnosis Date  . Nonischemic cardiomyopathy     EF 20% s/p INSERTION OF BI-V DEFIBRILLATOR.  . Chronic systolic heart failure     LEFT  . LBBB (left bundle branch block)   . Alcohol abuse     PREVIOUS H/O DRINKING A FIFTH OF VODKA  A DAY  . History of angina   . Stroke     TIA's  . Thyroid disease     HYPOTHYROIDISM.....TREATED  . ICD (implantable cardiac defibrillator) in place   . CHF (congestive heart failure)   . Anginal pain   . Chronic kidney disease     acute renal  04/2012  . Arthritis     hands & back  . Coronary artery disease   . TIA (transient ischemic attack)     INTERIM HISTORY: has ICD-[employer]; Chronic systolic heart failure; Ventricular tachycardia; Acute renal failure; Hypovolemic shock; Alcohol abuse; Stroke; Thyroid disease; Rectal bleeding;  Diarrhea; Hypokalemia; Right rib fracture; Fall; Dehydration; Hemorrhoids with complication; TIA (transient ischemic attack); and Lymphadenopathy on his problem list.    ALLERGIES:  is allergic to sulfa antibiotics.  MEDICATIONS: has a current medication list which includes the following prescription(s): allopurinol, aspirin, atorvastatin, benazepril, carvedilol, doxycycline, furosemide, levothyroxine, metoclopramide, nitroglycerin, OVER THE COUNTER  MEDICATION, potassium chloride, and promethazine.  SURGICAL HISTORY:  Past Surgical History  Procedure Laterality Date  . Insert / replace / remove pacemaker      BI-V IMPLANTABLE CARDIOVERTER-DEFIBRILLATOR. ; [employer] CONCERTO; MODEL  # [id number], SERIAL # [id number] H.  DR. Desiree Lucy EDMUNDS.  Marland Kitchen Cardiac catheterization    . Tonsillectomy    . Cystoscopy w/ stone manipulation    . Vasectomy    . Flexible sigmoidoscopy N/A 04/29/2012    Procedure: FLEXIBLE SIGMOIDOSCOPY;  Surgeon: Winfield Cunas., MD;  Location: [facility] ENDOSCOPY;  Service: Endoscopy;  Laterality: N/A;    REVIEW OF SYSTEMS:   Constitutional: Denies fevers, chills or abnormal weight loss Eyes: Denies blurriness of vision Ears, nose, mouth, throat, and face: Denies mucositis or sore throat Respiratory: Denies cough, dyspnea or wheezes Cardiovascular: Denies palpitation, chest discomfort or lower extremity swelling Gastrointestinal:  Denies nausea, heartburn or change in bowel habits Skin: Denies abnormal skin rashes Lymphatics: Denies new lymphadenopathy or easy bruising Neurological:Denies numbness, tingling or new weaknesses Behavioral/Psych: Mood is stable, no new changes  All other systems were reviewed with the patient and are negative.  PHYSICAL EXAMINATION: ECOG PERFORMANCE STATUS: 0 - Asymptomatic  Blood pressure 133/84, pulse 59, temperature 98 F (36.7 C), temperature source Oral, resp. rate 18, height _0  (1.854 m), weight 228 lb 9.6 oz (103.692 kg).  GENERAL:alert, no distress and comfortable; well developed and well nourished; anxious. SKIN: skin color, texture, turgor are normal, no rashes or significant lesions EYES: normal, Conjunctiva are pink and non-injected, sclera clear OROPHARYNX:no exudate, no erythema and lips, buccal mucosa, and tongue normal  NECK: supple, thyroid normal size, non-tender, without nodularity LYMPH:  no palpable lymphadenopathy in the cervical, axillary or  supraclavicular LUNGS: clear to auscultation with normal breathing effort, no wheezes or rhonchi HEART: regular rate & rhythm and no murmurs and no lower extremity edema ABDOMEN:abdomen soft, non-tender and normal bowel sounds Musculoskeletal:no cyanosis of digits and no clubbing  NEURO: alert & oriented x 3 with fluent speech, no focal motor/sensory deficits  Labs:  Lab Results  Component Value Date   WBC 6.3 09/28/2013   HGB 11.6* 09/28/2013   HCT 35.9* 09/28/2013   MCV 97.8 09/28/2013   PLT 235 09/28/2013   NEUTROABS 4.0 09/28/2013      Chemistry      Component Value Date/Time   NA 145 09/28/2013 1340   NA 143 08/22/2013 1535   K 3.4* 09/28/2013 1340   K 4.1 08/22/2013 1535   CL 99 08/22/2013 1535   CO2 30* 09/28/2013 1340   CO2 28 08/22/2013 1535   BUN 4.4* 09/28/2013 1340   BUN 19 08/22/2013 1535   CREATININE 0.9 09/28/2013 1340   CREATININE 0.91 08/22/2013 1535      Component Value Date/Time   CALCIUM 8.5 09/28/2013 1340   CALCIUM 8.6 08/22/2013 1535   ALKPHOS 72 09/28/2013 1340   ALKPHOS 92 08/22/2013 1535   AST 24 09/28/2013 1340   AST 68* 08/22/2013 1535   ALT 12 09/28/2013 1340   ALT 20 08/22/2013 1535   BILITOT 0.49 09/28/2013 1340   BILITOT 1.6* 08/22/2013 1535       Basic  Metabolic Panel: No results found for this basename: NA, K, CL, CO2, GLUCOSE, BUN, CREATININE, CALCIUM, MG, PHOS,  in the last 168 hours GFR Estimated Creatinine Clearance: 100.7 ml/min (by C-G formula based on Cr of 0.9). Liver Function Tests: No results found for this basename: AST, ALT, ALKPHOS, BILITOT, PROT, ALBUMIN,  in the last 168 hours No results found for this basename: LIPASE, AMYLASE,  in the last 168 hours No results found for this basename: AMMONIA,  in the last 168 hours Coagulation profile No results found for this basename: INR, PROTIME,  in the last 168 hours  CBC: No results found for this basename: WBC, NEUTROABS, HGB, HCT, MCV, PLT,  in the last 168 hours  Anemia work up No results found  for this basename: VITAMINB12, FOLATE, FERRITIN, TIBC, IRON, RETICCTPCT,  in the last 72 hours  Studies:  No results found.   RADIOGRAPHIC STUDIES: Nm Pet Image Initial (pi) Skull Base To Thigh  10/05/2013   CLINICAL DATA:  Initial treatment strategy for right-sided abdominal adenopathy. Abdominal tenderness and weight loss. Night sweats. Alcohol abuse.  EXAM: NUCLEAR MEDICINE PET SKULL BASE TO THIGH  TECHNIQUE: 11.2 mCi F-18 FDG was injected intravenously. Full-ring PET imaging was performed from the skull base to thigh after the radiotracer. CT data was obtained and used for attenuation correction and anatomic localization.  FASTING BLOOD GLUCOSE:  Value: 89 mg/dl  COMPARISON:  CT abdomen pelvis 08/22/2013. Chest radiograph of 08/22/2013.  FINDINGS: NECK  No areas of abnormal hypermetabolism.  CHEST  No areas of abnormal hypermetabolism.  ABDOMEN/PELVIS  No areas of abnormal hypermetabolism. The nodule position medial to the right kidney is not hypermetabolic. Measures 2.2 x 1.7 cm on image 125 versus 2.4 x 1.8 cm on the prior exam. Felt to be similar. Fluid density prior to contrast.  SKELETON  Anterior right second and third healing rib fractures with hypermetabolism.  CT IMAGES PERFORMED FOR ATTENUATION CORRECTION  Mucous retention cysts or polyps in both maxillary sinuses.  Pacer and AICD device.  Mild cardiomegaly without pericardial effusion. Multivessel coronary artery atherosclerosis. Small bilateral pleural effusions. Left base dependent atelectasis. Small bilateral pleural effusions. New on the right and increased or new on the left.  Left hepatic lobe cyst. Prominent caudate and lateral segment left liver lobes.  Aortic and branch vessel atherosclerosis. Scatter pelvic bone islands.  IMPRESSION: 1. The right retroperitoneal/perirenal lesion is not significantly hypermetabolic, and is low-density, likely cystic. Considerations include a GI duplication cyst arising from the adjacent descending  duodenum, lymphangioma, or an atypical appearance of a likely benign lymph node. This is stable in size over 6 weeks. Consider followup with CT at 3 months. 2. No evidence of hypermetabolic  adenopathy to suggest lymphoma. 3. Suspicion of mild cirrhosis,. 4. Small left greater than right pleural effusions, new/enlarged.   Electronically Signed   By: Abigail Miyamoto M.D.   On: 10/05/2013 15:01    ASSESSMENT: Don [name] 68 y.o. male with a history of Lymphadenopathy - Plan: CBC with Differential, Basic metabolic panel (Bmet) - [facility], Lactate dehydrogenase (LDH) - [facility], CT Abdomen Pelvis W Contrast   PLAN:  1. Lymphadenopathy NOS, likely cystic.  --We reviewed extensively with patient the results of the above PET which states the the soft tissue density is likely a cyst or a benign lymph node.  It was stable in size over the past 6 weeks and radiology recommended a CT at 3 months to demonstrate stability.  There was no evidence of hypermetabolic  adenopathy to suggest lymphoma.  Clinically, he denies fevers or weight changes but does endorse intermittent drenching night sweats.  Patient was agreeable with this plan. His LDH is normal; ESR is elevated. His physical exam did not reveal additional areas of lymphadenopathy.   2. History of CHF (2001) s/p defibrillator/PPM, catheterization x 4.  -- Continue Coreg 6.25 mg daily, benazepril 10 mg daily, aspirin 81 mg daily and lasix 20 mg daily. Pleural effusions noted and patient reports eating cheetos and other salt indiscretion.  We recommended that he call his cardiologist for consideration of increased lasix and to stop salty foods.   3. COPD/ former Tobacco abuse.  --He stopped smoking nearly one month ago and declines tobacco cessation classes.   4. Graves s/p treatment now hypothyroidism  --Continue synthroid 100 mcg daily.   5. Former alcohol abuse.  --Patient was counseled to abstain from alcohol use. He did have a history of an elevated  AST. He does have mild cirrhosis noted on the PET.   6. Follow up.  --Patient will have a CT of abdomen and pelvis in 3 months and a symptom follow up visit with labs including CBC, BMP and LDH.    All questions were answered. The patient knows to call the clinic with any problems, questions or concerns. We can certainly see the patient much sooner if necessary.  I spent 15 minutes counseling the patient face to face. The total time spent in the appointment was 25 minutes.    Dewey Viens, MD 10/13/2013 9:49 AM

## 2013-10-13 NOTE — Telephone Encounter (Signed)
gv and printed appt sched and avs for pt for NOV...gv pt barium  °

## 2013-12-09 ENCOUNTER — Ambulatory Visit: Payer: Medicare Other | Admitting: Internal Medicine

## 2014-01-05 ENCOUNTER — Ambulatory Visit (HOSPITAL_COMMUNITY): Payer: Medicare Other

## 2014-01-05 ENCOUNTER — Other Ambulatory Visit: Payer: Medicare Other

## 2014-01-09 ENCOUNTER — Telehealth: Payer: Self-pay | Admitting: Hematology

## 2014-01-09 NOTE — Telephone Encounter (Signed)
transferring pt fron CP1 to [facility]. due to pt r/s lb/ct from 11/19 to 12/29 f/u moved from 11/30 to 12/31. lmonvm for pt and mailed new schedule.

## 2014-01-09 NOTE — Telephone Encounter (Signed)
Confirm appt d/t for Dec. mailed cal °

## 2014-01-16 ENCOUNTER — Ambulatory Visit: Payer: Medicare Other | Admitting: Hematology

## 2014-01-26 ENCOUNTER — Encounter (HOSPITAL_COMMUNITY): Payer: Self-pay | Admitting: Internal Medicine

## 2014-01-31 ENCOUNTER — Encounter: Payer: Medicare Other | Admitting: Internal Medicine

## 2014-02-13 ENCOUNTER — Ambulatory Visit (INDEPENDENT_AMBULATORY_CARE_PROVIDER_SITE_OTHER): Payer: Medicare Other | Admitting: Internal Medicine

## 2014-02-13 ENCOUNTER — Encounter: Payer: Self-pay | Admitting: Internal Medicine

## 2014-02-13 VITALS — BP 142/80 | HR 75 | Temp 98.0°F | Resp 12 | Ht 71.0 in | Wt 218.8 lb

## 2014-02-13 DIAGNOSIS — E559 Vitamin D deficiency, unspecified: Secondary | ICD-10-CM

## 2014-02-13 DIAGNOSIS — E890 Postprocedural hypothyroidism: Secondary | ICD-10-CM

## 2014-02-13 LAB — MAGNESIUM: Magnesium: 1.6 mg/dL (ref 1.5–2.5)

## 2014-02-13 NOTE — Patient Instructions (Addendum)
Please stop at the lab.  Please take the thyroid hormone every day, with water, >30 minutes before breakfast, separated by >4 hours from acid reflux medications, calcium, iron, multivitamins.  Please come back for thyroid labs in 5-6 weeks.  Please come back for a follow-up appointment in 4 months.

## 2014-02-13 NOTE — Progress Notes (Signed)
Patient ID: Don [name], male   DOB: January 10, 1946, 68 y.o.   MRN: [id number]   HPI  Don [name] is a 68 y.o.-year-old male, referred by his PCP, Dr. 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  Pt. has been dx with Graves ds. In 10/1990 >> RAI tx in 1992 >> hypothyroidism >> started on Synthroid, now Levothyroxine 100 mcg, taken: - fasting - with water, coffee - separated by >30 min from b'fast  - started calcium 500 mg 2x a day started 11/2013 - no iron, PPIs, multivitamins   I reviewed pt's thyroid tests: Lab Results  Component Value Date   TSH 0.035* 04/27/2012   TSH 3.263 08/10/2006   FREET4 1.64 04/27/2012    Pt describes: - no weight gain - + fatigue - no cold intolerance - + depression - + constipation and diarrhea - no dry skin - no hair loss  Pt denies feeling nodules in neck, + hoarseness, no dysphagia/odynophagia, SOB with lying down.  She has + FH of thyroid disorders >> hypothyroidism in mother. No FH of thyroid cancer. Mother with Addison disease. No h/o radiation tx to head or neck other than RAI Tx in 1992. No recent use of iodine supplements.  Hypocalcemia: - in the setting of vitamin D deficiency: Vit D was 15.7 in 10/11/2013 - He was started on Ergocalciferol 50,000 every day (! - misunderstood instr.) x 4 days, then he started to take it weekly x 8 weeks >> he was then started on vit D OTC 4000 IU - Mg was also low, at 1.7 (1.6-2.6) in 09/2013 - after an episode of diarrhea for few weeks - has had perioral numbness, not now - has had cramps in hands, not now  Reviewed prev. Calcium levels: 11/23/2013: Ca 8.1 (8.6-10.3); normal BUN/Cr 10/28/2013: Ca 8.8, iCa 4.9 (4.5-5.6) 10/10/2013: iCa 4.7; vit D 15.7 (30-100); PTH 41 (15-65); Ca 8.4 (8.6-10.2); Mg 1.7 (1.6-2.6) Lab Results  Component Value Date   CALCIUM 8.5 09/28/2013   CALCIUM 8.6 08/22/2013   CALCIUM 8.2* 06/13/2013    CALCIUM 8.1* 08/17/2012   CALCIUM 8.4 08/15/2012   CALCIUM 8.4 05/01/2012   CALCIUM 8.3* 04/30/2012   CALCIUM 8.3* 04/29/2012   CALCIUM 8.1* 04/28/2012   CALCIUM 7.9* 04/28/2012   He has a h/o 5 episodes of kidney stones - 1970s-1999. His brother and father also had kidney stones.  I reviewed his chart and he also has a history of CHF, s/p ICD placement. Also, HTN, HL, h/o TIA.  ROS: Constitutional: + see HPi, + poor sleep Eyes: + blurry vision, no xerophthalmia ENT: + sore throat, no nodules palpated in throat, no dysphagia/odynophagia, no hoarseness Cardiovascular: no CP/+SOB/no palpitations/+ leg swelling Respiratory: no cough/+SOB Gastrointestinal: no N/V/+D/+C Musculoskeletal: no muscle/joint aches Skin: no rashes, + easy bruising Neurological: no tremors/numbness/tingling/dizziness Psychiatric: + both: depression/anxiety + low libido, pbs with erections  Past Medical History  Diagnosis Date  . Nonischemic cardiomyopathy     EF 20% s/p INSERTION OF BI-V DEFIBRILLATOR.  . Chronic systolic heart failure     LEFT  . LBBB (left bundle branch block)   . Alcohol abuse     PREVIOUS H/O DRINKING A FIFTH OF VODKA  A DAY  . History of angina   . Stroke     TIA's  . Thyroid disease     HYPOTHYROIDISM.....TREATED  . ICD (implantable cardiac defibrillator) in place   . CHF (congestive heart failure)   .  Anginal pain   . Chronic kidney disease     acute renal  04/2012  . Arthritis     hands & back  . Coronary artery disease   . TIA (transient ischemic attack)    Past Surgical History  Procedure Laterality Date  . Insert / replace / remove pacemaker      BI-V IMPLANTABLE CARDIOVERTER-DEFIBRILLATOR. ; MEDTRONIC CONCERTO; MODEL  # [id number], SERIAL # [id number] H.  DR. Humberto Leep EDMUNDS.  Marland Kitchen Cardiac catheterization    . Tonsillectomy    . Cystoscopy w/ stone manipulation    . Vasectomy    . Flexible sigmoidoscopy N/A 04/29/2012    Procedure: FLEXIBLE SIGMOIDOSCOPY;  Surgeon:  Vertell Novak., MD;  Location[facility] ENDOSCOPY;  Service: Endoscopy;  Laterality: N/A;  . Implantable cardioverter defibrillator (icd) generator change Bilateral 12/25/2011    Procedure: ICD GENERATOR CHANGE;  Surgeon: Marinus Maw, MD;  Location: [facility] CATH LAB;  Service: Cardiovascular;  Laterality: Bilateral;   History   Social History  . Marital Status: Widowed    Spouse Name: N/A    Number of Children: 3   Occupational History  . RETIRED     SOCIAL WORKER   Social History Main Topics  . Smoking status: Former Smoker -- 43 years    Quit date: 02/27/2012  . Smokeless tobacco: Never Used  . Alcohol Use: No     Comment: H/O ALCOHOLISM PERVIOUSLY A FIFTH OF VODKA A DAY  . Drug Use: No  . Sexual Activity: Not on file   Other Topics Concern  . Not on file   Social History Narrative   BI-V IMPLANTABLE CARDIOVERTER-DEFIBRILLATOR; MEDTRONIC CONCERTO, MODEL # [id number]; SERIAL # [id number] H;       RETIRED SOCIAL WORKER      MARRIED 4 TIMES      H/O ALCOHOLISM; A FIFTH OF VODKA DAILY IN THE PAST   Current Outpatient Prescriptions on File Prior to Visit  Medication Sig Dispense Refill  . allopurinol (ZYLOPRIM) 100 MG tablet Take 100 mg by mouth daily.     Marland Kitchen aspirin 81 MG tablet Take 81 mg by mouth daily.    Marland Kitchen atorvastatin (LIPITOR) 40 MG tablet Take 40 mg by mouth daily.     . benazepril (LOTENSIN) 10 MG tablet Take 10 mg by mouth daily.    . carvedilol (COREG) 6.25 MG tablet Take 6.25 mg by mouth 2 (two) times daily with a meal.    . furosemide (LASIX) 20 MG tablet Take 20 mg by mouth daily.    Marland Kitchen levothyroxine (SYNTHROID, LEVOTHROID) 100 MCG tablet Take 100 mcg by mouth daily before breakfast.    . OVER THE COUNTER MEDICATION Take 1 tablet by mouth 2 (two) times daily. Multi-allergy tablet    . doxycycline (VIBRAMYCIN) 100 MG capsule Take 100 mg by mouth 2 (two) times daily.    . metoCLOPramide (REGLAN) 10 MG tablet Take 10 mg by mouth every 4 (four) hours as needed for nausea,  vomiting or refractory nausea / vomiting.    . nitroGLYCERIN (NITROSTAT) 0.4 MG SL tablet Place 0.4 mg under the tongue every 5 (five) minutes as needed for chest pain.     . potassium chloride (K-DUR,KLOR-CON) 10 MEQ tablet Take 10 mEq by mouth 2 (two) times daily.    . promethazine (PHENERGAN) 25 MG tablet Take 25 mg by mouth every 6 (six) hours as needed for nausea or vomiting.     No current facility-administered medications on file prior to visit.  Allergies  Allergen Reactions  . Sulfa Antibiotics Nausea And Vomiting   Family History  Problem Relation Age of Onset  . Heart disease Mother   . Heart disease Father   . Cancer    . Hypertension     PE: BP 142/80 mmHg  Pulse 75  Temp(Src) 98 F (36.7 C) (Oral)  Resp 12  Ht 5\' 11"  (1.803 m)  Wt 218 lb 12.8 oz (99.247 kg)  BMI 30.53 kg/m2  SpO2 98% Wt Readings from Last 3 Encounters:  02/13/14 218 lb 12.8 oz (99.247 kg)  10/13/13 228 lb 9.6 oz (103.692 kg)  09/28/13 224 lb 1.6 oz (101.651 kg)   Constitutional: overweight, in NAD Eyes: PERRLA, EOMI, no exophthalmos ENT: moist mucous membranes, no thyromegaly, no cervical lymphadenopathy Cardiovascular: RRR, No MRG Respiratory: CTA B Gastrointestinal: abdomen soft, NT, ND, BS+ Musculoskeletal: no deformities, strength intact in all 4 Skin: moist, warm, no rashes Neurological: no tremor with outstretched hands, DTR normal in all 4  ASSESSMENT: 1. Hypothyroidism  2. Hypocalcemia  3. Vit D def  PLAN:  1. Patient with long-standing hypothyroidism, on levothyroxine therapy. She does not appear to have a goiter, thyroid nodules, or neck compression symptoms - He is not taking his LT4 correctly: he takes it along with am calcium! >> We discussed about correct intake of levothyroxine, fasting, with water, separated by at least 30 minutes from breakfast, and separated by more than 4 hours from calcium, iron, multivitamins, acid reflux medications (PPIs). I advised him to move  calcium to lunchtime. - will check thyroid tests in 5-6 weeks: TSH, free T4 - If these are abnormal, he will need to return in 6-8 weeks for repeat labs - I will see him back in 3 months  2. Hypocalcemia and vitamin D deficiency  - likely related - Pt with h/o mild h/o hypocalcemia, with recently dx'ed vit D def >> started on high dose vit D >> then continued with 4000 IU daily  - discussed possible sxs of hypocalcemia >> he had both perioral numbness and acral cramps, not currently - we reviewed his previous pertinent labs together: the calcium, vit D levels and also his low Mg (although this was after an episode of diarrhea) - we discussed different strategies to replete his vit D >> may need to go back to Ergocalciferol once a mo or even once a week - for today, Will check: Orders Placed This Encounter  Procedures  . Vitamin D (25 hydroxy)  . Calcium, ionized  . BASIC METABOLIC PANEL WITH GFR  . Magnesium  - if he persists in having low calcium after repleting vit D and possibly Mg, we may need to look into other possible causes for his hypocalcemia   Component     Latest Ref Rng 02/13/2014  Sodium     135 - 145 mEq/L 138  Potassium     3.5 - 5.3 mEq/L 4.1  Chloride     96 - 112 mEq/L 97  CO2     19 - 32 mEq/L 31  Glucose     70 - 99 mg/dL 96  BUN     6 - 23 mg/dL 10  Creatinine     3.52 - 1.35 mg/dL 4.81  Calcium     8.4 - 10.5 mg/dL 8.9  GFR, Est African American      >89  GFR, Est Non African American      85  VITD     30.00 - 100.00 ng/mL  36.57  Calcium Ionized     1.12 - 1.32 mmol/L 1.15  Magnesium     1.5 - 2.5 mg/dL 1.6   Ca, vit D normal. Mg a little low >> will suggest to start an OTC supplement of 400-500 mg Mg daily. OTW, continue vitamin D 4000 units daily.

## 2014-02-14 ENCOUNTER — Other Ambulatory Visit (HOSPITAL_BASED_OUTPATIENT_CLINIC_OR_DEPARTMENT_OTHER): Payer: Medicare Other

## 2014-02-14 ENCOUNTER — Ambulatory Visit (HOSPITAL_COMMUNITY)
Admission: RE | Admit: 2014-02-14 | Discharge: 2014-02-14 | Disposition: A | Discharge status: Home / Self Care | Payer: Medicare Other | Source: Ambulatory Visit | Attending: Internal Medicine | Admitting: Internal Medicine

## 2014-02-14 ENCOUNTER — Encounter (HOSPITAL_COMMUNITY): Payer: Self-pay

## 2014-02-14 DIAGNOSIS — R1900 Intra-abdominal and pelvic swelling, mass and lump, unspecified site: Secondary | ICD-10-CM | POA: Insufficient documentation

## 2014-02-14 DIAGNOSIS — K7689 Other specified diseases of liver: Secondary | ICD-10-CM | POA: Diagnosis not present

## 2014-02-14 DIAGNOSIS — I700 Atherosclerosis of aorta: Secondary | ICD-10-CM | POA: Diagnosis not present

## 2014-02-14 DIAGNOSIS — R591 Generalized enlarged lymph nodes: Secondary | ICD-10-CM

## 2014-02-14 HISTORY — DX: Essential (primary) hypertension: I10

## 2014-02-14 LAB — BASIC METABOLIC PANEL (CC13)
Anion Gap: 10 mEq/L (ref 3–11)
BUN: 8.2 mg/dL (ref 7.0–26.0)
CO2: 31 mEq/L — ABNORMAL HIGH (ref 22–29)
Calcium: 8.6 mg/dL (ref 8.4–10.4)
Chloride: 97 mEq/L — ABNORMAL LOW (ref 98–109)
Creatinine: 0.8 mg/dL (ref 0.7–1.3)
EGFR: >90 mL/min/{1.73_m2} (ref >90)
Glucose: 77 mg/dl (ref 70–140)
Potassium: 3.7 mEq/L (ref 3.5–5.1)
SODIUM: 138 meq/L (ref 136–145)

## 2014-02-14 LAB — BASIC METABOLIC PANEL WITH GFR
BUN: 10 mg/dL (ref 6–23)
CALCIUM: 8.9 mg/dL (ref 8.4–10.5)
CHLORIDE: 97 meq/L (ref 96–112)
CO2: 31 meq/L (ref 19–32)
CREATININE: 0.92 mg/dL (ref 0.50–1.35)
GFR, Est African American: >89 mL/min
GFR, Est Non African American: 85 mL/min
Glucose, Bld: 96 mg/dL (ref 70–99)
Potassium: 4.1 mEq/L (ref 3.5–5.3)
Sodium: 138 mEq/L (ref 135–145)

## 2014-02-14 LAB — CBC WITH DIFFERENTIAL/PLATELET
BASO%: 1.2 % (ref 0.0–2.0)
BASOS ABS: 0.1 10*3/uL (ref 0.0–0.1)
EOS%: 1.4 % (ref 0.0–7.0)
Eosinophils Absolute: 0.1 10*3/uL (ref 0.0–0.5)
HEMATOCRIT: 41.1 % (ref 38.4–49.9)
HEMOGLOBIN: 13.4 g/dL (ref 13.0–17.1)
LYMPH#: 1.5 10*3/uL (ref 0.9–3.3)
LYMPH%: 25.9 % (ref 14.0–49.0)
MCH: 29.3 pg (ref 27.2–33.4)
MCHC: 32.7 g/dL (ref 32.0–36.0)
MCV: 89.7 fL (ref 79.3–98.0)
MONO#: 0.7 10*3/uL (ref 0.1–0.9)
MONO%: 11.1 % (ref 0.0–14.0)
NEUT#: 3.6 10*3/uL (ref 1.5–6.5)
NEUT%: 60.4 % (ref 39.0–75.0)
Platelets: 165 10*3/uL (ref 140–400)
RBC: 4.58 10*6/uL (ref 4.20–5.82)
RDW: 17.1 % — ABNORMAL HIGH (ref 11.0–14.6)
WBC: 5.9 10*3/uL (ref 4.0–10.3)

## 2014-02-14 LAB — CALCIUM, IONIZED: CALCIUM ION: 1.15 mmol/L (ref 1.12–1.32)

## 2014-02-14 LAB — LACTATE DEHYDROGENASE (CC13): LDH: 181 U/L (ref 125–245)

## 2014-02-14 LAB — VITAMIN D 25 HYDROXY (VIT D DEFICIENCY, FRACTURES): VITD: 36.57 ng/mL (ref 30.00–100.00)

## 2014-02-14 MED ORDER — IOHEXOL 300 MG/ML  SOLN
100.0000 mL | Freq: Once | INTRAMUSCULAR | Status: AC | PRN
  Administered 2014-02-14: 100 mL via INTRAVENOUS

## 2014-02-16 ENCOUNTER — Ambulatory Visit: Payer: Medicare Other | Admitting: Hematology

## 2014-02-16 ENCOUNTER — Telehealth: Payer: Self-pay | Admitting: Hematology

## 2014-02-16 NOTE — Telephone Encounter (Signed)
Pt called and r/s appt from 12/31 to 01/08 due to being sick. Pt is calling PCP. Confirm appt.

## 2014-02-24 ENCOUNTER — Ambulatory Visit: Payer: Medicare Other | Admitting: Hematology

## 2014-02-24 ENCOUNTER — Telehealth: Payer: Self-pay | Admitting: Hematology

## 2014-02-24 NOTE — Telephone Encounter (Signed)
PT r/s appt from 02/24/14 to 03/14/14 due to car not starting. Pt confirm appt.

## 2014-03-06 ENCOUNTER — Encounter: Payer: Self-pay | Admitting: Internal Medicine

## 2014-03-06 ENCOUNTER — Ambulatory Visit (INDEPENDENT_AMBULATORY_CARE_PROVIDER_SITE_OTHER): Payer: PPO | Admitting: Internal Medicine

## 2014-03-06 VITALS — BP 142/98 | HR 77 | Ht 73.0 in | Wt 231.0 lb

## 2014-03-06 DIAGNOSIS — I472 Ventricular tachycardia, unspecified: Secondary | ICD-10-CM

## 2014-03-06 DIAGNOSIS — I5022 Chronic systolic (congestive) heart failure: Secondary | ICD-10-CM

## 2014-03-06 DIAGNOSIS — Z95810 Presence of automatic (implantable) cardiac defibrillator: Secondary | ICD-10-CM | POA: Diagnosis not present

## 2014-03-06 DIAGNOSIS — G459 Transient cerebral ischemic attack, unspecified: Secondary | ICD-10-CM

## 2014-03-06 DIAGNOSIS — F1010 Alcohol abuse, uncomplicated: Secondary | ICD-10-CM

## 2014-03-06 LAB — MDC_IDC_ENUM_SESS_TYPE_INCLINIC
Brady Statistic AS VP Percent: 77.94 %
Brady Statistic AS VS Percent: 6.99 %
Date Time Interrogation Session: 20160118101633
HIGH POWER IMPEDANCE MEASURED VALUE: 46 Ohm
HighPow Impedance: 171 Ohm
HighPow Impedance: 55 Ohm
HighPow Impedance: 589 Ohm
Lead Channel Impedance Value: 247 Ohm
Lead Channel Impedance Value: 418 Ohm
Lead Channel Impedance Value: 532 Ohm
Lead Channel Impedance Value: 608 Ohm
Lead Channel Pacing Threshold Amplitude: 0.625 V
Lead Channel Pacing Threshold Pulse Width: 0.4 ms
Lead Channel Pacing Threshold Pulse Width: 0.4 ms
Lead Channel Pacing Threshold Pulse Width: 0.4 ms
Lead Channel Sensing Intrinsic Amplitude: 2.25 mV
Lead Channel Sensing Intrinsic Amplitude: 4.5 mV
Lead Channel Setting Pacing Amplitude: 2 V
Lead Channel Setting Pacing Pulse Width: 0.4 ms
Lead Channel Setting Sensing Sensitivity: 0.3 mV
MDC IDC MSMT BATTERY VOLTAGE: 3.1 V
MDC IDC MSMT LEADCHNL LV PACING THRESHOLD AMPLITUDE: 1.25 V
MDC IDC MSMT LEADCHNL RA IMPEDANCE VALUE: 475 Ohm
MDC IDC MSMT LEADCHNL RA PACING THRESHOLD AMPLITUDE: 0.75 V
MDC IDC SET LEADCHNL LV PACING AMPLITUDE: 2.25 V
MDC IDC SET LEADCHNL LV PACING PULSEWIDTH: 0.4 ms
MDC IDC SET LEADCHNL RV PACING AMPLITUDE: 2.5 V
MDC IDC SET ZONE DETECTION INTERVAL: 240 ms
MDC IDC SET ZONE DETECTION INTERVAL: 350 ms
MDC IDC SET ZONE DETECTION INTERVAL: 350 ms
MDC IDC SET ZONE DETECTION INTERVAL: 450 ms
MDC IDC STAT BRADY AP VP PERCENT: 15.04 %
MDC IDC STAT BRADY AP VS PERCENT: 0.03 %
MDC IDC STAT BRADY RA PERCENT PACED: 15.07 %
MDC IDC STAT BRADY RV PERCENT PACED: 92.98 %
Zone Setting Detection Interval: 300 ms

## 2014-03-06 NOTE — Assessment & Plan Note (Signed)
His medtronic Biv ICD is working normally. Will recheck in several months. 

## 2014-03-06 NOTE — Patient Instructions (Addendum)
Remote monitoring is used to monitor your Pacemaker of ICD from home. This monitoring reduces the number of office visits required to check your device to one time per year. It allows [place] to keep an eye on the functioning of your device to ensure it is working properly. You are scheduled for a device check from home on 06/05/14. You may send your transmission at any time that day. If you have a wireless device, the transmission will be sent automatically. After your physician reviews your transmission, you will receive a postcard with your next transmission date.  Your physician wants you to follow-up in: 1 YEAR WITH DR. Ladona Ridgel.  You will receive a reminder letter in the mail two months in advance. If you don't receive a letter, please call our office to schedule the follow-up appointment.   Your physician recommends that you continue on your current medications as directed. Please refer to the Current Medication list given to you today.

## 2014-03-06 NOTE — Assessment & Plan Note (Signed)
He has had no significant atrial arrhythmias on ICD interogation. He would likely not be a candidate for systemic anti-coagulation because of his ETOH abuse which is currently ongoing.

## 2014-03-06 NOTE — Progress Notes (Signed)
HPI Don [name] returns today for followup. He is a very pleasant 69 year old man with a history of nonischemic cardiomyopathy, chronic systolic heart failure, left bundle branch block, status post biventricular ICD implantation. The patient has a history of alcohol abuse, and admits to return to drinking, as he has been depressed. He denies syncope. He denies ICD shock, chest pain, or worsening shortness of breath. His heart failure symptoms are class II. Several months ago, he was exercising regularly, but has not done so recently. He has had mild peripheral edema. Allergies  Allergen Reactions  . Sulfa Antibiotics Nausea And Vomiting     Current Outpatient Prescriptions  Medication Sig Dispense Refill  . allopurinol (ZYLOPRIM) 100 MG tablet Take 100 mg by mouth daily.     Marland Kitchen aspirin 81 MG tablet Take 81 mg by mouth daily.    Marland Kitchen atorvastatin (LIPITOR) 40 MG tablet Take 40 mg by mouth daily.     . benazepril (LOTENSIN) 10 MG tablet Take 10 mg by mouth daily.    . carvedilol (COREG) 6.25 MG tablet Take 6.25 mg by mouth 2 (two) times daily with a meal.    . Cholecalciferol 4000 UNITS CAPS Take 1 capsule by mouth daily.    . furosemide (LASIX) 20 MG tablet Take 20 mg by mouth daily.    Marland Kitchen levothyroxine (SYNTHROID, LEVOTHROID) 100 MCG tablet Take 100 mcg by mouth daily before breakfast.    . OVER THE COUNTER MEDICATION Take 1 tablet by mouth 2 (two) times daily. Multi-allergy tablet    . nitroGLYCERIN (NITROSTAT) 0.4 MG SL tablet Place 0.4 mg under the tongue every 5 (five) minutes as needed for chest pain.      No current facility-administered medications for this visit.     Past Medical History  Diagnosis Date  . Nonischemic cardiomyopathy     EF 20% s/p INSERTION OF BI-V DEFIBRILLATOR.  . Chronic systolic heart failure     LEFT  . LBBB (left bundle branch block)   . Alcohol abuse     PREVIOUS H/O DRINKING A FIFTH OF VODKA  A DAY  . History of angina   . Stroke     TIA's    . Thyroid disease     HYPOTHYROIDISM.....TREATED  . ICD (implantable cardiac defibrillator) in place   . CHF (congestive heart failure)   . Anginal pain   . Chronic kidney disease     acute renal  04/2012  . Arthritis     hands & back  . Coronary artery disease   . TIA (transient ischemic attack)   . Hypertension     ROS:   All systems reviewed and negative except as noted in the HPI.   Past Surgical History  Procedure Laterality Date  . Insert / replace / remove pacemaker      BI-V IMPLANTABLE CARDIOVERTER-DEFIBRILLATOR. ; MEDTRONIC CONCERTO; MODEL  # [id number], SERIAL # [id number] H.  DR. Humberto Leep EDMUNDS.  Marland Kitchen Cardiac catheterization    . Tonsillectomy    . Cystoscopy w/ stone manipulation    . Vasectomy    . Flexible sigmoidoscopy N/A 04/29/2012    Procedure: FLEXIBLE SIGMOIDOSCOPY;  Surgeon: Vertell Novak., MD;  Location: [facility] ENDOSCOPY;  Service: Endoscopy;  Laterality: N/A;  . Implantable cardioverter defibrillator (icd) generator change Bilateral 12/25/2011    Procedure: ICD GENERATOR CHANGE;  Surgeon: Marinus Maw, MD;  Location: [facility] CATH LAB;  Service: Cardiovascular;  Laterality: Bilateral;     Family History  Problem Relation Age of Onset  . Heart disease Mother   . Heart disease Father   . Cancer    . Hypertension       History   Social History  . Marital Status: Widowed    Spouse Name: N/A    Number of Children: N/A  . Years of Education: N/A   Occupational History  . RETIRED     SOCIAL WORKER   Social History Main Topics  . Smoking status: Former Smoker -- 43 years    Quit date: 02/27/2012  . Smokeless tobacco: Never Used  . Alcohol Use: No     Comment: H/O ALCOHOLISM PERVIOUSLY A FIFTH OF VODKA A DAY  . Drug Use: No  . Sexual Activity: Not on file   Other Topics Concern  . Not on file   Social History Narrative   BI-V IMPLANTABLE CARDIOVERTER-DEFIBRILLATOR; MEDTRONIC CONCERTO, MODEL # [id number]; SERIAL # [id number] H;       RETIRED SOCIAL  WORKER      MARRIED 4 TIMES      H/O ALCOHOLISM; A FIFTH OF VODKA DAILY IN THE PAST              BP 142/98 mmHg  Pulse 77  Ht 6\' 1"  (1.854 m)  Wt 231 lb (104.781 kg)  BMI 30.48 kg/m2  Physical Exam:  Well appearing 69 year old man, NAD HEENT: Unremarkable Neck:  6 cm JVD, no thyromegally Back:  No CVA tenderness Lungs:  Clear with no wheezes, rales, or rhonchi. HEART:  Regular rate rhythm, no murmurs, no rubs, no clicks Abd:  soft, positive bowel sounds, no organomegally, no rebound, no guarding Ext:  2 plus pulses, no edema, no cyanosis, no clubbing Skin:  No rashes no nodules Neuro:  CN II through XII intact, motor grossly intact  ECG - nsr with PVC's.  DEVICE  Normal device function.  See PaceArt for details.   Assess/Plan:

## 2014-03-06 NOTE — Assessment & Plan Note (Signed)
He has had no sustained VT. He will continue his current meds.

## 2014-03-06 NOTE — Assessment & Plan Note (Signed)
His symptoms are currently class 2. He will continue his current meds. I have strongly encouraged the patient to stop drinking ETOH.

## 2014-03-06 NOTE — Assessment & Plan Note (Signed)
I have strongly encouraged the patient to stop drinking. He admits to continued ETOH use drinking up to a 5th of Vodka a day.

## 2014-03-07 ENCOUNTER — Encounter: Payer: Self-pay | Admitting: Internal Medicine

## 2014-03-14 ENCOUNTER — Encounter: Payer: Self-pay | Admitting: Hematology

## 2014-03-14 ENCOUNTER — Ambulatory Visit (HOSPITAL_BASED_OUTPATIENT_CLINIC_OR_DEPARTMENT_OTHER): Payer: PPO | Admitting: Hematology

## 2014-03-14 VITALS — BP 157/91 | HR 69 | Temp 98.4°F | Resp 19 | Ht 73.0 in | Wt 222.8 lb

## 2014-03-14 DIAGNOSIS — J449 Chronic obstructive pulmonary disease, unspecified: Secondary | ICD-10-CM

## 2014-03-14 DIAGNOSIS — R1900 Intra-abdominal and pelvic swelling, mass and lump, unspecified site: Secondary | ICD-10-CM

## 2014-03-14 DIAGNOSIS — F1010 Alcohol abuse, uncomplicated: Secondary | ICD-10-CM

## 2014-03-14 NOTE — Progress Notes (Signed)
[place], [place] [address]  DIAGNOSIS: No diagnosis found.  Chief Complaint  Patient presents with  . Follow-up    CURRENT TREATMENT: Observation.  INTERVAL HISTORY: Don [name] 69 y.o. male who has a history of CHF (2001), defib/pacer, Graves Disease, COPD, history of TIAs and heart catheterization x 4 is here for follow up appointment for further evaluation of lymphadenopathy NOS. He was seen by me on 09/28/2013.   He was referred by Dr. Anson Fret of Gastroenterology. He saw Dr. Penelope Coop on 08/24/2013 and then on 09/21/2013 due to progressive non-specific gastrointestinal complaints including nausea, gas and bloatiness and dry heaves. He had an upper GI on 08/26/2013 which was unremarkable. Patient reportedly went to the ER with a lot these complaints and was found to dehydrated and give hydration. He had a CT of abdomen which revealed a 2.4 cm non-inflamed soft tissue density in the right abdominal retroperitoneum suspicious for lymphadenopathy. He saw Dr. Penelope Coop who spoke with interventional radiology and reportedly was unable to biopsy this area due to the location. Other options would be to do a PET scan to rule out lymphoma and to examine for evidence of other areas of disease. He had a flexible sigmoidoscopy because of diarrhea and bleeding and was found to have hemorrhoids. 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   He reported a few occasional night sweats over the past few months. He denied weight changes or fevers or chills. He reported being admitted for kidney failure in 04/2012. He had fallen and hurt himself with rib fractures. He was prescribed meloxicam and within hours, he reported feeling sick and was treated for acute kidney failure. He reported an extensive drinking with as much as a fifth of vodka daily. He reported starting to drink following her wife's death. She died  of multiple myeloma. He quit 5 weeks ago. He also noted an extensive smoking history as well. He smoked as much as two packs daily for the past thirty years. He followed with cardiology with Dr. Stephens Shire of [facility] and EP with Dr. Lovena Le. He reported shortness of breath with moderate exertion. He reported one episode of hemoptysis about a week ago. He reported occasional blood on the tissue paper. He had an interim PET scan to further evaluate for lymphoma and here to discuss the results.    INTERIM HISTORY: Don [name] returns for follow up. He feels well overall. He had an episode of nausea, sore throat and fever for 4-5 days one month ago and  Reoslved. He was depressed during the winter also, but feels better lately. He has low mild back pain, no other new pain, no nausea, night sweats, or weight stable.    MEDICAL HISTORY: Past Medical History  Diagnosis Date  . Nonischemic cardiomyopathy     EF 20% s/p INSERTION OF BI-V DEFIBRILLATOR.  . Chronic systolic heart failure     LEFT  . LBBB (left bundle branch block)   . Alcohol abuse     PREVIOUS H/O DRINKING A FIFTH OF VODKA  A DAY  . History of angina   . Stroke     TIA's  . Thyroid disease     HYPOTHYROIDISM.....TREATED  . ICD (implantable cardiac defibrillator) in place   . CHF (congestive heart failure)   . Anginal pain   . Chronic kidney disease     acute renal  04/2012  . Arthritis  hands & back  . Coronary artery disease   . TIA (transient ischemic attack)   . 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    ALLERGIES:  is allergic to sulfa antibiotics.  MEDICATIONS: has a current medication list which includes the following prescription(s): allopurinol, aspirin, atorvastatin, benazepril, calcium carbonate, carvedilol, cholecalciferol, furosemide, levothyroxine, OVER THE COUNTER MEDICATION, and nitroglycerin.  SURGICAL HISTORY:  Past Surgical History  Procedure Laterality Date  . Insert / replace / remove pacemaker      BI-V IMPLANTABLE CARDIOVERTER-DEFIBRILLATOR. ; MEDTRON[employer] CONCERTO; MODEL  # [id number], SERIAL # [id number] H.  DR. Desiree Lucy EDMUNDS.  Marland Kitchen Cardiac catheterization    . Tonsillectomy    . Cystoscopy w/ stone manipulation    . Vasectomy    . Flexible sigmoidoscopy N/A 04/29/2012    Procedure: FLEXIBLE SIGMOIDOSCOPY;  Surgeon: Winfield Cunas., MD;  Location: [facility] ENDOSCOPY;  Service: Endoscopy;  Laterality: N/A;  . Implantable cardioverter defibrillator (icd) generator change Bilateral 12/25/2011    Procedure: ICD GENERATOR CHANGE;  Surgeon: Evans Lance, MD;  Location: [facility] CATH LAB;  Service: Cardiovascular;  Laterality: Bilateral;    REVIEW OF SYSTEMS:   Constitutional: Denies fevers, chills or abnormal weight loss Eyes: Denies blurriness of vision Ears, nose, mouth, throat, and face: Denies mucositis or sore throat Respiratory: Denies cough, dyspnea or wheezes Cardiovascular: Denies palpitation, chest discomfort or lower extremity swelling Gastrointestinal:  Denies nausea, heartburn or change in bowel habits Skin: Denies abnormal skin rashes Lymphatics: Denies new lymphadenopathy or easy bruising Neurological:Denies numbness, tingling or new weaknesses Behavioral/Psych: Mood is stable, no new changes  All other systems were reviewed with the patient and are negative.  PHYSICAL EXAMINATION: ECOG PERFORMANCE STATUS: 0 - Asymptomatic  Blood pressure 157/91, pulse 69, temperature 98.4 F (36.9 C), temperature source Oral, resp. rate 19,  height _0  (1.854 m), weight 222 lb 12.8 oz (101.061 kg), SpO2 98 %.  GENERAL:alert, no distress and comfortable; well developed and well nourished; anxious. SKIN: skin color, texture, turgor are normal, no rashes or significant lesions EYES: normal, Conjunctiva are pink and non-injected, sclera clear OROPHARYNX:no exudate, no erythema and lips, buccal mucosa, and tongue normal  NECK: supple, thyroid normal size, non-tender, without nodularity LYMPH:  no palpable lymphadenopathy in the cervical, axillary or supraclavicular LUNGS: clear to auscultation with normal breathing effort, no wheezes or rhonchi HEART: regular rate & rhythm and no murmurs and no lower extremity edema ABDOMEN:abdomen soft, non-tender and normal bowel sounds Musculoskeletal:no cyanosis of digits and no clubbing  NEURO: alert & oriented x 3 with fluent speech, no focal motor/sensory deficits  Labs:  Lab Results  Component Value Date   WBC 5.9 02/14/2014   HGB 13.4 02/14/2014   HCT 41.1 02/14/2014   MCV 89.7 02/14/2014   PLT 165 02/14/2014   NEUTROABS 3.6 02/14/2014      Chemistry      Component Value Date/Time   NA 138 02/14/2014 0916   NA 138 02/13/2014 1401   K 3.7 02/14/2014 0916   K 4.1 02/13/2014 1401   CL 97 02/13/2014 1401   CO2 31* 02/14/2014 0916   CO2 31 02/13/2014 1401   BUN 8.2 02/14/2014 0916  BUN 10 02/13/2014 1401   CREATININE 0.8 02/14/2014 0916   CREATININE 0.92 02/13/2014 1401   CREATININE 0.91 08/22/2013 1535      Component Value Date/Time   CALCIUM 8.6 02/14/2014 0916   CALCIUM 8.9 02/13/2014 1401   ALKPHOS 72 09/28/2013 1340   ALKPHOS 92 08/22/2013 1535   AST 24 09/28/2013 1340   AST 68* 08/22/2013 1535   ALT 12 09/28/2013 1340   ALT 20 08/22/2013 1535   BILITOT 0.49 09/28/2013 1340   BILITOT 1.6* 08/22/2013 1535       RADIOGRAPHIC STUDIES: CT abd and pelvis w contrast 02/14/2014 IMPRESSION: Right retroperitoneal lesion in question measures slightly smaller on  today's study and remains at water density. Both features are extremely reassuring for a benign process, especially in light of the negative PET-CT. As mentioned previously, this could represent a GI duplication cyst or lymphangioma.    ASSESSMENT: Don [name] 69 y.o. male with a history of No diagnosis found.   PLAN:  1. Retroperitoneal cystic lesion, likely benign. --I reviewed his pedis CT of abdomen and the pelvis scan results with the patient, which showed a slightly smaller right epitrochlear peritoneal lesion, which was not hypermetabolic on the previous scan, this likely represents benign cyst. No additional adenopathy or other lesions was seen on the scan. -Giving the stability of the cystic lesion, I do not feel he needs further follow-up.  2. History of CHF (2001) s/p defibrillator/PPM, catheterization x 4.  -- Continue Coreg 6.25 mg daily, benazepril 10 mg daily, aspirin 81 mg daily and lasix 20 mg daily. Pleural effusions noted and patient reports eating cheetos and other salt indiscretion.  We recommended that he call his cardiologist for consideration of increased lasix and to stop salty foods.   3. COPD/ former Tobacco abuse.  --He stopped smoking nearly one month ago and declines tobacco cessation classes.   4. Graves s/p treatment now hypothyroidism  --Continue synthroid 100 mcg daily.   5. Former alcohol abuse.  --Patient was counseled to abstain from alcohol use. He did have a history of an elevated AST. He does have mild cirrhosis noted on the PET.   6. Follow up.  -I suggest him to follow-up with his primary care physician and cardiologist. -I'll see him only as needed in the future.  All questions were answered. The patient knows to call the clinic with any problems, questions or concerns. We can certainly see the patient much sooner if necessary.  I spent 15 minutes counseling the patient face to face. The total time spent in the appointment was 25  minutes.    Truitt Merle, MD _0 (<PARAMETER> error)@ 5:56 PM

## 2014-06-05 ENCOUNTER — Telehealth: Payer: Self-pay | Admitting: Cardiology

## 2014-06-05 ENCOUNTER — Ambulatory Visit (INDEPENDENT_AMBULATORY_CARE_PROVIDER_SITE_OTHER): Payer: PPO | Admitting: *Deleted

## 2014-06-05 DIAGNOSIS — I5022 Chronic systolic (congestive) heart failure: Secondary | ICD-10-CM | POA: Diagnosis not present

## 2014-06-05 DIAGNOSIS — I472 Ventricular tachycardia, unspecified: Secondary | ICD-10-CM

## 2014-06-05 NOTE — Telephone Encounter (Signed)
Spoke with pt and reminded pt of remote transmission that is due today. Pt verbalized understanding.   

## 2014-06-05 NOTE — Progress Notes (Signed)
Remote ICD transmission.   

## 2014-06-07 LAB — MDC_IDC_ENUM_SESS_TYPE_REMOTE
Battery Voltage: 3.08 V
Brady Statistic AP VP Percent: 12.34 %
Brady Statistic AP VS Percent: 0.03 %
Brady Statistic AS VP Percent: 80.33 %
Brady Statistic AS VS Percent: 7.3 %
HIGH POWER IMPEDANCE MEASURED VALUE: 608 Ohm
HighPow Impedance: 43 Ohm
HighPow Impedance: 51 Ohm
Lead Channel Impedance Value: 228 Ohm
Lead Channel Impedance Value: 418 Ohm
Lead Channel Impedance Value: 532 Ohm
Lead Channel Impedance Value: 665 Ohm
Lead Channel Pacing Threshold Amplitude: 1.25 V
Lead Channel Sensing Intrinsic Amplitude: 2.625 mV
Lead Channel Sensing Intrinsic Amplitude: 4 mV
Lead Channel Sensing Intrinsic Amplitude: 4 mV
Lead Channel Setting Pacing Amplitude: 2 V
Lead Channel Setting Pacing Amplitude: 2.25 V
Lead Channel Setting Pacing Amplitude: 2.5 V
Lead Channel Setting Pacing Pulse Width: 0.4 ms
MDC IDC MSMT LEADCHNL LV PACING THRESHOLD PULSEWIDTH: 0.4 ms
MDC IDC MSMT LEADCHNL RA IMPEDANCE VALUE: 456 Ohm
MDC IDC MSMT LEADCHNL RA PACING THRESHOLD AMPLITUDE: 0.625 V
MDC IDC MSMT LEADCHNL RA PACING THRESHOLD PULSEWIDTH: 0.4 ms
MDC IDC MSMT LEADCHNL RA SENSING INTR AMPL: 2.625 mV
MDC IDC MSMT LEADCHNL RV PACING THRESHOLD AMPLITUDE: 0.5 V
MDC IDC MSMT LEADCHNL RV PACING THRESHOLD PULSEWIDTH: 0.4 ms
MDC IDC SESS DTM: 20160418182912
MDC IDC SET LEADCHNL LV PACING PULSEWIDTH: 0.4 ms
MDC IDC SET LEADCHNL RV SENSING SENSITIVITY: 0.3 mV
MDC IDC SET ZONE DETECTION INTERVAL: 350 ms
MDC IDC SET ZONE DETECTION INTERVAL: 450 ms
MDC IDC STAT BRADY RA PERCENT PACED: 12.37 %
MDC IDC STAT BRADY RV PERCENT PACED: 92.68 %
Zone Setting Detection Interval: 240 ms
Zone Setting Detection Interval: 300 ms
Zone Setting Detection Interval: 350 ms

## 2014-06-12 ENCOUNTER — Encounter: Payer: Self-pay | Admitting: Cardiology

## 2014-06-15 ENCOUNTER — Encounter: Payer: Self-pay | Admitting: Internal Medicine

## 2014-07-18 ENCOUNTER — Ambulatory Visit: Payer: Medicare Other | Admitting: Internal Medicine

## 2014-07-18 DIAGNOSIS — Z0289 Encounter for other administrative examinations: Secondary | ICD-10-CM

## 2014-09-04 ENCOUNTER — Emergency Department (HOSPITAL_COMMUNITY): Payer: PPO

## 2014-09-04 ENCOUNTER — Encounter (HOSPITAL_COMMUNITY): Payer: Self-pay | Admitting: *Deleted

## 2014-09-04 ENCOUNTER — Emergency Department (HOSPITAL_COMMUNITY)
Admission: EM | Admit: 2014-09-04 | Discharge: 2014-09-04 | Disposition: A | Discharge status: Home / Self Care | Payer: PPO | Attending: Emergency Medicine | Admitting: Emergency Medicine

## 2014-09-04 DIAGNOSIS — N189 Chronic kidney disease, unspecified: Secondary | ICD-10-CM | POA: Diagnosis not present

## 2014-09-04 DIAGNOSIS — Z9889 Other specified postprocedural states: Secondary | ICD-10-CM | POA: Insufficient documentation

## 2014-09-04 DIAGNOSIS — I129 Hypertensive chronic kidney disease with stage 1 through stage 4 chronic kidney disease, or unspecified chronic kidney disease: Secondary | ICD-10-CM | POA: Diagnosis not present

## 2014-09-04 DIAGNOSIS — Z7982 Long term (current) use of aspirin: Secondary | ICD-10-CM | POA: Diagnosis not present

## 2014-09-04 DIAGNOSIS — Z79899 Other long term (current) drug therapy: Secondary | ICD-10-CM | POA: Diagnosis not present

## 2014-09-04 DIAGNOSIS — Z87891 Personal history of nicotine dependence: Secondary | ICD-10-CM | POA: Insufficient documentation

## 2014-09-04 DIAGNOSIS — Z8673 Personal history of transient ischemic attack (TIA), and cerebral infarction without residual deficits: Secondary | ICD-10-CM | POA: Insufficient documentation

## 2014-09-04 DIAGNOSIS — Z95810 Presence of automatic (implantable) cardiac defibrillator: Secondary | ICD-10-CM | POA: Diagnosis not present

## 2014-09-04 DIAGNOSIS — I5022 Chronic systolic (congestive) heart failure: Secondary | ICD-10-CM | POA: Diagnosis not present

## 2014-09-04 DIAGNOSIS — E039 Hypothyroidism, unspecified: Secondary | ICD-10-CM | POA: Diagnosis not present

## 2014-09-04 DIAGNOSIS — I509 Heart failure, unspecified: Secondary | ICD-10-CM | POA: Diagnosis not present

## 2014-09-04 DIAGNOSIS — R109 Unspecified abdominal pain: Secondary | ICD-10-CM | POA: Diagnosis present

## 2014-09-04 DIAGNOSIS — M1990 Unspecified osteoarthritis, unspecified site: Secondary | ICD-10-CM | POA: Insufficient documentation

## 2014-09-04 DIAGNOSIS — K529 Noninfective gastroenteritis and colitis, unspecified: Secondary | ICD-10-CM | POA: Insufficient documentation

## 2014-09-04 DIAGNOSIS — F1010 Alcohol abuse, uncomplicated: Secondary | ICD-10-CM | POA: Diagnosis not present

## 2014-09-04 DIAGNOSIS — I25119 Atherosclerotic heart disease of native coronary artery with unspecified angina pectoris: Secondary | ICD-10-CM | POA: Insufficient documentation

## 2014-09-04 DIAGNOSIS — R110 Nausea: Secondary | ICD-10-CM

## 2014-09-04 LAB — URINALYSIS, ROUTINE W REFLEX MICROSCOPIC
Glucose, UA: NEGATIVE mg/dL
Hgb urine dipstick: NEGATIVE
KETONES UR: 40 mg/dL — AB
LEUKOCYTES UA: NEGATIVE
NITRITE: NEGATIVE
PROTEIN: NEGATIVE mg/dL
Specific Gravity, Urine: 1.017 (ref 1.005–1.030)
UROBILINOGEN UA: 1 mg/dL (ref 0.0–1.0)
pH: 5.5 (ref 5.0–8.0)

## 2014-09-04 LAB — COMPREHENSIVE METABOLIC PANEL
ALBUMIN: 3.9 g/dL (ref 3.5–5.0)
ALT: 23 U/L (ref 17–63)
AST: 59 U/L — AB (ref 15–41)
Alkaline Phosphatase: 71 U/L (ref 38–126)
Anion gap: 13 (ref 5–15)
BUN: 7 mg/dL (ref 6–20)
CO2: 28 mmol/L (ref 22–32)
Calcium: 8.7 mg/dL — ABNORMAL LOW (ref 8.9–10.3)
Chloride: 100 mmol/L — ABNORMAL LOW (ref 101–111)
Creatinine, Ser: 0.92 mg/dL (ref 0.61–1.24)
GFR calc non Af Amer: >60 mL/min (ref >60)
GLUCOSE: 100 mg/dL — AB (ref 65–99)
Potassium: 3.9 mmol/L (ref 3.5–5.1)
Sodium: 141 mmol/L (ref 135–145)
Total Bilirubin: 1.8 mg/dL — ABNORMAL HIGH (ref 0.3–1.2)
Total Protein: 6.9 g/dL (ref 6.5–8.1)

## 2014-09-04 LAB — CBC
HCT: 40.5 % (ref 39.0–52.0)
HEMOGLOBIN: 14 g/dL (ref 13.0–17.0)
MCH: 32.3 pg (ref 26.0–34.0)
MCHC: 34.6 g/dL (ref 30.0–36.0)
MCV: 93.3 fL (ref 78.0–100.0)
Platelets: 162 10*3/uL (ref 150–400)
RBC: 4.34 MIL/uL (ref 4.22–5.81)
RDW: 14.9 % (ref 11.5–15.5)
WBC: 11.1 10*3/uL — ABNORMAL HIGH (ref 4.0–10.5)

## 2014-09-04 LAB — LIPASE, BLOOD: Lipase: 21 U/L — ABNORMAL LOW (ref 22–51)

## 2014-09-04 LAB — TROPONIN I: TROPONIN I: 0.03 ng/mL (ref <0.031)

## 2014-09-04 LAB — I-STAT TROPONIN, ED: TROPONIN I, POC: 0.03 ng/mL (ref 0.00–0.08)

## 2014-09-04 LAB — BRAIN NATRIURETIC PEPTIDE: B NATRIURETIC PEPTIDE 5: 255.6 pg/mL — AB (ref 0.0–100.0)

## 2014-09-04 LAB — LACTIC ACID, PLASMA: LACTIC ACID, VENOUS: 1.1 mmol/L (ref 0.5–2.0)

## 2014-09-04 MED ORDER — FENTANYL CITRATE (PF) 100 MCG/2ML IJ SOLN
100.0000 ug | Freq: Once | INTRAMUSCULAR | Status: AC
  Administered 2014-09-04: 100 ug via INTRAVENOUS
  Filled 2014-09-04: qty 2

## 2014-09-04 MED ORDER — IOHEXOL 300 MG/ML  SOLN
100.0000 mL | Freq: Once | INTRAMUSCULAR | Status: AC | PRN
  Administered 2014-09-04: 100 mL via INTRAVENOUS

## 2014-09-04 MED ORDER — SODIUM CHLORIDE 0.9 % IV SOLN
INTRAVENOUS | Status: DC
  Administered 2014-09-04: 13:00:00 via INTRAVENOUS

## 2014-09-04 MED ORDER — ONDANSETRON HCL 4 MG/2ML IJ SOLN
4.0000 mg | Freq: Once | INTRAMUSCULAR | Status: AC
  Administered 2014-09-04: 4 mg via INTRAVENOUS
  Filled 2014-09-04: qty 2

## 2014-09-04 MED ORDER — CIPROFLOXACIN HCL 500 MG PO TABS: 500.0000 mg | ORAL_TABLET | Freq: Two times a day (BID) | ORAL | Status: DC

## 2014-09-04 MED ORDER — OXYCODONE HCL 5 MG PO TABS: 5.0000 mg | ORAL_TABLET | ORAL | Status: DC | PRN

## 2014-09-04 MED ORDER — ONDANSETRON HCL 4 MG PO TABS: 4.0000 mg | ORAL_TABLET | Freq: Four times a day (QID) | ORAL | Status: DC

## 2014-09-04 MED ORDER — SUCRALFATE 1 G PO TABS: 1.0000 g | ORAL_TABLET | Freq: Three times a day (TID) | ORAL | Status: DC

## 2014-09-04 MED ORDER — PANTOPRAZOLE SODIUM 40 MG IV SOLR
40.0000 mg | Freq: Once | INTRAVENOUS | Status: AC
  Administered 2014-09-04: 40 mg via INTRAVENOUS
  Filled 2014-09-04: qty 40

## 2014-09-04 MED ORDER — METRONIDAZOLE 500 MG PO TABS: 500.0000 mg | ORAL_TABLET | Freq: Two times a day (BID) | ORAL | Status: DC

## 2014-09-04 MED ORDER — IOHEXOL 300 MG/ML  SOLN
25.0000 mL | Freq: Once | INTRAMUSCULAR | Status: AC | PRN
  Administered 2014-09-04: 25 mL via ORAL

## 2014-09-04 NOTE — ED Provider Notes (Signed)
CSN: [id number]     Arrival date & time 09/04/14  1222 History   First MD Initiated Contact with Patient 09/04/14 1240     Chief Complaint  Patient presents with  . Abdominal Pain  . Nausea      HPI  Expand All Collapse All   Pt was sent by MD for eval of abdominal pain N/V that started this morning. Pt reports tenderness to palpation as well. patient admits to drinking heavily proximally a fifth of vodka a day.  Said nausea and vomiting.  Was sent here by primary care physician.          Past Medical History  Diagnosis Date  . Nonischemic cardiomyopathy     EF 20% s/p INSERTION OF BI-V DEFIBRILLATOR.  . Chronic systolic heart failure     LEFT  . LBBB (left bundle branch block)   . Alcohol abuse     PREVIOUS H/O DRINKING A FIFTH OF VODKA  A DAY  . History of angina   . Stroke     TIA's  . Thyroid disease     HYPOTHYROIDISM.....TREATED  . ICD (implantable cardiac defibrillator) in place   . CHF (congestive heart failure)   . Anginal pain   . Chronic kidney disease     acute renal  04/2012  . Arthritis     hands & back  . Coronary artery disease   . TIA (transient ischemic attack)   . Hypertension    Past Surgical History  Procedure Laterality Date  . Insert / replace / remove pacemaker      BI-V IMPLANTABLE CARDIOVERTER-DEFIBRILLATOR. ; [employer] CONCERTO; MODEL  # [id number], SERIAL # [id number] H.  DR. Humberto Leep EDMUNDS.  Marland Kitchen Cardiac catheterization    . Tonsillectomy    . Cystoscopy w/ stone manipulation    . Vasectomy    . Flexible sigmoidoscopy N/A 04/29/2012    Procedure: FLEXIBLE SIGMOIDOSCOPY;  Surgeon: Vertell Novak., MD;  Location: [facility] ENDOSCOPY;  Service: Endoscopy;  Laterality: N/A;  . Implantable cardioverter defibrillator (icd) generator change Bilateral 12/25/2011    Procedure: ICD GENERATOR CHANGE;  Surgeon: Marinus Maw, MD;  Location: [facility] CATH LAB;  Service: Cardiovascular;  Laterality: Bilateral;   Family History  Problem Relation Age of Onset  .  Heart disease Mother   . Heart disease Father   . Cancer    . Hypertension     History  Substance Use Topics  . Smoking status: Former Smoker -- 43 years    Quit date: 02/27/2012  . Smokeless tobacco: Never Used  . Alcohol Use: No     Comment: H/O ALCOHOLISM PERVIOUSLY A FIFTH OF VODKA A DAY    Review of Systems    Allergies  Sulfa antibiotics  Home Medications   Prior to Admission medications   Medication Sig Start Date End Date Taking? Authorizing Provider  allopurinol (ZYLOPRIM) 100 MG tablet Take 100 mg by mouth daily.    Yes Historical Provider, MD  aspirin 81 MG tablet Take 81 mg by mouth daily.   Yes Historical Provider, MD  atorvastatin (LIPITOR) 40 MG tablet Take 40 mg by mouth daily.  09/03/11  Yes Historical Provider, MD  benazepril (LOTENSIN) 10 MG tablet Take 10 mg by mouth daily.   Yes Historical Provider, MD  calcium carbonate (TUMS - DOSED IN MG ELEMENTAL CALCIUM) 500 MG chewable tablet Chew 1 tablet by mouth daily.   Yes Historical Provider, MD  carvedilol (COREG) 6.25 MG tablet Take 6.25 mg  by mouth 2 (two) times daily with a meal.   Yes Historical Provider, MD  Cholecalciferol 4000 UNITS CAPS Take 1 capsule by mouth daily.   Yes Historical Provider, MD  furosemide (LASIX) 20 MG tablet Take 20 mg by mouth daily.   Yes Historical Provider, MD  levothyroxine (SYNTHROID, LEVOTHROID) 100 MCG tablet Take 100 mcg by mouth daily before breakfast.   Yes Historical Provider, MD  nitroGLYCERIN (NITROSTAT) 0.4 MG SL tablet Place 0.4 mg under the tongue every 5 (five) minutes as needed for chest pain.    Yes Historical Provider, MD  OVER THE COUNTER MEDICATION Take 1 tablet by mouth 2 (two) times daily. Multi-allergy tablet   Yes Historical Provider, MD  ciprofloxacin (CIPRO) 500 MG tablet Take 1 tablet (500 mg total) by mouth 2 (two) times daily. One po bid x 7 days 09/04/14   Pricilla Loveless, MD  metroNIDAZOLE (FLAGYL) 500 MG tablet Take 1 tablet (500 mg total) by mouth 2  (two) times daily. One po bid x 7 days 09/04/14   Pricilla Loveless, MD  ondansetron (ZOFRAN) 4 MG tablet Take 1 tablet (4 mg total) by mouth every 6 (six) hours. 09/04/14   Nelva Nay, MD  oxyCODONE (ROXICODONE) 5 MG immediate release tablet Take 1 tablet (5 mg total) by mouth every 4 (four) hours as needed for severe pain. 09/04/14   Pricilla Loveless, MD  sucralfate (CARAFATE) 1 G tablet Take 1 tablet (1 g total) by mouth 4 (four) times daily -  with meals and at bedtime. 09/04/14   Nelva Nay, MD   BP 105/86 mmHg  Pulse 90  Temp(Src) 97.9 F (36.6 C) (Oral)  Resp 22  SpO2 97% Physical Exam  Constitutional: He is oriented to person, place, and time. He appears well-developed and well-nourished. No distress.  HENT:  Head: Normocephalic and atraumatic.  Eyes: Pupils are equal, round, and reactive to light.  Neck: Normal range of motion.  Cardiovascular: Normal rate and intact distal pulses.   Pulmonary/Chest: No respiratory distress.  Abdominal: Normal appearance. He exhibits no distension. There is tenderness (Generalized with no evidence of surgical abdomen.  No rebound or guarding tenderness.  Bowel sounds are active.).  Musculoskeletal: Normal range of motion.  Neurological: He is alert and oriented to person, place, and time. No cranial nerve deficit.  Skin: Skin is warm and dry. No rash noted.  Psychiatric: He has a normal mood and affect. His behavior is normal.  Nursing note and vitals reviewed.   ED Course  Procedures (including critical care time) Labs Review Labs Reviewed  LIPASE, BLOOD - Abnormal; Notable for the following:    Lipase 21 (*)    All other components within normal limits  COMPREHENSIVE METABOLIC PANEL - Abnormal; Notable for the following:    Chloride 100 (*)    Glucose, Bld 100 (*)    Calcium 8.7 (*)    AST 59 (*)    Total Bilirubin 1.8 (*)    All other components within normal limits  CBC - Abnormal; Notable for the following:    WBC 11.1 (*)    All  other components within normal limits  URINALYSIS, ROUTINE W REFLEX MICROSCOPIC (NOT AT [facility]) - Abnormal; Notable for the following:    Color, Urine AMBER (*)    Bilirubin Urine SMALL (*)    Ketones, ur 40 (*)    All other components within normal limits  BRAIN NATRIURETIC PEPTIDE - Abnormal; Notable for the following:    B Natriuretic Peptide  255.6 (*)    All other components within normal limits  LACTIC ACID, PLASMA  TROPONIN I  TROPONIN I  I-STAT TROPOININ, ED    Imaging Review No results found.   CT Abdomen IMPRESSION: Segmental inflammatory changes of the small bowel in the left lower abdomen compatible with enteritis likely inflammatory or infectious in etiology. Ischemic process is not excluded. Clinical correlation and follow-up recommended. No bowel obstruction.  Sigmoid diverticulosis.  Minimal peripancreatic haziness, likely related to inflammatory changes in the left lower lung. Correlation with pancreatic enzymes recommended to exclude pancreatitis.  Stable appearing hypodense right retroperitoneal region.  EKG Interpretation   Date/Time:  Monday September 04 2014 12:29:24 EDT Ventricular Rate:  97 PR Interval:    QRS Duration: 100 QT Interval:  370 QTC Calculation: 469 R Axis:   -42 Text Interpretation:  Ventricular-paced rhythm with occasional Premature  ventricular complexes Abnormal ECG No significant change since last  tracing Confirmed by Zi Newbury  MD, Breslin Burklow ([id number]) on 09/04/2014 12:59:45 PM     Will obtain abdominal CT.  Patient turned over prior to results of CT scan.  MDM   Final diagnoses:  Nausea  Abdominal pain  Alcohol abuse  Enteritis        Nelva Nay, MD 09/14/14 [id number]

## 2014-09-04 NOTE — ED Notes (Signed)
Dr Beaton at bedside 

## 2014-09-04 NOTE — ED Provider Notes (Signed)
Care transferred to me. Patient CT scan shows small areas of inflammation consistent with enteritis. I doubt this is ischemic. This would help explain some of his vomiting although he is not really having diarrhea. His abdominal exam for me is mild tenderness periumbilically, otherwise he is well appearing. Pain is controlled. Patient was to go home as opposed to coming in for IV antibiotics. Will discharge on Cipro, Flagyl, and pain and nausea medicines. I recommended close follow-up with his PCP and discussed strict return precautions.  Results for orders placed or performed during the hospital encounter of 09/04/14  Lipase, blood  Result Value Ref Range   Lipase 21 (L) 22 - 51 U/L  Comprehensive metabolic panel  Result Value Ref Range   Sodium 141 135 - 145 mmol/L   Potassium 3.9 3.5 - 5.1 mmol/L   Chloride 100 (L) 101 - 111 mmol/L   CO2 28 22 - 32 mmol/L   Glucose, Bld 100 (H) 65 - 99 mg/dL   BUN 7 6 - 20 mg/dL   Creatinine, Ser 1.61 0.61 - 1.24 mg/dL   Calcium 8.7 (L) 8.9 - 10.3 mg/dL   Total Protein 6.9 6.5 - 8.1 g/dL   Albumin 3.9 3.5 - 5.0 g/dL   AST 59 (H) 15 - 41 U/L   ALT 23 17 - 63 U/L   Alkaline Phosphatase 71 38 - 126 U/L   Total Bilirubin 1.8 (H) 0.3 - 1.2 mg/dL   GFR calc non Af Amer >60 >60 mL/min   GFR calc Af Amer >60 >60 mL/min   Anion gap 13 5 - 15  CBC  Result Value Ref Range   WBC 11.1 (H) 4.0 - 10.5 K/uL   RBC 4.34 4.22 - 5.81 MIL/uL   Hemoglobin 14.0 13.0 - 17.0 g/dL   HCT 09.6 04.5 - 40.9 %   MCV 93.3 78.0 - 100.0 fL   MCH 32.3 26.0 - 34.0 pg   MCHC 34.6 30.0 - 36.0 g/dL   RDW 81.1 91.4 - 78.2 %   Platelets 162 150 - 400 K/uL  Urinalysis, Routine w reflex microscopic (not at [facility]  Result Value Ref Range   Color, Urine AMBER (A) YELLOW   APPearance CLEAR CLEAR   Specific Gravity, Urine 1.017 1.005 - 1.030   pH 5.5 5.0 - 8.0   Glucose, UA NEGATIVE NEGATIVE mg/dL   Hgb urine dipstick NEGATIVE NEGATIVE   Bilirubin Urine SMALL (A) NEGATIVE   Ketones,  ur 40 (A) NEGATIVE mg/dL   Protein, ur NEGATIVE NEGATIVE mg/dL   Urobilinogen, UA 1.0 0.0 - 1.0 mg/dL   Nitrite NEGATIVE NEGATIVE   Leukocytes, UA NEGATIVE NEGATIVE  Brain natriuretic peptide  Result Value Ref Range   B Natriuretic Peptide 255.6 (H) 0.0 - 100.0 pg/mL  Lactic acid, plasma  Result Value Ref Range   Lactic Acid, Venous 1.1 0.5 - 2.0 mmol/L  Troponin I  Result Value Ref Range   Troponin I 0.03 <0.031 ng/mL  I-stat troponin, ED  Result Value Ref Range   Troponin i, poc 0.03 0.00 - 0.08 ng/mL   Comment 3           Ct Head Wo Contrast  09/04/2014   CLINICAL DATA:  Personal history of stroke.  Right facial droop.  EXAM: CT HEAD WITHOUT CONTRAST  TECHNIQUE: Contiguous axial images were obtained from the base of the skull through the vertex without intravenous contrast.  COMPARISON:  08/22/2013  FINDINGS: Mild generalized brain atrophy. Mild chronic small-vessel disease of  the cerebral hemispheric white matter. No evidence of acute or subacute infarction, mass lesion, hemorrhage, hydrocephalus or extra-axial collection. The calvarium is unremarkable. Sinuses are clear except for some retention cysts or polyps. No fluid in the middle ears or mastoids. There is atherosclerotic calcification of the major vessels at the base of the brain.  IMPRESSION: No acute finding. Chronic small-vessel disease of the hemispheric white matter.   Electronically Signed   By: Paulina Fusi M.D.   On: 09/04/2014 15:28   Ct Abdomen Pelvis W Contrast  09/04/2014   CLINICAL DATA:  69 year old male with diffuse abdominal pain nausea vomiting  EXAM: CT ABDOMEN AND PELVIS WITH CONTRAST  TECHNIQUE: Multidetector CT imaging of the abdomen and pelvis was performed using the standard protocol following bolus administration of intravenous contrast.  CONTRAST:  OMNIPAQUE IOHEXOL 300 MG/ML  SOLN  COMPARISON:  Radiograph dated 09/04/2014 and CT 02/14/2014 and PET CT dated 10/05/2013  FINDINGS: The visualized lung  bases are clear. Cardiac pacemaker in the and coronary vascular surgery noted. No intra-abdominal free air or free fluid.  Mild hepatic steatosis. A stable 11 mm left hepatic hypodense lesion, incompletely characterized, likely a cyst. The gallbladder appears unremarkable there is mild stranding of the fat at the root of the mesentery. Correlation with pancreatic enzymes recommended to exclude pancreatitis. There is no dilatation of the main pancreatic duct. The spleen, adrenal glands, kidneys, visualized ureters and urinary bladder appear unremarkable. There is a 2.4 cm low attenuating lesion in the right retroperitoneal/perinephric region with no significant interval change in size. This lesion again demonstrates a fluid attenuation. The prostate and seminal vesicles are grossly unremarkable.  sigmoid diverticulosis without active inflammation. There is segmental inflammatory changes of a loop of small bowel in the left lower abdomen compatible with enteritis. This may inflammatory/infectious in etiology. An ischemic process is not excluded. Clinical correlation and follow-up suggested. There is no bowel obstruction. Normal appendix.  There is aortoiliac atherosclerotic disease. There is mild tortuosity of the abdominal aorta. The visualized abdominal aorta and IVC appear patent. No portal venous gas identified.  There is diffuse haziness and stranding of the mesenteric fat small scattered mesenteric and retroperitoneal lymph nodes. Small fat containing umbilical hernia. Degenerative changes of the spine. No acute fracture.  IMPRESSION: Segmental inflammatory changes of the small bowel in the left lower abdomen compatible with enteritis likely inflammatory or infectious in etiology. Ischemic process is not excluded. Clinical correlation and follow-up recommended. No bowel obstruction.  Sigmoid diverticulosis.  Minimal peripancreatic haziness, likely related to inflammatory changes in the left lower lung.  Correlation with pancreatic enzymes recommended to exclude pancreatitis.  Stable appearing hypodense right retroperitoneal region.   Electronically Signed   By: Elgie Collard M.D.   On: 09/04/2014 19:03   Dg Abd Acute W/chest  09/04/2014   CLINICAL DATA:  Mid abdominal pain with fever and severe nausea.  EXAM: DG ABDOMEN ACUTE W/ 1V CHEST  COMPARISON:  Abdominal CT 02/14/2014  FINDINGS: No cardiomegaly. Unremarkable positioning of biventricular ICD/pacer from the left. There is no edema, consolidation, effusion, or pneumothorax. Anterior right third rib bone island, confirmed on PET-CT 10/05/2013.  Normal bowel gas pattern. No concerning intra-abdominal mass effect or calcification. No evidence of pneumoperitoneum.  IMPRESSION: No acute findings in the chest or abdomen.   Electronically Signed   By: Marnee Spring M.D.   On: 09/04/2014 13:26      Pricilla Loveless, MD 09/04/14 [phone]

## 2014-09-04 NOTE — ED Notes (Signed)
Pt was sent by MD for eval of abdominal pain N/V that started this morning. Pt reports tenderness to palpation as well.

## 2014-09-04 NOTE — ED Notes (Signed)
Patient returned from XRay

## 2014-09-04 NOTE — ED Notes (Signed)
The patient is back in the room.

## 2014-09-04 NOTE — ED Notes (Signed)
Patient transported to CT 

## 2014-09-04 NOTE — ED Notes (Signed)
Patient is alert and orientedx4.  Patient was explained discharge instructions and they understood them with no questions.  The patient's neighbor, Rosanne Ashing Box is taking the patient home.

## 2014-09-04 NOTE — ED Notes (Signed)
Pt leaving for XRay. 

## 2014-09-04 NOTE — Discharge Instructions (Signed)
Abdominal Pain °Many things can cause belly (abdominal) pain. Most times, the belly pain is not dangerous. Many cases of belly pain can be watched and treated at home. °HOME CARE  °· Do not take medicines that help you go poop (laxatives) unless told to by your doctor. °· Only take medicine as told by your doctor. °· Eat or drink as told by your doctor. Your doctor will tell you if you should be on a special diet. °GET HELP IF: °· You do not know what is causing your belly pain. °· You have belly pain while you are sick to your stomach (nauseous) or have runny poop (diarrhea). °· You have pain while you pee or poop. °· Your belly pain wakes you up at night. °· You have belly pain that gets worse or better when you eat. °· You have belly pain that gets worse when you eat fatty foods. °· You have a fever. °GET HELP RIGHT AWAY IF:  °· The pain does not go away within 2 hours. °· You keep throwing up (vomiting). °· The pain changes and is only in the right or left part of the belly. °· You have bloody or tarry looking poop. °MAKE SURE YOU:  °· Understand these instructions. °· Will watch your condition. °· Will get help right away if you are not doing well or get worse. °Document Released: 07/23/2007 Document Revised: 02/08/2013 Document Reviewed: 10/13/2012 °ExitCare® Patient Information ©2015 [employer]. This information is not intended to replace advice given to you by your health care provider. Make sure you discuss any questions you have with your health care provider. ° ° °Emergency Department Resource Guide °1) Find a Doctor and Pay Out of Pocket °Although you won't have to find out who is covered by your insurance plan, it is a good idea to ask around and get recommendations. You will then need to call the office and see if the doctor you have chosen will accept you as a new patient and what types of options they offer for patients who are self-pay. Some doctors offer discounts or will set up payment plans  for their patients who do not have insurance, but you will need to ask so you aren't surprised when you get to your appointment. ° °2) Contact Your Local Health Department °Not all health departments have doctors that can see patients for sick visits, but many do, so it is worth a call to see if yours does. If you don't know where your local health department is, you can check in your phone book. The CDC also has a tool to help you locate your state's health department, and many state websites also have listings of all of their local health departments. ° °3) Find a Walk-in Clinic °If your illness is not likely to be very severe or complicated, you may want to try a walk in clinic. These are popping up all over the country in pharmacies, drugstores, and shopping centers. They're usually staffed by nurse practitioners or physician assistants that have been trained to treat common illnesses and complaints. They're usually fairly quick and inexpensive. However, if you have serious medical issues or chronic medical problems, these are probably not your best option. ° °No Primary Care Doctor: °- Call [facility] Connect at  832-8000 - they can help you locate a primary care doctor that  accepts your insurance, provides certain services, etc. °- Physician Referral Service- 1-800-533-3463 ° °Chronic Pain Problems: °Organization           Address  Phone   Notes  °[facility]  (336) 297-2271 Patients need to be referred by their primary care doctor.  ° °Medication Assistance: °Organization         Address  Phone   Notes  °[place] [facility] [address] °[place], [place] [address] (336) 641-8030 --Must be a resident of [place] °-- Must have NO insurance coverage whatsoever (no Medicaid/ Medicare, etc.) °-- The pt. MUST have a primary care doctor that directs their care regularly and follows them in the community °  °[facility]  (866) 331-1348   °[employer]  (888)  892-1162   ° °Agencies that provide inexpensive medical care: °Organization         Address  Phone   Notes  °[facility]  (336) 832-8035   °[facility]    (336) 832-7272   °[facility] [address] °[place], [place] [address] (336) 832-4777   °[facility] [address], °[place] (336) 271-4999   °[facility]    (336) 373-0678   °[facility]    (336) 272-1050   °[facility] ° [address], [place] Phone:  (336) 832-4444, Fax:  (336) 832-4440 Hours of Operation:  9 am - 6 pm, M-F.  Also accepts Medicaid/Medicare and self-pay.  °[facility] ° [address], [place] Phone: (336) 832-3150, Fax: (336) [phone]. Hours of Operation:  8:30 am - 5:30 pm, M-F.  Also accepts Medicaid and self-pay.  °[facility] [place] [address], [place] Phone: (336) 878-6027   °[facility] [address], [place], [place] (336)723-1848, Ext. 123 Mondays & Thursdays: 7-9 AM.  First 15 patients are seen on a first come, first serve basis. °  ° °Medicaid-accepting [place] Providers: ° °Organization         Address  Phone   Notes  °[facility] [address], [place] (336) 641-2100 Also accepts self-pay patients.  °[facility] [address], [place] ° (336) 856-9996   °[facility] [address], [place] (336) 288-8857   °[facility] [address] [place] [address], [place] (336) 299-7000   °Veita Bland [address], [place]  ° (336) 373-1557 Only accepts [place] Access Medicaid patients after they have their name applied to their card.  ° °Self-Pay (no insurance) in [place]: ° °Organization         Address  Phone   Notes  °Sickle Cell Patients, [facility] [address], [place] (336)  832-1970   °[facility] [address], [place] (336) 832-4400   °[facility] ° [address], [place] (336) 992-4800   °[facility]/Dr. Osei-Bonsu ° [address] [place] [address], [place] or [address], [place] (336) [phone] Phone number for both [place] and [place] locations is the same.  °[facility] [address], [place] (336) 299-0000   °[facility] [address] [place] [address], [place] or [address] (336) 852-7530 °(336) 878-2260   °[facility] [address],  (336) 350-1642, phone; (336) 294-5005, fax Sees patients 1st and 3rd Saturday of every month.  Must not qualify   for public or private insurance (i.e. Medicaid, Medicare, [place] Health Choice, Veterans' Benefits) • Household income should be no more than 200% of the poverty level •The clinic cannot treat you if you are pregnant or think you are pregnant • Sexually transmitted diseases are not treated at the clinic.  ° ° °Dental Care: °Organization         Address  Phone  Notes  °[facility] [address], [place] (336) 641-6152 Accepts children up to age 21 who are enrolled in Medicaid or [place] Health Choice; pregnant women with a Medicaid card; and children who have applied for Medicaid or [place] Health Choice, but were declined, whose parents can pay a reduced fee at time of service.  °[facility] [place]  [address], [place] (336) 641-7733 Accepts children up to age 21 who are enrolled in Medicaid or [place] Health Choice; pregnant women with a Medicaid card; and children who have applied for Medicaid or [place] Health Choice, but were declined, whose parents can pay a reduced fee at time of service.  °[facility] ° [address], [place] (336) 641-4533 Patients are  seen by appointment only. Walk-ins are not accepted. [facility] will see patients 18 years of age and older. °Monday - Tuesday (8am-5pm) °Most Wednesdays (8:30-5pm) °$30 per visit, cash only  °[facility] ° [address], [place] (336) 641-4533 Patients are seen by appointment only. Walk-ins are not accepted. [facility] will see patients 18 years of age and older. °One Wednesday Evening (Monthly: Volunteer Based).  $30 per visit, cash only  °[facility]  (919) 537-3737 for adults; Children under age 4, call Graduate Pediatric Dentistry at ([phone]) [phone]. Children aged 4-14, please call (919) [phone] to request a pediatric application. ° Dental services are provided in all areas of dental care including fillings, crowns and bridges, complete and partial dentures, implants, gum treatment, root canals, and extractions. Preventive care is also provided. Treatment is provided to both adults and children. °Patients are selected via a lottery and there is often a waiting list. °  °[facility] [address], °[place] ° (336) 763-8833 [email or web address] °  °[facility] [address], [place], [place] (336)723-1848, Ext. 123 Second and Fourth Thursday of each month, opens at 6:30 AM; Clinic ends at 9 AM.  Patients are seen on a first-come first-served basis, and a limited number are seen during each clinic.  ° °[facility] ° [address], [place], [place] (336) 723-7904   Eligibility Requirements °You must have lived in [place], [place], or [place] counties for at least the last three months. °  You cannot be eligible for state or federal sponsored healthcare insurance, including Veterans Administration, Medicaid, or Medicare. °  You generally cannot be eligible for healthcare insurance through your employer.  °  How to apply: °Eligibility screenings are held every Tuesday and Wednesday afternoon from 1:00 pm until 4:00  pm. You do not need an appointment for the interview!  °[facility] [address], [place], [place] 336-631-2330   °[facility] [facility]  336-342-8273   °[place]  336-703-3100   ° [facility]  336-570-6415   ° °Behavioral Health Resources in the Community: °Intensive Outpatient Programs °Organization         Address  Phone    Notes  °[facility] [address], [place], [place] 336-878-6098   °[facility] [address], [place], [place] 336-832-9800   °[facility] [address], [place], [place] ° 336-882-2125   °[facility] [address],  °[place], [place] 1-800-853-5163 or 336-641-4981   °Substance Abuse Resources °Organization         Address  Phone  Notes  °[facility]  336-882-2125   °[facility]  336-784-9470   °[facility]  336-285-9073   °[facility]  336-845-3988   °Residential & Outpatient Substance Abuse Program  1-800-659-3381   °Psychological Services °Organization         Address  Phone  Notes  °[facility]  336- 832-9600   °[facility]  336- 378-7881   °[facility] [address], [place] 1-800-853-5163 or 336-641-4981   ° °Mobile Crisis Teams °Organization         Address  Phone  Notes  °[facility], Mobile Crisis Care Unit  1-877-626-1772   °Assertive °[facility] ° [address] [place], [place] 336-834-9664   °Sharon DeEsch [address] °[place] 336-554-5454   ° °Self-Help/Support Groups °Organization         Address  Phone             Notes  °[facility] [place] - variety of support groups  336- 373-1402 Call for more information  °Narcotics Anonymous (NA), [facility] [address], °[place]  2 meetings at this location  ° °Residential Treatment Programs °Organization          Address  Phone  Notes  °[facility] [address],    °[place]  1-866-801-8205   °[facility] ° [address], [place], [place] 704-293-8524   °[facility] [address], [place] 336-845-3988 Admissions: 8am-3pm M-F  °[facility] [address]    °[place], [place] 336-841-1104   °[facility] [address], [place], [place] 336-379-7146   °[facility] [address]  °[place], [place] 336-285-9073   °[facility] - Intensive Outpatient [address], [place], [place] 336-852-3033   °[facility] [address]  °[place], [place] 1-877-615-2722 or 336-784-9470   °[facility] [address] [place], [place] 336-227-7417 Accepts Medicaid  °[facility] [address]  °[place] 1-800-659-3381 Substance Abuse/Addiction Treatment  ° °[place] Behavioral Health Resources °Organization         Address  Phone  Notes  °[facility]  (888) 581-9988   °Julie Brannon, PhD [address] [place], [place]   (336) 349-5553 or (336) 951-0000   ° Behavioral   [address] °[place], [place] (336) 349-4454   °[facility] [address], [place], [place] (336) 342-8316 Insurance/Medicaid/sponsorship through [facility]  °[facility] [address]                                    [place], [place] (336) 342-8316 Therapy/tele-psych/case  °[facility] [address]  ° [place], [place] (336) 349-2233    °Dr. Arfeen  (336) 349-4544   °[facility] [place]  [employer] [place]   [facility] 1) [address], [place] °2) [address], [place] °3)  [address], [place] (336) 349-3220 °(336) 342-7768 ° °(336) 342-8140   °[place] Child Abuse [facility] (336) 342-1394 or (336) 342-3537 (After Hours)    ° ° ° ° °

## 2014-09-06 ENCOUNTER — Ambulatory Visit (INDEPENDENT_AMBULATORY_CARE_PROVIDER_SITE_OTHER): Payer: PPO | Admitting: *Deleted

## 2014-09-06 DIAGNOSIS — I5022 Chronic systolic (congestive) heart failure: Secondary | ICD-10-CM | POA: Diagnosis not present

## 2014-09-06 DIAGNOSIS — I472 Ventricular tachycardia, unspecified: Secondary | ICD-10-CM

## 2014-09-06 NOTE — Progress Notes (Signed)
Remote ICD transmission.   

## 2014-09-15 LAB — CUP PACEART REMOTE DEVICE CHECK
Battery Voltage: 3.07 V
Brady Statistic AP VP Percent: 15 %
Brady Statistic AS VS Percent: 6.91 %
Brady Statistic RV Percent Paced: 93.05 %
Date Time Interrogation Session: 20160720112509
HIGH POWER IMPEDANCE MEASURED VALUE: 49 Ohm
HighPow Impedance: 43 Ohm
HighPow Impedance: 589 Ohm
Lead Channel Impedance Value: 228 Ohm
Lead Channel Impedance Value: 361 Ohm
Lead Channel Impedance Value: 456 Ohm
Lead Channel Impedance Value: 665 Ohm
Lead Channel Pacing Threshold Amplitude: 0.875 V
Lead Channel Pacing Threshold Pulse Width: 0.4 ms
Lead Channel Sensing Intrinsic Amplitude: 2.25 mV
Lead Channel Sensing Intrinsic Amplitude: 2.25 mV
Lead Channel Setting Pacing Pulse Width: 0.4 ms
Lead Channel Setting Sensing Sensitivity: 0.3 mV
MDC IDC MSMT LEADCHNL LV IMPEDANCE VALUE: 513 Ohm
MDC IDC MSMT LEADCHNL LV PACING THRESHOLD AMPLITUDE: 1.5 V
MDC IDC MSMT LEADCHNL LV PACING THRESHOLD PULSEWIDTH: 0.4 ms
MDC IDC MSMT LEADCHNL RA PACING THRESHOLD PULSEWIDTH: 0.4 ms
MDC IDC MSMT LEADCHNL RV PACING THRESHOLD AMPLITUDE: 0.625 V
MDC IDC MSMT LEADCHNL RV SENSING INTR AMPL: 4 mV
MDC IDC MSMT LEADCHNL RV SENSING INTR AMPL: 4 mV
MDC IDC SET LEADCHNL LV PACING AMPLITUDE: 2.5 V
MDC IDC SET LEADCHNL RA PACING AMPLITUDE: 2 V
MDC IDC SET LEADCHNL RV PACING AMPLITUDE: 2.5 V
MDC IDC SET LEADCHNL RV PACING PULSEWIDTH: 0.4 ms
MDC IDC SET ZONE DETECTION INTERVAL: 350 ms
MDC IDC SET ZONE DETECTION INTERVAL: 450 ms
MDC IDC STAT BRADY AP VS PERCENT: 0.04 %
MDC IDC STAT BRADY AS VP PERCENT: 78.05 %
MDC IDC STAT BRADY RA PERCENT PACED: 15.04 %
Zone Setting Detection Interval: 240 ms
Zone Setting Detection Interval: 300 ms
Zone Setting Detection Interval: 350 ms

## 2014-11-06 ENCOUNTER — Ambulatory Visit (HOSPITAL_COMMUNITY)
Admission: RE | Admit: 2014-11-06 | Discharge: 2014-11-06 | Disposition: A | Discharge status: Home / Self Care | Payer: PPO | Source: Ambulatory Visit | Attending: Nurse Practitioner | Admitting: Nurse Practitioner

## 2014-11-06 ENCOUNTER — Encounter (HOSPITAL_COMMUNITY): Payer: Self-pay | Admitting: Nurse Practitioner

## 2014-11-06 VITALS — BP 124/76 | HR 76 | Ht 73.0 in | Wt 227.2 lb

## 2014-11-06 DIAGNOSIS — E039 Hypothyroidism, unspecified: Secondary | ICD-10-CM | POA: Insufficient documentation

## 2014-11-06 DIAGNOSIS — I2510 Atherosclerotic heart disease of native coronary artery without angina pectoris: Secondary | ICD-10-CM | POA: Diagnosis not present

## 2014-11-06 DIAGNOSIS — I480 Paroxysmal atrial fibrillation: Secondary | ICD-10-CM | POA: Insufficient documentation

## 2014-11-06 DIAGNOSIS — I129 Hypertensive chronic kidney disease with stage 1 through stage 4 chronic kidney disease, or unspecified chronic kidney disease: Secondary | ICD-10-CM | POA: Insufficient documentation

## 2014-11-06 DIAGNOSIS — Z95810 Presence of automatic (implantable) cardiac defibrillator: Secondary | ICD-10-CM | POA: Diagnosis not present

## 2014-11-06 DIAGNOSIS — Z87891 Personal history of nicotine dependence: Secondary | ICD-10-CM | POA: Diagnosis not present

## 2014-11-06 DIAGNOSIS — N189 Chronic kidney disease, unspecified: Secondary | ICD-10-CM | POA: Insufficient documentation

## 2014-11-06 DIAGNOSIS — Z7982 Long term (current) use of aspirin: Secondary | ICD-10-CM | POA: Insufficient documentation

## 2014-11-06 DIAGNOSIS — Z882 Allergy status to sulfonamides status: Secondary | ICD-10-CM | POA: Diagnosis not present

## 2014-11-06 DIAGNOSIS — Z8249 Family history of ischemic heart disease and other diseases of the circulatory system: Secondary | ICD-10-CM | POA: Insufficient documentation

## 2014-11-06 DIAGNOSIS — I5022 Chronic systolic (congestive) heart failure: Secondary | ICD-10-CM | POA: Insufficient documentation

## 2014-11-06 DIAGNOSIS — Z8673 Personal history of transient ischemic attack (TIA), and cerebral infarction without residual deficits: Secondary | ICD-10-CM | POA: Diagnosis not present

## 2014-11-06 DIAGNOSIS — I4891 Unspecified atrial fibrillation: Secondary | ICD-10-CM | POA: Diagnosis present

## 2014-11-06 DIAGNOSIS — Z79899 Other long term (current) drug therapy: Secondary | ICD-10-CM | POA: Diagnosis not present

## 2014-11-06 DIAGNOSIS — I429 Cardiomyopathy, unspecified: Secondary | ICD-10-CM | POA: Insufficient documentation

## 2014-11-06 NOTE — Progress Notes (Signed)
Patient ID: Don [name], male   DOB: 1945-03-30, 69 y.o.   MRN: [id number]     Primary Care Physician: [name], MD Referring Physician: Dr. Winn Jock is a 69 y.o. male with a h/o history of nonischemic cardiomyopathy, chronic systolic heart failure, left bundle branch block, status post biventricular ICD implantation. He is in the afib clinic today referred by Dr. Ladona Ridgel  after he was found to have 11 mins of afib on a recent remote device report. He is asymptomatic. He states he may have heard a provider say years ago that he may have had afib at some point. He has a chadsvasc of at least 5. He does drink alcohol, 1/2 of a fifth, a day, he sips it continuously during the day. He states he is bored and that is why he drinks. Long discussion re blood thinners and alcohol use and increase risk of bleeding. He states that he can give alcohol up, no problem. Per Dr. Ladona Ridgel consider for coumadin use. Chart states long history of alcohol abuse.He does wish to start coumadin. He also adds that he was on plavix befor after a TIA but he stopped it due to bruising. He currently has many bruises on forearms. He cannot afford brand blood thinners,  DOAC's would be too expensive. He will need generic with limited funds.  Today, he denies symptoms of palpitations, chest pain, shortness of breath, orthopnea, PND, lower extremity edema, dizziness, presyncope, syncope, or neurologic sequela. The patient is tolerating medications without difficulties and is otherwise without complaint today.   Past Medical History  Diagnosis Date  . Nonischemic cardiomyopathy     EF 20% s/p INSERTION OF BI-V DEFIBRILLATOR.  . Chronic systolic heart failure     LEFT  . LBBB (left bundle branch block)   . Alcohol abuse     PREVIOUS H/O DRINKING A FIFTH OF VODKA  A DAY  . History of angina   . Stroke     TIA's  . Thyroid disease     HYPOTHYROIDISM.....TREATED  . ICD (implantable  cardiac defibrillator) in place   . CHF (congestive heart failure)   . Anginal pain   . Chronic kidney disease     acute renal  04/2012  . Arthritis     hands & back  . Coronary artery disease   . TIA (transient ischemic attack)   . Hypertension    Past Surgical History  Procedure Laterality Date  . Insert / replace / remove pacemaker      BI-V IMPLANTABLE CARDIOVERTER-DEFIBRILLATOR. ; [employer] CONCERTO; MODEL  # [id number], SERIAL # [id number] H.  DR. Humberto Leep EDMUNDS.  Marland Kitchen Cardiac catheterization    . Tonsillectomy    . Cystoscopy w/ stone manipulation    . Vasectomy    . Flexible sigmoidoscopy N/A 04/29/2012    Procedure: FLEXIBLE SIGMOIDOSCOPY;  Surgeon: Vertell Novak., MD;  Location: [facility] ENDOSCOPY;  Service: Endoscopy;  Laterality: N/A;  . Implantable cardioverter defibrillator (icd) generator change Bilateral 12/25/2011    Procedure: ICD GENERATOR CHANGE;  Surgeon: Marinus Maw, MD;  Location: [facility] CATH LAB;  Service: Cardiovascular;  Laterality: Bilateral;    Current Outpatient Prescriptions  Medication Sig Dispense Refill  . allopurinol (ZYLOPRIM) 100 MG tablet Take 100 mg by mouth daily.     Marland Kitchen aspirin 81 MG tablet Take 81 mg by mouth daily.    Marland Kitchen atorvastatin (LIPITOR) 40 MG tablet Take 40 mg by mouth daily.     Marland Kitchen  benazepril (LOTENSIN) 10 MG tablet Take 10 mg by mouth daily.    . calcium carbonate (TUMS - DOSED IN MG ELEMENTAL CALCIUM) 500 MG chewable tablet Chew 1 tablet by mouth daily.    . carvedilol (COREG) 6.25 MG tablet Take 6.25 mg by mouth 2 (two) times daily with a meal.    . Cholecalciferol 4000 UNITS CAPS Take 1 capsule by mouth daily.    . furosemide (LASIX) 20 MG tablet Take 20 mg by mouth daily.    Marland Kitchen levothyroxine (SYNTHROID, LEVOTHROID) 100 MCG tablet Take 100 mcg by mouth daily before breakfast.    . OVER THE COUNTER MEDICATION Take 1 tablet by mouth 2 (two) times daily. Multi-allergy tablet    . nitroGLYCERIN (NITROSTAT) 0.4 MG SL tablet Place 0.4 mg under the  tongue every 5 (five) minutes as needed for chest pain.      No current facility-administered medications for this encounter.    Allergies  Allergen Reactions  . Sulfa Antibiotics Nausea And Vomiting    Social History   Social History  . Marital Status: Widowed    Spouse Name: N/A  . Number of Children: N/A  . Years of Education: N/A   Occupational History  . RETIRED     SOCIAL WORKER   Social History Main Topics  . Smoking status: Former Smoker -- 43 years    Quit date: 02/27/2012  . Smokeless tobacco: Never Used  . Alcohol Use: No     Comment: H/O ALCOHOLISM PERVIOUSLY A FIFTH OF VODKA A DAY  . Drug Use: No  . Sexual Activity: Not on file   Other Topics Concern  . Not on file   Social History Narrative   BI-V IMPLANTABLE CARDIOVERTER-DEFIBRILLATOR; [employer] CONCERTO, MODEL # [id number]; SERIAL # [id number] H;       RETIRED SOCIAL WORKER      MARRIED 4 TIMES      H/O ALCOHOLISM; A FIFTH OF VODKA DAILY IN THE PAST             Family History  Problem Relation Age of Onset  . Heart disease Mother   . Heart disease Father   . Cancer    . Hypertension      ROS- All systems are reviewed and negative except as per the HPI above  Physical Exam: Filed Vitals:   11/06/14 1043  BP: 124/76  Pulse: 76  Height:  (1.854 m)  Weight: 227 lb 3.2 oz (103.057 kg)    GEN- The patient is well appearing, alert and oriented x 3 today.   Head- normocephalic, atraumatic Eyes-  Sclera clear, conjunctiva pink Ears- hearing intact Oropharynx- clear Neck- supple, no JVP Lymph- no cervical lymphadenopathy Lungs- Clear to ausculation bilaterally, normal work of breathing Heart- Regular rate and rhythm, no murmurs, rubs or gallops, PMI not laterally displaced GI- soft, NT, ND, + BS Extremities- no clubbing, cyanosis, or edema MS- no significant deformity or atrophy Skin- no rash or lesion Psych- euthymic mood, full affect Neuro- strength and sensation are  intact  EKG- Ventricular paced rhythm at 76 bpm, QRS int 106 ms, QTc 445 ms. Epic records reviewed. Per verbal report of device clinic, of afib noted on remote report.  Assessment and Plan: 1. PAF Asymptomatic with brief run of afib but with  chadsvasc score of at least 5 and need for anticoagulation. Pt however is at risk of bleeding with alcohol abuse. States he will stop drinking. Will refer to coumadin clinic for further education of  bleeding risk/coumadin use, compliance  that is expected and required to acheive therapeutic goals. This was discussed with pt and he states that he can give up alcohol.  Anticipate stopping of asa with start of warfarin  F/u per requested coumadin clinic appointment that has been requested.  Elvina Sidle [facility] [address] [place], [place] [address] [phone]

## 2014-11-15 ENCOUNTER — Encounter: Payer: Self-pay | Admitting: Internal Medicine

## 2014-12-11 ENCOUNTER — Ambulatory Visit (INDEPENDENT_AMBULATORY_CARE_PROVIDER_SITE_OTHER): Payer: PPO | Admitting: *Deleted

## 2014-12-11 DIAGNOSIS — I472 Ventricular tachycardia, unspecified: Secondary | ICD-10-CM

## 2014-12-11 DIAGNOSIS — I5022 Chronic systolic (congestive) heart failure: Secondary | ICD-10-CM

## 2014-12-11 NOTE — Progress Notes (Signed)
Remote ICD transmission.   

## 2014-12-12 LAB — CUP PACEART REMOTE DEVICE CHECK
Brady Statistic RA Percent Paced: 18.46 %
Brady Statistic RV Percent Paced: 92.31 %
Date Time Interrogation Session: 20161024062608
HIGH POWER IMPEDANCE MEASURED VALUE: 56 Ohm
HighPow Impedance: 46 Ohm
HighPow Impedance: 608 Ohm
Implantable Lead Implant Date: 20080710
Implantable Lead Implant Date: 20080710
Implantable Lead Location: 753858
Implantable Lead Location: 753859
Implantable Lead Model: 5076
Implantable Lead Model: 6947
Lead Channel Impedance Value: 456 Ohm
Lead Channel Impedance Value: 589 Ohm
Lead Channel Impedance Value: 665 Ohm
Lead Channel Pacing Threshold Amplitude: 0.625 V
Lead Channel Pacing Threshold Amplitude: 0.875 V
Lead Channel Pacing Threshold Pulse Width: 0.4 ms
Lead Channel Sensing Intrinsic Amplitude: 2.5 mV
Lead Channel Sensing Intrinsic Amplitude: 2.5 mV
Lead Channel Sensing Intrinsic Amplitude: 5.625 mV
Lead Channel Setting Pacing Amplitude: 2 V
Lead Channel Setting Pacing Amplitude: 2.25 V
Lead Channel Setting Pacing Pulse Width: 0.4 ms
MDC IDC LEAD IMPLANT DT: 20080710
MDC IDC LEAD LOCATION: 753860
MDC IDC LEAD MODEL: 4194
MDC IDC MSMT BATTERY VOLTAGE: 3.06 V
MDC IDC MSMT LEADCHNL LV IMPEDANCE VALUE: 247 Ohm
MDC IDC MSMT LEADCHNL LV IMPEDANCE VALUE: 475 Ohm
MDC IDC MSMT LEADCHNL LV PACING THRESHOLD AMPLITUDE: 1.125 V
MDC IDC MSMT LEADCHNL RA PACING THRESHOLD PULSEWIDTH: 0.4 ms
MDC IDC MSMT LEADCHNL RV PACING THRESHOLD PULSEWIDTH: 0.4 ms
MDC IDC MSMT LEADCHNL RV SENSING INTR AMPL: 5.625 mV
MDC IDC SET LEADCHNL RV PACING AMPLITUDE: 2.5 V
MDC IDC SET LEADCHNL RV PACING PULSEWIDTH: 0.4 ms
MDC IDC SET LEADCHNL RV SENSING SENSITIVITY: 0.3 mV
MDC IDC STAT BRADY AP VP PERCENT: 18.42 %
MDC IDC STAT BRADY AP VS PERCENT: 0.04 %
MDC IDC STAT BRADY AS VP PERCENT: 73.89 %
MDC IDC STAT BRADY AS VS PERCENT: 7.65 %

## 2014-12-15 ENCOUNTER — Encounter: Payer: Self-pay | Admitting: Cardiology

## 2015-03-12 ENCOUNTER — Ambulatory Visit (INDEPENDENT_AMBULATORY_CARE_PROVIDER_SITE_OTHER): Payer: PPO | Admitting: *Deleted

## 2015-03-12 DIAGNOSIS — I472 Ventricular tachycardia, unspecified: Secondary | ICD-10-CM

## 2015-03-12 DIAGNOSIS — I5022 Chronic systolic (congestive) heart failure: Secondary | ICD-10-CM

## 2015-03-13 NOTE — Progress Notes (Signed)
Remote ICD transmission.   

## 2015-03-14 LAB — CUP PACEART REMOTE DEVICE CHECK
Battery Voltage: 3.05 V
Brady Statistic AP VP Percent: 9.29 %
Brady Statistic AP VS Percent: 0.03 %
Brady Statistic RA Percent Paced: 9.31 %
Brady Statistic RV Percent Paced: 93.01 %
Date Time Interrogation Session: 20170123083328
HIGH POWER IMPEDANCE MEASURED VALUE: 49 Ohm
HighPow Impedance: 43 Ohm
HighPow Impedance: 532 Ohm
Implantable Lead Implant Date: 20080710
Implantable Lead Implant Date: 20080710
Implantable Lead Model: 5076
Lead Channel Impedance Value: 418 Ohm
Lead Channel Impedance Value: 456 Ohm
Lead Channel Impedance Value: 646 Ohm
Lead Channel Pacing Threshold Amplitude: 0.625 V
Lead Channel Pacing Threshold Amplitude: 1 V
Lead Channel Pacing Threshold Pulse Width: 0.4 ms
Lead Channel Pacing Threshold Pulse Width: 0.4 ms
Lead Channel Pacing Threshold Pulse Width: 0.4 ms
Lead Channel Sensing Intrinsic Amplitude: 1.875 mV
Lead Channel Sensing Intrinsic Amplitude: 3.25 mV
Lead Channel Sensing Intrinsic Amplitude: 3.25 mV
Lead Channel Setting Pacing Amplitude: 2 V
Lead Channel Setting Pacing Amplitude: 2.25 V
Lead Channel Setting Pacing Amplitude: 2.5 V
Lead Channel Setting Pacing Pulse Width: 0.4 ms
Lead Channel Setting Sensing Sensitivity: 0.3 mV
MDC IDC LEAD IMPLANT DT: 20080710
MDC IDC LEAD LOCATION: 753858
MDC IDC LEAD LOCATION: 753859
MDC IDC LEAD LOCATION: 753860
MDC IDC LEAD MODEL: 4194
MDC IDC MSMT LEADCHNL LV IMPEDANCE VALUE: 285 Ohm
MDC IDC MSMT LEADCHNL LV IMPEDANCE VALUE: 551 Ohm
MDC IDC MSMT LEADCHNL LV PACING THRESHOLD AMPLITUDE: 1.125 V
MDC IDC MSMT LEADCHNL RA SENSING INTR AMPL: 1.875 mV
MDC IDC SET LEADCHNL RV PACING PULSEWIDTH: 0.4 ms
MDC IDC STAT BRADY AS VP PERCENT: 83.72 %
MDC IDC STAT BRADY AS VS PERCENT: 6.97 %

## 2015-03-21 ENCOUNTER — Encounter: Payer: Self-pay | Admitting: Cardiology

## 2015-06-28 DIAGNOSIS — Z8673 Personal history of transient ischemic attack (TIA), and cerebral infarction without residual deficits: Secondary | ICD-10-CM | POA: Diagnosis not present

## 2015-06-28 DIAGNOSIS — Z87448 Personal history of other diseases of urinary system: Secondary | ICD-10-CM | POA: Diagnosis not present

## 2015-06-28 DIAGNOSIS — E039 Hypothyroidism, unspecified: Secondary | ICD-10-CM | POA: Diagnosis not present

## 2015-06-28 DIAGNOSIS — R238 Other skin changes: Secondary | ICD-10-CM | POA: Diagnosis not present

## 2015-06-28 DIAGNOSIS — E782 Mixed hyperlipidemia: Secondary | ICD-10-CM | POA: Diagnosis not present

## 2015-06-28 DIAGNOSIS — I2510 Atherosclerotic heart disease of native coronary artery without angina pectoris: Secondary | ICD-10-CM | POA: Diagnosis not present

## 2015-06-28 DIAGNOSIS — Z87898 Personal history of other specified conditions: Secondary | ICD-10-CM | POA: Diagnosis not present

## 2015-06-28 DIAGNOSIS — I10 Essential (primary) hypertension: Secondary | ICD-10-CM | POA: Diagnosis not present

## 2015-06-28 DIAGNOSIS — Z8679 Personal history of other diseases of the circulatory system: Secondary | ICD-10-CM | POA: Diagnosis not present

## 2015-06-28 DIAGNOSIS — Z79899 Other long term (current) drug therapy: Secondary | ICD-10-CM | POA: Diagnosis not present

## 2015-06-28 DIAGNOSIS — R0789 Other chest pain: Secondary | ICD-10-CM | POA: Diagnosis not present

## 2015-07-24 DIAGNOSIS — M25511 Pain in right shoulder: Secondary | ICD-10-CM | POA: Diagnosis not present

## 2015-07-25 ENCOUNTER — Ambulatory Visit (INDEPENDENT_AMBULATORY_CARE_PROVIDER_SITE_OTHER): Payer: PPO | Admitting: Internal Medicine

## 2015-07-25 ENCOUNTER — Encounter: Payer: Self-pay | Admitting: Internal Medicine

## 2015-07-25 VITALS — BP 130/90 | HR 72 | Ht 73.0 in | Wt 226.0 lb

## 2015-07-25 DIAGNOSIS — Z95810 Presence of automatic (implantable) cardiac defibrillator: Secondary | ICD-10-CM | POA: Diagnosis not present

## 2015-07-25 DIAGNOSIS — I5022 Chronic systolic (congestive) heart failure: Secondary | ICD-10-CM | POA: Diagnosis not present

## 2015-07-25 NOTE — Patient Instructions (Signed)
Medication Instructions:  Your physician recommends that you continue on your current medications as directed. Please refer to the Current Medication list given to you today.   Labwork: None ordered   Testing/Procedures: None ordered   Follow-Up: Your physician wants you to follow-up in: 12 months with Dr Taylor You will receive a reminder letter in the mail two months in advance. If you don't receive a letter, please call our office to schedule the follow-up appointment.  Remote monitoring is used to monitor your  ICD from home. This monitoring reduces the number of office visits required to check your device to one time per year. It allows us to keep an eye on the functioning of your device to ensure it is working properly. You are scheduled for a device check from home on 10/24/15. You may send your transmission at any time that day. If you have a wireless device, the transmission will be sent automatically. After your physician reviews your transmission, you will receive a postcard with your next transmission date.     Any Other Special Instructions Will Be Listed Below (If Applicable).     If you need a refill on your cardiac medications before your next appointment, please call your pharmacy.   

## 2015-07-25 NOTE — Progress Notes (Signed)
HPI Don [name] returns today for followup. He is a very pleasant 70 year old man with a history of nonischemic cardiomyopathy, chronic systolic heart failure, left bundle branch block, status post biventricular ICD implantation. The patient has a history of alcohol abuse, but denies drinking lately. He denies syncope. He denies ICD shock, chest pain, or worsening shortness of breath. His heart failure symptoms are class II. Several months ago, he was exercising regularly, but has not done so recently. He has no peripheral edema. Allergies  Allergen Reactions  . Sulfa Antibiotics Nausea And Vomiting     Current Outpatient Prescriptions  Medication Sig Dispense Refill  . allopurinol (ZYLOPRIM) 100 MG tablet Take 100 mg by mouth daily.     Marland Kitchen aspirin 81 MG tablet Take 81 mg by mouth daily.    Marland Kitchen atorvastatin (LIPITOR) 40 MG tablet Take 40 mg by mouth daily.     . benazepril (LOTENSIN) 10 MG tablet Take 10 mg by mouth daily.    . calcium carbonate (TUMS - DOSED IN MG ELEMENTAL CALCIUM) 500 MG chewable tablet Chew 1 tablet by mouth daily.    . carvedilol (COREG) 6.25 MG tablet Take 6.25 mg by mouth 2 (two) times daily with a meal.    . Cholecalciferol 4000 UNITS CAPS Take 1 capsule by mouth daily.    . furosemide (LASIX) 20 MG tablet Take 20 mg by mouth daily.    Marland Kitchen levothyroxine (SYNTHROID, LEVOTHROID) 100 MCG tablet Take 100 mcg by mouth daily before breakfast.    . nitroGLYCERIN (NITROSTAT) 0.4 MG SL tablet Place 0.4 mg under the tongue every 5 (five) minutes as needed for chest pain.     Marland Kitchen OVER THE COUNTER MEDICATION Take 1 tablet by mouth 2 (two) times daily. Multi-allergy tablet     No current facility-administered medications for this visit.     Past Medical History  Diagnosis Date  . Nonischemic cardiomyopathy (HCC)     EF 20% s/p INSERTION OF BI-V DEFIBRILLATOR.  . Chronic systolic heart failure (HCC)     LEFT  . LBBB (left bundle branch block)   . Alcohol abuse    PREVIOUS H/O DRINKING A FIFTH OF VODKA  A DAY  . History of angina   . Stroke (HCC)     TIA's  . Thyroid disease     HYPOTHYROIDISM.....TREATED  . ICD (implantable cardiac defibrillator) in place   . CHF (congestive heart failure) (HCC)   . Anginal pain (HCC)   . Chronic kidney disease     acute renal  04/2012  . Arthritis     hands & back  . Coronary artery disease   . TIA (transient ischemic attack)   . Hypertension     ROS:   All systems reviewed and negative except as noted in the HPI.   Past Surgical History  Procedure Laterality Date  . Insert / replace / remove pacemaker      BI-V IMPLANTABLE CARDIOVERTER-DEFIBRILLATOR. ; [employer] CONCERTO; MODEL  # [id number], SERIAL # [id number] H.  DR. Humberto Leep EDMUNDS.  Marland Kitchen Cardiac catheterization    . Tonsillectomy    . Cystoscopy w/ stone manipulation    . Vasectomy    . Flexible sigmoidoscopy N/A 04/29/2012    Procedure: FLEXIBLE SIGMOIDOSCOPY;  Surgeon: Vertell Novak., MD;  Location: [facility] ENDOSCOPY;  Service: Endoscopy;  Laterality: N/A;  . Implantable cardioverter defibrillator (icd) generator change Bilateral 12/25/2011    Procedure: ICD GENERATOR CHANGE;  Surgeon: Marinus Maw, MD;  Location: [facility] CATH LAB;  Service: Cardiovascular;  Laterality: Bilateral;     Family History  Problem Relation Age of Onset  . Heart disease Mother   . Heart disease Father   . Cancer    . Hypertension       Social History   Social History  . Marital Status: Widowed    Spouse Name: N/A  . Number of Children: N/A  . Years of Education: N/A   Occupational History  . RETIRED     SOCIAL WORKER   Social History Main Topics  . Smoking status: Current Every Day Smoker -- 43 years    Last Attempt to Quit: 02/27/2012  . Smokeless tobacco: Never Used  . Alcohol Use: No     Comment: H/O ALCOHOLISM PERVIOUSLY A FIFTH OF VODKA A DAY  . Drug Use: No  . Sexual Activity: Not on file   Other Topics Concern  . Not on file   Social History  Narrative   BI-V IMPLANTABLE CARDIOVERTER-DEFIBRILLATOR; [employer], MODEL # [id number]; SERIAL # [id number] H;       RETIRED SOCIAL WORKER      MARRIED 4 TIMES      H/O ALCOHOLISM; A FIFTH OF VODKA DAILY IN THE PAST              BP 130/90 mmHg  Pulse 72  Ht 6\' 1"  (1.854 m)  Wt 226 lb (102.513 kg)  BMI 29.82 kg/m2  Physical Exam:  Well appearing 70 year old man, NAD HEENT: Unremarkable Neck:  6 cm JVD, no thyromegally Back:  No CVA tenderness Lungs:  Clear with no wheezes, rales, or rhonchi. HEART:  Regular rate rhythm, no murmurs, no rubs, no clicks Abd:  soft, positive bowel sounds, no organomegally, no rebound, no guarding Ext:  2 plus pulses, no edema, no cyanosis, no clubbing Skin:  No rashes no nodules Neuro:  CN II through XII intact, motor grossly intact   DEVICE  Normal device function.  See PaceArt for details.   Assess/Plan: 1. Chronic systolic heart failure - his symptoms are class 2. I have encouraged the patient to increase his physical activity. 2. VT - he has had no recurrent symptoms and no ICD shocks. 3. ICD - his Medtronic BiV ICD is working normally. Will recheck in several months.  Don [name],M.D.      HPI Don [name] returns today for followup. He is a very pleasant 70 year old man with a history of nonischemic cardiomyopathy, chronic systolic heart failure, left bundle branch block, status post biventricular ICD implantation. The patient has a history of alcohol abuse, and admits to return to drinking, as he has been depressed. He denies syncope. He denies ICD shock, chest pain, or worsening shortness of breath. His heart failure symptoms are class II. Several months ago, he was exercising regularly, but has not done so recently. He has had mild peripheral edema. Allergies  Allergen Reactions  . Sulfa Antibiotics Nausea And Vomiting     Current Outpatient Prescriptions  Medication Sig Dispense Refill  . allopurinol (ZYLOPRIM)  100 MG tablet Take 100 mg by mouth daily.     Marland Kitchen aspirin 81 MG tablet Take 81 mg by mouth daily.    Marland Kitchen atorvastatin (LIPITOR) 40 MG tablet Take 40 mg by mouth daily.     . benazepril (LOTENSIN) 10 MG tablet Take 10 mg by mouth daily.    . calcium carbonate (TUMS - DOSED IN MG ELEMENTAL CALCIUM) 500 MG chewable tablet Chew 1 tablet by mouth daily.    Marland Kitchen  carvedilol (COREG) 6.25 MG tablet Take 6.25 mg by mouth 2 (two) times daily with a meal.    . Cholecalciferol 4000 UNITS CAPS Take 1 capsule by mouth daily.    . furosemide (LASIX) 20 MG tablet Take 20 mg by mouth daily.    Marland Kitchen levothyroxine (SYNTHROID, LEVOTHROID) 100 MCG tablet Take 100 mcg by mouth daily before breakfast.    . nitroGLYCERIN (NITROSTAT) 0.4 MG SL tablet Place 0.4 mg under the tongue every 5 (five) minutes as needed for chest pain.     Marland Kitchen OVER THE COUNTER MEDICATION Take 1 tablet by mouth 2 (two) times daily. Multi-allergy tablet     No current facility-administered medications for this visit.     Past Medical History  Diagnosis Date  . Nonischemic cardiomyopathy (HCC)     EF 20% s/p INSERTION OF BI-V DEFIBRILLATOR.  . Chronic systolic heart failure (HCC)     LEFT  . LBBB (left bundle branch block)   . Alcohol abuse     PREVIOUS H/O DRINKING A FIFTH OF VODKA  A DAY  . History of angina   . Stroke (HCC)     TIA's  . Thyroid disease     HYPOTHYROIDISM.....TREATED  . ICD (implantable cardiac defibrillator) in place   . CHF (congestive heart failure) (HCC)   . Anginal pain (HCC)   . Chronic kidney disease     acute renal  04/2012  . Arthritis     hands & back  . Coronary artery disease   . TIA (transient ischemic attack)   . Hypertension     ROS:   All systems reviewed and negative except as noted in the HPI.   Past Surgical History  Procedure Laterality Date  . Insert / replace / remove pacemaker      BI-V IMPLANTABLE CARDIOVERTER-DEFIBRILLATOR. ; [employer]CERT[employer]; MODEL  # [id number], SERIAL # [id number] H.  DR.  Humberto Leep EDMUNDS.  Marland Kitchen Cardiac catheterization    . Tonsillectomy    . Cystoscopy w/ stone manipulation    . Vasectomy    . Flexible sigmoidoscopy N/A 04/29/2012    Procedure: FLEXIBLE SIGMOIDOSCOPY;  Surgeon: Vertell Novak., MD;  Location: [facility] ENDOSCOPY;  Service: Endoscopy;  Laterality: N/A;  . Implantable cardioverter defibrillator (icd) generator change Bilateral 12/25/2011    Procedure: ICD GENERATOR CHANGE;  Surgeon: Marinus Maw, MD;  Location: [facility] CATH LAB;  Service: Cardiovascular;  Laterality: Bilateral;     Family History  Problem Relation Age of Onset  . Heart disease Mother   . Heart disease Father   . Cancer    . Hypertension       Social History   Social History  . Marital Status: Widowed    Spouse Name: N/A  . Number of Children: N/A  . Years of Education: N/A   Occupational History  . RETIRED     SOCIAL WORKER   Social History Main Topics  . Smoking status: Current Every Day Smoker -- 43 years    Last Attempt to Quit: 02/27/2012  . Smokeless tobacco: Never Used  . Alcohol Use: No     Comment: H/O ALCOHOLISM PERVIOUSLY A FIFTH OF VODKA A DAY  . Drug Use: No  . Sexual Activity: Not on file   Other Topics Concern  . Not on file   Social History Narrative   BI-V IMPLANTABLE CARDIOVERTER-DEFIBRILLATOR; [employer], MODEL # [id number]; SERIAL # [id number] H;       RETIRED SOCIAL WORKER  MARRIED 4 TIMES      H/O ALCOHOLISM; A FIFTH OF VODKA DAILY IN THE PAST              BP 130/90 mmHg  Pulse 72  Ht 6\' 1"  (1.854 m)  Wt 226 lb (102.513 kg)  BMI 29.82 kg/m2  Physical Exam:  Well appearing 70 year old man, NAD HEENT: Unremarkable Neck:  6 cm JVD, no thyromegally Back:  No CVA tenderness Lungs:  Clear with no wheezes, rales, or rhonchi. HEART:  Regular rate rhythm, no murmurs, no rubs, no clicks Abd:  soft, positive bowel sounds, no organomegally, no rebound, no guarding Ext:  2 plus pulses, no edema, no cyanosis, no  clubbing Skin:  No rashes no nodules Neuro:  CN II through XII intact, motor grossly intact  ECG - nsr with PVC's.  DEVICE  Normal device function.  See PaceArt for details.   Assess/Plan:

## 2015-07-26 LAB — CUP PACEART INCLINIC DEVICE CHECK
Battery Voltage: 3.01 V
Brady Statistic AP VP Percent: 13.01 %
Brady Statistic AP VS Percent: 0.03 %
Brady Statistic AS VP Percent: 80.28 %
Brady Statistic AS VS Percent: 6.68 %
Brady Statistic RA Percent Paced: 13.04 %
Brady Statistic RV Percent Paced: 93.29 %
Date Time Interrogation Session: 20170607162950
HighPow Impedance: 46 Ohm
HighPow Impedance: 55 Ohm
HighPow Impedance: 779 Ohm
Implantable Lead Implant Date: 20080710
Implantable Lead Implant Date: 20080710
Implantable Lead Implant Date: 20080710
Implantable Lead Location: 753858
Implantable Lead Location: 753859
Implantable Lead Location: 753860
Implantable Lead Model: 4194
Implantable Lead Model: 5076
Implantable Lead Model: 6947
Lead Channel Impedance Value: 247 Ohm
Lead Channel Impedance Value: 399 Ohm
Lead Channel Impedance Value: 456 Ohm
Lead Channel Impedance Value: 513 Ohm
Lead Channel Impedance Value: 874 Ohm
Lead Channel Pacing Threshold Amplitude: 0.5 V
Lead Channel Pacing Threshold Amplitude: 0.875 V
Lead Channel Pacing Threshold Amplitude: 1.375 V
Lead Channel Pacing Threshold Pulse Width: 0.4 ms
Lead Channel Pacing Threshold Pulse Width: 0.4 ms
Lead Channel Pacing Threshold Pulse Width: 0.4 ms
Lead Channel Sensing Intrinsic Amplitude: 3.125 mV
Lead Channel Sensing Intrinsic Amplitude: 5.125 mV
Lead Channel Setting Pacing Amplitude: 2 V
Lead Channel Setting Pacing Amplitude: 2.5 V
Lead Channel Setting Pacing Amplitude: 2.5 V
Lead Channel Setting Pacing Pulse Width: 0.4 ms
Lead Channel Setting Pacing Pulse Width: 0.4 ms
Lead Channel Setting Sensing Sensitivity: 0.3 mV

## 2015-09-18 ENCOUNTER — Observation Stay (HOSPITAL_COMMUNITY)
Admission: EM | Admit: 2015-09-18 | Discharge: 2015-09-20 | Disposition: A | Discharge status: Home / Self Care | Payer: PPO | Attending: Internal Medicine | Admitting: Internal Medicine

## 2015-09-18 ENCOUNTER — Encounter (HOSPITAL_COMMUNITY): Payer: Self-pay | Admitting: *Deleted

## 2015-09-18 ENCOUNTER — Emergency Department (HOSPITAL_COMMUNITY): Payer: PPO

## 2015-09-18 ENCOUNTER — Observation Stay (HOSPITAL_COMMUNITY): Payer: PPO

## 2015-09-18 DIAGNOSIS — E890 Postprocedural hypothyroidism: Secondary | ICD-10-CM | POA: Diagnosis not present

## 2015-09-18 DIAGNOSIS — E878 Other disorders of electrolyte and fluid balance, not elsewhere classified: Secondary | ICD-10-CM | POA: Diagnosis not present

## 2015-09-18 DIAGNOSIS — M1389 Other specified arthritis, multiple sites: Secondary | ICD-10-CM | POA: Diagnosis not present

## 2015-09-18 DIAGNOSIS — E785 Hyperlipidemia, unspecified: Secondary | ICD-10-CM | POA: Diagnosis not present

## 2015-09-18 DIAGNOSIS — E876 Hypokalemia: Secondary | ICD-10-CM | POA: Diagnosis not present

## 2015-09-18 DIAGNOSIS — I447 Left bundle-branch block, unspecified: Secondary | ICD-10-CM | POA: Insufficient documentation

## 2015-09-18 DIAGNOSIS — M109 Gout, unspecified: Secondary | ICD-10-CM | POA: Insufficient documentation

## 2015-09-18 DIAGNOSIS — I10 Essential (primary) hypertension: Secondary | ICD-10-CM

## 2015-09-18 DIAGNOSIS — F1010 Alcohol abuse, uncomplicated: Secondary | ICD-10-CM | POA: Diagnosis not present

## 2015-09-18 DIAGNOSIS — E871 Hypo-osmolality and hyponatremia: Secondary | ICD-10-CM | POA: Diagnosis not present

## 2015-09-18 DIAGNOSIS — M419 Scoliosis, unspecified: Secondary | ICD-10-CM | POA: Insufficient documentation

## 2015-09-18 DIAGNOSIS — F17210 Nicotine dependence, cigarettes, uncomplicated: Secondary | ICD-10-CM | POA: Diagnosis not present

## 2015-09-18 DIAGNOSIS — I5042 Chronic combined systolic (congestive) and diastolic (congestive) heart failure: Secondary | ICD-10-CM | POA: Insufficient documentation

## 2015-09-18 DIAGNOSIS — I6389 Other cerebral infarction: Secondary | ICD-10-CM

## 2015-09-18 DIAGNOSIS — Z95810 Presence of automatic (implantable) cardiac defibrillator: Secondary | ICD-10-CM | POA: Diagnosis not present

## 2015-09-18 DIAGNOSIS — R29810 Facial weakness: Secondary | ICD-10-CM | POA: Diagnosis not present

## 2015-09-18 DIAGNOSIS — H0520 Unspecified exophthalmos: Secondary | ICD-10-CM | POA: Insufficient documentation

## 2015-09-18 DIAGNOSIS — I6523 Occlusion and stenosis of bilateral carotid arteries: Secondary | ICD-10-CM | POA: Diagnosis not present

## 2015-09-18 DIAGNOSIS — I255 Ischemic cardiomyopathy: Secondary | ICD-10-CM | POA: Insufficient documentation

## 2015-09-18 DIAGNOSIS — Z8673 Personal history of transient ischemic attack (TIA), and cerebral infarction without residual deficits: Secondary | ICD-10-CM | POA: Insufficient documentation

## 2015-09-18 DIAGNOSIS — N189 Chronic kidney disease, unspecified: Secondary | ICD-10-CM | POA: Insufficient documentation

## 2015-09-18 DIAGNOSIS — I6503 Occlusion and stenosis of bilateral vertebral arteries: Secondary | ICD-10-CM | POA: Diagnosis not present

## 2015-09-18 DIAGNOSIS — Z882 Allergy status to sulfonamides status: Secondary | ICD-10-CM | POA: Insufficient documentation

## 2015-09-18 DIAGNOSIS — Z7982 Long term (current) use of aspirin: Secondary | ICD-10-CM | POA: Insufficient documentation

## 2015-09-18 DIAGNOSIS — Z7902 Long term (current) use of antithrombotics/antiplatelets: Secondary | ICD-10-CM | POA: Insufficient documentation

## 2015-09-18 DIAGNOSIS — I2510 Atherosclerotic heart disease of native coronary artery without angina pectoris: Secondary | ICD-10-CM | POA: Diagnosis not present

## 2015-09-18 DIAGNOSIS — Z720 Tobacco use: Secondary | ICD-10-CM

## 2015-09-18 DIAGNOSIS — I130 Hypertensive heart and chronic kidney disease with heart failure and stage 1 through stage 4 chronic kidney disease, or unspecified chronic kidney disease: Secondary | ICD-10-CM | POA: Diagnosis not present

## 2015-09-18 DIAGNOSIS — G459 Transient cerebral ischemic attack, unspecified: Principal | ICD-10-CM | POA: Insufficient documentation

## 2015-09-18 DIAGNOSIS — E039 Hypothyroidism, unspecified: Secondary | ICD-10-CM

## 2015-09-18 LAB — RAPID URINE DRUG SCREEN, HOSP PERFORMED
AMPHETAMINES: NOT DETECTED
BARBITURATES: NOT DETECTED
BENZODIAZEPINES: NOT DETECTED
Cocaine: NOT DETECTED
Opiates: NOT DETECTED
TETRAHYDROCANNABINOL: NOT DETECTED

## 2015-09-18 LAB — COMPREHENSIVE METABOLIC PANEL
ALBUMIN: 3.3 g/dL — AB (ref 3.5–5.0)
ALT: 28 U/L (ref 17–63)
AST: 48 U/L — AB (ref 15–41)
Alkaline Phosphatase: 70 U/L (ref 38–126)
Anion gap: 8 (ref 5–15)
CHLORIDE: 95 mmol/L — AB (ref 101–111)
CO2: 27 mmol/L (ref 22–32)
CREATININE: 0.93 mg/dL (ref 0.61–1.24)
Calcium: 8 mg/dL — ABNORMAL LOW (ref 8.9–10.3)
GFR calc Af Amer: >60 mL/min (ref >60)
GFR calc non Af Amer: >60 mL/min (ref >60)
GLUCOSE: 98 mg/dL (ref 65–99)
Potassium: 3.8 mmol/L (ref 3.5–5.1)
SODIUM: 130 mmol/L — AB (ref 135–145)
Total Bilirubin: 0.5 mg/dL (ref 0.3–1.2)
Total Protein: 6.2 g/dL — ABNORMAL LOW (ref 6.5–8.1)

## 2015-09-18 LAB — I-STAT TROPONIN, ED: Troponin i, poc: 0 ng/mL (ref 0.00–0.08)

## 2015-09-18 LAB — CBC
HCT: 37.8 % — ABNORMAL LOW (ref 39.0–52.0)
Hemoglobin: 12.6 g/dL — ABNORMAL LOW (ref 13.0–17.0)
MCH: 31.3 pg (ref 26.0–34.0)
MCHC: 33.3 g/dL (ref 30.0–36.0)
MCV: 94 fL (ref 78.0–100.0)
PLATELETS: 166 10*3/uL (ref 150–400)
RBC: 4.02 MIL/uL — AB (ref 4.22–5.81)
RDW: 13.7 % (ref 11.5–15.5)
WBC: 7.7 10*3/uL (ref 4.0–10.5)

## 2015-09-18 LAB — URINALYSIS, ROUTINE W REFLEX MICROSCOPIC
BILIRUBIN URINE: NEGATIVE
Glucose, UA: NEGATIVE mg/dL
HGB URINE DIPSTICK: NEGATIVE
Ketones, ur: NEGATIVE mg/dL
Leukocytes, UA: NEGATIVE
Nitrite: NEGATIVE
Protein, ur: NEGATIVE mg/dL
SPECIFIC GRAVITY, URINE: 1.011 (ref 1.005–1.030)
pH: 5 (ref 5.0–8.0)

## 2015-09-18 LAB — ETHANOL

## 2015-09-18 LAB — DIFFERENTIAL
BASOS ABS: 0 10*3/uL (ref 0.0–0.1)
BASOS PCT: 0 %
Eosinophils Absolute: 0.1 10*3/uL (ref 0.0–0.7)
Eosinophils Relative: 1 %
Lymphocytes Relative: 16 %
Lymphs Abs: 1.2 10*3/uL (ref 0.7–4.0)
Monocytes Absolute: 0.9 10*3/uL (ref 0.1–1.0)
Monocytes Relative: 12 %
NEUTROS ABS: 5.5 10*3/uL (ref 1.7–7.7)
Neutrophils Relative %: 71 %

## 2015-09-18 LAB — I-STAT CHEM 8, ED
BUN: 4 mg/dL — AB (ref 6–20)
CHLORIDE: 91 mmol/L — AB (ref 101–111)
Calcium, Ion: 0.94 mmol/L — ABNORMAL LOW (ref 1.12–1.23)
Creatinine, Ser: 0.8 mg/dL (ref 0.61–1.24)
GLUCOSE: 97 mg/dL (ref 65–99)
HCT: 40 % (ref 39.0–52.0)
Hemoglobin: 13.6 g/dL (ref 13.0–17.0)
POTASSIUM: 3.8 mmol/L (ref 3.5–5.1)
Sodium: 131 mmol/L — ABNORMAL LOW (ref 135–145)
TCO2: 27 mmol/L (ref 0–100)

## 2015-09-18 LAB — APTT: APTT: 28 s (ref 24–36)

## 2015-09-18 LAB — CBG MONITORING, ED: GLUCOSE-CAPILLARY: 93 mg/dL (ref 65–99)

## 2015-09-18 LAB — PROTIME-INR
INR: 1.01
PROTHROMBIN TIME: 13.3 s (ref 11.4–15.2)

## 2015-09-18 MED ORDER — LORAZEPAM 2 MG/ML IJ SOLN
1.0000 mg | Freq: Four times a day (QID) | INTRAMUSCULAR | Status: DC | PRN
  Administered 2015-09-19: 1 mg via INTRAVENOUS
  Filled 2015-09-18: qty 1

## 2015-09-18 MED ORDER — ONDANSETRON HCL 4 MG/2ML IJ SOLN: 4.0000 mg | Freq: Four times a day (QID) | INTRAMUSCULAR | Status: DC | PRN

## 2015-09-18 MED ORDER — NICOTINE POLACRILEX 2 MG MT GUM: 2.0000 mg | CHEWING_GUM | OROMUCOSAL | Status: DC | PRN

## 2015-09-18 MED ORDER — OXYCODONE HCL 5 MG PO TABS
5.0000 mg | ORAL_TABLET | ORAL | Status: DC | PRN
  Administered 2015-09-18 – 2015-09-20 (×4): 5 mg via ORAL
  Filled 2015-09-18 (×4): qty 1

## 2015-09-18 MED ORDER — SODIUM CHLORIDE 0.9 % IV SOLN: 250.0000 mL | INTRAVENOUS | Status: DC | PRN

## 2015-09-18 MED ORDER — BENAZEPRIL HCL 10 MG PO TABS
10.0000 mg | ORAL_TABLET | Freq: Every day | ORAL | Status: DC
  Administered 2015-09-20: 10 mg via ORAL
  Filled 2015-09-18 (×2): qty 1

## 2015-09-18 MED ORDER — SODIUM CHLORIDE 0.9% FLUSH: 3.0000 mL | Freq: Two times a day (BID) | INTRAVENOUS | Status: DC

## 2015-09-18 MED ORDER — SODIUM CHLORIDE 0.9 % IV SOLN
INTRAVENOUS | Status: DC
  Administered 2015-09-19: 75 mL/h via INTRAVENOUS

## 2015-09-18 MED ORDER — FOLIC ACID 1 MG PO TABS
1.0000 mg | ORAL_TABLET | Freq: Every day | ORAL | Status: DC
  Administered 2015-09-19 – 2015-09-20 (×2): 1 mg via ORAL
  Filled 2015-09-18 (×2): qty 1

## 2015-09-18 MED ORDER — SODIUM CHLORIDE 0.9% FLUSH: 3.0000 mL | INTRAVENOUS | Status: DC | PRN

## 2015-09-18 MED ORDER — ATORVASTATIN CALCIUM 40 MG PO TABS
40.0000 mg | ORAL_TABLET | Freq: Every day | ORAL | Status: DC
  Administered 2015-09-19 – 2015-09-20 (×2): 40 mg via ORAL
  Filled 2015-09-18 (×2): qty 1

## 2015-09-18 MED ORDER — IOPAMIDOL (ISOVUE-370) INJECTION 76%
INTRAVENOUS | Status: AC
Start: 2015-09-18 — End: 2015-09-18
  Administered 2015-09-18: 50 mL
  Filled 2015-09-18: qty 50

## 2015-09-18 MED ORDER — LEVOTHYROXINE SODIUM 100 MCG PO TABS
100.0000 ug | ORAL_TABLET | Freq: Every day | ORAL | Status: DC
  Administered 2015-09-19 – 2015-09-20 (×2): 100 ug via ORAL
  Filled 2015-09-18 (×2): qty 1

## 2015-09-18 MED ORDER — CARVEDILOL 12.5 MG PO TABS
6.2500 mg | ORAL_TABLET | Freq: Two times a day (BID) | ORAL | Status: DC
  Administered 2015-09-19 – 2015-09-20 (×3): 6.25 mg via ORAL
  Filled 2015-09-18 (×3): qty 1

## 2015-09-18 MED ORDER — NICOTINE 21 MG/24HR TD PT24
21.0000 mg | MEDICATED_PATCH | Freq: Every day | TRANSDERMAL | Status: DC
  Administered 2015-09-19 – 2015-09-20 (×2): 21 mg via TRANSDERMAL
  Filled 2015-09-18 (×2): qty 1

## 2015-09-18 MED ORDER — HYDRALAZINE HCL 20 MG/ML IJ SOLN: 5.0000 mg | INTRAMUSCULAR | Status: DC | PRN

## 2015-09-18 MED ORDER — ONDANSETRON HCL 4 MG PO TABS: 4.0000 mg | ORAL_TABLET | Freq: Four times a day (QID) | ORAL | Status: DC | PRN

## 2015-09-18 MED ORDER — FUROSEMIDE 20 MG PO TABS
20.0000 mg | ORAL_TABLET | Freq: Every day | ORAL | Status: DC
Start: 2015-09-19 — End: 2015-09-20
  Administered 2015-09-19 – 2015-09-20 (×2): 20 mg via ORAL
  Filled 2015-09-18 (×2): qty 1

## 2015-09-18 MED ORDER — ALLOPURINOL 100 MG PO TABS
100.0000 mg | ORAL_TABLET | Freq: Every day | ORAL | Status: DC
  Administered 2015-09-19 – 2015-09-20 (×2): 100 mg via ORAL
  Filled 2015-09-18 (×2): qty 1

## 2015-09-18 MED ORDER — ENOXAPARIN SODIUM 40 MG/0.4ML ~~LOC~~ SOLN
40.0000 mg | SUBCUTANEOUS | Status: DC
  Administered 2015-09-19: 40 mg via SUBCUTANEOUS
  Filled 2015-09-18: qty 0.4

## 2015-09-18 MED ORDER — VITAMIN B-1 100 MG PO TABS
100.0000 mg | ORAL_TABLET | Freq: Every day | ORAL | Status: DC
  Administered 2015-09-19 – 2015-09-20 (×2): 100 mg via ORAL
  Filled 2015-09-18 (×2): qty 1

## 2015-09-18 MED ORDER — STROKE: EARLY STAGES OF RECOVERY BOOK
Freq: Once | Status: DC
  Filled 2015-09-18: qty 1

## 2015-09-18 MED ORDER — ASPIRIN EC 325 MG PO TBEC
325.0000 mg | DELAYED_RELEASE_TABLET | Freq: Every day | ORAL | Status: DC
  Administered 2015-09-19 – 2015-09-20 (×2): 325 mg via ORAL
  Filled 2015-09-18 (×2): qty 1

## 2015-09-18 MED ORDER — THIAMINE HCL 100 MG/ML IJ SOLN: 100.0000 mg | Freq: Every day | INTRAMUSCULAR | Status: DC

## 2015-09-18 MED ORDER — ADULT MULTIVITAMIN W/MINERALS CH
1.0000 | ORAL_TABLET | Freq: Every day | ORAL | Status: DC
  Administered 2015-09-19 – 2015-09-20 (×2): 1 via ORAL
  Filled 2015-09-18 (×2): qty 1

## 2015-09-18 MED ORDER — ACETAMINOPHEN 325 MG PO TABS
650.0000 mg | ORAL_TABLET | Freq: Four times a day (QID) | ORAL | Status: DC | PRN
  Administered 2015-09-18 – 2015-09-19 (×2): 650 mg via ORAL
  Filled 2015-09-18 (×2): qty 2

## 2015-09-18 MED ORDER — ACETAMINOPHEN 650 MG RE SUPP: 650.0000 mg | Freq: Four times a day (QID) | RECTAL | Status: DC | PRN

## 2015-09-18 MED ORDER — LORAZEPAM 1 MG PO TABS: 1.0000 mg | ORAL_TABLET | Freq: Four times a day (QID) | ORAL | Status: DC | PRN

## 2015-09-18 MED ORDER — ONDANSETRON HCL 4 MG/2ML IJ SOLN
4.0000 mg | Freq: Once | INTRAMUSCULAR | Status: AC
  Administered 2015-09-18: 4 mg via INTRAVENOUS
  Filled 2015-09-18: qty 2

## 2015-09-18 NOTE — Code Documentation (Signed)
70yo male arriving to [facility] via private vehicle at 1300.  Patient from home where he developed sudden onset right sided numbness while laying in bed watching TV at 1230.  Code stroke activated on patient arrival.  Patient transported to CT.  Stroke team to the bedside.  NIHSS 2, see documentation for details and code stroke times.  Patient with mild right facial droop and subjective decreased sensation on the right side.  Of note, patient with h/o stroke with similar symptoms.  Dr. Roxy Manns at the bedside.  Patient is too mild to treat with tPA at this time, however, remains in the window to treat until 1700 should symptoms worsen.  Bedside handoff with ED RNs Melanie/Cassie.

## 2015-09-18 NOTE — H&P (Signed)
History and Physical    Don [name] VFI:[id number] DOB: 1945/12/22 DOA: 09/18/2015  PCP: [name], MD Patient coming from: home  Chief Complaint: facial droop  HPI: Don [name] is a 70 y.o. male with medical history significant of ETOH abuse, , tobacco abuse, CAD, CHF, LBBB status post ICD placement, hypothyroidism, stroke  Right facial droop. Right upper extremity numbness. Started while patient was watching TV at 12:30. Started acutely. Mild dizziness (not vertigo). Constant. Resolving. Denies any chest pain, dizziness, LOC, neck stiffness, headache, chest pain, shortness of breath, palpitations, fevers.  ED Course: Code stroke initiated. Evaluate by neuro. Patient is on TPA candidate. CT unrevealing. Symptoms resolving. Objective findings outlined below.  Review of Systems: As per HPI otherwise 10 point review of systems negative.   Ambulatory Status: No restrictions  Past Medical History:  Diagnosis Date  . Alcohol abuse    PREVIOUS H/O DRINKING A FIFTH OF VODKA  A DAY  . Anginal pain (HCC)   . Arthritis    hands & back  . CHF (congestive heart failure) (HCC)   . Chronic kidney disease    acute renal  04/2012  . Chronic systolic heart failure (HCC)    LEFT  . Coronary artery disease   . History of angina   . Hypertension   . ICD (implantable cardiac defibrillator) in place   . LBBB (left bundle branch block)   . Nonischemic cardiomyopathy (HCC)    EF 20% s/p INSERTION OF BI-V DEFIBRILLATOR.  . Stroke (HCC)    TIA's  . Thyroid disease    HYPOTHYROIDISM.....TREATED  . TIA (transient ischemic attack)     Past Surgical History:  Procedure Laterality Date  . CARDIAC CATHETERIZATION    . CYSTOSCOPY W/ STONE MANIPULATION    . FLEXIBLE SIGMOIDOSCOPY N/A 04/29/2012   Procedure: FLEXIBLE SIGMOIDOSCOPY;  Surgeon: Vertell Novak., MD;  Location[facility] ENDOSCOPY;  Service: Endoscopy;  Laterality: N/A;  . IMPLANTABLE CARDIOVERTER DEFIBRILLATOR  (ICD) GENERATOR CHANGE Bilateral 12/25/2011   Procedure: ICD GENERATOR CHANGE;  Surgeon: Marinus Maw, MD;  Location: [facility] CATH LAB;  Service: Cardiovascular;  Laterality: Bilateral;  . INSERT / REPLACE / REMOVE PACEMAKER     BI-V IMPLANTABLE CARDIOVERTER-DEFIBRILLATOR. ; [employer]; MODEL  # [id number], SERIAL # [id number] H.  DR. Humberto Leep. EDMUNDS.  . TONSILLECTOMY    . VASECTOMY      Social History   Social History  . Marital status: Widowed    Spouse name: N/A  . Number of children: N/A  . Years of education: N/A   Occupational History  . RETIRED     SOCIAL WORKER   Social History Main Topics  . Smoking status: Current Every Day Smoker    Packs/day: 1.50    Years: 43.00    Last attempt to quit: 02/27/2012  . Smokeless tobacco: Never Used  . Alcohol use No     Comment: H/O ALCOHOLISM PERVIOUSLY A FIFTH OF VODKA A DAY  . Drug use: No  . Sexual activity: Not on file   Other Topics Concern  . Not on file   Social History Narrative   BI-V IMPLANTABLE CARDIOVERTER-DEFIBRILLATOR; [employer], MODEL # [id number]; SERIAL # [id number] H;       RETIRED SOCIAL WORKER      MARRIED 4 TIMES      H/O ALCOHOLISM; A FIFTH OF VODKA DAILY IN THE PAST             Allergies  Allergen Reactions  . Sulfa Antibiotics  Nausea And Vomiting    Family History  Problem Relation Age of Onset  . Heart disease Mother   . Heart disease Father   . Hypertension      Prior to Admission medications   Medication Sig Start Date End Date Taking? Authorizing Provider  allopurinol (ZYLOPRIM) 100 MG tablet Take 100 mg by mouth daily.     Historical Provider, MD  aspirin 81 MG tablet Take 81 mg by mouth daily.    Historical Provider, MD  atorvastatin (LIPITOR) 40 MG tablet Take 40 mg by mouth daily.  09/03/11   Historical Provider, MD  benazepril (LOTENSIN) 10 MG tablet Take 10 mg by mouth daily.    Historical Provider, MD  calcium carbonate (TUMS - DOSED IN MG ELEMENTAL CALCIUM) 500 MG  chewable tablet Chew 1 tablet by mouth daily.    Historical Provider, MD  carvedilol (COREG) 6.25 MG tablet Take 6.25 mg by mouth 2 (two) times daily with a meal.    Historical Provider, MD  Cholecalciferol 4000 UNITS CAPS Take 1 capsule by mouth daily.    Historical Provider, MD  furosemide (LASIX) 20 MG tablet Take 20 mg by mouth daily.    Historical Provider, MD  levothyroxine (SYNTHROID, LEVOTHROID) 100 MCG tablet Take 100 mcg by mouth daily before breakfast.    Historical Provider, MD  nitroGLYCERIN (NITROSTAT) 0.4 MG SL tablet Place 0.4 mg under the tongue every 5 (five) minutes as needed for chest pain.     Historical Provider, MD  OVER THE COUNTER MEDICATION Take 1 tablet by mouth 2 (two) times daily. Multi-allergy tablet    Historical Provider, MD    Physical Exam: Vitals:   09/18/15 1413 09/18/15 1415 09/18/15 1500 09/18/15 1515  BP: 128/89 126/87 125/77 118/83  Pulse: 68 67 76 71  Resp: 15 19 21 15   SpO2: 99% 98% 98% 99%  Weight:         General:  Appears calm and comfortable Eyes:  PERRL, EOMI, normal lids, iris ENT:  grossly normal hearing, lips & tongue, mmm Neck:  no LAD, masses or thyromegaly Cardiovascular:  RRR, no m/r/g. No LE edema.  Respiratory:  CTA bilaterally, no w/r/r. Normal respiratory effort. Abdomen:  soft, ntnd, NABS Skin:  no rash or induration seen on limited exam Musculoskeletal:  grossly normal tone BUE/BLE, good ROM, no bony abnormality Psychiatric:  grossly normal mood and affect, speech fluent and appropriate, AOx3 Neurologic: Cranial nerves II through XII intact (patient endorses no change in sensation between right and left side in V3 through V5 distributions bilaterally.) No dysmetria. Sits unassisted without difficulty or truncal ataxia. Moves all extremities and coordinated fashion. Grip strength, arm flexion, and leg flexion 5 out of 5 bilateral  Labs on Admission: I have personally reviewed following labs and imaging  studies  CBC:  Recent Labs Lab 09/18/15 1324 09/18/15 1333  WBC 7.7  --   NEUTROABS 5.5  --   HGB 12.6* 13.6  HCT 37.8* 40.0  MCV 94.0  --   PLT 166  --    Basic Metabolic Panel:  Recent Labs Lab 09/18/15 1324 09/18/15 1333  NA 130* 131*  K 3.8 3.8  CL 95* 91*  CO2 27  --   GLUCOSE 98 97  BUN <5* 4*  CREATININE 0.93 0.80  CALCIUM 8.0*  --    GFR: Estimated Creatinine Clearance: 110.1 mL/min (by C-G formula based on SCr of 0.8 mg/dL). Liver Function Tests:  Recent Labs Lab 09/18/15 1324  AST 48*  ALT 28  ALKPHOS 70  BILITOT 0.5  PROT 6.2*  ALBUMIN 3.3*   No results for input(s): LIPASE, AMYLASE in the last 168 hours. No results for input(s): AMMONIA in the last 168 hours. Coagulation Profile:  Recent Labs Lab 09/18/15 1324  INR 1.01   Cardiac Enzymes: No results for input(s): CKTOTAL, CKMB, CKMBINDEX, TROPONINI in the last 168 hours. BNP (last 3 results) No results for input(s): PROBNP in the last 8760 hours. HbA1C: No results for input(s): HGBA1C in the last 72 hours. CBG:  Recent Labs Lab 09/18/15 1323  GLUCAP 93   Lipid Profile: No results for input(s): CHOL, HDL, LDLCALC, TRIG, CHOLHDL, LDLDIRECT in the last 72 hours. Thyroid Function Tests: No results for input(s): TSH, T4TOTAL, FREET4, T3FREE, THYROIDAB in the last 72 hours. Anemia Panel: No results for input(s): VITAMINB12, FOLATE, FERRITIN, TIBC, IRON, RETICCTPCT in the last 72 hours. Urine analysis:    Component Value Date/Time   COLORURINE YELLOW 09/18/2015 1343   APPEARANCEUR CLEAR 09/18/2015 1343   LABSPEC 1.011 09/18/2015 1343   PHURINE 5.0 09/18/2015 1343   GLUCOSEU NEGATIVE 09/18/2015 1343   HGBUR NEGATIVE 09/18/2015 1343   BILIRUBINUR NEGATIVE 09/18/2015 1343   KETONESUR NEGATIVE 09/18/2015 1343   PROTEINUR NEGATIVE 09/18/2015 1343   UROBILINOGEN 1.0 09/04/2014 1554   NITRITE NEGATIVE 09/18/2015 1343   LEUKOCYTESUR NEGATIVE 09/18/2015 1343    Creatinine  Clearance: Estimated Creatinine Clearance: 110.1 mL/min (by C-G formula based on SCr of 0.8 mg/dL).  Sepsis Labs: @LABRCNTIP (procalcitonin:4,lacticidven:4) )No results found for this or any previous visit (from the past 240 hour(s)).   Radiological Exams on Admission: Ct Head Code Stroke W/o Cm  Result Date: 09/18/2015 CLINICAL DATA:  Code stroke. 70 year old male with right-sided facial droop. Initial encounter. EXAM: CT HEAD WITHOUT CONTRAST TECHNIQUE: Contiguous axial images were obtained from the base of the skull through the vertex without intravenous contrast. COMPARISON:  09/04/2014 CT. FINDINGS: No intracranial hemorrhage. Chronic microvascular changes without CT evidence of large acute infarct. Tortuous vertebral arteries and basilar artery. Partial calcification cavernous segment internal carotid artery bilaterally. Global atrophy without hydrocephalus. No intracranial mass lesion noted on this unenhanced exam. Polypoid opacification maxillary sinuses more notable on the right. Mild mucosal thickening ethmoid sinus air cells. No acute orbital abnormality.  Minimal exophthalmos. ASPECTS [facility] Stroke Program Early CT Score, [email or web address]) - Ganglionic level infarction (caudate, lentiform nuclei, internal capsule, insula, M1-M3 cortex): 7 - Supraganglionic infarction (M4-M6 cortex): 3 Total score (0-10): 10 IMPRESSION: 1. No intracranial hemorrhage. Chronic microvascular changes without CT evidence of large acute infarct. Global atrophy. Polypoid opacification maxillary sinuses greater on the right. Tortuous vertebral arteries and basilar artery. Partially calcified carotid arteries. 2. ASPECTS score 10 Electronically Signed   By: Lacy Duverney M.D.   On: 09/18/2015 13:30     Assessment/Plan Active Problems:   Alcohol abuse   TIA (transient ischemic attack)   History of stroke   HLD (hyperlipidemia)   Hypothyroid   Essential hypertension   Tobacco use   Stroke like  symptoms: Patient with resolving right upper and right lower extremity numbness and right facial weakness and numbness. Patient has had a previous CVA which left him with very mild right facial weakness and numbness. CT without evidence of acute process. Patient unable to get MRI. Evaluated by neuro. Continue stroke workup. Unlikely related to migrain as no HA, though may be in part related to ETOH as pt is a very heavy drinkier - tele - CTA head and neck - PT/OT -  ASA, statin - Echo  ETOH abuse: heavy drinker. ETOH <5% on admission. - ETOH level - CIWA  Hypothyroid: - continue synthroid  Hyponatremia: 1:30 on admission. Also with hypochloremia. Suspect from alcohol abuse. -  IVF, NS  - BMP in a.m.   HTN: allow permissive hypertension as symptoms persist thought quickly resolving - hold benazepril, coreg until am  Chronic combined systolic and diastolic congestive heart failure and LBBB: EF 45% with grade 1 diastolic dysfunction. No evidence of acute decompensation. ICD in place.  - continue lasix, ACEi, and bblocker - Echo  Tobacco: 1.5 ppd minimum - nicotine.  - cessation counseling  HLD: - continue lipitor  Gout: - allopurinol   DVT prophylaxis: Lovenox  Code Status: full  Family Communication: none  Disposition Plan: pending improvement  Consults called: neuro  Admission status: obs - tele    Jorie Zee J MD [facility]  If 7PM-7AM, please contact night-coverage [email or web address] Password [email or web address]  09/18/2015, 3:23 PM

## 2015-09-18 NOTE — Consult Note (Signed)
Requesting Physician: Dr. Corlis Leak    Chief Complaint: right facial droop and right sided numbness  History obtained from:  Patient     HPI:                                                                                                                                         Don [name] is an 70 y.o. male with history of stroke, pacemaker placement for CHF, ETOH abuse and tobacco abuse. Patietn was laying on his bed when at 1240 he got up and told his daughter he felt numb on his right side and dizzy. He was driven to [facility] ED and Code stroke was called. CT head was negative for bleed and exam was notable for on ly right arm decreased sensation, nystagmus when looking to the left and right facial droop. Per patient his right facial droop may be residual from old stroke.   Date last known well: Today Time last known well: Time: 12:40 tPA Given: No: minimal symptoms   Past Medical History:  Diagnosis Date  . Alcohol abuse    PREVIOUS H/O DRINKING A FIFTH OF VODKA  A DAY  . Anginal pain (HCC)   . Arthritis    hands & back  . CHF (congestive heart failure) (HCC)   . Chronic kidney disease    acute renal  04/2012  . Chronic systolic heart failure (HCC)    LEFT  . Coronary artery disease   . History of angina   . Hypertension   . ICD (implantable cardiac defibrillator) in place   . LBBB (left bundle branch block)   . Nonischemic cardiomyopathy (HCC)    EF 20% s/p INSERTION OF BI-V DEFIBRILLATOR.  . Stroke (HCC)    TIA's  . Thyroid disease    HYPOTHYROIDISM.....TREATED  . TIA (transient ischemic attack)     Past Surgical History:  Procedure Laterality Date  . CARDIAC CATHETERIZATION    . CYSTOSCOPY W/ STONE MANIPULATION    . FLEXIBLE SIGMOIDOSCOPY N/A 04/29/2012   Procedure: FLEXIBLE SIGMOIDOSCOPY;  Surgeon: Vertell Novak., MD;  Location: [facility] ENDOSCOPY;  Service: Endoscopy;  Laterality: N/A;  . IMPLANTABLE CARDIOVERTER DEFIBRILLATOR (ICD) GENERATOR CHANGE Bilateral  12/25/2011   Procedure: ICD GENERATOR CHANGE;  Surgeon: Marinus Maw, MD;  Location: [facility] CATH LAB;  Service: Cardiovascular;  Laterality: Bilateral;  . INSERT / REPLACE / REMOVE PACEMAKER     BI-V IMPLANTABLE CARDIOVERTER-DEFIBRILLATOR. ; [employer]; MODEL  # [id number], SERIAL # [id number] H.  DR. Humberto Leep. EDMUNDS.  . TONSILLECTOMY    . VASECTOMY      Family History  Problem Relation Age of Onset  . Heart disease Mother   . Heart disease Father   . Cancer    . Hypertension     Social History:  reports that he has been smoking.  He has smoked for the past 43.00 years. He has  never used smokeless tobacco. He reports that he does not drink alcohol or use drugs.  Allergies:  Allergies  Allergen Reactions  . Sulfa Antibiotics Nausea And Vomiting    Medications:                                                                                                                           No current facility-administered medications for this encounter.    Current Outpatient Prescriptions  Medication Sig Dispense Refill  . allopurinol (ZYLOPRIM) 100 MG tablet Take 100 mg by mouth daily.     Marland Kitchen aspirin 81 MG tablet Take 81 mg by mouth daily.    Marland Kitchen atorvastatin (LIPITOR) 40 MG tablet Take 40 mg by mouth daily.     . benazepril (LOTENSIN) 10 MG tablet Take 10 mg by mouth daily.    . calcium carbonate (TUMS - DOSED IN MG ELEMENTAL CALCIUM) 500 MG chewable tablet Chew 1 tablet by mouth daily.    . carvedilol (COREG) 6.25 MG tablet Take 6.25 mg by mouth 2 (two) times daily with a meal.    . Cholecalciferol 4000 UNITS CAPS Take 1 capsule by mouth daily.    . furosemide (LASIX) 20 MG tablet Take 20 mg by mouth daily.    Marland Kitchen levothyroxine (SYNTHROID, LEVOTHROID) 100 MCG tablet Take 100 mcg by mouth daily before breakfast.    . nitroGLYCERIN (NITROSTAT) 0.4 MG SL tablet Place 0.4 mg under the tongue every 5 (five) minutes as needed for chest pain.     Marland Kitchen OVER THE COUNTER MEDICATION Take 1 tablet by  mouth 2 (two) times daily. Multi-allergy tablet       ROS:                                                                                                                                       History obtained from the patient  General ROS: negative for - chills, fatigue, fever, night sweats, weight gain or weight loss Psychological ROS: negative for - behavioral disorder, hallucinations, memory difficulties, mood swings or suicidal ideation Ophthalmic ROS: negative for - blurry vision, double vision, eye pain or loss of vision ENT ROS: negative for - epistaxis, nasal discharge, oral lesions, sore throat, tinnitus or vertigo Allergy and Immunology ROS: negative for - hives or itchy/watery eyes Hematological and Lymphatic ROS: negative for - bleeding problems, bruising or swollen lymph  nodes Endocrine ROS: negative for - galactorrhea, hair pattern changes, polydipsia/polyuria or temperature intolerance Respiratory ROS: negative for - cough, hemoptysis, shortness of breath or wheezing Cardiovascular ROS: negative for - chest pain, dyspnea on exertion, edema or irregular heartbeat Gastrointestinal ROS: negative for - abdominal pain, diarrhea, hematemesis, nausea/vomiting or stool incontinence Genito-Urinary ROS: negative for - dysuria, hematuria, incontinence or urinary frequency/urgency Musculoskeletal ROS: negative for - joint swelling or muscular weakness Neurological ROS: as noted in HPI Dermatological ROS: negative for rash and skin lesion changes  Neurologic Examination:                                                                                                      Blood pressure 124/81, pulse 79, resp. rate 25, weight 103.4 kg (227 lb 15.3 oz), SpO2 97 %.  HEENT-  Normocephalic, no lesions, without obvious abnormality.  Normal external eye and conjunctiva.  Normal TM's bilaterally.  Normal auditory canals and external ears. Normal external nose, mucus membranes and septum.  Normal  pharynx. Cardiovascular- S1, S2 normal, pulses palpable throughout   Lungs- chest clear, no wheezing, rales, normal symmetric air entry Abdomen- normal findings: bowel sounds normal Extremities- no edema Lymph-no adenopathy palpable Musculoskeletal-no joint tenderness, deformity or swelling Skin-warm and dry, no hyperpigmentation, vitiligo, or suspicious lesions  Neurological Examination Mental Status: Alert, oriented, thought content appropriate.  Speech fluent without evidence of aphasia.  Able to follow 3 step commands without difficulty. Cranial Nerves: II:  Visual fields grossly normal, pupils equal, round, reactive to light and accommodation III,IV, VI: ptosis not present, extra-ocular motions intact bilaterally with horizontal nystagmus exaggerated when looking to the left V,VII: smile asymmetric on the right, facial light touch sensation normal bilaterally VIII: hearing normal bilaterally IX,X: uvula rises symmetrically XI: bilateral shoulder shrug XII: midline tongue extension Motor: Right : Upper extremity   5/5    Left:     Upper extremity   5/5  Lower extremity   5/5     Lower extremity   5/5 Tone and bulk:normal tone throughout; no atrophy noted Sensory: Pinprick and light touch intact throughout but decreased on the right arm Deep Tendon Reflexes: 2+ and symmetric throughout Plantars: Right: downgoing   Left: downgoing Cerebellar: normal finger-to-nose and nrmal gait and statioormal heel-to-shin test Gait: not tested       Lab Results: Basic Metabolic Panel:  Recent Labs Lab 09/18/15 1333  NA 131*  K 3.8  CL 91*  GLUCOSE 97  BUN 4*  CREATININE 0.80    Liver Function Tests: No results for input(s): AST, ALT, ALKPHOS, BILITOT, PROT, ALBUMIN in the last 168 hours. No results for input(s): LIPASE, AMYLASE in the last 168 hours. No results for input(s): AMMONIA in the last 168 hours.  CBC:  Recent Labs Lab 09/18/15 1324 09/18/15 1333  WBC 7.7  --    NEUTROABS 5.5  --   HGB 12.6* 13.6  HCT 37.8* 40.0  MCV 94.0  --   PLT 166  --     Cardiac Enzymes: No results for input(s): CKTOTAL, CKMB, CKMBINDEX, TROPONINI in  the last 168 hours.  Lipid Panel: No results for input(s): CHOL, TRIG, HDL, CHOLHDL, VLDL, LDLCALC in the last 168 hours.  CBG:  Recent Labs Lab 09/18/15 1323  GLUCAP 93    Microbiology: Results for orders placed or performed during the hospital encounter of 04/26/12  Urine culture     Status: None   Collection Time: 04/26/12  8:27 PM  Result Value Ref Range Status   Specimen Description URINE, RANDOM  Final   Special Requests NONE  Final   Culture  Setup Time 04/27/2012 03:07  Final   Colony Count NO GROWTH  Final   Culture NO GROWTH  Final   Report Status 04/27/2012 FINAL  Final  MRSA PCR Screening     Status: None   Collection Time: 04/27/12 12:41 AM  Result Value Ref Range Status   MRSA by PCR NEGATIVE NEGATIVE Final    Comment:        The GeneXpert MRSA Assay (FDA approved for NASAL specimens only), is one component of a comprehensive MRSA colonization surveillance program. It is not intended to diagnose MRSA infection nor to guide or monitor treatment for MRSA infections.  Clostridium Difficile by PCR     Status: None   Collection Time: 04/27/12  7:43 AM  Result Value Ref Range Status   Toxigenic C Difficile by pcr NEGATIVE NEGATIVE Final    Coagulation Studies:  Recent Labs  09/18/15 1324  LABPROT 13.3  INR 1.01    Imaging: Ct Head Code Stroke W/o Cm  Result Date: 09/18/2015 CLINICAL DATA:  Code stroke. 70 year old male with right-sided facial droop. Initial encounter. EXAM: CT HEAD WITHOUT CONTRAST TECHNIQUE: Contiguous axial images were obtained from the base of the skull through the vertex without intravenous contrast. COMPARISON:  09/04/2014 CT. FINDINGS: No intracranial hemorrhage. Chronic microvascular changes without CT evidence of large acute infarct. Tortuous vertebral  arteries and basilar artery. Partial calcification cavernous segment internal carotid artery bilaterally. Global atrophy without hydrocephalus. No intracranial mass lesion noted on this unenhanced exam. Polypoid opacification maxillary sinuses more notable on the right. Mild mucosal thickening ethmoid sinus air cells. No acute orbital abnormality.  Minimal exophthalmos. ASPECTS [facility] Stroke Program Early CT Score, [email or web address]) - Ganglionic level infarction (caudate, lentiform nuclei, internal capsule, insula, M1-M3 cortex): 7 - Supraganglionic infarction (M4-M6 cortex): 3 Total score (0-10): 10 IMPRESSION: 1. No intracranial hemorrhage. Chronic microvascular changes without CT evidence of large acute infarct. Global atrophy. Polypoid opacification maxillary sinuses greater on the right. Tortuous vertebral arteries and basilar artery. Partially calcified carotid arteries. 2. ASPECTS score 10 Electronically Signed   By: Lacy Duverney M.D.   On: 09/18/2015 13:30       Assessment and plan discussed with with attending physician and they are in agreement.    Felicie Morn PA-C [facility] [phone]  09/18/2015, 1:58 PM   Assessment: 70 y.o. male with sudden onset of right facial droop and right arm numbness with intermittent sensation of room spinning. At this time NIHSS is 2 and he is not a tPA candidate. He has a Visual merchandiser and cannot get MRI. At this time unclear if this is peeling of the onion as his previous CVA symptoms were similar versus new acute CVA.   Stroke Risk Factors - hypertension and smoking   Recommend: 1. HgbA1c, fasting lipid panel 2. PT consult, OT consult, Speech consult 3. Echocardiogram 4. Carotid dopplers 5. Prophylactic therapy-Antiplatelet med: Aspirin - dose 81 mg daily 6 Risk factor modification 7 Telemetry monitoring 8.  Frequent neuro checks 9 NPO until passes stroke swallow screen 10  please page stroke NP  Or  PA  Or MD from 8am  -4 pm  as this patient from this time will be  followed by the stroke.   You can look them up on [email or web address]  [facility]    Neurology Attending Addendum  This patient was seen, examined, and d/w PA. I have reviewed the note and agree with the findings, assessment and plan as documented with the following additions.   I was present in the emergency department for the entire evaluation of this patient during his code stroke. History is as noted by PA. The only addition is that the patient reports that his symptoms today are essentially identical to those that he experienced with a prior stroke. He has noticed some improvement in his symptoms since arrival in the emergency department. Also, with regards to his right facial droop, it is somewhat difficult to tell what is new and what is old as the patient says that people frequently comment on his facial weakness.  NIH stroke scale score 2  With regards to his social history, this is as noted by the PA with one correction. The patient did report alcohol use that is actually quite heavy. He reports drinking at least a fifth of hard liquor every day.  Physical exam: His elemental neurological examination is notable for several beats of nystagmus with left horizontal gaze. This disappears when he looks to the right. No vertical nystagmus is appreciated. He has some flattening of the right nasolabial fold with mild asymmetric smile on the right. Eye closure is symmetric. He is somewhat equivocal on the sensory examination, uncertain if he can tell any difference between pinprick on his right side compared to his left side.  I have personally and independently reviewed the CT scan of the head from 09/18/15. No acute abnormality is noted. There is patchy hypodensity in the bihemispheric white matter consistent with chronic small vessel ischemic change. The basilar artery appears to be ectatic. Moderate diffuse generalized atrophy is  present.  Impression: 1. Possible stroke: Given the patient's report that his symptoms are identical to those he experienced with a previous stroke, it is likely that these represent recrudescence. However, a small acute infarction in the posterior circulation cannot be completely excluded, particularly given his lateralizing nystagmus and reported vertigo. With his low NIH stroke scale score and the uncertainty for acute ischemia, tPA was not administered. Recommendations are as noted in the PA note above. He will be followed by the stroke service beginning 09/19/15.

## 2015-09-18 NOTE — ED Triage Notes (Signed)
Pt arrives to ED c/o right sided weakness, facial droop and slurred speech x30 minutes.

## 2015-09-18 NOTE — Progress Notes (Signed)
Patient arrived to 5M20 from ED at 52.  Patient alert and oriented X4 with no c/o pain. Vital signs taken and charted. O2 and IVF. Telemetry applied and CCMD notified. Oriented to room with bed alarm on . Diet ordered. Family at bedside.

## 2015-09-18 NOTE — ED Provider Notes (Signed)
[facility]-EMERGENCY DEPT Provider Note   CSN: [id number] Arrival date & time: 09/18/15  1300  First Provider Contact:  First MD Initiated Contact with Patient 09/18/15 1310        History   Chief Complaint Chief Complaint  Patient presents with  . Code Stroke    HPI Don [name] is a 70 y.o. male.  HPI   Patient is a 70 year old male with history of alcohol abuse, CHF, chronic kidney disease, hypertension ICD, nonischemic cardiomyopathy, TIAs. He is presenting today with symptoms of right facial droop and sensory changes in the right upper extremity. Patient was in that his house watching TV at 12:30 when the symptoms began. Patient was awake when the symptoms began. Came here as a code stroke.  No chest pain or shortness of breath.  Past Medical History:  Diagnosis Date  . Alcohol abuse    PREVIOUS H/O DRINKING A FIFTH OF VODKA  A DAY  . Anginal pain (HCC)   . Arthritis    hands & back  . CHF (congestive heart failure) (HCC)   . Chronic kidney disease    acute renal  04/2012  . Chronic systolic heart failure (HCC)    LEFT  . Coronary artery disease   . History of angina   . Hypertension   . ICD (implantable cardiac defibrillator) in place   . LBBB (left bundle branch block)   . Nonischemic cardiomyopathy (HCC)    EF 20% s/p INSERTION OF BI-V DEFIBRILLATOR.  . Stroke (HCC)    TIA's  . Thyroid disease    HYPOTHYROIDISM.....TREATED  . TIA (transient ischemic attack)     Patient Active Problem List   Diagnosis Date Noted  . 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 Hypocalcemia 02/13/2014  . Lymphadenopathy 09/28/2013  . TIA (transient ischemic attack) 08/16/2012  . Hemorrhoids with complication 04/29/2012  . Acute renal failure (HCC) 04/26/2012  . Hypovolemic shock (HCC) 04/26/2012  . Rectal bleeding 04/26/2012  . Diarrhea 04/26/2012  . Hypokalemia 04/26/2012  . Right rib fracture 04/26/2012  . Fall 04/26/2012  . Dehydration 04/26/2012  . Alcohol abuse   .  Stroke (HCC)   . 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   . Biventricular implantable cardioverter-defibrillator in situ 07/10/2010  . Chronic systolic heart failure (HCC) 07/10/2010  . Ventricular tachycardia (HCC) 07/10/2010    Past Surgical History:  Procedure Laterality Date  . CARDIAC CATHETERIZATION    . CYSTOSCOPY W/ STONE MANIPULATION    . FLEXIBLE SIGMOIDOSCOPY N/A 04/29/2012   Procedure: FLEXIBLE SIGMOIDOSCOPY;  Surgeon: Vertell Novak., MD;  Location: [facility] ENDOSCOPY;  Service: Endoscopy;  Laterality: N/A;  . IMPLANTABLE CARDIOVERTER DEFIBRILLATOR (ICD) GENERATOR CHANGE Bilateral 12/25/2011   Procedure: ICD GENERATOR CHANGE;  Surgeon: Marinus Maw, MD;  Location: [facility] CATH LAB;  Service: Cardiovascular;  Laterality: Bilateral;  . INSERT / REPLACE / REMOVE PACEMAKER     BI-V IMPLANTABLE CARDIOVERTER-DEFIBRILLATOR. ; [employer] CONCERTO; MODEL  # [id number], SERIAL # [id number] H.  DR. Humberto Leep. EDMUNDS.  . TONSILLECTOMY    . VASECTOMY         Home Medications    Prior to Admission medications   Medication Sig Start Date End Date Taking? Authorizing Provider  allopurinol (ZYLOPRIM) 100 MG tablet Take 100 mg by mouth daily.     Historical Provider, MD  aspirin 81 MG tablet Take 81 mg by mouth daily.    Historical Provider, MD  atorvastatin (LIPITOR) 40 MG tablet Take 40 mg by mouth  daily.  09/03/11   Historical Provider, MD  benazepril (LOTENSIN) 10 MG tablet Take 10 mg by mouth daily.    Historical Provider, MD  calcium carbonate (TUMS - DOSED IN MG ELEMENTAL CALCIUM) 500 MG chewable tablet Chew 1 tablet by mouth daily.    Historical Provider, MD  carvedilol (COREG) 6.25 MG tablet Take 6.25 mg by mouth 2 (two) times daily with a meal.    Historical Provider, MD  Cholecalciferol 4000 UNITS CAPS Take 1 capsule by mouth daily.    Historical Provider, MD  furosemide (LASIX) 20 MG tablet Take 20 mg by mouth daily.    Historical Provider, MD  levothyroxine (SYNTHROID,  LEVOTHROID) 100 MCG tablet Take 100 mcg by mouth daily before breakfast.    Historical Provider, MD  nitroGLYCERIN (NITROSTAT) 0.4 MG SL tablet Place 0.4 mg under the tongue every 5 (five) minutes as needed for chest pain.     Historical Provider, MD  OVER THE COUNTER MEDICATION Take 1 tablet by mouth 2 (two) times daily. Multi-allergy tablet    Historical Provider, MD    Family History Family History  Problem Relation Age of Onset  . Heart disease Mother   . Heart disease Father   . Hypertension      Social History Social History  Substance Use Topics  . Smoking status: Current Every Day Smoker    Packs/day: 1.50    Years: 43.00    Last attempt to quit: 02/27/2012  . Smokeless tobacco: Never Used  . Alcohol use No     Comment: H/O ALCOHOLISM PERVIOUSLY A FIFTH OF VODKA A DAY     Allergies   Sulfa antibiotics   Review of Systems Review of Systems  Constitutional: Negative for chills and fever.  Eyes: Negative for pain and visual disturbance.  Respiratory: Negative for cough and shortness of breath.   Cardiovascular: Negative for chest pain and palpitations.  Gastrointestinal: Negative for abdominal pain and vomiting.  Genitourinary: Negative for dysuria and hematuria.  Musculoskeletal: Negative for back pain.  Skin: Negative for color change and rash.  Neurological: Positive for facial asymmetry and numbness. Negative for seizures, syncope and weakness.  All other systems reviewed and are negative.    Physical Exam Updated Vital Signs BP 126/87   Pulse 67   Resp 19   Wt 227 lb 15.3 oz (103.4 kg)   SpO2 98%   BMI 30.08 kg/m   Physical Exam  Constitutional: He appears well-developed and well-nourished.  HENT:  Head: Normocephalic and atraumatic.  Edentulous  Eyes: Conjunctivae are normal.  Neck: Neck supple.  Cardiovascular: Normal rate.   No murmur heard. Pulmonary/Chest: Effort normal and breath sounds normal. No respiratory distress.  Abdominal: Soft.  There is no tenderness.  Musculoskeletal: He exhibits no edema.  Neurological: He is alert.  Mild facial droop on the right-hand side. Patient can feel sharp objects less on the right-hand side.  Equal strength bilaterally upper and lower extremities negative pronator drift. Marland Kitchen Speech comprehensible, no slurring.  Alert and oriented 3.   Skin: Skin is warm and dry.  Psychiatric: He has a normal mood and affect.  Nursing note and vitals reviewed.    ED Treatments / Results  Labs (all labs ordered are listed, but only abnormal results are displayed) Labs Reviewed  CBC - Abnormal; Notable for the following:       Result Value   RBC 4.02 (*)    Hemoglobin 12.6 (*)    HCT 37.8 (*)  All other components within normal limits  COMPREHENSIVE METABOLIC PANEL - Abnormal; Notable for the following:    Sodium 130 (*)    Chloride 95 (*)    BUN <5 (*)    Calcium 8.0 (*)    Total Protein 6.2 (*)    Albumin 3.3 (*)    AST 48 (*)    All other components within normal limits  I-STAT CHEM 8, ED - Abnormal; Notable for the following:    Sodium 131 (*)    Chloride 91 (*)    BUN 4 (*)    Calcium, Ion 0.94 (*)    All other components within normal limits  ETHANOL  PROTIME-INR  APTT  DIFFERENTIAL  URINE RAPID DRUG SCREEN, HOSP PERFORMED  URINALYSIS, ROUTINE W REFLEX MICROSCOPIC (NOT AT [facility])  I-STAT TROPOININ, ED  CBG MONITORING, ED    EKG  EKG Interpretation  Date/Time:  Tuesday September 18 2015 13:21:38 EDT Ventricular Rate:  74 PR Interval:    QRS Duration: 117 QT Interval:  385 QTC Calculation: 428 R Axis:   -69 Text Interpretation:  Sinus rhythm LAD, consider left anterior fascicular block Abnormal lateral Q waves Borderline T abnormalities, inferior leads Baseline wander in lead(s) V3 no acute ischemia  No significant change since last tracing Confirmed by Kandis Mannan ([id number]) on 09/18/2015 3:01:03 PM       Radiology Ct Head Code Stroke W/o Cm  Result Date:  09/18/2015 CLINICAL DATA:  Code stroke. 70 year old male with right-sided facial droop. Initial encounter. EXAM: CT HEAD WITHOUT CONTRAST TECHNIQUE: Contiguous axial images were obtained from the base of the skull through the vertex without intravenous contrast. COMPARISON:  09/04/2014 CT. FINDINGS: No intracranial hemorrhage. Chronic microvascular changes without CT evidence of large acute infarct. Tortuous vertebral arteries and basilar artery. Partial calcification cavernous segment internal carotid artery bilaterally. Global atrophy without hydrocephalus. No intracranial mass lesion noted on this unenhanced exam. Polypoid opacification maxillary sinuses more notable on the right. Mild mucosal thickening ethmoid sinus air cells. No acute orbital abnormality.  Minimal exophthalmos. ASPECTS [facility] Stroke Program Early CT Score, [email or web address]) - Ganglionic level infarction (caudate, lentiform nuclei, internal capsule, insula, M1-M3 cortex): 7 - Supraganglionic infarction (M4-M6 cortex): 3 Total score (0-10): 10 IMPRESSION: 1. No intracranial hemorrhage. Chronic microvascular changes without CT evidence of large acute infarct. Global atrophy. Polypoid opacification maxillary sinuses greater on the right. Tortuous vertebral arteries and basilar artery. Partially calcified carotid arteries. 2. ASPECTS score 10 Electronically Signed   By: Lacy Duverney M.D.   On: 09/18/2015 13:30    Procedures Procedures (including critical care time)  Medications Ordered in ED Medications  ondansetron (ZOFRAN) injection 4 mg (4 mg Intravenous Given 09/18/15 1425)     Initial Impression / Assessment and Plan / ED Course  I have reviewed the triage vital signs and the nursing notes.  Pertinent labs & imaging results that were available during my care of the patient were reviewed by me and considered in my medical decision making (see chart for details).  Clinical Course  Comment By Time  Cleared airway  for scanner Ripley Lovecchio Lyn Makenzy Krist, MD 08/01 1310   Patient is a 70 year old male with history of alcohol abuse, CHF, chronic kidney disease, hypertension ICD, nonischemic cardiomyopathy, TIAs resenting with strokelike symptoms. Stroke team to bedside. CT done. Pt complains of dizziness.  Neuro symtpoms are subtle.   Decision made to hold tpa as symtpoms are mild and improving.   Admit to medicine for TIA.    Final  Clinical Impressions(s) / ED Diagnoses   Final diagnoses:  Transient cerebral ischemia, unspecified transient cerebral ischemia type    New Prescriptions New Prescriptions   No medications on file     Lenae Wherley Randall An, MD 09/18/15 1501

## 2015-09-18 NOTE — ED Notes (Signed)
Pt returned from Ct. Pt on monitors.

## 2015-09-18 NOTE — ED Triage Notes (Signed)
The pt c/o R sided facial and arm numbness onset approximately 1230 today while lying in the bed watching TV. His family noticed his speech seemed slurred. He is alert, breathing easily. Code stroke called.

## 2015-09-19 ENCOUNTER — Observation Stay (HOSPITAL_BASED_OUTPATIENT_CLINIC_OR_DEPARTMENT_OTHER)
Admit: 2015-09-19 | Discharge: 2015-09-19 | Disposition: A | Discharge status: Home / Self Care | Payer: PPO | Attending: Nurse Practitioner | Admitting: Nurse Practitioner

## 2015-09-19 ENCOUNTER — Encounter (HOSPITAL_COMMUNITY): Payer: Self-pay | Admitting: *Deleted

## 2015-09-19 ENCOUNTER — Observation Stay (HOSPITAL_BASED_OUTPATIENT_CLINIC_OR_DEPARTMENT_OTHER): Payer: PPO

## 2015-09-19 DIAGNOSIS — R42 Dizziness and giddiness: Secondary | ICD-10-CM | POA: Diagnosis not present

## 2015-09-19 DIAGNOSIS — I6789 Other cerebrovascular disease: Secondary | ICD-10-CM | POA: Diagnosis not present

## 2015-09-19 DIAGNOSIS — I10 Essential (primary) hypertension: Secondary | ICD-10-CM | POA: Diagnosis not present

## 2015-09-19 DIAGNOSIS — G458 Other transient cerebral ischemic attacks and related syndromes: Secondary | ICD-10-CM

## 2015-09-19 DIAGNOSIS — G459 Transient cerebral ischemic attack, unspecified: Secondary | ICD-10-CM | POA: Diagnosis not present

## 2015-09-19 DIAGNOSIS — G451 Carotid artery syndrome (hemispheric): Secondary | ICD-10-CM

## 2015-09-19 DIAGNOSIS — F1010 Alcohol abuse, uncomplicated: Secondary | ICD-10-CM | POA: Diagnosis not present

## 2015-09-19 LAB — LIPID PANEL
Cholesterol: 119 mg/dL (ref 0–200)
HDL: 71 mg/dL (ref >40)
LDL Cholesterol: 38 mg/dL (ref 0–99)
Total CHOL/HDL Ratio: 1.7 RATIO
Triglycerides: 49 mg/dL (ref <150)
VLDL: 10 mg/dL (ref 0–40)

## 2015-09-19 LAB — CBC
HEMATOCRIT: 37.1 % — AB (ref 39.0–52.0)
HEMOGLOBIN: 12.3 g/dL — AB (ref 13.0–17.0)
MCH: 31.4 pg (ref 26.0–34.0)
MCHC: 33.2 g/dL (ref 30.0–36.0)
MCV: 94.6 fL (ref 78.0–100.0)
Platelets: 153 10*3/uL (ref 150–400)
RBC: 3.92 MIL/uL — ABNORMAL LOW (ref 4.22–5.81)
RDW: 14.1 % (ref 11.5–15.5)
WBC: 6.6 10*3/uL (ref 4.0–10.5)

## 2015-09-19 LAB — HEMOGLOBIN A1C
HEMOGLOBIN A1C: 4.9 % (ref 4.8–5.6)
MEAN PLASMA GLUCOSE: 94 mg/dL

## 2015-09-19 LAB — BASIC METABOLIC PANEL
ANION GAP: 9 (ref 5–15)
BUN: <5 mg/dL — ABNORMAL LOW (ref 6–20)
CO2: 29 mmol/L (ref 22–32)
Calcium: 7.5 mg/dL — ABNORMAL LOW (ref 8.9–10.3)
Chloride: 96 mmol/L — ABNORMAL LOW (ref 101–111)
Creatinine, Ser: 0.75 mg/dL (ref 0.61–1.24)
GFR calc Af Amer: >60 mL/min (ref >60)
GFR calc non Af Amer: >60 mL/min (ref >60)
GLUCOSE: 79 mg/dL (ref 65–99)
POTASSIUM: 2.5 mmol/L — AB (ref 3.5–5.1)
Sodium: 134 mmol/L — ABNORMAL LOW (ref 135–145)

## 2015-09-19 LAB — ECHOCARDIOGRAM COMPLETE: Weight: 3647.29 oz

## 2015-09-19 LAB — POTASSIUM: Potassium: 3.7 mmol/L (ref 3.5–5.1)

## 2015-09-19 MED ORDER — CLOPIDOGREL BISULFATE 75 MG PO TABS
75.0000 mg | ORAL_TABLET | Freq: Every day | ORAL | Status: DC
  Administered 2015-09-19 – 2015-09-20 (×2): 75 mg via ORAL
  Filled 2015-09-19 (×2): qty 1

## 2015-09-19 MED ORDER — HYPROMELLOSE (GONIOSCOPIC) 2.5 % OP SOLN
1.0000 [drp] | Freq: Four times a day (QID) | OPHTHALMIC | Status: DC | PRN
  Administered 2015-09-20: 1 [drp] via OPHTHALMIC
  Filled 2015-09-19: qty 15

## 2015-09-19 MED ORDER — POTASSIUM CHLORIDE CRYS ER 20 MEQ PO TBCR
40.0000 meq | EXTENDED_RELEASE_TABLET | Freq: Once | ORAL | Status: AC
  Administered 2015-09-19: 40 meq via ORAL
  Filled 2015-09-19: qty 2

## 2015-09-19 MED ORDER — ZOLPIDEM TARTRATE 5 MG PO TABS: 5.0000 mg | ORAL_TABLET | Freq: Every evening | ORAL | Status: DC | PRN

## 2015-09-19 MED ORDER — POTASSIUM CHLORIDE 10 MEQ/100ML IV SOLN
10.0000 meq | INTRAVENOUS | Status: AC
  Administered 2015-09-19 (×4): 10 meq via INTRAVENOUS
  Filled 2015-09-19 (×3): qty 100

## 2015-09-19 NOTE — Progress Notes (Signed)
STROKE TEAM PROGRESS NOTE   HISTORY OF PRESENT ILLNESS (per record) Don [name] is an 70 y.o. male with history of stroke, pacemaker placement for CHF, ETOH abuse and tobacco abuse. Patient was laying on his bed when at 1240 09/18/2015 he got up and told his daughter he felt numb on his right side and dizzy. He was driven to [facility] ED and Code stroke was called. CT head was negative for bleed and exam was notable for on ly right arm decreased sensation, nystagmus when looking to the left and right facial droop. Per patient his right facial droop may be residual from old stroke.  Patient was not administered IV t-PA secondary to minimal symptoms. He was admitted for further evaluation and treatment.   SUBJECTIVE (INTERVAL HISTORY) No friends/family are is at the bedside.  His echo tech is performing 2D currently. Overall he feels his condition is stable. Pt has hx R arm and face numbness x 2 in 2014 and 2015. Now wider distribution to the right leg. EF 40-45% today .    OBJECTIVE Temp:  [97.8 F (36.6 C)-98.9 F (37.2 C)] 98.6 F (37 C) (08/02 0910) Pulse Rate:  [62-82] 82 (08/02 0910) Cardiac Rhythm: A-V Sequential paced;Normal sinus rhythm;Heart block (08/02 0822) Resp:  [15-25] 18 (08/02 0910) BP: (88-151)/(53-108) 88/62 (08/02 0910) SpO2:  [97 %-100 %] 98 % (08/02 0910) Weight:  [103.4 kg (227 lb 15.3 oz)] 103.4 kg (227 lb 15.3 oz) (08/01 1322)  CBC:   Recent Labs Lab 09/18/15 1324 09/18/15 1333 09/19/15 0520  WBC 7.7  --  6.6  NEUTROABS 5.5  --   --   HGB 12.6* 13.6 12.3*  HCT 37.8* 40.0 37.1*  MCV 94.0  --  94.6  PLT 166  --  153    Basic Metabolic Panel:   Recent Labs Lab 09/18/15 1324 09/18/15 1333 09/19/15 0520  NA 130* 131* 134*  K 3.8 3.8 2.5*  CL 95* 91* 96*  CO2 27  --  29  GLUCOSE 98 97 79  BUN <5* 4* <5*  CREATININE 0.93 0.80 0.75  CALCIUM 8.0*  --  7.5*    Lipid Panel:     Component Value Date/Time   CHOL 119 09/19/2015 0500   TRIG 49  09/19/2015 0500   HDL 71 09/19/2015 0500   CHOLHDL 1.7 09/19/2015 0500   VLDL 10 09/19/2015 0500   LDLCALC 38 09/19/2015 0500   HgbA1c:  Lab Results  Component Value Date   HGBA1C 5.0 08/16/2012   Urine Drug Screen:     Component Value Date/Time   LABOPIA NONE DETECTED 09/18/2015 1343   COCAINSCRNUR NONE DETECTED 09/18/2015 1343   LABBENZ NONE DETECTED 09/18/2015 1343   AMPHETMU NONE DETECTED 09/18/2015 1343   THCU NONE DETECTED 09/18/2015 1343   LABBARB NONE DETECTED 09/18/2015 1343      IMAGING I have personally reviewed the radiological images below and agree with the radiology interpretations.  CT HEAD Code Stroke  09/18/2015 1. No intracranial hemorrhage. Chronic microvascular changes without CT evidence of large acute infarct. Global atrophy. Polypoid opacification maxillary sinuses greater on the right. Tortuous vertebral arteries and basilar artery. Partially calcified carotid arteries. 2. ASPECTS score 10   CT HEAD  09/18/2015 No intracranial hemorrhage or CT evidence of large acute infarct. Chronic microvascular changes. Global atrophy without hydrocephalus. No intracranial enhancing lesion. Polypoid opacification maxillary sinuses greater on the right   CTA NECK  09/18/2015 Plaque carotid bifurcation/ proximal internal carotid artery bilaterally with less  than 50% diameter stenosis. Ectatic distal cervical segment of the internal carotid artery bilaterally. Right vertebral artery is dominant. Plaque with mild narrowing proximal right vertebral artery. Plaque with mild narrowing right subclavian artery. No significant stenosis left vertebral artery.   CTA HEAD  09/18/2015 Plaque cavernous segment internal carotid artery with mild to slightly moderate narrowing bilaterally. Fetal contribution to the right posterior cerebral artery. No significant stenosis of either carotid terminus, M1 segment or A1 segment of the middle cerebral artery or anterior cerebral artery on either  side. Right vertebral artery is dominant. Mild narrowing distal vertebral arteries bilaterally. Ectatic basilar artery without significant narrowing. Mild irregularity with regions of mild narrowing involving portions of the proximal, mid and distal aspect of the posterior cerebral artery bilaterally.   2D Echocardiogram  - Left ventricle: Mid and basal inferior wall hypokinesis Systolic   function was mildly to moderately reduced. The estimated ejection   fraction was in the range of 40% to 45%. Doppler parameters are   consistent with abnormal left ventricular relaxation (grade 1   diastolic dysfunction). - Left atrium: The atrium was mildly dilated. - Atrial septum: No defect or patent foramen ovale was identified.  EEG - This awake EEG is normal.   Clinical Correlation: A normal EEG does not exclude a clinical diagnosis of epilepsy. Clinical correlation is advised.   PHYSICAL EXAM  Temp:  [97.8 F (36.6 C)-98.9 F (37.2 C)] 98.4 F (36.9 C) (08/02 1329) Pulse Rate:  [62-82] 65 (08/02 1329) Resp:  [15-20] 18 (08/02 1329) BP: (88-141)/(53-108) 134/84 (08/02 1329) SpO2:  [98 %-100 %] 99 % (08/02 1329)  General - Well nourished, well developed, in no apparent distress.  Ophthalmologic - Sharp disc margins OU.   Cardiovascular - Regular rate and rhythm.  Mental Status -  Level of arousal and orientation to time, place, and person were intact. Language including expression, naming, repetition, comprehension was assessed and found intact. Fund of Knowledge was assessed and was intact.  Cranial Nerves II - XII - II - Visual field intact OU. III, IV, VI - Extraocular movements intact. V - Facial sensation intact bilaterally. VII - Facial movement intact bilaterally. VIII - Hearing & vestibular intact bilaterally. X - Palate elevates symmetrically. XI - Chin turning & shoulder shrug intact bilaterally. XII - Tongue protrusion intact.  Motor Strength - The patient's strength  was normal in all extremities and pronator drift was absent.  Bulk was normal and fasciculations were absent.   Motor Tone - Muscle tone was assessed at the neck and appendages and was normal.  Reflexes - The patient's reflexes were 1+ in all extremities and he had no pathological reflexes.  Sensory - Light touch, temperature/pinprick were assessed and were symmetrical.    Coordination - The patient had normal movements in the hands and feet with no ataxia or dysmetria.  Tremor was absent.  Gait and Station - deferred due to in procedure.   ASSESSMENT/PLAN Don [name] is a 70 y.o. male with history of stroke, pacemaker placement for CHF, ETOH abuse and tobacco abuse presenting with right facial droop and right sided numbness. He did not receive IV t-PA due to minimal symptoms.   L brain TIA vs infarct likely  MRI  / MRA  Has ICD  CTA head fetal R PCA. Mild plaque ICAs. No significant stenosis  CTA neck B ICA < 50%, no significant stenosis  2D Echo  EF 40-45%   TCD emboli detection negative.  LDL 38  HgbA1c pending  EEG normal  Lovenox 40 mg sq daily for VTE prophylaxis Diet Heart Room service appropriate? Yes; Fluid consistency: Thin  aspirin 81 mg daily prior to admission, now on aspirin 325 mg daily. Pt has been on DAPT in 2014 and 2015 for right numbness episodes but not sure why was only on ASA currently. Recommend to resume dual antiplatelet for both cardiac and stroke prevention.   Patient counseled to be compliant with his antithrombotic medications  Ongoing aggressive stroke risk factor management  Therapy recommendations:  pending   Disposition:  Pending. Anticipate return home  Hx stroke/TIA  05/2013 R arm and face numbness that resolved.  aspirin and plavix continued  07/2012 TIA vs. L brain subcortical infarct not seen on CT (has ICD) w/ right arm and facial numbness - continued on DAPT  Cardiomyopathy / CAD  Previous EF 20% then improved  to 45%  TTE this admission 40-45%  No need anticoagulation this time  TCD emboli detection negative.   Hypertension  Stable  Long-term BP goal normotensive  Hyperlipidemia  Home meds:  lipitor 40, resumed in hospital  LDL 38, at goal < 70  Continue statin at discharge  Tobacco abuse  Current smoker  Smoking cessation counseling provided  Nicotine patch provided  Pt is willing to quit  Alcohol abuse  advised to drink no more than 2 drink(s) a day.  Pt willing to quit completely.  Other Stroke Risk Factors  Advanced age  Obesity, Body mass index is 30.08 kg/m., recommend weight loss, diet and exercise as appropriate   Coronary artery disease  Chronic combined systolic and diastolic CHD, L BBB  Other Active Problems  Hypothyroid  Hyponatremia  Gout  Hospital day # 0  Neurology will sign off. Please call with questions. Pt will follow up with Marti Sleigh NP [facility] in about 2 months. Thanks for the consult.  Marvel Plan, MD PhD Stroke Neurology 09/19/2015 4:51 PM     To contact Stroke Continuity provider, please refer to [email or web address]. After hours, contact General Neurology

## 2015-09-19 NOTE — Care Management Obs Status (Signed)
MEDICARE OBSERVATION STATUS NOTIFICATION   Patient Details  Name: JAHEEM BIGWOOD MRN: [id number] Date of Birth: 31-Jan-1946   Medicare Observation Status Notification Given:  Yes    Kermit Balo, RN 09/19/2015, 4:38 PM

## 2015-09-19 NOTE — Progress Notes (Signed)
  Echocardiogram 2D Echocardiogram has been performed.  Don [name] 09/19/2015, 12:20 PM

## 2015-09-19 NOTE — Progress Notes (Signed)
EEG Completed; Results Pending  

## 2015-09-19 NOTE — Evaluation (Signed)
Occupational Therapy Evaluation Patient Details Name: Don [name] MRN: [id number] DOB: 1945/07/27 Today's Date: 09/19/2015    History of Present Illness 70 yo male R side facial droop R UE numbness and mild dizziness. PMH: ETOH abuse, tobacco abuse CAD CHF LBBB s/p ICD placement CVA, arthritis   Clinical Impression   PT admitted with workup underway to r/o CVA. Pt currently with functional limitiations due to the deficits listed below (see OT problem list). PTA was independent but with recent falls. Pt reports he did not seek medical attention after either fall.  Pt will benefit from skilled OT to increase their independence and safety with adls and balance to allow discharge HHOT with RW. Pt with multiple falls and decr awareness to deficits. Pt requesting to go home today.       Follow Up Recommendations  Home health OT    Equipment Recommendations  Other (comment) (RW)    Recommendations for Other Services       Precautions / Restrictions Precautions Precautions: Fall Precaution Comments: reports x2 falls within last 3 months ( once over the cat and once just black out and faint      Mobility Bed Mobility Overal bed mobility: Needs Assistance Bed Mobility: Supine to Sit;Sit to Supine     Supine to sit: Min assist Sit to supine: Min assist   General bed mobility comments: requires (A) to elevate trunk off surface  Transfers Overall transfer level: Needs assistance   Transfers: Sit to/from Stand Sit to Stand: Min guard              Balance Overall balance assessment: Needs assistance Sitting-balance support: Bilateral upper extremity supported;Feet supported Sitting balance-Leahy Scale: Fair     Standing balance support: No upper extremity supported;During functional activity Standing balance-Leahy Scale: Poor                              ADL Overall ADL's : Needs assistance/impaired     Grooming: Wash/dry hands;Wash/dry  face;Oral care;Minimal assistance;Standing Grooming Details (indicate cue type and reason): pt with vision occlusion LOB with bil UE reaching for environmental supports. pt reports this has been happening in recent months             Lower Body Dressing: Min guard;Sit to/from stand   Toilet Transfer: Min guard           Functional mobility during ADLs: Min guard General ADL Comments: Pt unsteady transfer from bed to sink level. pt with LOB at sink level with washing face. Pt reports having multiple falls and unknonw reason. Pt reports dizziness immediately with sitting and decr dizziness with gaze stabilization and prolonged sitting. pt dizzy again upon standing      Vision Vision Assessment?: No apparent visual deficits   Perception     Praxis      Pertinent Vitals/Pain Pain Assessment: 0-10 Pain Score: 7  Pain Location: neck Pain Descriptors / Indicators: Constant Pain Intervention(s): Premedicated before session;Repositioned;Monitored during session     Hand Dominance Right   Extremity/Trunk Assessment Upper Extremity Assessment Upper Extremity Assessment: Overall WFL for tasks assessed   Lower Extremity Assessment Lower Extremity Assessment: Overall WFL for tasks assessed   Cervical / Trunk Assessment Cervical / Trunk Assessment: Kyphotic   Communication Communication Communication: No difficulties   Cognition Arousal/Alertness: Awake/alert Behavior During Therapy: WFL for tasks assessed/performed Overall Cognitive Status: Within Functional Limits for tasks assessed  General Comments       Exercises       Shoulder Instructions      Home Living Family/patient expects to be discharged to:: Private residence Living Arrangements: Children (child just moved back at 51 yo) Available Help at Discharge: Family;Available 24 hours/day (just moved back from graduating college) Type of Home: Other(Comment) (townhome) Home Access:  Stairs to enter [employer] of Steps: 6 Entrance Stairs-Rails: Can reach both;Left;Right Home Layout: Multi-level;Bed/bath upstairs;1/2 bath on main level     Bathroom Shower/Tub: Producer, television/film/video: Standard     Home Equipment: Cane - single point   Additional Comments: dog ( Huckley ) half husky and half lab      Prior Functioning/Environment Level of Independence: Independent             OT Diagnosis: Generalized weakness;Acute pain   OT Problem List: Decreased strength;Decreased activity tolerance;Impaired balance (sitting and/or standing);Decreased safety awareness;Decreased knowledge of use of DME or AE;Decreased knowledge of precautions;Pain   OT Treatment/Interventions: Self-care/ADL training;Therapeutic exercise;DME and/or AE instruction;Therapeutic activities;Patient/family education;Balance training    OT Goals(Current goals can be found in the care plan section) Acute Rehab OT Goals Patient Stated Goal: to leave today OT Goal Formulation: With patient Time For Goal Achievement: 10/03/15 Potential to Achieve Goals: Good  OT Frequency: Min 2X/week   Barriers to D/C:            Co-evaluation              End of Session Equipment Utilized During Treatment: Gait belt Nurse Communication: Mobility status;Precautions  Activity Tolerance: Patient tolerated treatment well;Other (comment) (dizziness present) Patient left: in bed;with call bell/phone within reach;with bed alarm set   Time: 4098-1191 OT Time Calculation (min): 29 min Charges:  OT General Charges $OT Visit: 1 Procedure OT Evaluation $OT Eval Moderate Complexity: 1 Procedure OT Treatments $Self Care/Home Management : 8-22 mins G-Codes: OT G-codes **NOT FOR INPATIENT CLASS** Functional Assessment Tool Used: clinical judgement Functional Limitation: Self care Self Care Current Status (Y7829): At least 20 percent but less than 40 percent impaired, limited or  restricted Self Care Goal Status (F6213): At least 20 percent but less than 40 percent impaired, limited or restricted  Harolyn Rutherford 09/19/2015, 10:46 AM  Mateo Flow   OTR/L Pager: [phone] Office: [phone] .

## 2015-09-19 NOTE — Progress Notes (Signed)
Transcranial Doppler with monitoring has been completed. Ordering provider: Dr. Roda Shutters. 25 minutes monitoring with bilateral middle cerebral arteries.    Farrel Demark, RDMS, RVT [place], RDMS, RVT   09/19/2015

## 2015-09-19 NOTE — Progress Notes (Signed)
PROGRESS NOTE    Don [name]  FAO:[id number] DOB: 08-Aug-1945 DOA: 09/18/2015 PCP: [name], MD   Brief Narrative: Focal neuro deficit with right facial drop and right upper extremity numbness, did not received TPA due to improving symptoms. Neurologic workup in progress.   Assessment & Plan:   Active Problems:   ETOH abuse   TIA (transient ischemic attack)   History of stroke   HLD (hyperlipidemia)   Hypothyroid   Essential hypertension   Tobacco use   1. CVA. Will continue on antiplatelet therapy, neuro checks and physical therapy evaluation. Will continue antiplatelet therapy with asa/ plavix, statin therapy with atorvastatin. Continue blood pressure control. Follow neurology recommendations. Echo with ef 40 to 45 %, no mention of intracavitary thrombus.  2. ETHO abuse. No signs of withdrawal, will continue as needed lorazepam per protocol. Continue multivitamins.  3. Hypothyroid. Will continue levothyroxine 100 mcg day.  4. Hyponatremia/ Hypokalemia. Na stable at 134, will continue to follow on renal panel and electrolytes, K at 2,5 and cr at 0.75. Patient had total 80 meq this am of kcl, follow K and continue to replete if indicated, target K at 4.  5. HTN. Blood pressure systolic 134, continue benazapril and coreg.  6. Heart failure diastolic . Clinically euvolemic, continue blood pressure control. B blockade and ace inh. Continue furosemide.   7. Tobacco abuse. Continue nicotine replacement    DVT prophylaxis: lovenox Code Status: full Family Communication:  Disposition Plan:    Consultants:   neurology  Procedures:   Antimicrobials:   Subjective: Patient feeling better, improved strength and paresthesias on the right side, no nausea or vomiting, no dizziness or vertigo. No chest pain or dyspnea.   Objective: Vitals:   09/19/15 0400 09/19/15 0820 09/19/15 0910 09/19/15 1329  BP: 116/74 111/80 (!) 88/62 134/84  Pulse: 65 75 82 65    Resp:  16 18 18   Temp: 97.8 F (36.6 C)  98.6 F (37 C) 98.4 F (36.9 C)  TempSrc: Oral  Oral Oral  SpO2: 99% 98% 98% 99%  Weight:        Intake/Output Summary (Last 24 hours) at 09/19/15 1425 Last data filed at 09/19/15 0737  Gross per 24 hour  Intake             42.5 ml  Output             1900 ml  Net          -1857.5 ml   Filed Weights   09/18/15 1321 09/18/15 1322  Weight: 103.4 kg (227 lb 15.3 oz) 103.4 kg (227 lb 15.3 oz)    Examination:  General exam: no in pain or dyspnea E ENT; no pallor or icterus, oral mucosa moist. Respiratory system: Clear to auscultation. Respiratory effort normal. No wheezing, rales or rhonchi. Cardiovascular system: S1 & S2 heard, RRR. No JVD, murmurs, rubs, gallops or clicks. No pedal edema. Gastrointestinal system: Abdomen is nondistended, soft and nontender. No organomegaly or masses felt. Normal bowel sounds heard. Central nervous system: Alert and oriented. No focal neurological deficits. Extremities: Symmetric 5 x 5 power. Skin: No rashes, lesions or ulcers  Data Reviewed: I have personally reviewed following labs and imaging studies  CBC:  Recent Labs Lab 09/18/15 1324 09/18/15 1333 09/19/15 0520  WBC 7.7  --  6.6  NEUTROABS 5.5  --   --   HGB 12.6* 13.6 12.3*  HCT 37.8* 40.0 37.1*  MCV 94.0  --  94.6  PLT  166  --  153   Basic Metabolic Panel:  Recent Labs Lab 09/18/15 1324 09/18/15 1333 09/19/15 0520  NA 130* 131* 134*  K 3.8 3.8 2.5*  CL 95* 91* 96*  CO2 27  --  29  GLUCOSE 98 97 79  BUN <5* 4* <5*  CREATININE 0.93 0.80 0.75  CALCIUM 8.0*  --  7.5*   GFR: Estimated Creatinine Clearance: 110.1 mL/min (by C-G formula based on SCr of 0.8 mg/dL). Liver Function Tests:  Recent Labs Lab 09/18/15 1324  AST 48*  ALT 28  ALKPHOS 70  BILITOT 0.5  PROT 6.2*  ALBUMIN 3.3*   No results for input(s): LIPASE, AMYLASE in the last 168 hours. No results for input(s): AMMONIA in the last 168 hours. Coagulation  Profile:  Recent Labs Lab 09/18/15 1324  INR 1.01   Cardiac Enzymes: No results for input(s): CKTOTAL, CKMB, CKMBINDEX, TROPONINI in the last 168 hours. BNP (last 3 results) No results for input(s): PROBNP in the last 8760 hours. HbA1C: No results for input(s): HGBA1C in the last 72 hours. CBG:  Recent Labs Lab 09/18/15 1323  GLUCAP 93   Lipid Profile:  Recent Labs  09/19/15 0500  CHOL 119  HDL 71  LDLCALC 38  TRIG 49  CHOLHDL 1.7   Thyroid Function Tests: No results for input(s): TSH, T4TOTAL, FREET4, T3FREE, THYROIDAB in the last 72 hours. Anemia Panel: No results for input(s): VITAMINB12, FOLATE, FERRITIN, TIBC, IRON, RETICCTPCT in the last 72 hours. Sepsis Labs: No results for input(s): PROCALCITON, LATICACIDVEN in the last 168 hours.  No results found for this or any previous visit (from the past 240 hour(s)).       Radiology Studies: Ct Angio Head W/cm &/or Wo Cm  Result Date: 09/18/2015 CLINICAL DATA:  70 year old hypertensive male with right facial numbness and dizziness with headache beginning today. Symptoms have partially resolved. Subsequent encounter. EXAM: CT ANGIOGRAPHY HEAD AND NECK TECHNIQUE: Multidetector CT imaging of the head and neck was performed using the standard protocol during bolus administration of intravenous contrast. Multiplanar CT image reconstructions and MIPs were obtained to evaluate the vascular anatomy. Carotid stenosis measurements (when applicable) are obtained utilizing NASCET criteria, using the distal internal carotid diameter as the denominator. CONTRAST:  50 cc Isovue 370. COMPARISON:  09/18/2015 head CT. FINDINGS: CT HEAD Brain: No intracranial hemorrhage or CT evidence of large acute infarct. Chronic microvascular changes. Global atrophy without hydrocephalus. No intracranial enhancing lesion. Calvarium and skull base: Negative. Paranasal sinuses: Polypoid opacification maxillary sinuses greater on the right Orbits: Minimal  exophthalmos. CTA NECK Aortic arch: 3 vessel arch with scattered calcified plaque. Mild ectasia. Right carotid system: Plaque right carotid bifurcation/ proximal right internal carotid artery with less than 50% diameter stenosis. Ectatic distal cervical segment of the right internal carotid artery. Left carotid system: Plaque left carotid bifurcation/proximal left internal carotid artery with less than 50% maximal diameter stenosis. Ectatic distal cervical segment of the right internal carotid artery. Vertebral arteries:Right vertebral artery is dominant. Plaque with mild narrowing proximal right vertebral artery. Plaque with mild narrowing right subclavian artery. Skeleton: Scoliosis cervical spine upper thoracic spine convex left. Superimposed cervical spondylotic changes most notable C5-6. Other neck: Pacemaker enters from the left with 2 leads. No worrisome lung apical lesion or neck mass. CTA HEAD Anterior circulation: Plaque cavernous segment internal carotid artery with mild to slightly moderate narrowing bilaterally. Fetal contribution to the right posterior cerebral artery. No significant stenosis of either carotid terminus, M1 segment or A1 segment  of the middle cerebral artery or anterior cerebral artery on either side. Posterior circulation: Right vertebral artery is dominant. Mild narrowing distal vertebral arteries bilaterally. Ectatic basilar artery without significant narrowing. Mild irregularity with regions of mild narrowing involving portions of the proximal mid and distal aspect of the posterior cerebral artery bilaterally. Venous sinuses: Patent. Anatomic variants: As above. Delayed phase: As above. IMPRESSION: CT HEAD No intracranial hemorrhage or CT evidence of large acute infarct. Chronic microvascular changes. Global atrophy without hydrocephalus. No intracranial enhancing lesion. Polypoid opacification maxillary sinuses greater on the right CTA NECK Plaque carotid bifurcation/ proximal  internal carotid artery bilaterally with less than 50% diameter stenosis. Ectatic distal cervical segment of the internal carotid artery bilaterally. Right vertebral artery is dominant. Plaque with mild narrowing proximal right vertebral artery. Plaque with mild narrowing right subclavian artery. No significant stenosis left vertebral artery. CTA HEAD Plaque cavernous segment internal carotid artery with mild to slightly moderate narrowing bilaterally. Fetal contribution to the right posterior cerebral artery. No significant stenosis of either carotid terminus, M1 segment or A1 segment of the middle cerebral artery or anterior cerebral artery on either side. Right vertebral artery is dominant. Mild narrowing distal vertebral arteries bilaterally. Ectatic basilar artery without significant narrowing. Mild irregularity with regions of mild narrowing involving portions of the proximal, mid and distal aspect of the posterior cerebral artery bilaterally. Electronically Signed   By: Lacy Duverney M.D.   On: 09/18/2015 16:58   Ct Angio Neck W/cm &/or Wo/cm  Result Date: 09/18/2015 CLINICAL DATA:  70 year old hypertensive male with right facial numbness and dizziness with headache beginning today. Symptoms have partially resolved. Subsequent encounter. EXAM: CT ANGIOGRAPHY HEAD AND NECK TECHNIQUE: Multidetector CT imaging of the head and neck was performed using the standard protocol during bolus administration of intravenous contrast. Multiplanar CT image reconstructions and MIPs were obtained to evaluate the vascular anatomy. Carotid stenosis measurements (when applicable) are obtained utilizing NASCET criteria, using the distal internal carotid diameter as the denominator. CONTRAST:  50 cc Isovue 370. COMPARISON:  09/18/2015 head CT. FINDINGS: CT HEAD Brain: No intracranial hemorrhage or CT evidence of large acute infarct. Chronic microvascular changes. Global atrophy without hydrocephalus. No intracranial enhancing  lesion. Calvarium and skull base: Negative. Paranasal sinuses: Polypoid opacification maxillary sinuses greater on the right Orbits: Minimal exophthalmos. CTA NECK Aortic arch: 3 vessel arch with scattered calcified plaque. Mild ectasia. Right carotid system: Plaque right carotid bifurcation/ proximal right internal carotid artery with less than 50% diameter stenosis. Ectatic distal cervical segment of the right internal carotid artery. Left carotid system: Plaque left carotid bifurcation/proximal left internal carotid artery with less than 50% maximal diameter stenosis. Ectatic distal cervical segment of the right internal carotid artery. Vertebral arteries:Right vertebral artery is dominant. Plaque with mild narrowing proximal right vertebral artery. Plaque with mild narrowing right subclavian artery. Skeleton: Scoliosis cervical spine upper thoracic spine convex left. Superimposed cervical spondylotic changes most notable C5-6. Other neck: Pacemaker enters from the left with 2 leads. No worrisome lung apical lesion or neck mass. CTA HEAD Anterior circulation: Plaque cavernous segment internal carotid artery with mild to slightly moderate narrowing bilaterally. Fetal contribution to the right posterior cerebral artery. No significant stenosis of either carotid terminus, M1 segment or A1 segment of the middle cerebral artery or anterior cerebral artery on either side. Posterior circulation: Right vertebral artery is dominant. Mild narrowing distal vertebral arteries bilaterally. Ectatic basilar artery without significant narrowing. Mild irregularity with regions of mild narrowing involving portions of the  proximal mid and distal aspect of the posterior cerebral artery bilaterally. Venous sinuses: Patent. Anatomic variants: As above. Delayed phase: As above. IMPRESSION: CT HEAD No intracranial hemorrhage or CT evidence of large acute infarct. Chronic microvascular changes. Global atrophy without hydrocephalus. No  intracranial enhancing lesion. Polypoid opacification maxillary sinuses greater on the right CTA NECK Plaque carotid bifurcation/ proximal internal carotid artery bilaterally with less than 50% diameter stenosis. Ectatic distal cervical segment of the internal carotid artery bilaterally. Right vertebral artery is dominant. Plaque with mild narrowing proximal right vertebral artery. Plaque with mild narrowing right subclavian artery. No significant stenosis left vertebral artery. CTA HEAD Plaque cavernous segment internal carotid artery with mild to slightly moderate narrowing bilaterally. Fetal contribution to the right posterior cerebral artery. No significant stenosis of either carotid terminus, M1 segment or A1 segment of the middle cerebral artery or anterior cerebral artery on either side. Right vertebral artery is dominant. Mild narrowing distal vertebral arteries bilaterally. Ectatic basilar artery without significant narrowing. Mild irregularity with regions of mild narrowing involving portions of the proximal, mid and distal aspect of the posterior cerebral artery bilaterally. Electronically Signed   By: Lacy Duverney M.D.   On: 09/18/2015 16:58   Ct Head Code Stroke W/o Cm  Result Date: 09/18/2015 CLINICAL DATA:  Code stroke. 70 year old male with right-sided facial droop. Initial encounter. EXAM: CT HEAD WITHOUT CONTRAST TECHNIQUE: Contiguous axial images were obtained from the base of the skull through the vertex without intravenous contrast. COMPARISON:  09/04/2014 CT. FINDINGS: No intracranial hemorrhage. Chronic microvascular changes without CT evidence of large acute infarct. Tortuous vertebral arteries and basilar artery. Partial calcification cavernous segment internal carotid artery bilaterally. Global atrophy without hydrocephalus. No intracranial mass lesion noted on this unenhanced exam. Polypoid opacification maxillary sinuses more notable on the right. Mild mucosal thickening ethmoid sinus  air cells. No acute orbital abnormality.  Minimal exophthalmos. ASPECTS [facility] Stroke Program Early CT Score, http://[email or web address].[email or web address]) - Ganglionic level infarction (caudate, lentiform nuclei, internal capsule, insula, M1-M3 cortex): 7 - Supraganglionic infarction (M4-M6 cortex): 3 Total score (0-10): 10 IMPRESSION: 1. No intracranial hemorrhage. Chronic microvascular changes without CT evidence of large acute infarct. Global atrophy. Polypoid opacification maxillary sinuses greater on the right. Tortuous vertebral arteries and basilar artery. Partially calcified carotid arteries. 2. ASPECTS score 10 Electronically Signed   By: Lacy Duverney M.D.   On: 09/18/2015 13:30        Scheduled Meds: .  stroke: mapping our early stages of recovery book   Does not apply Once  . allopurinol  100 mg Oral Daily  . aspirin EC  325 mg Oral Daily  . atorvastatin  40 mg Oral Daily  . benazepril  10 mg Oral Daily  . carvedilol  6.25 mg Oral BID WC  . enoxaparin (LOVENOX) injection  40 mg Subcutaneous Q24H  . folic acid  1 mg Oral Daily  . furosemide  20 mg Oral Daily  . levothyroxine  100 mcg Oral QAC breakfast  . multivitamin with minerals  1 tablet Oral Daily  . nicotine  21 mg Transdermal Daily  . thiamine  100 mg Oral Daily   Continuous Infusions: . sodium chloride 75 mL/hr (09/19/15 0703)     LOS: 0 days     Mauricio Annett Gula, MD [facility] Pager [phone]  If 7PM-7AM, please contact night-coverage [email or web address] Password [email or web address] 09/19/2015, 2:25 PM

## 2015-09-19 NOTE — Procedures (Signed)
ELECTROENCEPHALOGRAM REPORT  Date of Study: 09/19/2015  Patient's Name: Don [name] MRN: [id number] Date of Birth: 31-Mar-1945  Referring Provider: Annie Main, NP  Clinical History: This is a 70 year old man with right-sided numbness and dizziness.  Medications: acetaminophen (TYLENOL) tablet 650 mg  allopurinol (ZYLOPRIM) tablet 100 mg  aspirin EC tablet 325 mg  atorvastatin (LIPITOR) tablet 40 mg  benazepril (LOTENSIN) tablet 10 mg  carvedilol (COREG) tablet 6.25 mg  folic acid (FOLVITE) tablet 1 mg  furosemide (LASIX) tablet 20 mg  levothyroxine (SYNTHROID, LEVOTHROID) tablet 100 mcg  thiamine (VITAMIN B-1) tablet 100 mg   Technical Summary: A multichannel digital EEG recording measured by the international 10-20 system with electrodes applied with paste and impedances below 5000 ohms performed in our laboratory with EKG monitoring in an awake patient.  Hyperventilation and photic stimulation were not performed.  The digital EEG was referentially recorded, reformatted, and digitally filtered in a variety of bipolar and referential montages for optimal display.    Description: The patient is awake during the recording.  During maximal wakefulness, there is a symmetric, medium voltage 9.5 Hz posterior dominant rhythm that attenuates with eye opening.  The record is symmetric. Hyperventilation and photic stimulation were not performed.  There were no epileptiform discharges or electrographic seizures seen.    EKG lead showed occasional extrasystolic beats.  Impression: This awake EEG is normal.    Clinical Correlation: A normal EEG does not exclude a clinical diagnosis of epilepsy. Clinical correlation is advised.   Patrcia Dolly, M.D.

## 2015-09-19 NOTE — Progress Notes (Signed)
CRITICAL VALUE ALERT  Critical value received:  Potassium 2.5  Date of notification:  8/2  Time of notification:  0614  Critical value read back:yes  Nurse who received alert:  Ilean Skill LPN  MD notified (1st page): [facility]  Time of first page:  [phone]  MD notified (2nd page):  Time of second page:  Responding MD:  [facility]  Time MD responded:  [phone]

## 2015-09-19 NOTE — Evaluation (Addendum)
Physical Therapy Evaluation Patient Details Name: Don [name] MRN: [id number] DOB: Aug 01, 1945 Today's Date: 09/19/2015   History of Present Illness  70 yo male R side facial droop R UE numbness and mild dizziness. PMH: ETOH abuse, tobacco abuse CAD CHF LBBB s/p ICD placement CVA, arthritis.  Clinical Impression  Patient presents with generalized weakness, deconditioning, dizziness, impaired balance and numbness RUE impacting mobility. Pt reports dizziness with change in position that resolves after 1-2 minutes. See orthostatic vitals below. Supine BP 111/73, 61 bpm + dizziness Sitting BP 107/77, 64 bpm  + dizziness Standing BP 97/64, 66 bpm  + dizziness Sitting post ambulation 119/75  Tolerated gait training with close Min guard assist for safety. Most likely would benefit from use of RW for support until strength improves. Discussed importance of waiting after change in position prior to mobility due to drop In BP. Pt will have 18 y/o daughter present at home at discharge for support. Will follow acutely to maximize independence and mobility prior to return home.   Follow Up Recommendations Home health PT;Supervision - Intermittent    Equipment Recommendations  None recommended by PT (can borrow a RW if needed.)    Recommendations for Other Services       Precautions / Restrictions Precautions Precautions: Fall Precaution Comments: reports x2 falls within last 3 months ( once over the cat and once just black out and faint) Restrictions Weight Bearing Restrictions: No      Mobility  Bed Mobility Overal bed mobility: Needs Assistance Bed Mobility: Supine to Sit;Sit to Supine     Supine to sit: Modified independent (Device/Increase time) Sit to supine: Modified independent (Device/Increase time)   General bed mobility comments: Increased time and use of rail to get to EOB. Dizziness.   Transfers Overall transfer level: Needs assistance Equipment used:  None Transfers: Sit to/from Stand Sit to Stand: Min guard         General transfer comment: Min guard for safety due to unsteadiness but no LOB. Stood from [employer]. Dizziness.  Ambulation/Gait Ambulation/Gait assistance: Min guard Ambulation Distance (Feet): 120 Feet Assistive device: None Gait Pattern/deviations: Step-through pattern;Decreased stride length;Staggering left;Staggering right Gait velocity: decreased Gait velocity interpretation: Below normal speed for age/gender General Gait Details: Slow, unsteady gait with staggering noted right/left. Reports feeling weak. No overt LOB.   Stairs            Wheelchair Mobility    Modified Rankin (Stroke Patients Only) Modified Rankin (Stroke Patients Only) Pre-Morbid Rankin Score: Slight disability Modified Rankin: Moderately severe disability     Balance Overall balance assessment: Needs assistance Sitting-balance support: Feet supported;No upper extremity supported Sitting balance-Leahy Scale: Fair     Standing balance support: During functional activity Standing balance-Leahy Scale: Fair Standing balance comment: Able to stand to get BP taken x2 without LOB or difficulty. Reports dizziness that improves within 1 min.                             Pertinent Vitals/Pain Pain Assessment: No/denies pain Pain Score: 7  Pain Location: neck Pain Descriptors / Indicators: Constant Pain Intervention(s): Premedicated before session;Repositioned;Monitored during session    Home Living Family/patient expects to be discharged to:: Private residence Living Arrangements: Children (61 yo moved back home) Available Help at Discharge: Family;Available 24 hours/day Type of Home: Other(Comment) (townhome) Home Access: Stairs to enter Entrance Stairs-Rails: Can reach both;Left;Right Entrance Stairs-Number of Steps: 6 Home Layout: Multi-level;Bed/bath upstairs;1/2 bath  on main level Home Equipment: Cane - single  point Additional Comments: dog ( Huckley ) half husky and half lab    Prior Function Level of Independence: Independent         Comments: Reports not feeling well lately, laying in bed a lot. Gets dizzy in the morning getting out of bed and sometimes standing but it resolves in 1 minute or so. Reports difficulty with stair negotiation due to SOB.     Hand Dominance   Dominant Hand: Right    Extremity/Trunk Assessment   Upper Extremity Assessment: Defer to OT evaluation           Lower Extremity Assessment: Overall WFL for tasks assessed (Grossly ~4/5 throughout.)      Cervical / Trunk Assessment: Kyphotic  Communication   Communication: No difficulties  Cognition Arousal/Alertness: Awake/alert Behavior During Therapy: WFL for tasks assessed/performed Overall Cognitive Status: Within Functional Limits for tasks assessed                      General Comments      Exercises        Assessment/Plan    PT Assessment Patient needs continued PT services  PT Diagnosis Difficulty walking;Generalized weakness   PT Problem List Decreased strength;Decreased mobility;Decreased activity tolerance;Decreased balance;Pain;Decreased knowledge of use of DME  PT Treatment Interventions Therapeutic exercise;Gait training;Therapeutic activities;Functional mobility training;Patient/family education;Stair training;Balance training;DME instruction   PT Goals (Current goals can be found in the Care Plan section) Acute Rehab PT Goals Patient Stated Goal: to get back to walking PT Goal Formulation: With patient Time For Goal Achievement: 10/03/15 Potential to Achieve Goals: Good    Frequency Min 3X/week   Barriers to discharge Inaccessible home environment stairs to get into home and to bedroom    Co-evaluation               End of Session Equipment Utilized During Treatment: Gait belt Activity Tolerance: Patient tolerated treatment well Patient left: in bed;with  call bell/phone within reach;with bed alarm set Nurse Communication: Mobility status         Time: 1607-3710 PT Time Calculation (min) (ACUTE ONLY): 26 min   Charges:   PT Evaluation $PT Eval Moderate Complexity: 1 Procedure PT Treatments $Gait Training: 8-22 mins   PT G Codes:        Don [name] A Fumi Guadron 09/19/2015, 12:54 PM  Mylo Red, PT, DPT [phone]

## 2015-09-20 DIAGNOSIS — G459 Transient cerebral ischemic attack, unspecified: Secondary | ICD-10-CM | POA: Diagnosis not present

## 2015-09-20 DIAGNOSIS — G458 Other transient cerebral ischemic attacks and related syndromes: Secondary | ICD-10-CM | POA: Diagnosis not present

## 2015-09-20 DIAGNOSIS — I10 Essential (primary) hypertension: Secondary | ICD-10-CM | POA: Diagnosis not present

## 2015-09-20 DIAGNOSIS — F1010 Alcohol abuse, uncomplicated: Secondary | ICD-10-CM | POA: Diagnosis not present

## 2015-09-20 LAB — BASIC METABOLIC PANEL
Anion gap: 5 (ref 5–15)
CHLORIDE: 103 mmol/L (ref 101–111)
CO2: 31 mmol/L (ref 22–32)
CREATININE: 0.66 mg/dL (ref 0.61–1.24)
Calcium: 8.2 mg/dL — ABNORMAL LOW (ref 8.9–10.3)
GFR calc Af Amer: >60 mL/min (ref >60)
GFR calc non Af Amer: >60 mL/min (ref >60)
GLUCOSE: 91 mg/dL (ref 65–99)
POTASSIUM: 4.1 mmol/L (ref 3.5–5.1)
Sodium: 139 mmol/L (ref 135–145)

## 2015-09-20 MED ORDER — POTASSIUM CHLORIDE ER 10 MEQ PO TBCR: 20.0000 meq | EXTENDED_RELEASE_TABLET | Freq: Every day | ORAL | 0 refills | Status: DC

## 2015-09-20 MED ORDER — ASPIRIN 81 MG PO TABS: 81.0000 mg | ORAL_TABLET | Freq: Every day | ORAL | 0 refills | Status: DC

## 2015-09-20 MED ORDER — CLOPIDOGREL BISULFATE 75 MG PO TABS: 75.0000 mg | ORAL_TABLET | Freq: Every day | ORAL | 0 refills | Status: DC

## 2015-09-20 NOTE — Care Management Note (Signed)
Case Management Note  Patient Details  Name: Don [name] MRN: [id number] Date of Birth: [birth year]  Subjective/Objective:                    Action/Plan: Patient discharging home with orders for [facility] services and DME. CM met with the patient and provided him a list of [place] agencies in the [place] area. He selected [place]. Butch Penny with [facility] notified and accepted the referral. Pt also with orders for rolling walker. Jermaine with [facility] DME notified and will deliver the equipment to the room. Bedside RN updated.    Expected Discharge Date:                  Expected Discharge Plan:  [place]  In-House Referral:     Discharge planning Services  CM Consult  Post Acute Care Choice:  Durable Medical Equipment, Home Health Choice offered to:  Patient  DME Arranged:  Walker rolling DME Agency:  [place] Arranged:  PT [facility] Agency:  [place]  Status of Service:  Completed, signed off  If discussed at [place] of Stay Meetings, dates discussed:    Additional Comments:  Pollie Friar, RN 09/20/2015, 11:34 AM

## 2015-09-20 NOTE — Progress Notes (Signed)
Occupational Therapy Treatment Patient Details Name: Don [name] MRN: [id number] DOB: Aug 15, 1945 Today's Date: 09/20/2015    History of present illness 70 yo male R side facial droop R UE numbness and mild dizziness. PMH: ETOH abuse, tobacco abuse CAD CHF LBBB s/p ICD placement CVA, arthritis.   OT comments  Pt reports no dizziness today with positional changes. Pt able to perform shower transfer and higher level balance activities without unsteadiness or LOB. Pt able to verbalize fall prevention techniques for dizziness that he already uses at home. Updated d/c plan to home without OT follow up. No further acute OT needs identified; signing off at this time. Please re-consult if needs change.    Follow Up Recommendations  No OT follow up    Equipment Recommendations  None recommended by OT    Recommendations for Other Services      Precautions / Restrictions Precautions Precautions: Fall Precaution Comments: reports x2 falls within last 3 months ( once over the cat and once just black out and faint) Restrictions Weight Bearing Restrictions: No       Mobility Bed Mobility Overal bed mobility: Modified Independent             General bed mobility comments: Increased time. HOB flat, no use of bed rail.  Transfers Overall transfer level: Needs assistance Equipment used: None Transfers: Sit to/from Stand Sit to Stand: Supervision         General transfer comment: Supervision for safety. No unsteadiness noted. No c/o of dizziness.    Balance Overall balance assessment: Needs assistance Sitting-balance support: Feet supported;No upper extremity supported Sitting balance-Leahy Scale: Good     Standing balance support: No upper extremity supported;During functional activity Standing balance-Leahy Scale: Good               High level balance activites: Head turns;Sudden stops;Turns;Direction changes;Backward walking;Other (comment) (picking items up off  ground, reaching up high) High Level Balance Comments: Pt able to perform higher level balance activities without LOB or unsteadiness.   ADL Overall ADL's : Needs assistance/impaired                                 Tub/ Shower Transfer: Supervision/safety;Ambulation Tub/Shower Transfer Details (indicate cue type and reason): No unsteadiness or LOB noted. Functional mobility during ADLs: Supervision/safety General ADL Comments: Pt able to perform higher level balance activities without unsteadiness or LOB. Pt reports no dizziness with positional changes today. Pt able to verbalize techniques for when dizziness occurs to prevent falls.      Vision                     Perception     Praxis      Cognition   Behavior During Therapy: WFL for tasks assessed/performed Overall Cognitive Status: Within Functional Limits for tasks assessed                       Extremity/Trunk Assessment               Exercises     Shoulder Instructions       General Comments      Pertinent Vitals/ Pain       Pain Assessment: No/denies pain  Home Living  Prior Functioning/Environment              Frequency       Progress Toward Goals  OT Goals(current goals can now be found in the care plan section)  Progress towards OT goals: Goals met/education completed, patient discharged from OT  Acute Rehab OT Goals Patient Stated Goal: go home OT Goal Formulation: All assessment and education complete, DC therapy  Plan Discharge plan needs to be updated;All goals met and education completed, patient discharged from OT services    Co-evaluation                 End of Session Equipment Utilized During Treatment: Gait belt   Activity Tolerance Patient tolerated treatment well   Patient Left in bed;with call bell/phone within reach;with bed alarm set   Nurse Communication           Time: 6440-3474 OT Time Calculation (min): 10 min  Charges: OT General Charges $OT Visit: 1 Procedure OT Treatments $Self Care/Home Management : 8-22 mins  Binnie Kand M.S., OTR/L Pager: [phone]  09/20/2015, 10:30 AM

## 2015-09-20 NOTE — Progress Notes (Signed)
Discharge orders received.  Discharge instructions and follow-up appointments reviewed with the patient and his daughter.  VSS upon discharge.  IV removed and education complete.  All belongings sent with the patient.  Transported out via wheelchair.   Jazelyn Sipe M, RN 

## 2015-09-20 NOTE — Discharge Summary (Signed)
Physician Discharge Summary  Don [name] AES:[id number] DOB: 1946/01/22 DOA: 09/18/2015  PCP: [name], MD  Admit date: 09/18/2015 Discharge date: 09/20/2015  Admitted From: Home Disposition:  Home  Recommendations for Outpatient Follow-up:  1. Follow up with PCP in 1-2 weeks  Home Health: yes Equipment/Devices: rolling walker   Discharge Condition: stable CODE STATUS: full Diet recommendation: Heart Healthy    Brief/Interim Summary: This is a 70 year old male presents to hospital due to right facial droop, right upper and lower extremity weakness. The symptoms occur abruptly associated with mild dizziness. On his initial physical examination his blood pressure was 128/89, heart rate 68, respiratory 16, oxygen saturation 99%, his oral mucosa was moist, neck was supple lungs were clear to auscultation bilaterally, heart S1-S2 present rhythmic, lower extremity no edema. His cranial nerves II-12 were grossly intact, his strength was 5 out of 5 bilaterally. His serum sodium was 130, potassium 3.8, creatinine 0.93, his white count was 7.7 with hemoglobin 12.6. EKG showed left axis deviation with a possible left anterior fascicular block, otherwise sinus rhythm. CT head with CT angiography was negative for acute infarct.   Patient was admitted to the hospital with the working diagnosis of focal neurologic deficit suspected TIA to rule out CVA.  1. Transitory ischemic attack. Patient's symptoms improved, he was evaluated by physical therapy who recommend home PT and rolling walker. Echocardiogram was negative for intracardiac thrombus left ventricle ejection fraction was 40-45%, with mid and basal inferior wall hypokinesis. Patient was continued on aspirin and Plavix was added to his antiplatelet therapy. Continued on atorvastatin 40 mg daily. Neurology was consulted. Patient had a transcranial Doppler, which show no spontaneous high intensity transient signals.   2. Heart  failure. Systolic dysfunction, with ischemic cardiomyopathy, patient remained euvolemic, he was continued on furosemide, benazapril and carvedilol.  3. Hypokalemia. Probably due to diuretic use, patient will continue potassium supplements 20 meq daily, he should have a close follow-up on his kidney function and electrolytes.  4. Hypothyroidism. Patient will be continue levothyroxine 100 mcg daily. Clinically patient remained euthyroid.  5. Alcohol abuse. No signs of withdrawal symptoms.  Discharge Diagnoses:  Active Problems:   ETOH abuse   Transient cerebral ischemia   History of stroke   HLD (hyperlipidemia)   Hypothyroid   Essential hypertension   Tobacco use    Discharge Instructions  Discharge Instructions    Ambulatory referral to Neurology    Complete by:  As directed   Pt will follow up with carolyn Daphine Deutscher NP at [facility] in about 2 months. Thanks.   Diet - low sodium heart healthy    Complete by:  As directed   Discharge instructions    Complete by:  As directed   Please follow with primary care in 7 days.   Face-to-face encounter (required for Medicare/Medicaid patients)    Complete by:  As directed   I York Ram Siniyah Evangelist certify that this patient is under my care and that I, or a nurse practitioner or physician's assistant working with me, had a face-to-face encounter that meets the physician face-to-face encounter requirements with this patient on 09/20/2015. The encounter with the patient was in whole, or in part for the following medical condition(s) which is the primary reason for home health care (List medical condition): acute cva   The encounter with the patient was in whole, or in part, for the following medical condition, which is the primary reason for home health care:  cva   I certify that,  based on my findings, the following services are medically necessary home health services:  Physical therapy   Reason for Medically Necessary Home Health Services:  Skilled  Nursing- Teaching of Disease Process/Symptom Management   My clinical findings support the need for the above services:  Unable to leave home safely without assistance and/or assistive device   Further, I certify that my clinical findings support that this patient is homebound due to:  Unsafe ambulation due to balance issues   For home use only DME 4 wheeled rolling walker with seat    Complete by:  As directed   Home Health    Complete by:  As directed   To provide the following care/treatments:  PT   Increase activity slowly    Complete by:  As directed       Medication List    TAKE these medications   allopurinol 100 MG tablet Commonly known as:  ZYLOPRIM Take 100 mg by mouth daily.   aspirin 81 MG tablet Take 1 tablet (81 mg total) by mouth daily.   atorvastatin 40 MG tablet Commonly known as:  LIPITOR Take 40 mg by mouth daily.   benazepril 10 MG tablet Commonly known as:  LOTENSIN Take 10 mg by mouth daily.   calcium carbonate 500 MG chewable tablet Commonly known as:  TUMS - dosed in mg elemental calcium Chew 1 tablet by mouth daily.   carvedilol 6.25 MG tablet Commonly known as:  COREG Take 6.25 mg by mouth 2 (two) times daily with a meal.   Cholecalciferol 4000 units Caps Take 1 capsule by mouth daily.   clopidogrel 75 MG tablet Commonly known as:  PLAVIX Take 1 tablet (75 mg total) by mouth daily.   furosemide 20 MG tablet Commonly known as:  LASIX Take 20 mg by mouth daily.   levothyroxine 100 MCG tablet Commonly known as:  SYNTHROID, LEVOTHROID Take 100 mcg by mouth daily before breakfast.   nitroGLYCERIN 0.4 MG SL tablet Commonly known as:  NITROSTAT Place 0.4 mg under the tongue every 5 (five) minutes as needed for chest pain.   OVER THE COUNTER MEDICATION Take 1 tablet by mouth 2 (two) times daily. Multi-allergy tablet   potassium chloride 10 MEQ tablet Commonly known as:  K-DUR Take 2 tablets (20 mEq total) by mouth daily.      Follow-up  Information    Nilda Riggs, NP. Schedule an appointment as soon as possible for a visit in 2 month(s).   Specialty:  Family Medicine Contact information: [address] [place] [address] [phone]          Allergies  Allergen Reactions  . Sulfa Antibiotics Nausea And Vomiting    Consultations:  neurology   Procedures/Studies: Ct Angio Head W/cm &/or Wo Cm  Result Date: 09/18/2015 CLINICAL DATA:  70 year old hypertensive male with right facial numbness and dizziness with headache beginning today. Symptoms have partially resolved. Subsequent encounter. EXAM: CT ANGIOGRAPHY HEAD AND NECK TECHNIQUE: Multidetector CT imaging of the head and neck was performed using the standard protocol during bolus administration of intravenous contrast. Multiplanar CT image reconstructions and MIPs were obtained to evaluate the vascular anatomy. Carotid stenosis measurements (when applicable) are obtained utilizing NASCET criteria, using the distal internal carotid diameter as the denominator. CONTRAST:  50 cc Isovue 370. COMPARISON:  09/18/2015 head CT. FINDINGS: CT HEAD Brain: No intracranial hemorrhage or CT evidence of large acute infarct. Chronic microvascular changes. Global atrophy without hydrocephalus. No intracranial enhancing lesion. Calvarium  and skull base: Negative. Paranasal sinuses: Polypoid opacification maxillary sinuses greater on the right Orbits: Minimal exophthalmos. CTA NECK Aortic arch: 3 vessel arch with scattered calcified plaque. Mild ectasia. Right carotid system: Plaque right carotid bifurcation/ proximal right internal carotid artery with less than 50% diameter stenosis. Ectatic distal cervical segment of the right internal carotid artery. Left carotid system: Plaque left carotid bifurcation/proximal left internal carotid artery with less than 50% maximal diameter stenosis. Ectatic distal cervical segment of the right internal carotid artery. Vertebral  arteries:Right vertebral artery is dominant. Plaque with mild narrowing proximal right vertebral artery. Plaque with mild narrowing right subclavian artery. Skeleton: Scoliosis cervical spine upper thoracic spine convex left. Superimposed cervical spondylotic changes most notable C5-6. Other neck: Pacemaker enters from the left with 2 leads. No worrisome lung apical lesion or neck mass. CTA HEAD Anterior circulation: Plaque cavernous segment internal carotid artery with mild to slightly moderate narrowing bilaterally. Fetal contribution to the right posterior cerebral artery. No significant stenosis of either carotid terminus, M1 segment or A1 segment of the middle cerebral artery or anterior cerebral artery on either side. Posterior circulation: Right vertebral artery is dominant. Mild narrowing distal vertebral arteries bilaterally. Ectatic basilar artery without significant narrowing. Mild irregularity with regions of mild narrowing involving portions of the proximal mid and distal aspect of the posterior cerebral artery bilaterally. Venous sinuses: Patent. Anatomic variants: As above. Delayed phase: As above. IMPRESSION: CT HEAD No intracranial hemorrhage or CT evidence of large acute infarct. Chronic microvascular changes. Global atrophy without hydrocephalus. No intracranial enhancing lesion. Polypoid opacification maxillary sinuses greater on the right CTA NECK Plaque carotid bifurcation/ proximal internal carotid artery bilaterally with less than 50% diameter stenosis. Ectatic distal cervical segment of the internal carotid artery bilaterally. Right vertebral artery is dominant. Plaque with mild narrowing proximal right vertebral artery. Plaque with mild narrowing right subclavian artery. No significant stenosis left vertebral artery. CTA HEAD Plaque cavernous segment internal carotid artery with mild to slightly moderate narrowing bilaterally. Fetal contribution to the right posterior cerebral artery. No  significant stenosis of either carotid terminus, M1 segment or A1 segment of the middle cerebral artery or anterior cerebral artery on either side. Right vertebral artery is dominant. Mild narrowing distal vertebral arteries bilaterally. Ectatic basilar artery without significant narrowing. Mild irregularity with regions of mild narrowing involving portions of the proximal, mid and distal aspect of the posterior cerebral artery bilaterally. Electronically Signed   By: Lacy Duverney M.D.   On: 09/18/2015 16:58   Ct Angio Neck W/cm &/or Wo/cm  Result Date: 09/18/2015 CLINICAL DATA:  70 year old hypertensive male with right facial numbness and dizziness with headache beginning today. Symptoms have partially resolved. Subsequent encounter. EXAM: CT ANGIOGRAPHY HEAD AND NECK TECHNIQUE: Multidetector CT imaging of the head and neck was performed using the standard protocol during bolus administration of intravenous contrast. Multiplanar CT image reconstructions and MIPs were obtained to evaluate the vascular anatomy. Carotid stenosis measurements (when applicable) are obtained utilizing NASCET criteria, using the distal internal carotid diameter as the denominator. CONTRAST:  50 cc Isovue 370. COMPARISON:  09/18/2015 head CT. FINDINGS: CT HEAD Brain: No intracranial hemorrhage or CT evidence of large acute infarct. Chronic microvascular changes. Global atrophy without hydrocephalus. No intracranial enhancing lesion. Calvarium and skull base: Negative. Paranasal sinuses: Polypoid opacification maxillary sinuses greater on the right Orbits: Minimal exophthalmos. CTA NECK Aortic arch: 3 vessel arch with scattered calcified plaque. Mild ectasia. Right carotid system: Plaque right carotid bifurcation/ proximal right internal carotid artery  with less than 50% diameter stenosis. Ectatic distal cervical segment of the right internal carotid artery. Left carotid system: Plaque left carotid bifurcation/proximal left internal  carotid artery with less than 50% maximal diameter stenosis. Ectatic distal cervical segment of the right internal carotid artery. Vertebral arteries:Right vertebral artery is dominant. Plaque with mild narrowing proximal right vertebral artery. Plaque with mild narrowing right subclavian artery. Skeleton: Scoliosis cervical spine upper thoracic spine convex left. Superimposed cervical spondylotic changes most notable C5-6. Other neck: Pacemaker enters from the left with 2 leads. No worrisome lung apical lesion or neck mass. CTA HEAD Anterior circulation: Plaque cavernous segment internal carotid artery with mild to slightly moderate narrowing bilaterally. Fetal contribution to the right posterior cerebral artery. No significant stenosis of either carotid terminus, M1 segment or A1 segment of the middle cerebral artery or anterior cerebral artery on either side. Posterior circulation: Right vertebral artery is dominant. Mild narrowing distal vertebral arteries bilaterally. Ectatic basilar artery without significant narrowing. Mild irregularity with regions of mild narrowing involving portions of the proximal mid and distal aspect of the posterior cerebral artery bilaterally. Venous sinuses: Patent. Anatomic variants: As above. Delayed phase: As above. IMPRESSION: CT HEAD No intracranial hemorrhage or CT evidence of large acute infarct. Chronic microvascular changes. Global atrophy without hydrocephalus. No intracranial enhancing lesion. Polypoid opacification maxillary sinuses greater on the right CTA NECK Plaque carotid bifurcation/ proximal internal carotid artery bilaterally with less than 50% diameter stenosis. Ectatic distal cervical segment of the internal carotid artery bilaterally. Right vertebral artery is dominant. Plaque with mild narrowing proximal right vertebral artery. Plaque with mild narrowing right subclavian artery. No significant stenosis left vertebral artery. CTA HEAD Plaque cavernous segment  internal carotid artery with mild to slightly moderate narrowing bilaterally. Fetal contribution to the right posterior cerebral artery. No significant stenosis of either carotid terminus, M1 segment or A1 segment of the middle cerebral artery or anterior cerebral artery on either side. Right vertebral artery is dominant. Mild narrowing distal vertebral arteries bilaterally. Ectatic basilar artery without significant narrowing. Mild irregularity with regions of mild narrowing involving portions of the proximal, mid and distal aspect of the posterior cerebral artery bilaterally. Electronically Signed   By: Lacy Duverney M.D.   On: 09/18/2015 16:58   Ct Head Code Stroke W/o Cm  Result Date: 09/18/2015 CLINICAL DATA:  Code stroke. 70 year old male with right-sided facial droop. Initial encounter. EXAM: CT HEAD WITHOUT CONTRAST TECHNIQUE: Contiguous axial images were obtained from the base of the skull through the vertex without intravenous contrast. COMPARISON:  09/04/2014 CT. FINDINGS: No intracranial hemorrhage. Chronic microvascular changes without CT evidence of large acute infarct. Tortuous vertebral arteries and basilar artery. Partial calcification cavernous segment internal carotid artery bilaterally. Global atrophy without hydrocephalus. No intracranial mass lesion noted on this unenhanced exam. Polypoid opacification maxillary sinuses more notable on the right. Mild mucosal thickening ethmoid sinus air cells. No acute orbital abnormality.  Minimal exophthalmos. ASPECTS [facility] Stroke Program Early CT Score, [email or web address]) - Ganglionic level infarction (caudate, lentiform nuclei, internal capsule, insula, M1-M3 cortex): 7 - Supraganglionic infarction (M4-M6 cortex): 3 Total score (0-10): 10 IMPRESSION: 1. No intracranial hemorrhage. Chronic microvascular changes without CT evidence of large acute infarct. Global atrophy. Polypoid opacification maxillary sinuses greater on the right.  Tortuous vertebral arteries and basilar artery. Partially calcified carotid arteries. 2. ASPECTS score 10 Electronically Signed   By: Lacy Duverney M.D.   On: 09/18/2015 13:30       Subjective: Patient is feeling better, his neuro  deficit is improving, patient has been able tablet with the help of physical therapy and rolling walker. Denies any shortness of breath or chest pain   Discharge Exam: Vitals:   09/20/15 0533 09/20/15 0905  BP: (!) 154/77 117/85  Pulse: 72 71  Resp: 20 16  Temp: 98 F (36.7 C) 98.6 F (37 C)   Vitals:   09/19/15 2313 09/20/15 0118 09/20/15 0533 09/20/15 0905  BP: (!) 155/90 139/88 (!) 154/77 117/85  Pulse: 74 75 72 71  Resp:  20 20 16   Temp:  98.1 F (36.7 C) 98 F (36.7 C) 98.6 F (37 C)  TempSrc:  Oral Oral Oral  SpO2:  99% 98% 97%  Weight:        General: Pt is alert, awake, not in acute distress Cardiovascular: RRR, S1/S2 +, no rubs, no gallops Respiratory: CTA bilaterally, no wheezing, no rhonchi Abdominal: Soft, NT, ND, bowel sounds + Extremities: no edema, no cyanosis    The results of significant diagnostics from this hospitalization (including imaging, microbiology, ancillary and laboratory) are listed below for reference.     Microbiology: No results found for this or any previous visit (from the past 240 hour(s)).   Labs: BNP (last 3 results) No results for input(s): BNP in the last 8760 hours. Basic Metabolic Panel:  Recent Labs Lab 09/18/15 1324 09/18/15 1333 09/19/15 0520 09/19/15 1838 09/20/15 0525  NA 130* 131* 134*  --  139  K 3.8 3.8 2.5* 3.7 4.1  CL 95* 91* 96*  --  103  CO2 27  --  29  --  31  GLUCOSE 98 97 79  --  91  BUN <5* 4* <5*  --  <5*  CREATININE 0.93 0.80 0.75  --  0.66  CALCIUM 8.0*  --  7.5*  --  8.2*   Liver Function Tests:  Recent Labs Lab 09/18/15 1324  AST 48*  ALT 28  ALKPHOS 70  BILITOT 0.5  PROT 6.2*  ALBUMIN 3.3*   No results for input(s): LIPASE, AMYLASE in the last 168  hours. No results for input(s): AMMONIA in the last 168 hours. CBC:  Recent Labs Lab 09/18/15 1324 09/18/15 1333 09/19/15 0520  WBC 7.7  --  6.6  NEUTROABS 5.5  --   --   HGB 12.6* 13.6 12.3*  HCT 37.8* 40.0 37.1*  MCV 94.0  --  94.6  PLT 166  --  153   Cardiac Enzymes: No results for input(s): CKTOTAL, CKMB, CKMBINDEX, TROPONINI in the last 168 hours. BNP: Invalid input(s): POCBNP CBG:  Recent Labs Lab 09/18/15 1323  GLUCAP 93   D-Dimer No results for input(s): DDIMER in the last 72 hours. Hgb A1c  Recent Labs  09/19/15 0520  HGBA1C 4.9   Lipid Profile  Recent Labs  09/19/15 0500  CHOL 119  HDL 71  LDLCALC 38  TRIG 49  CHOLHDL 1.7   Thyroid function studies No results for input(s): TSH, T4TOTAL, T3FREE, THYROIDAB in the last 72 hours.  Invalid input(s): FREET3 Anemia work up No results for input(s): VITAMINB12, FOLATE, FERRITIN, TIBC, IRON, RETICCTPCT in the last 72 hours. Urinalysis    Component Value Date/Time   COLORURINE YELLOW 09/18/2015 1343   APPEARANCEUR CLEAR 09/18/2015 1343   LABSPEC 1.011 09/18/2015 1343   PHURINE 5.0 09/18/2015 1343   GLUCOSEU NEGATIVE 09/18/2015 1343   HGBUR NEGATIVE 09/18/2015 1343   BILIRUBINUR NEGATIVE 09/18/2015 1343   KETONESUR NEGATIVE 09/18/2015 1343   PROTEINUR NEGATIVE 09/18/2015 1343  UROBILINOGEN 1.0 09/04/2014 1554   NITRITE NEGATIVE 09/18/2015 1343   LEUKOCYTESUR NEGATIVE 09/18/2015 1343   Sepsis Labs Invalid input(s): PROCALCITONIN,  WBC,  LACTICIDVEN Microbiology No results found for this or any previous visit (from the past 240 hour(s)).   Time coordinating discharge: 45 minutes  SIGNED:   Coralie Keens, MD  [facility] 09/20/2015, 10:19 AM Pager   If 7PM-7AM, please contact night-coverage [email or web address] Password [email or web address]

## 2015-09-21 NOTE — Progress Notes (Signed)
PT eval addendum- added G-codes    09/22/15 1302  PT G-Codes **NOT FOR INPATIENT CLASS**  Functional Assessment Tool Used clinical judgment  Functional Limitation Mobility: Walking and moving around  Mobility: Walking and Moving Around Current Status (Y6168) CI  Mobility: Walking and Moving Around Goal Status (H7290) CI    Mylo Red, PT, DPT [phone]

## 2015-09-28 DIAGNOSIS — T148 Other injury of unspecified body region: Secondary | ICD-10-CM | POA: Diagnosis not present

## 2015-09-28 DIAGNOSIS — E876 Hypokalemia: Secondary | ICD-10-CM | POA: Diagnosis not present

## 2015-09-28 DIAGNOSIS — G459 Transient cerebral ischemic attack, unspecified: Secondary | ICD-10-CM | POA: Diagnosis not present

## 2015-10-02 DIAGNOSIS — M5440 Lumbago with sciatica, unspecified side: Secondary | ICD-10-CM | POA: Diagnosis not present

## 2015-10-10 ENCOUNTER — Telehealth: Payer: Self-pay | Admitting: Cardiology

## 2015-10-10 ENCOUNTER — Encounter: Payer: Self-pay | Admitting: Internal Medicine

## 2015-10-10 ENCOUNTER — Ambulatory Visit (INDEPENDENT_AMBULATORY_CARE_PROVIDER_SITE_OTHER): Payer: PPO | Admitting: *Deleted

## 2015-10-10 DIAGNOSIS — Z95810 Presence of automatic (implantable) cardiac defibrillator: Secondary | ICD-10-CM

## 2015-10-10 LAB — CUP PACEART INCLINIC DEVICE CHECK
Battery Voltage: 3 V
Brady Statistic AP VP Percent: 11.5 %
Brady Statistic AS VS Percent: 3.75 %
Brady Statistic RA Percent Paced: 11.53 %
Date Time Interrogation Session: 20170823145236
HighPow Impedance: 58 Ohm
HighPow Impedance: 74 Ohm
HighPow Impedance: 931 Ohm
Implantable Lead Location: 753858
Implantable Lead Location: 753859
Implantable Lead Location: 753860
Implantable Lead Model: 4194
Implantable Lead Model: 5076
Lead Channel Pacing Threshold Amplitude: 0.625 V
Lead Channel Pacing Threshold Amplitude: 1.25 V
Lead Channel Pacing Threshold Pulse Width: 0.4 ms
Lead Channel Pacing Threshold Pulse Width: 0.4 ms
Lead Channel Sensing Intrinsic Amplitude: 5 mV
Lead Channel Sensing Intrinsic Amplitude: 5 mV
Lead Channel Sensing Intrinsic Amplitude: 8 mV
Lead Channel Setting Pacing Amplitude: 2 V
Lead Channel Setting Pacing Pulse Width: 0.4 ms
Lead Channel Setting Sensing Sensitivity: 0.3 mV
MDC IDC LEAD IMPLANT DT: 20080710
MDC IDC LEAD IMPLANT DT: 20080710
MDC IDC LEAD IMPLANT DT: 20080710
MDC IDC MSMT LEADCHNL LV IMPEDANCE VALUE: 285 Ohm
MDC IDC MSMT LEADCHNL LV IMPEDANCE VALUE: 513 Ohm
MDC IDC MSMT LEADCHNL LV IMPEDANCE VALUE: 608 Ohm
MDC IDC MSMT LEADCHNL RA IMPEDANCE VALUE: 589 Ohm
MDC IDC MSMT LEADCHNL RV IMPEDANCE VALUE: 1026 Ohm
MDC IDC MSMT LEADCHNL RV PACING THRESHOLD AMPLITUDE: 0.5 V
MDC IDC MSMT LEADCHNL RV PACING THRESHOLD PULSEWIDTH: 0.4 ms
MDC IDC MSMT LEADCHNL RV SENSING INTR AMPL: 8 mV
MDC IDC SET LEADCHNL LV PACING AMPLITUDE: 2.25 V
MDC IDC SET LEADCHNL RV PACING AMPLITUDE: 2.5 V
MDC IDC SET LEADCHNL RV PACING PULSEWIDTH: 0.4 ms
MDC IDC STAT BRADY AP VS PERCENT: 0.03 %
MDC IDC STAT BRADY AS VP PERCENT: 84.72 %
MDC IDC STAT BRADY RV PERCENT PACED: 96.22 %

## 2015-10-10 NOTE — Telephone Encounter (Signed)
Pt called and stated that he tried to get his transmission to go through but couldn't. He called [employer] tech services and they informed him that he needed a new power cord. Pt agreed to appt this afternoon at 2:30 PM/

## 2015-10-10 NOTE — Progress Notes (Signed)
Alert tone heard this morning, unable to send [facility] transmission. [facility] to expedite new monitor to patient. RV lead impedance out of range- reading of 1,026ohms. Trend is stable- increased upper limit to 1,500ohms per GT. Continue [facility] monitoring as scheduled, ROV with GT in June 2018.

## 2015-10-10 NOTE — Telephone Encounter (Signed)
Pt called in and stated that he heard alert one from his ICD instructed pt to send a remote transmission using his home monitor. Pt verbalized understanding.

## 2015-10-12 ENCOUNTER — Inpatient Hospital Stay (HOSPITAL_COMMUNITY)
Admission: EM | Admit: 2015-10-12 | Discharge: 2015-10-15 | DRG: 682 | Disposition: A | Discharge status: Home / Self Care | Payer: PPO | Attending: Internal Medicine | Admitting: Internal Medicine

## 2015-10-12 ENCOUNTER — Inpatient Hospital Stay (HOSPITAL_COMMUNITY): Payer: PPO

## 2015-10-12 ENCOUNTER — Encounter (HOSPITAL_COMMUNITY): Payer: Self-pay | Admitting: Emergency Medicine

## 2015-10-12 DIAGNOSIS — E039 Hypothyroidism, unspecified: Secondary | ICD-10-CM | POA: Diagnosis present

## 2015-10-12 DIAGNOSIS — M109 Gout, unspecified: Secondary | ICD-10-CM | POA: Diagnosis present

## 2015-10-12 DIAGNOSIS — Z7902 Long term (current) use of antithrombotics/antiplatelets: Secondary | ICD-10-CM | POA: Diagnosis not present

## 2015-10-12 DIAGNOSIS — D6959 Other secondary thrombocytopenia: Secondary | ICD-10-CM | POA: Diagnosis present

## 2015-10-12 DIAGNOSIS — R197 Diarrhea, unspecified: Secondary | ICD-10-CM | POA: Diagnosis not present

## 2015-10-12 DIAGNOSIS — D649 Anemia, unspecified: Secondary | ICD-10-CM | POA: Diagnosis present

## 2015-10-12 DIAGNOSIS — I5042 Chronic combined systolic (congestive) and diastolic (congestive) heart failure: Secondary | ICD-10-CM | POA: Diagnosis not present

## 2015-10-12 DIAGNOSIS — F1010 Alcohol abuse, uncomplicated: Secondary | ICD-10-CM | POA: Diagnosis not present

## 2015-10-12 DIAGNOSIS — R591 Generalized enlarged lymph nodes: Secondary | ICD-10-CM | POA: Diagnosis present

## 2015-10-12 DIAGNOSIS — I472 Ventricular tachycardia, unspecified: Secondary | ICD-10-CM

## 2015-10-12 DIAGNOSIS — A419 Sepsis, unspecified organism: Secondary | ICD-10-CM | POA: Insufficient documentation

## 2015-10-12 DIAGNOSIS — I447 Left bundle-branch block, unspecified: Secondary | ICD-10-CM | POA: Diagnosis present

## 2015-10-12 DIAGNOSIS — F17200 Nicotine dependence, unspecified, uncomplicated: Secondary | ICD-10-CM | POA: Diagnosis present

## 2015-10-12 DIAGNOSIS — E785 Hyperlipidemia, unspecified: Secondary | ICD-10-CM | POA: Diagnosis present

## 2015-10-12 DIAGNOSIS — W19XXXA Unspecified fall, initial encounter: Secondary | ICD-10-CM | POA: Diagnosis present

## 2015-10-12 DIAGNOSIS — N189 Chronic kidney disease, unspecified: Secondary | ICD-10-CM | POA: Diagnosis present

## 2015-10-12 DIAGNOSIS — I6389 Other cerebral infarction: Secondary | ICD-10-CM

## 2015-10-12 DIAGNOSIS — Z7982 Long term (current) use of aspirin: Secondary | ICD-10-CM | POA: Diagnosis not present

## 2015-10-12 DIAGNOSIS — R571 Hypovolemic shock: Secondary | ICD-10-CM | POA: Diagnosis present

## 2015-10-12 DIAGNOSIS — Z8673 Personal history of transient ischemic attack (TIA), and cerebral infarction without residual deficits: Secondary | ICD-10-CM

## 2015-10-12 DIAGNOSIS — M898X1 Other specified disorders of bone, shoulder: Secondary | ICD-10-CM

## 2015-10-12 DIAGNOSIS — K52831 Collagenous colitis: Secondary | ICD-10-CM | POA: Diagnosis not present

## 2015-10-12 DIAGNOSIS — Z95810 Presence of automatic (implantable) cardiac defibrillator: Secondary | ICD-10-CM | POA: Diagnosis not present

## 2015-10-12 DIAGNOSIS — I130 Hypertensive heart and chronic kidney disease with heart failure and stage 1 through stage 4 chronic kidney disease, or unspecified chronic kidney disease: Secondary | ICD-10-CM | POA: Diagnosis present

## 2015-10-12 DIAGNOSIS — E876 Hypokalemia: Secondary | ICD-10-CM | POA: Diagnosis present

## 2015-10-12 DIAGNOSIS — K529 Noninfective gastroenteritis and colitis, unspecified: Secondary | ICD-10-CM | POA: Diagnosis not present

## 2015-10-12 DIAGNOSIS — S4991XA Unspecified injury of right shoulder and upper arm, initial encounter: Secondary | ICD-10-CM | POA: Diagnosis not present

## 2015-10-12 DIAGNOSIS — E860 Dehydration: Secondary | ICD-10-CM | POA: Diagnosis present

## 2015-10-12 DIAGNOSIS — R531 Weakness: Secondary | ICD-10-CM | POA: Diagnosis not present

## 2015-10-12 DIAGNOSIS — N179 Acute kidney failure, unspecified: Secondary | ICD-10-CM | POA: Diagnosis present

## 2015-10-12 DIAGNOSIS — I2510 Atherosclerotic heart disease of native coronary artery without angina pectoris: Secondary | ICD-10-CM | POA: Diagnosis not present

## 2015-10-12 DIAGNOSIS — I255 Ischemic cardiomyopathy: Secondary | ICD-10-CM | POA: Diagnosis not present

## 2015-10-12 DIAGNOSIS — N170 Acute kidney failure with tubular necrosis: Secondary | ICD-10-CM | POA: Diagnosis not present

## 2015-10-12 DIAGNOSIS — M25519 Pain in unspecified shoulder: Secondary | ICD-10-CM | POA: Diagnosis not present

## 2015-10-12 DIAGNOSIS — A09 Infectious gastroenteritis and colitis, unspecified: Secondary | ICD-10-CM | POA: Diagnosis not present

## 2015-10-12 DIAGNOSIS — D696 Thrombocytopenia, unspecified: Secondary | ICD-10-CM

## 2015-10-12 DIAGNOSIS — M25511 Pain in right shoulder: Secondary | ICD-10-CM | POA: Diagnosis not present

## 2015-10-12 DIAGNOSIS — E890 Postprocedural hypothyroidism: Secondary | ICD-10-CM | POA: Diagnosis present

## 2015-10-12 DIAGNOSIS — R1033 Periumbilical pain: Secondary | ICD-10-CM | POA: Diagnosis not present

## 2015-10-12 LAB — I-STAT TROPONIN, ED: TROPONIN I, POC: 0.01 ng/mL (ref 0.00–0.08)

## 2015-10-12 LAB — I-STAT CG4 LACTIC ACID, ED: Lactic Acid, Venous: 1.35 mmol/L (ref 0.5–1.9)

## 2015-10-12 LAB — URINALYSIS, ROUTINE W REFLEX MICROSCOPIC
BILIRUBIN URINE: NEGATIVE
GLUCOSE, UA: NEGATIVE mg/dL
HGB URINE DIPSTICK: NEGATIVE
KETONES UR: NEGATIVE mg/dL
Leukocytes, UA: NEGATIVE
NITRITE: NEGATIVE
PH: 5.5 (ref 5.0–8.0)
Protein, ur: NEGATIVE mg/dL
SPECIFIC GRAVITY, URINE: 1.017 (ref 1.005–1.030)

## 2015-10-12 LAB — BASIC METABOLIC PANEL
Anion gap: 10 (ref 5–15)
BUN: 27 mg/dL — ABNORMAL HIGH (ref 6–20)
CALCIUM: 7.1 mg/dL — AB (ref 8.9–10.3)
CHLORIDE: 112 mmol/L — AB (ref 101–111)
CO2: 17 mmol/L — ABNORMAL LOW (ref 22–32)
CREATININE: 4.75 mg/dL — AB (ref 0.61–1.24)
GFR, EST AFRICAN AMERICAN: 13 mL/min — AB (ref >60)
GFR, EST NON AFRICAN AMERICAN: 11 mL/min — AB (ref >60)
Glucose, Bld: 100 mg/dL — ABNORMAL HIGH (ref 65–99)
Potassium: 3.1 mmol/L — ABNORMAL LOW (ref 3.5–5.1)
SODIUM: 139 mmol/L (ref 135–145)

## 2015-10-12 LAB — LIPASE, BLOOD: Lipase: 22 U/L (ref 11–51)

## 2015-10-12 LAB — PROTIME-INR
INR: 1.09
PROTHROMBIN TIME: 14.1 s (ref 11.4–15.2)

## 2015-10-12 LAB — GASTROINTESTINAL PANEL BY PCR, STOOL (REPLACES STOOL CULTURE)
ASTROVIRUS: NOT DETECTED
Adenovirus F40/41: NOT DETECTED
CAMPYLOBACTER SPECIES: NOT DETECTED
Cryptosporidium: NOT DETECTED
Cyclospora cayetanensis: NOT DETECTED
E. COLI O157: NOT DETECTED
ENTAMOEBA HISTOLYTICA: NOT DETECTED
ENTEROAGGREGATIVE E COLI (EAEC): NOT DETECTED
ENTEROTOXIGENIC E COLI (ETEC): NOT DETECTED
Enteropathogenic E coli (EPEC): NOT DETECTED
GIARDIA LAMBLIA: NOT DETECTED
NOROVIRUS GI/GII: NOT DETECTED
PLESIMONAS SHIGELLOIDES: NOT DETECTED
Rotavirus A: NOT DETECTED
SALMONELLA SPECIES: NOT DETECTED
SHIGELLA/ENTEROINVASIVE E COLI (EIEC): NOT DETECTED
Sapovirus (I, II, IV, and V): NOT DETECTED
Shiga like toxin producing E coli (STEC): NOT DETECTED
VIBRIO CHOLERAE: NOT DETECTED
Vibrio species: NOT DETECTED
Yersinia enterocolitica: NOT DETECTED

## 2015-10-12 LAB — CBC WITH DIFFERENTIAL/PLATELET
BASOS PCT: 0 %
Basophils Absolute: 0 10*3/uL (ref 0.0–0.1)
EOS ABS: 0.1 10*3/uL (ref 0.0–0.7)
EOS PCT: 1 %
HEMATOCRIT: 43.3 % (ref 39.0–52.0)
Hemoglobin: 14.6 g/dL (ref 13.0–17.0)
Lymphocytes Relative: 15 %
Lymphs Abs: 2 10*3/uL (ref 0.7–4.0)
MCH: 31.3 pg (ref 26.0–34.0)
MCHC: 33.7 g/dL (ref 30.0–36.0)
MCV: 92.9 fL (ref 78.0–100.0)
MONO ABS: 1.2 10*3/uL — AB (ref 0.1–1.0)
MONOS PCT: 9 %
NEUTROS ABS: 10.6 10*3/uL — AB (ref 1.7–7.7)
Neutrophils Relative %: 75 %
Platelets: 198 10*3/uL (ref 150–400)
RBC: 4.66 MIL/uL (ref 4.22–5.81)
RDW: 13.9 % (ref 11.5–15.5)
WBC: 14 10*3/uL — ABNORMAL HIGH (ref 4.0–10.5)

## 2015-10-12 LAB — COMPREHENSIVE METABOLIC PANEL
ALBUMIN: 3.3 g/dL — AB (ref 3.5–5.0)
ALT: 21 U/L (ref 17–63)
ANION GAP: 17 — AB (ref 5–15)
AST: 28 U/L (ref 15–41)
Alkaline Phosphatase: 60 U/L (ref 38–126)
BILIRUBIN TOTAL: 0.6 mg/dL (ref 0.3–1.2)
BUN: 31 mg/dL — ABNORMAL HIGH (ref 6–20)
CO2: 22 mmol/L (ref 22–32)
Calcium: 8.7 mg/dL — ABNORMAL LOW (ref 8.9–10.3)
Chloride: 98 mmol/L — ABNORMAL LOW (ref 101–111)
Creatinine, Ser: 6.39 mg/dL — ABNORMAL HIGH (ref 0.61–1.24)
GFR calc Af Amer: 9 mL/min — ABNORMAL LOW (ref >60)
GFR calc non Af Amer: 8 mL/min — ABNORMAL LOW (ref >60)
GLUCOSE: 115 mg/dL — AB (ref 65–99)
POTASSIUM: 2.8 mmol/L — AB (ref 3.5–5.1)
Sodium: 137 mmol/L (ref 135–145)
TOTAL PROTEIN: 6.8 g/dL (ref 6.5–8.1)

## 2015-10-12 LAB — LACTIC ACID, PLASMA
LACTIC ACID, VENOUS: 1.7 mmol/L (ref 0.5–1.9)
Lactic Acid, Venous: 1.3 mmol/L (ref 0.5–1.9)

## 2015-10-12 LAB — APTT: APTT: 27 s (ref 24–36)

## 2015-10-12 LAB — MRSA PCR SCREENING: MRSA by PCR: NEGATIVE

## 2015-10-12 LAB — TSH
TSH: 0.112 u[IU]/mL — ABNORMAL LOW (ref 0.350–4.500)
TSH: 0.137 u[IU]/mL — AB (ref 0.350–4.500)

## 2015-10-12 LAB — TROPONIN I: Troponin I: <0.03 ng/mL (ref <0.03)

## 2015-10-12 LAB — PROCALCITONIN: Procalcitonin: 0.17 ng/mL

## 2015-10-12 LAB — MAGNESIUM: Magnesium: 1.8 mg/dL (ref 1.7–2.4)

## 2015-10-12 MED ORDER — POLYVINYL ALCOHOL 1.4 % OP SOLN
1.0000 [drp] | OPHTHALMIC | Status: DC | PRN
  Administered 2015-10-12: 1 [drp] via OPHTHALMIC
  Filled 2015-10-12: qty 15

## 2015-10-12 MED ORDER — ATORVASTATIN CALCIUM 40 MG PO TABS
40.0000 mg | ORAL_TABLET | Freq: Every day | ORAL | Status: DC
  Administered 2015-10-12 – 2015-10-15 (×4): 40 mg via ORAL
  Filled 2015-10-12 (×4): qty 1

## 2015-10-12 MED ORDER — SODIUM CHLORIDE 0.9% FLUSH
3.0000 mL | Freq: Two times a day (BID) | INTRAVENOUS | Status: DC
  Administered 2015-10-13 – 2015-10-15 (×4): 3 mL via INTRAVENOUS

## 2015-10-12 MED ORDER — ONDANSETRON HCL 4 MG/2ML IJ SOLN: 4.0000 mg | Freq: Four times a day (QID) | INTRAMUSCULAR | Status: DC | PRN

## 2015-10-12 MED ORDER — POTASSIUM CHLORIDE IN NACL 40-0.9 MEQ/L-% IV SOLN
INTRAVENOUS | Status: DC
  Administered 2015-10-12: 75 mL/h via INTRAVENOUS
  Administered 2015-10-13 – 2015-10-15 (×4): 100 mL/h via INTRAVENOUS
  Filled 2015-10-12 (×7): qty 1000

## 2015-10-12 MED ORDER — ALLOPURINOL 100 MG PO TABS
100.0000 mg | ORAL_TABLET | ORAL | Status: DC
  Administered 2015-10-13 – 2015-10-15 (×2): 100 mg via ORAL
  Filled 2015-10-12 (×2): qty 1

## 2015-10-12 MED ORDER — SODIUM CHLORIDE 0.9 % IV BOLUS (SEPSIS)
2000.0000 mL | Freq: Once | INTRAVENOUS | Status: AC
  Administered 2015-10-12: 2000 mL via INTRAVENOUS

## 2015-10-12 MED ORDER — CIPROFLOXACIN IN D5W 400 MG/200ML IV SOLN
400.0000 mg | Freq: Once | INTRAVENOUS | Status: AC
  Administered 2015-10-12: 400 mg via INTRAVENOUS
  Filled 2015-10-12: qty 200

## 2015-10-12 MED ORDER — METRONIDAZOLE IN NACL 5-0.79 MG/ML-% IV SOLN
500.0000 mg | Freq: Four times a day (QID) | INTRAVENOUS | Status: DC
  Administered 2015-10-12 – 2015-10-13 (×5): 500 mg via INTRAVENOUS
  Filled 2015-10-12 (×7): qty 100

## 2015-10-12 MED ORDER — LEVOTHYROXINE SODIUM 100 MCG PO TABS
100.0000 ug | ORAL_TABLET | Freq: Every day | ORAL | Status: DC
  Administered 2015-10-12 – 2015-10-13 (×2): 100 ug via ORAL
  Filled 2015-10-12 (×3): qty 1

## 2015-10-12 MED ORDER — HYDROCODONE-ACETAMINOPHEN 5-325 MG PO TABS
1.0000 | ORAL_TABLET | Freq: Four times a day (QID) | ORAL | Status: DC | PRN
  Administered 2015-10-12 – 2015-10-15 (×6): 1 via ORAL
  Filled 2015-10-12 (×6): qty 1

## 2015-10-12 MED ORDER — ZOLPIDEM TARTRATE 5 MG PO TABS
5.0000 mg | ORAL_TABLET | Freq: Once | ORAL | Status: AC
  Administered 2015-10-12: 5 mg via ORAL
  Filled 2015-10-12: qty 1

## 2015-10-12 MED ORDER — HEPARIN SODIUM (PORCINE) 5000 UNIT/ML IJ SOLN
5000.0000 [IU] | Freq: Three times a day (TID) | INTRAMUSCULAR | Status: DC
  Administered 2015-10-12 – 2015-10-14 (×7): 5000 [IU] via SUBCUTANEOUS
  Filled 2015-10-12 (×7): qty 1

## 2015-10-12 MED ORDER — ASPIRIN EC 81 MG PO TBEC
81.0000 mg | DELAYED_RELEASE_TABLET | Freq: Every day | ORAL | Status: DC
  Administered 2015-10-12 – 2015-10-15 (×4): 81 mg via ORAL
  Filled 2015-10-12 (×4): qty 1

## 2015-10-12 MED ORDER — SODIUM CHLORIDE 0.9 % IV BOLUS (SEPSIS)
1000.0000 mL | Freq: Once | INTRAVENOUS | Status: AC
Start: 2015-10-12 — End: 2015-10-12
  Administered 2015-10-12: 1000 mL via INTRAVENOUS

## 2015-10-12 MED ORDER — CLOPIDOGREL BISULFATE 75 MG PO TABS
75.0000 mg | ORAL_TABLET | Freq: Every day | ORAL | Status: DC
  Administered 2015-10-12 – 2015-10-15 (×4): 75 mg via ORAL
  Filled 2015-10-12 (×4): qty 1

## 2015-10-12 MED ORDER — CIPROFLOXACIN IN D5W 400 MG/200ML IV SOLN
400.0000 mg | INTRAVENOUS | Status: DC
  Administered 2015-10-13: 400 mg via INTRAVENOUS
  Filled 2015-10-12: qty 200

## 2015-10-12 MED ORDER — CARVEDILOL 6.25 MG PO TABS
6.2500 mg | ORAL_TABLET | Freq: Two times a day (BID) | ORAL | Status: DC
  Administered 2015-10-13 (×2): 6.25 mg via ORAL
  Filled 2015-10-12 (×2): qty 1

## 2015-10-12 MED ORDER — SODIUM CHLORIDE 0.9 % IV BOLUS (SEPSIS)
1000.0000 mL | Freq: Once | INTRAVENOUS | Status: AC
  Administered 2015-10-12: 1000 mL via INTRAVENOUS

## 2015-10-12 MED ORDER — NITROGLYCERIN 0.4 MG SL SUBL: 0.4000 mg | SUBLINGUAL_TABLET | SUBLINGUAL | Status: DC | PRN

## 2015-10-12 MED ORDER — POTASSIUM CHLORIDE 10 MEQ/100ML IV SOLN: 10.0000 meq | INTRAVENOUS | Status: DC

## 2015-10-12 MED ORDER — ACETAMINOPHEN 650 MG RE SUPP: 650.0000 mg | Freq: Four times a day (QID) | RECTAL | Status: DC | PRN

## 2015-10-12 MED ORDER — ONDANSETRON HCL 4 MG PO TABS
4.0000 mg | ORAL_TABLET | Freq: Four times a day (QID) | ORAL | Status: DC | PRN
Start: 2015-10-12 — End: 2015-10-15

## 2015-10-12 MED ORDER — TRAZODONE HCL 50 MG PO TABS
25.0000 mg | ORAL_TABLET | Freq: Every evening | ORAL | Status: DC | PRN
  Administered 2015-10-13: 25 mg via ORAL
  Filled 2015-10-12: qty 1

## 2015-10-12 MED ORDER — POTASSIUM CHLORIDE CRYS ER 20 MEQ PO TBCR
60.0000 meq | EXTENDED_RELEASE_TABLET | Freq: Once | ORAL | Status: AC
  Administered 2015-10-12: 60 meq via ORAL
  Filled 2015-10-12: qty 3

## 2015-10-12 MED ORDER — ACETAMINOPHEN 325 MG PO TABS: 650.0000 mg | ORAL_TABLET | Freq: Four times a day (QID) | ORAL | Status: DC | PRN

## 2015-10-12 MED ORDER — MAGNESIUM SULFATE 2 GM/50ML IV SOLN
2.0000 g | Freq: Once | INTRAVENOUS | Status: AC
  Administered 2015-10-12: 2 g via INTRAVENOUS
  Filled 2015-10-12: qty 50

## 2015-10-12 NOTE — ED Triage Notes (Signed)
Per pt, c/o diarrhea x 2 months. States he feels weak and has not been able to eat.

## 2015-10-12 NOTE — ED Provider Notes (Signed)
[facility]-EMER[facility]ENCY DEPT Provider Note   CSN: [id number] Arrival date & time: 10/12/15  [id number]     History   Chief Complaint Chief Complaint  Patient presents with  . Weakness    HPI Don [name] is a 70 y.o. male with past medical history of alcohol abuse, CHF, chronic kidney disease, ischemic myopathy status post AICD placement presenting today with diarrhea. He states this is been going on for the past 2 weeks. He denies any abdominal pain. He does have nausea but no vomiting. He states his stool has been liquid. He has not had any fevers. He presents today because he is having profound weakness. There are no further complaints.  10 Systems reviewed and are negative for acute change except as noted in the HPI.    HPI  Past Medical History:  Diagnosis Date  . Alcohol abuse    PREVIOUS H/O DRINKING A FIFTH OF VODKA  A DAY  . Anginal pain (HCC)   . Arthritis    hands & back  . CHF (congestive heart failure) (HCC)   . Chronic kidney disease    acute renal  04/2012  . Chronic systolic heart failure (HCC)    LEFT  . Coronary artery disease   . History of angina   . Hypertension   . ICD (implantable cardiac defibrillator) in place   . LBBB (left bundle branch block)   . Nonischemic cardiomyopathy (HCC)    EF 20% s/p INSERTION OF BI-V DEFIBRILLATOR.  . Stroke (HCC)    TIA's  . Thyroid disease    HYPOTHYROIDISM.....TREATED  . TIA (transient ischemic attack)     Patient Active Problem List   Diagnosis Date Noted  . Dizziness and giddiness   . History of stroke 09/18/2015  . HLD (hyperlipidemia) 09/18/2015  . Hypothyroid 09/18/2015  . Essential hypertension 09/18/2015  . Tobacco use 09/18/2015  . 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 Hypocalcemia 02/13/2014  . Lymphadenopathy 09/28/2013  . Transient cerebral ischemia 08/16/2012  . Hemorrhoids with complication 04/29/2012  . Acute renal failure (HCC) 04/26/2012  . Hypovolemic shock (HCC) 04/26/2012  . Rectal  bleeding 04/26/2012  . Diarrhea 04/26/2012  . Hypokalemia 04/26/2012  . Right rib fracture 04/26/2012  . Fall 04/26/2012  . Dehydration 04/26/2012  . ETOH abuse   . Stroke (HCC)   . 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   . Biventricular implantable cardioverter-defibrillator in situ 07/10/2010  . Chronic combined systolic (congestive) and diastolic (congestive) heart failure (HCC) 07/10/2010  . Ventricular tachycardia (HCC) 07/10/2010    Past Surgical History:  Procedure Laterality Date  . CARDIAC CATHETERIZATION    . CYSTOSCOPY W/ STONE MANIPULATION    . FLEXIBLE SIGMOIDOSCOPY N/A 04/29/2012   Procedure: FLEXIBLE SIGMOIDOSCOPY;  Surgeon: Vertell NovakJames L Edwards Jr., MD;  [facility];  Service: Endoscopy;  Laterality: N/A;  . IMPLANTABLE CARDIOVERTER DEFIBRILLATOR (ICD) GENERATOR CHANGE Bilateral 12/25/2011   Procedure: ICD GENERATOR CHANGE;  Surgeon: Marinus MawGregg W Taylor, MD;  [facility];  Service: Cardiovascular;  Laterality: Bilateral;  . INSERT / REPLACE / REMOVE PACEMAKER     BI-V IMPLANTABLE CARDIOVERTER-DEFIBRILLATOR. ; [employer] CONCERTO; MODEL  # [id number], SERIAL # [id number] H.  DR. Humberto LeepJOHN H. EDMUNDS.  . TONSILLECTOMY    . VASECTOMY         Home Medications    Prior to Admission medications   Medication Sig Start Date End Date Taking? Authorizing Provider  allopurinol (ZYLOPRIM) 100 MG tablet Take 100 mg by mouth daily.  Yes Historical Provider, MD  aspirin 81 MG tablet Take 1 tablet (81 mg total) by mouth daily. 09/20/15  Yes Mauricio Annett Gula, MD  atorvastatin (LIPITOR) 40 MG tablet Take 40 mg by mouth daily.  09/03/11  Yes Historical Provider, MD  benazepril (LOTENSIN) 10 MG tablet Take 10 mg by mouth daily.   Yes Historical Provider, MD  calcium carbonate (TUMS - DOSED IN MG ELEMENTAL CALCIUM) 500 MG chewable tablet Chew 1 tablet by mouth daily.   Yes Historical Provider, MD  carvedilol (COREG) 6.25 MG tablet Take 6.25 mg by mouth 2  (two) times daily with a meal.   Yes Historical Provider, MD  Cholecalciferol 4000 UNITS CAPS Take 1 capsule by mouth daily.   Yes Historical Provider, MD  clopidogrel (PLAVIX) 75 MG tablet Take 1 tablet (75 mg total) by mouth daily. 09/20/15  Yes Mauricio Annett Gula, MD  furosemide (LASIX) 20 MG tablet Take 20 mg by mouth daily.   Yes Historical Provider, MD  levothyroxine (SYNTHROID, LEVOTHROID) 100 MCG tablet Take 100 mcg by mouth daily before breakfast.   Yes Historical Provider, MD  nitroGLYCERIN (NITROSTAT) 0.4 MG SL tablet Place 0.4 mg under the tongue every 5 (five) minutes as needed for chest pain.    Yes Historical Provider, MD  OVER THE COUNTER MEDICATION Take 1 tablet by mouth 2 (two) times daily. Multi-allergy tablet   Yes Historical Provider, MD  potassium chloride (K-DUR) 10 MEQ tablet Take 2 tablets (20 mEq total) by mouth daily. 09/20/15  Yes Mauricio Annett Gula, MD    Family History Family History  Problem Relation Age of Onset  . Heart disease Mother   . Heart disease Father   . Hypertension      Social History Social History  Substance Use Topics  . Smoking status: Current Every Day Smoker    Packs/day: 1.50    Years: 43.00    Last attempt to quit: 02/27/2012  . Smokeless tobacco: Never Used  . Alcohol use No     Comment: H/O ALCOHOLISM PERVIOUSLY A FIFTH OF VODKA A DAY     Allergies   Sulfa antibiotics   Review of Systems Review of Systems   Physical Exam Updated Vital Signs BP 100/68   Pulse 76   Temp 98.7 F (37.1 C) (Rectal)   Resp 20   Ht 6\' 1"  (1.854 m)   Wt 220 lb (99.8 kg)   SpO2 100%   BMI 29.03 kg/m   Physical Exam  Constitutional: He is oriented to person, place, and time. Vital signs are normal. He appears well-developed and well-nourished.  Non-toxic appearance. He does not appear ill. No distress.  Patient appears pale.  HENT:  Head: Normocephalic and atraumatic.  Nose: Nose normal.  Mouth/Throat: Oropharynx is clear and  moist. No oropharyngeal exudate.  Eyes: Conjunctivae and EOM are normal. Pupils are equal, round, and reactive to light. No scleral icterus.  Neck: Normal range of motion. Neck supple. No tracheal deviation, no edema, no erythema and normal range of motion present. No thyroid mass and no thyromegaly present.  Cardiovascular: Normal rate, regular rhythm, S1 normal, S2 normal, normal heart sounds, intact distal pulses and normal pulses.  Exam reveals no gallop and no friction rub.   No murmur heard. Pulmonary/Chest: Effort normal and breath sounds normal. No respiratory distress. He has no wheezes. He has no rhonchi. He has no rales.  AICD in left chest wall.  Abdominal: Soft. Normal appearance and bowel sounds are normal. He exhibits  no distension, no ascites and no mass. There is no hepatosplenomegaly. There is no tenderness. There is no rebound, no guarding and no CVA tenderness.  Musculoskeletal: Normal range of motion. He exhibits no edema or tenderness.  Lymphadenopathy:    He has no cervical adenopathy.  Neurological: He is alert and oriented to person, place, and time. He has normal strength. No cranial nerve deficit or sensory deficit.  Skin: Skin is warm, dry and intact. No petechiae and no rash noted. He is not diaphoretic. No erythema. No pallor.  Nursing note and vitals reviewed.    ED Treatments / Results  Labs (all labs ordered are listed, but only abnormal results are displayed) Labs Reviewed  CBC WITH DIFFERENTIAL/PLATELET - Abnormal; Notable for the following:       Result Value   WBC 14.0 (*)    Neutro Abs 10.6 (*)    Monocytes Absolute 1.2 (*)    All other components within normal limits  COMPREHENSIVE METABOLIC PANEL - Abnormal; Notable for the following:    Potassium 2.8 (*)    Chloride 98 (*)    Glucose, Bld 115 (*)    BUN 31 (*)    Creatinine, Ser 6.39 (*)    Calcium 8.7 (*)    Albumin 3.3 (*)    GFR calc non Af Amer 8 (*)    GFR calc Af Amer 9 (*)    Anion  gap 17 (*)    All other components within normal limits  GASTROINTESTINAL PANEL BY PCR, STOOL (REPLACES STOOL CULTURE)  LIPASE, BLOOD  MAGNESIUM  FECAL LACTOFERRIN, QUANT  ROTAVIRUS ANTIGEN, STOOL  I-STAT TROPOININ, ED    EKG  EKG Interpretation  Date/Time:  Friday October 12 2015 05:46:14 EDT Ventricular Rate:  87 PR Interval:    QRS Duration: 143 QT Interval:  448 QTC Calculation: 604 R Axis:   -64 Text Interpretation:  Normal sinus rhythm in a pattern of bigeminy VENTRICULAR PACING new since last tracing Confirmed by Erroll Luna [phone]) on 10/12/2015 6:14:24 AM       Radiology No results found.  Procedures Procedures (including critical care time)  Medications Ordered in ED Medications  ciprofloxacin (CIPRO) IVPB 400 mg (400 mg Intravenous New Bag/Given 10/12/15 0627)  potassium chloride SA (K-DUR,KLOR-CON) CR tablet 60 mEq (not administered)  magnesium sulfate IVPB 2 g 50 mL (not administered)  sodium chloride 0.9 % bolus 1,000 mL (0 mLs Intravenous Stopped 10/12/15 0629)  sodium chloride 0.9 % bolus 1,000 mL (1,000 mLs Intravenous New Bag/Given 10/12/15 0629)  sodium chloride 0.9 % bolus 2,000 mL (2,000 mLs Intravenous New Bag/Given 10/12/15 0636)     Initial Impression / Assessment and Plan / ED Course  I have reviewed the triage vital signs and the nursing notes.  Pertinent labs & imaging results that were available during my care of the patient were reviewed by me and considered in my medical decision making (see chart for details).  Clinical Course   Patient presents to the ED for persistent diarrhea.  His initial blood pressure 60 systolic. IVs were merely placing is given 2 L of IV fluids. Creatinine is 6.3 which is acute kidney injury for him. Potassium low at 2.8. He was given 60 mEq oral. Magnesium was replaced as well. His blood pressure continues to be in the mid to low 90s. He was ordered 2 more liters of IV fluids as well as ciprofloxacin for  treatment of possible bacterial infection causing his diarrhea. Stool studies were sent.  He'll require admission for further care.   CRITICAL CARE Performed by: Tomasita Crumble   Total critical care time: 40 minutes - sepsis, hypotension requiring 4L IVF  Critical care time was exclusive of separately billable procedures and treating other patients.  Critical care was necessary to treat or prevent imminent or life-threatening deterioration.  Critical care was time spent personally by me on the following activities: development of treatment plan with patient and/or surrogate as well as nursing, discussions with consultants, evaluation of patient's response to treatment, examination of patient, obtaining history from patient or surrogate, ordering and performing treatments and interventions, ordering and review of laboratory studies, ordering and review of radiographic studies, pulse oximetry and re-evaluation of patient's condition.   Final Clinical Impressions(s) / ED Diagnoses   Final diagnoses:  None    New Prescriptions New Prescriptions   No medications on file     Tomasita Crumble, MD 10/12/15 [phone]

## 2015-10-12 NOTE — H&P (Signed)
History and Physical    SIMEON DEBES [name]:[id number] DOB: Feb 13, 1946 DOA: 10/12/2015  PCP: [name], MD  Patient coming from:   Home   Chief Complaint:  Diarrhea, weakness  HPI: Don [name] is a 70 y.o. male with a medical history significant for, but not  limited to, nonischemic cardio myopathy, chronic systolic heart failure, left bundle branch block, biventricular ICD, and chronic kidney disease. Patient presents to the emergency department with severe weakness and watery, nonbloody diarrhea. No associated abdominal pain, he has had intermittent nausea without vomiting . He passed out 3 times at home yesterday with preceding dizziness. Vision has not been taking much by mouth the last 2 months, his diarrhea is not dependent on oral intake, he wakes up in the middle of the night with watery stools. No antibiotics or medication changes in the last few months except for addition of Plavix. Prior to 2 months ago patient's bowel movements were solid, once daily. No fevers at home but patient will continue wet from sweating. His appetite is poor.    ED Course:  Afebrile, hypotensive on arrival, improved with 2 L bolus. Additional 2 L bolus ordered by EDP Potassium 2.8, magnesium 1.8. Anion gap 17 Troponin POC 0.01 WBC 14 with left shift. Absolute neutrophil count 10. 6  Review of Systems: Bilateral scapular pain following fall, right greater than left  As per HPI, 10 point review of systems otherwise negative.   Past Medical History:  Diagnosis Date  . Alcohol abuse    PREVIOUS H/O DRINKING A FIFTH OF VODKA  A DAY  . Anginal pain (HCC)   . Arthritis    hands & back  . CHF (congestive heart failure) (HCC)   . Chronic kidney disease    acute renal  04/2012  . Chronic systolic heart failure (HCC)    LEFT  . Coronary artery disease   . History of angina   . Hypertension   . ICD (implantable cardiac defibrillator) in place   . LBBB (left bundle branch block)    . Nonischemic cardiomyopathy (HCC)    EF 20% s/p INSERTION OF BI-V DEFIBRILLATOR.  . Stroke (HCC)    TIA's  . Thyroid disease    HYPOTHYROIDISM.....TREATED  . TIA (transient ischemic attack)     Past Surgical History:  Procedure Laterality Date  . CARDIAC CATHETERIZATION    . CYSTOSCOPY W/ STONE MANIPULATION    . FLEXIBLE SIGMOIDOSCOPY N/A 04/29/2012   Procedure: FLEXIBLE SIGMOIDOSCOPY;  Surgeon: Vertell Novak., MD;  Location[facility] ENDOSCOPY;  Service: Endoscopy;  Laterality: N/A;  . IMPLANTABLE CARDIOVERTER DEFIBRILLATOR (ICD) GENERATOR CHANGE Bilateral 12/25/2011   Procedure: ICD GENERATOR CHANGE;  Surgeon: Marinus Maw, MD;  Location: [facility] CATH LAB;  Service: Cardiovascular;  Laterality: Bilateral;  . INSERT / REPLACE / REMOVE PACEMAKER     BI-V IMPLANTABLE CARDIOVERTER-DEFIBRILLATOR. ; [employer] CONCERTO; MODEL  # [id number], SERIAL # [id number] H.  DR. Humberto Leep. EDMUNDS.  . TONSILLECTOMY    . VASECTOMY      Social History   Social History  . Marital status: Widowed    Spouse name: N/A  . Number of children: N/A  . Years of education: N/A   Occupational History  . RETIRED     SOCIAL WORKER   Social History Main Topics  . Smoking status: Current Every Day Smoker    Packs/day: 1.50    Years: 43.00    Last attempt to quit: 02/27/2012  . Smokeless tobacco: Never Used  .  Alcohol use No     Comment: H/O ALCOHOLISM PERVIOUSLY A FIFTH OF VODKA A DAY  . Drug use: No  . Sexual activity: Not on file   Other Topics Concern  . Not on file   Social History Narrative   BI-V IMPLANTABLE CARDIOVERTER-DEFIBRILLATOR; [employer]TRON[employer] CONCERTO, MODEL # [id number]; SERIAL # [id number] H;       RETIRED SOCIAL WORKER      MARRIED 4 TIMES      H/O ALCOHOLISM; A FIFTH OF VODKA DAILY IN THE PAST           Lives at home. Daughter just graduated from college and currently living with patient. Patient has both cane and walker but usually does not require them for ambulation  Allergies  Allergen  Reactions  . Sulfa Antibiotics Nausea And Vomiting    Family History  Problem Relation Age of Onset  . Heart disease Mother   . Heart disease Father   . Hypertension      Prior to Admission medications   Medication Sig Start Date End Date Taking? Authorizing Provider  allopurinol (ZYLOPRIM) 100 MG tablet Take 100 mg by mouth daily.    Yes Historical Provider, MD  aspirin 81 MG tablet Take 1 tablet (81 mg total) by mouth daily. 09/20/15  Yes Mauricio Annett Gula, MD  atorvastatin (LIPITOR) 40 MG tablet Take 40 mg by mouth daily.  09/03/11  Yes Historical Provider, MD  benazepril (LOTENSIN) 10 MG tablet Take 10 mg by mouth daily.   Yes Historical Provider, MD  calcium carbonate (TUMS - DOSED IN MG ELEMENTAL CALCIUM) 500 MG chewable tablet Chew 1 tablet by mouth daily.   Yes Historical Provider, MD  carvedilol (COREG) 6.25 MG tablet Take 6.25 mg by mouth 2 (two) times daily with a meal.   Yes Historical Provider, MD  Cholecalciferol 4000 UNITS CAPS Take 1 capsule by mouth daily.   Yes Historical Provider, MD  clopidogrel (PLAVIX) 75 MG tablet Take 1 tablet (75 mg total) by mouth daily. 09/20/15  Yes Mauricio Annett Gula, MD  furosemide (LASIX) 20 MG tablet Take 20 mg by mouth daily.   Yes Historical Provider, MD  levothyroxine (SYNTHROID, LEVOTHROID) 100 MCG tablet Take 100 mcg by mouth daily before breakfast.   Yes Historical Provider, MD  nitroGLYCERIN (NITROSTAT) 0.4 MG SL tablet Place 0.4 mg under the tongue every 5 (five) minutes as needed for chest pain.    Yes Historical Provider, MD  OVER THE COUNTER MEDICATION Take 1 tablet by mouth 2 (two) times daily. Multi-allergy tablet   Yes Historical Provider, MD  potassium chloride (K-DUR) 10 MEQ tablet Take 2 tablets (20 mEq total) by mouth daily. 09/20/15  Yes Mauricio Annett Gula, MD    Physical Exam: Vitals:   10/12/15 0615 10/12/15 0630 10/12/15 0655 10/12/15 0715  BP: (!) 62/53 100/68 90/58 (!) 112/54  Pulse: 69 76 81   Resp: 17 20  26    Temp:      TempSrc:      SpO2: 100% 100% 100% 98%  Weight:      Height:        Constitutional:  Pleasant, white male in NAD, calm, comfortable Vitals:   10/12/15 0615 10/12/15 0630 10/12/15 0655 10/12/15 0715  BP: (!) 62/53 100/68 90/58 (!) 112/54  Pulse: 69 76 81   Resp: 17 20 26    Temp:      TempSrc:      SpO2: 100% 100% 100% 98%  Weight:      Height:  Eyes: PER, lids and conjunctivae normal ENMT: Mucous membranes are moist. Posterior pharynx clear of any exudate or lesions..  Neck: normal, supple, no masses Respiratory: clear to auscultation bilaterally, no wheezing, no crackles. Normal respiratory effort. No accessory muscle use.  Cardiovascular: Regular rate and rhythm, no murmurs / rubs / gallops. No extremity edema. 2+ dorsal pedis pulses.   Abdomen: no tenderness, no masses palpated. No hepatomegaly. Bowel sounds positive.  Musculoskeletal: no clubbing / cyanosis. No joint deformity upper and lower extremities. Good ROM, no contractures. Normal muscle tone.  Skin: no rashes, lesions, ulcers.  Neurologic: CN 2-12 grossly intact. Sensation intact, Strength 5/5 in all 4.  Psychiatric: Normal judgment and insight. Alert and oriented x 3. Normal mood.    Labs on Admission: I have personally reviewed following labs and imaging studies  Radiological Exams on Admission: No results found.  EKG: Independently reviewed.   EKG Interpretation  Date/Time:  Friday October 12 2015 05:46:14 EDT Ventricular Rate:  87 PR Interval:    QRS Duration: 143 QT Interval:  448 QTC Calculation: 604 R Axis:   -64 Text Interpretation:  Normal sinus rhythm in a pattern of bigeminy VENTRICULAR PACING new since last tracing Confirmed by Erroll Lunani, Adeleke Ayokunle [phone]([id number]) on 10/12/2015 6:14:24 AM       Assessment/Plan   Active Problems:   Biventricular implantable cardioverter-defibrillator in situ   Chronic combined systolic (congestive) and diastolic (congestive) heart failure  (HCC)   Acute renal failure (HCC)   Hypovolemic shock (HCC)   Diarrhea   Fall   Dehydration   History of stroke   HLD (hyperlipidemia)   Sepsis (HCC)   Pain in scapula    Chronic subacute diarrhea (2 months) / SIRS, possible sepsis with associated severe dehydration, AKI and weight loss.  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  -admit to stepdown telemetry -Sepsis order set utilized -Sepsis likely GI source but obtain CXR, u/a. Obtain non-contrast CTscan of abd/pelvis -Cdiff, rotvirus, fecal WBCs ordered by EDP. Will add complete stool pathogen panel and given given chronicity of diarrhea need to add 0+P as well.             -continue cipro (pharmacy consult) and add flagyl pending stool study results -Fluid repletion in progress, received 4 liter bolus in ED. NS with 40mEq of KCL at 75ml /hr going now. When K+ normalizes will need to d/c K+ containing IV fluids -consult [facility] - ? Recurrent collagenous colitis -If c-diff returns negative then start anti-diarrheals  Right scapular pain post falls at home yesterday. Describes 3 syncopal episodes  yesterday (preceded by dizziness). -xray right scapula -volume repletion.  -If remains weak after volume repletion may need PT evaluation  Hypokalemia, secondary to diarrhea -received 60mEq K+ in ED. Now getting  -recheck BMET this afternoon -Mg+ low normal, given 2gms IV in ED  AKI. Baseline Cr 0.66, up to 6.39 today likely combination of volume depletion and NSAID use -check u/a -IV fluid repletion -hold home ACEI, avoid nephrotoxic medications.  -recheck BMET this afternoon  Hx of CVA. Started on Plavix in early August.  . -continue home plavix  Hypothyroidism   . -check TSH -continue home synthroid  Ischemic cardiomyopathy / chronic combined systolic and diastolic heart failure /  BiVentricular defibrillator in place -continue home coreg -daily wts -I+0  Hyperlipidemia -continue home statin  Gout.    -resume home allopurinol 100mg  but decrease to QOD for now given AKI  DVT prophylaxis:    SQ Heparin  Code Status:     Full code   Family Communication:    Treatment plan discussed with daughter in room and she understands and agrees with the plan..  Disposition Plan:   Discharge in 3-4 days             Consults called:  [facility] Admission status:    Admission -  stepdown   Willette Cluster NP [facility] Pager [phone]  If 7PM-7AM, please contact night-coverage [email or web address] Password [email or web address]  10/12/2015, 7:22 AM

## 2015-10-12 NOTE — ED Notes (Signed)
Pt c/o chest pressure and back pain. Dr. Mora Bellman aware. I-stat troponin ordered.

## 2015-10-12 NOTE — ED Notes (Signed)
Meal tray ordered for patient.

## 2015-10-12 NOTE — Consult Note (Signed)
[facility] CONSULT Reason for consult: diarrhea Referring Physician: [facility]. PCP: Dr. Drema [place]. Primary G.I.: Dr. Percell Boston is an 70 y.o. male.  HPI: he has admitted after about a 2 month history of changing bile habits in a one-month history of severe diarrhea. Prior to that time he states that his bowel movements were soft. He sees Dr. Penelope Coop and has had previous colonoscopy in 2011 revealing diverticulosis and internal hemorrhoids. He sees Dr. Narda Amber as well for treatment of GERD and for counseling about alcohol use. He was hospitalized so I don't 2014 with diarrhea and rectal bleeding and had a sigmoidoscopy performed by myself revealing massive hemorrhoids. Was subsequently followed by Dr. Rosendo Gros. This must've gotten better. 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 The patient followed up in the office with Dr. Penelope Coop and apparently was not having diarrhea but was having a lot of nausea and vomiting and CT scan showed retroperitoneal inflammation he was started on medicine for GERD apparently got better. He had an upper G.I. by Dr. Penelope Coop that was negative and has had PET scans and CT scans to evaluate the abdominal masses and increase lymphadenopathy. These of all been ordered over the past couple years usually through the emergency room. These showed no hyperactive adenopathy and questionable early cirrhosis. The patient reports that he got better and his loose stools improved. During this time he was drinking approximately 1 1/2 to 2 pints a day of whiskey.  For 2 months or so he's been having changing bowel movements that have turned into progressive diarrhea. He stopped eating and during this time has lost weight but is unable to tell me how much. He's had no blood in the stool, nausea or vomiting, melena. He reports it is now is having 10 to 14 bowel movements over 24 hour period through loose watery. Initially, there was occasionally some form to  them but this has gone away and now it's completely watery diarrhea. He typically will wake up a couple times during the night to have a bowel movement. He has a cat and recently a dog has been staying at their house. He lives with his daughter who has not been set. He is not traveled outside of the country or had any other exposure to animals. He has city water. He was becoming progressively weaker and states that he nearly passed out which is what brought him to the emergency room. He reports that he gave up smoking and drinking approximately a month ago and kept thinking some of these changes would help his diarrhea but they didn't. He stopped eating solids only taking liquids and ensure that hasn't helped either. Evaluation in the emergency room revealed significant renal failure with creatinine of 6.3 B 31. Potassium was 2.8. He has received a lot of IV fluids. His WBC was 14.0 INR was normal at 1.09 he apparently fell at home and passed out and hit his scapula but x-ray was normal. CT scan showed atherosclerosis and continued mesenteric adenopathy that was no worse but no better. Patient has received several leaders of IV fluid and is being admitted. Stool studies have been ordered but not yet obtained. The patient has a multitude of other chronic problems including nonischemic cardiomyopathy with chronic heart failure and arrhythmias requiring biventricular A ICD, history of previous strokes, alcohol abuse   Past Medical History:  Diagnosis Date  . Alcohol abuse    PREVIOUS H/O DRINKING A FIFTH OF VODKA  A DAY  .  Anginal pain ([place])   . Arthritis    hands & back  . CHF (congestive heart failure) ([place])   . Chronic kidney disease    acute renal  04/2012  . Chronic systolic heart failure (HCC)    LEFT  . Coronary artery disease   . History of angina   . Hypertension   . ICD (implantable cardiac defibrillator) in place   . LBBB (left bundle branch block)   . Nonischemic cardiomyopathy (HCC)     EF 20% s/p INSERTION OF BI-V DEFIBRILLATOR.  . Stroke ([place])    TIA's  . Thyroid disease    HYPOTHYROIDISM.....TREATED  . TIA (transient ischemic attack)     Past Surgical History:  Procedure Laterality Date  . CARDIAC CATHETERIZATION    . CYSTOSCOPY W/ STONE MANIPULATION    . FLEXIBLE SIGMOIDOSCOPY N/A 04/29/2012   Procedure: FLEXIBLE SIGMOIDOSCOPY;  Surgeon: Winfield Cunas., MD;  Location: [facility] ENDOSCOPY;  Service: Endoscopy;  Laterality: N/A;  . IMPLANTABLE CARDIOVERTER DEFIBRILLATOR (ICD) GENERATOR CHANGE Bilateral 12/25/2011   Procedure: ICD GENERATOR CHANGE;  Surgeon: Evans Lance, MD;  Location: [facility] CATH LAB;  Service: Cardiovascular;  Laterality: Bilateral;  . INSERT / REPLACE / REMOVE PACEMAKER     BI-V IMPLANTABLE CARDIOVERTER-DEFIBRILLATOR. ; [employer]; MODEL  # [id number], SERIAL # [id number] H.  DR. Ringsted    . VASECTOMY      Family History  Problem Relation Age of Onset  . Heart disease Mother   . Heart disease Father   . Hypertension      Social History:  reports that he has been smoking.  He has a 64.50 pack-year smoking history. He has never used smokeless tobacco. He reports that he does not drink alcohol or use drugs.  Allergies:  Allergies  Allergen Reactions  . Sulfa Antibiotics Nausea And Vomiting    Medications; Prior to Admission medications   Medication Sig Start Date End Date Taking? Authorizing Provider  allopurinol (ZYLOPRIM) 100 MG tablet Take 100 mg by mouth daily.    Yes Historical Provider, MD  aspirin 81 MG tablet Take 1 tablet (81 mg total) by mouth daily. 09/20/15  Yes Mauricio Gerome Apley, MD  atorvastatin (LIPITOR) 40 MG tablet Take 40 mg by mouth daily.  09/03/11  Yes Historical Provider, MD  benazepril (LOTENSIN) 10 MG tablet Take 10 mg by mouth daily.   Yes Historical Provider, MD  calcium carbonate (TUMS - DOSED IN MG ELEMENTAL CALCIUM) 500 MG chewable tablet Chew 1 tablet by mouth daily.   Yes  Historical Provider, MD  carvedilol (COREG) 6.25 MG tablet Take 6.25 mg by mouth 2 (two) times daily with a meal.   Yes Historical Provider, MD  Cholecalciferol 4000 UNITS CAPS Take 1 capsule by mouth daily.   Yes Historical Provider, MD  clopidogrel (PLAVIX) 75 MG tablet Take 1 tablet (75 mg total) by mouth daily. 09/20/15  Yes Mauricio Gerome Apley, MD  furosemide (LASIX) 20 MG tablet Take 20 mg by mouth daily.   Yes Historical Provider, MD  levothyroxine (SYNTHROID, LEVOTHROID) 100 MCG tablet Take 100 mcg by mouth daily before breakfast.   Yes Historical Provider, MD  nitroGLYCERIN (NITROSTAT) 0.4 MG SL tablet Place 0.4 mg under the tongue every 5 (five) minutes as needed for chest pain.    Yes Historical Provider, MD  OVER THE COUNTER MEDICATION Take 1 tablet by mouth 2 (two) times daily. Multi-allergy tablet   Yes Historical Provider, MD  potassium chloride (K-DUR)  10 MEQ tablet Take 2 tablets (20 mEq total) by mouth daily. 09/20/15  Yes Mauricio Gerome Apley, MD   . Derrill Memo ON 10/13/2015] allopurinol  100 mg Oral QODAY  . aspirin EC  81 mg Oral Daily  . atorvastatin  40 mg Oral Daily  . carvedilol  6.25 mg Oral BID WC  . [START ON 10/13/2015] ciprofloxacin  400 mg Intravenous Q24H  . clopidogrel  75 mg Oral Daily  . heparin  5,000 Units Subcutaneous Q8H  . levothyroxine  100 mcg Oral QAC breakfast  . metronidazole  500 mg Intravenous Q6H  . sodium chloride flush  3 mL Intravenous Q12H   PRN Meds acetaminophen **OR** acetaminophen, HYDROcodone-acetaminophen, nitroGLYCERIN, ondansetron **OR** ondansetron (ZOFRAN) IV, traZODone Results for orders placed or performed during the hospital encounter of 10/12/15 (from the past 48 hour(s))  CBC with Differential/Platelet     Status: Abnormal   Collection Time: 10/12/15  5:38 AM  Result Value Ref Range   WBC 14.0 (H) 4.0 - 10.5 K/uL   RBC 4.66 4.22 - 5.81 MIL/uL   Hemoglobin 14.6 13.0 - 17.0 g/dL   HCT 43.3 39.0 - 52.0 %   MCV 92.9 78.0 - 100.0  fL   MCH 31.3 26.0 - 34.0 pg   MCHC 33.7 30.0 - 36.0 g/dL   RDW 13.9 11.5 - 15.5 %   Platelets 198 150 - 400 K/uL   Neutrophils Relative % 75 %   Neutro Abs 10.6 (H) 1.7 - 7.7 K/uL   Lymphocytes Relative 15 %   Lymphs Abs 2.0 0.7 - 4.0 K/uL   Monocytes Relative 9 %   Monocytes Absolute 1.2 (H) 0.1 - 1.0 K/uL   Eosinophils Relative 1 %   Eosinophils Absolute 0.1 0.0 - 0.7 K/uL   Basophils Relative 0 %   Basophils Absolute 0.0 0.0 - 0.1 K/uL  Comprehensive metabolic panel     Status: Abnormal   Collection Time: 10/12/15  5:38 AM  Result Value Ref Range   Sodium 137 135 - 145 mmol/L   Potassium 2.8 (L) 3.5 - 5.1 mmol/L   Chloride 98 (L) 101 - 111 mmol/L   CO2 22 22 - 32 mmol/L   Glucose, Bld 115 (H) 65 - 99 mg/dL   BUN 31 (H) 6 - 20 mg/dL   Creatinine, Ser 6.39 (H) 0.61 - 1.24 mg/dL   Calcium 8.7 (L) 8.9 - 10.3 mg/dL   Total Protein 6.8 6.5 - 8.1 g/dL   Albumin 3.3 (L) 3.5 - 5.0 g/dL   AST 28 15 - 41 U/L   ALT 21 17 - 63 U/L   Alkaline Phosphatase 60 38 - 126 U/L   Total Bilirubin 0.6 0.3 - 1.2 mg/dL   GFR calc non Af Amer 8 (L) >60 mL/min   GFR calc Af Amer 9 (L) >60 mL/min    Comment: (NOTE) The eGFR has been calculated using the CKD EPI equation. This calculation has not been validated in all clinical situations. eGFR's persistently <60 mL/min signify possible Chronic Kidney Disease.    Anion gap 17 (H) 5 - 15  Lipase, blood     Status: None   Collection Time: 10/12/15  5:38 AM  Result Value Ref Range   Lipase 22 11 - 51 U/L  Magnesium     Status: None   Collection Time: 10/12/15  5:38 AM  Result Value Ref Range   Magnesium 1.8 1.7 - 2.4 mg/dL  I-stat troponin, ED     Status: None  Collection Time: 10/12/15  6:39 AM  Result Value Ref Range   Troponin i, poc 0.01 0.00 - 0.08 ng/mL   Comment 3            Comment: Due to the release kinetics of cTnI, a negative result within the first hours of the onset of symptoms does not rule out myocardial infarction with  certainty. If myocardial infarction is still suspected, repeat the test at appropriate intervals.   TSH     Status: Abnormal   Collection Time: 10/12/15  8:10 AM  Result Value Ref Range   TSH 0.112 (L) 0.350 - 4.500 uIU/mL  I-Stat CG4 Lactic Acid, ED     Status: None   Collection Time: 10/12/15  8:23 AM  Result Value Ref Range   Lactic Acid, Venous 1.35 0.5 - 1.9 mmol/L  Lactic acid, plasma     Status: None   Collection Time: 10/12/15  8:45 AM  Result Value Ref Range   Lactic Acid, Venous 1.7 0.5 - 1.9 mmol/L  Lactic acid, plasma     Status: None   Collection Time: 10/12/15 10:00 AM  Result Value Ref Range   Lactic Acid, Venous 1.3 0.5 - 1.9 mmol/L  Procalcitonin     Status: None   Collection Time: 10/12/15 10:00 AM  Result Value Ref Range   Procalcitonin 0.17 ng/mL    Comment:        Interpretation: PCT (Procalcitonin) <= 0.5 ng/mL: Systemic infection (sepsis) is not likely. Local bacterial infection is possible. (NOTE)         ICU PCT Algorithm               Non ICU PCT Algorithm    ----------------------------     ------------------------------         PCT < 0.25 ng/mL                 PCT < 0.1 ng/mL     Stopping of antibiotics            Stopping of antibiotics       strongly encouraged.               strongly encouraged.    ----------------------------     ------------------------------       PCT level decrease by               PCT < 0.25 ng/mL       >= 80% from peak PCT       OR PCT 0.25 - 0.5 ng/mL          Stopping of antibiotics                                             encouraged.     Stopping of antibiotics           encouraged.    ----------------------------     ------------------------------       PCT level decrease by              PCT >= 0.25 ng/mL       < 80% from peak PCT        AND PCT >= 0.5 ng/mL            Continuin g antibiotics  encouraged.       Continuing antibiotics            encouraged.     ----------------------------     ------------------------------     PCT level increase compared          PCT > 0.5 ng/mL         with peak PCT AND          PCT >= 0.5 ng/mL             Escalation of antibiotics                                          strongly encouraged.      Escalation of antibiotics        strongly encouraged.   Protime-INR     Status: None   Collection Time: 10/12/15 10:00 AM  Result Value Ref Range   Prothrombin Time 14.1 11.4 - 15.2 seconds   INR 1.09   APTT     Status: None   Collection Time: 10/12/15 10:00 AM  Result Value Ref Range   aPTT 27 24 - 36 seconds    Ct Abdomen Pelvis Wo Contrast  Result Date: 10/12/2015 CLINICAL DATA:  Severe diarrhea.  Periumbilical abdominal pain. EXAM: CT ABDOMEN AND PELVIS WITHOUT CONTRAST TECHNIQUE: Multidetector CT imaging of the abdomen and pelvis was performed following the standard protocol without IV contrast. COMPARISON:  CT scan of September 04, 2014 and February 14, 2014. FINDINGS: Mild multilevel degenerative disc disease is noted in the lower lumbar spine. Visualized lung bases are unremarkable. No gallstones are noted. Stable left hepatic cyst is noted. The spleen and pancreas are unremarkable on these unenhanced images. Adrenal glands and kidneys appear normal. No hydronephrosis or renal obstruction is noted. No renal or ureteral calculi are noted. Atherosclerosis of abdominal aorta is noted without aneurysm formation. The appendix appears normal. There is no evidence of bowel obstruction. Stable mildly enlarged mesenteric adenopathy is noted most consistent with benign or reactive etiology. This most likely represents reactive or inflammatory nodes. Stable 2 cm low density seen medial to right kidney most consistent with benign process. IMPRESSION: Stable mesenteric adenopathy most likely benign or reactive in etiology. Aortic atherosclerosis. No acute abnormality seen in the abdomen or pelvis. Electronically Signed   By: Marijo Conception, M.D.   On: 10/12/2015 11:06   Dg Scapula Right  Result Date: 10/12/2015 CLINICAL DATA:  Recent fall with right shoulder pain, initial encounter EXAM: RIGHT SCAPULA - 2+ VIEWS COMPARISON:  None. FINDINGS: Mild degenerative changes of the acromioclavicular joint are seen. No fracture or dislocation is noted. The underlying bony thorax is within normal limits. IMPRESSION: No acute abnormality noted. Electronically Signed   By: Inez Catalina M.D.   On: 10/12/2015 10:10   Dg Chest Port 1 View  Result Date: 10/12/2015 CLINICAL DATA:  Weakness, possible sepsis EXAM: PORTABLE CHEST 1 VIEW COMPARISON:  09/04/2014 FINDINGS: Cardiac shadow is stable. A defibrillator is again seen. No focal infiltrate or sizable effusion is noted. No bony abnormality is seen. IMPRESSION: No active disease. Electronically Signed   By: Inez Catalina M.D.   On: 10/12/2015 10:09               Blood pressure 93/68, pulse 78, temperature 98.7 F (37.1 C), temperature source Rectal, resp. rate 22, height '6\' 1"'  (1.854 m), weight  99.8 kg (220 lb), SpO2 100 %.  Physical exam:   General-- pale white male no acute distress ENT-- nonicteric Neck-- no gross lymphadenopathy Heart-- regular rate and rhythm without murmurs are gallops Lungs-- clear Abdomen-- normal bowel sounds completely soft and nontender Psych-- alert and oriented answers questions appropriately   Assessment: 1. Diarrhea/weight loss. This is a confusing situation. 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 physician in the office and was having normal stools subsequent and this was never really treated. He has had CT scans and pet scans showing retroperitoneal mass in diffuse adenopathy that is been stable and still somewhat unexplained at this point. He notes that he was doing well until about 2 months ago with normal bowel movements but now is having loose stools and losing weight. Certainly workup for enteric pathogens  parasites needs to be done and probable repeat biopsies for collagenase colitis. May need small bowel biopsy for celiac disease as well. He has significant drinking history so pancreatic insufficiency is also possible 2. Acute on chronic renal failure probably exacerbated by his diarrhea 3. History of cardiac arrhythmias and severe congestive heart failure with A ICD  Plan: 1. We will go ahead and get stool studies for enteric pathogen C. difficile etc. I will also add pancreatic elastase to evaluate for pancreatic insufficiency. 2. Will check serum TTG to evaluate for celiac disease. 3. If no infectious causes determined I think he should have another sigmoidoscopy with biopsy to evaluate for microscopic colitis. 4. We will need to give aggressive fluid replacement try to correct his acute renal failure.   Nicklous Aburto JR,Nalleli Largent L 10/12/2015, 11:27 AM   This note was created using voice recognition software and minor errors may Have occurred unintentionally. Pager: [phone] If no answer or after hours call [phone]

## 2015-10-12 NOTE — Progress Notes (Signed)
Pharmacy Antibiotic Note  Don [name] is a 70 y.o. male admitted on 10/12/2015 with inta-abdominal infection.  Pharmacy has been consulted for cipro dosing.  Patient in AKI with SCr of 6.39 (was 0.66 on 09/20/15)  Plan: Cipro 400 mg IV q24h  Height: 6\' 1"  (185.4 cm) Weight: 220 lb (99.8 kg) IBW/kg (Calculated) : 79.9  Temp (24hrs), Avg:98.7 F (37.1 C), Min:98.7 F (37.1 C), Max:98.7 F (37.1 C)   Recent Labs Lab 10/12/15 0538 10/12/15 0823  WBC 14.0*  --   CREATININE 6.39*  --   LATICACIDVEN  --  1.35    Estimated Creatinine Clearance: 13.6 mL/min (by C-G formula based on SCr of 6.39 mg/dL).    Allergies  Allergen Reactions  . Sulfa Antibiotics Nausea And Vomiting   Isaac Bliss, PharmD, BCPS, [facility] Clinical Pharmacist Pager [phone] 10/12/2015 8:56 AM

## 2015-10-13 DIAGNOSIS — Z95810 Presence of automatic (implantable) cardiac defibrillator: Secondary | ICD-10-CM | POA: Diagnosis not present

## 2015-10-13 DIAGNOSIS — K529 Noninfective gastroenteritis and colitis, unspecified: Secondary | ICD-10-CM | POA: Diagnosis not present

## 2015-10-13 DIAGNOSIS — N170 Acute kidney failure with tubular necrosis: Principal | ICD-10-CM

## 2015-10-13 DIAGNOSIS — E860 Dehydration: Secondary | ICD-10-CM

## 2015-10-13 DIAGNOSIS — I5042 Chronic combined systolic (congestive) and diastolic (congestive) heart failure: Secondary | ICD-10-CM

## 2015-10-13 DIAGNOSIS — K591 Functional diarrhea: Secondary | ICD-10-CM | POA: Diagnosis not present

## 2015-10-13 LAB — CBC
HCT: 35.5 % — ABNORMAL LOW (ref 39.0–52.0)
HEMOGLOBIN: 11.4 g/dL — AB (ref 13.0–17.0)
MCH: 30.5 pg (ref 26.0–34.0)
MCHC: 32.1 g/dL (ref 30.0–36.0)
MCV: 94.9 fL (ref 78.0–100.0)
PLATELETS: 122 10*3/uL — AB (ref 150–400)
RBC: 3.74 MIL/uL — AB (ref 4.22–5.81)
RDW: 14 % (ref 11.5–15.5)
WBC: 6 10*3/uL (ref 4.0–10.5)

## 2015-10-13 LAB — BASIC METABOLIC PANEL
ANION GAP: 6 (ref 5–15)
BUN: 24 mg/dL — ABNORMAL HIGH (ref 6–20)
CALCIUM: 6.8 mg/dL — AB (ref 8.9–10.3)
CHLORIDE: 115 mmol/L — AB (ref 101–111)
CO2: 19 mmol/L — ABNORMAL LOW (ref 22–32)
CREATININE: 3.54 mg/dL — AB (ref 0.61–1.24)
GFR calc non Af Amer: 16 mL/min — ABNORMAL LOW (ref >60)
GFR, EST AFRICAN AMERICAN: 19 mL/min — AB (ref >60)
Glucose, Bld: 91 mg/dL (ref 65–99)
Potassium: 3.1 mmol/L — ABNORMAL LOW (ref 3.5–5.1)
Sodium: 140 mmol/L (ref 135–145)

## 2015-10-13 LAB — GLUCOSE, CAPILLARY: GLUCOSE-CAPILLARY: 90 mg/dL (ref 65–99)

## 2015-10-13 MED ORDER — LORAZEPAM 2 MG/ML IJ SOLN
0.0000 mg | Freq: Four times a day (QID) | INTRAMUSCULAR | Status: DC
  Administered 2015-10-13: 2 mg via INTRAVENOUS
  Filled 2015-10-13: qty 1

## 2015-10-13 MED ORDER — DIPHENOXYLATE-ATROPINE 2.5-0.025 MG PO TABS
1.0000 | ORAL_TABLET | Freq: Two times a day (BID) | ORAL | Status: DC
  Administered 2015-10-13 – 2015-10-14 (×2): 1 via ORAL
  Filled 2015-10-13 (×2): qty 1

## 2015-10-13 MED ORDER — ENSURE ENLIVE PO LIQD
237.0000 mL | Freq: Two times a day (BID) | ORAL | Status: DC
  Administered 2015-10-13: 237 mL via ORAL

## 2015-10-13 MED ORDER — LORAZEPAM 2 MG/ML IJ SOLN: 1.0000 mg | Freq: Four times a day (QID) | INTRAMUSCULAR | Status: DC | PRN

## 2015-10-13 MED ORDER — LORAZEPAM 2 MG/ML IJ SOLN: 0.0000 mg | Freq: Two times a day (BID) | INTRAMUSCULAR | Status: DC

## 2015-10-13 MED ORDER — ZOLPIDEM TARTRATE 5 MG PO TABS
5.0000 mg | ORAL_TABLET | Freq: Once | ORAL | Status: AC
  Administered 2015-10-14: 5 mg via ORAL
  Filled 2015-10-13: qty 1

## 2015-10-13 MED ORDER — FOLIC ACID 1 MG PO TABS
1.0000 mg | ORAL_TABLET | Freq: Every day | ORAL | Status: DC
  Administered 2015-10-13 – 2015-10-15 (×3): 1 mg via ORAL
  Filled 2015-10-13 (×3): qty 1

## 2015-10-13 MED ORDER — SACCHAROMYCES BOULARDII 250 MG PO CAPS
250.0000 mg | ORAL_CAPSULE | Freq: Two times a day (BID) | ORAL | Status: DC
  Administered 2015-10-13 – 2015-10-15 (×5): 250 mg via ORAL
  Filled 2015-10-13 (×5): qty 1

## 2015-10-13 MED ORDER — LORAZEPAM 1 MG PO TABS: 1.0000 mg | ORAL_TABLET | Freq: Four times a day (QID) | ORAL | Status: DC | PRN

## 2015-10-13 MED ORDER — VITAMIN B-1 100 MG PO TABS
100.0000 mg | ORAL_TABLET | Freq: Every day | ORAL | Status: DC
  Administered 2015-10-13 – 2015-10-15 (×3): 100 mg via ORAL
  Filled 2015-10-13 (×3): qty 1

## 2015-10-13 MED ORDER — METRONIDAZOLE IN NACL 5-0.79 MG/ML-% IV SOLN
500.0000 mg | Freq: Three times a day (TID) | INTRAVENOUS | Status: DC
  Filled 2015-10-13 (×2): qty 100

## 2015-10-13 MED ORDER — LEVOTHYROXINE SODIUM 50 MCG PO TABS
50.0000 ug | ORAL_TABLET | Freq: Every day | ORAL | Status: DC
  Administered 2015-10-14 – 2015-10-15 (×2): 50 ug via ORAL
  Filled 2015-10-13 (×2): qty 1

## 2015-10-13 MED ORDER — ADULT MULTIVITAMIN W/MINERALS CH
1.0000 | ORAL_TABLET | Freq: Every day | ORAL | Status: DC
  Administered 2015-10-13 – 2015-10-15 (×3): 1 via ORAL
  Filled 2015-10-13 (×3): qty 1

## 2015-10-13 MED ORDER — THIAMINE HCL 100 MG/ML IJ SOLN
100.0000 mg | Freq: Every day | INTRAMUSCULAR | Status: DC
Start: 2015-10-13 — End: 2015-10-13

## 2015-10-13 MED ORDER — ENSURE ENLIVE PO LIQD
237.0000 mL | Freq: Three times a day (TID) | ORAL | Status: DC
  Administered 2015-10-13 – 2015-10-15 (×5): 237 mL via ORAL

## 2015-10-13 NOTE — Progress Notes (Addendum)
PROGRESS NOTE  Don [name]  ZOX:[id number] DOB: 1945-06-23 DOA: 10/12/2015 PCP: [name], MD  Brief Narrative:    Don [name] is a 70 y.o. male with history of nonischemic cardio myopathy, chronic systolic heart failure, left bundle branch block, biventricular ICD, and chronic kidney disease who presented to the emergency department with severe weakness and chronic watery, nonbloody diarrhea. No associated abdominal pain, he has had intermittent nausea without vomiting . He passed out 3 times at home yesterday with preceding dizziness. Vision has not been taking much by mouth the last 2 months, his diarrhea is not dependent on oral intake, he wakes up in the middle of the night with watery stools.  No antibiotics or medication changes in the last few months except for addition of Plavix. Prior to 2 months ago patient's bowel movements were solid, once daily. No fevers at home but patient will continue wet from sweating. His appetite is poor.    Assessment & Plan:   Active Problems:   Biventricular implantable cardioverter-defibrillator in situ   Chronic combined systolic (congestive) and diastolic (congestive) heart failure (HCC)   Acute renal failure (HCC)   Hypovolemic shock (HCC)   Diarrhea   Fall   Dehydration   History of stroke   HLD (hyperlipidemia)   Sepsis (HCC)   Pain in scapula   Chronic diarrhea  Chronic/subacute diarrhea with associated severe dehydration, AKI and weight loss.  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  Doubt sepsis given lack of fever, leukocytosis.  Vital signs and AKI likely reflective of dehydration.  Suspicious for pancreatic insufficiency given EtOH abuse - CT abd/pelvis:  2cm stable mass next to the right kidney and stable mesenteric adenopathy - Check serotonin level - Stool pathogen PCR negative - d/c cipro and flagyl -  F/u tissue transglutaminase -  Add urine 5-HIAA to screen for carcinoid  syndrome - apprecaite GI assistance  AKI secondary to ATN from severe chronic dehydration and ACEI and NSAID use.  Baseline creatinine 0.6-0.7.  Creatinine 6.39 on admission. - hold home ACEI, avoid nephrotoxic medications.  - continue IVF  Syncope due to orthostasis and dehydration -  Feeling better today after IVF -  Tele:  biv paced.  EtOH abuse, at risk for withdrawal -  CIWA protocol, thiamine, folate, MVI  Right scapular pain post falls at home.  No evidence of fracture on scapula and chest x-ray.  Hypokalemia, secondary to diarrhea, improving -  Continue potassium in IVF -  Magnesium was repleted  Hx of CVA. Started on Plavix in early August.  . -continue home plavix  Retroperitoneal mass and mesenteric lymphadenopathy -  Followed by cancer center.  Felt to be benign given stability over many years.  Hypothyroidism, hx of graves s/p ablation.  TSH 0.137 > may be contributing to his diarrhea  - reduce home synthroid to daily  Ischemic cardiomyopathy / chronic combined systolic and diastolic heart failure /  BiVentricular defibrillator in place.  EF 40% -continue home coreg  Hyperlipidemia -continue home statin  Gout.  -resume home allopurinol 100mg  but decrease to QOD for now given AKI                                     DVT prophylaxis:    SQ Heparin  Code Status:     Full code   Family Communication:    patient alone Disposition Plan:  Discharge in 3-4 days               Consultants:   Dr. Randa EvensEdwards, Gastroenterology  Procedures:  none  Antimicrobials:   Cipro/flagyl on 8/25     Subjective:  Feeling a little better after IVF.  Still feeling lightheaded.  Denies shortness of breath, chest pains, abdominal pains.  Ongoing watery diarrhea.    Objective: Vitals:   10/13/15 0339 10/13/15 0500 10/13/15 0600 10/13/15 0842  BP: 97/68 107/74 100/63 115/78  Pulse:  72 63   Resp:  14 18   Temp: 98.4 F (36.9 C)   98.4 F (36.9 C)  TempSrc:  Oral   Oral  SpO2:  98% 97%   Weight: 99.9 kg (220 lb 3.8 oz)     Height:        Intake/Output Summary (Last 24 hours) at 10/13/15 1426 Last data filed at 10/13/15 0559  Gross per 24 hour  Intake          1778.75 ml  Output              500 ml  Net          1278.75 ml   Filed Weights   10/12/15 0536 10/13/15 0339  Weight: 99.8 kg (220 lb) 99.9 kg (220 lb 3.8 oz)    Examination:  General exam:  Adult male.  No acute distress.   HEENT:  NCAT, MMM, no tongue fasciculations Respiratory system: Clear to auscultation bilaterally Cardiovascular system: Regular rate and rhythm, normal S1/S2. 2/6 systolic murmur at the LSB.  Warm extremities Gastrointestinal system: Normal active bowel sounds, soft, nondistended, nontender. MSK:  Normal tone and bulk, no lower extremity edema Neuro:  Grossly intact.  No obvious tremors.    Data Reviewed: I have personally reviewed following labs and imaging studies  CBC:  Recent Labs Lab 10/12/15 0538 10/13/15 0255  WBC 14.0* 6.0  NEUTROABS 10.6*  --   HGB 14.6 11.4*  HCT 43.3 35.5*  MCV 92.9 94.9  PLT 198 122*   Basic Metabolic Panel:  Recent Labs Lab 10/12/15 0538 10/12/15 1638 10/13/15 0255  NA 137 139 140  K 2.8* 3.1* 3.1*  CL 98* 112* 115*  CO2 22 17* 19*  GLUCOSE 115* 100* 91  BUN 31* 27* 24*  CREATININE 6.39* 4.75* 3.54*  CALCIUM 8.7* 7.1* 6.8*  MG 1.8  --   --    GFR: Estimated Creatinine Clearance: 24.5 mL/min (by C-G formula based on SCr of 3.54 mg/dL). Liver Function Tests:  Recent Labs Lab 10/12/15 0538  AST 28  ALT 21  ALKPHOS 60  BILITOT 0.6  PROT 6.8  ALBUMIN 3.3*    Recent Labs Lab 10/12/15 0538  LIPASE 22   No results for input(s): AMMONIA in the last 168 hours. Coagulation Profile:  Recent Labs Lab 10/12/15 1000  INR 1.09   Cardiac Enzymes:  Recent Labs Lab 10/12/15 1000 10/12/15 1358  TROPONINI <0.03 <0.03   BNP (last 3 results) No results for input(s): PROBNP in the last  8760 hours. HbA1C: No results for input(s): HGBA1C in the last 72 hours. CBG:  Recent Labs Lab 10/13/15 1202  GLUCAP 90   Lipid Profile: No results for input(s): CHOL, HDL, LDLCALC, TRIG, CHOLHDL, LDLDIRECT in the last 72 hours. Thyroid Function Tests:  Recent Labs  10/12/15 1000  TSH 0.137*   Anemia Panel: No results for input(s): VITAMINB12, FOLATE, FERRITIN, TIBC, IRON, RETICCTPCT in the last 72 hours. Urine analysis:  Component Value Date/Time   COLORURINE YELLOW 10/12/2015 2143   APPEARANCEUR CLEAR 10/12/2015 2143   LABSPEC 1.017 10/12/2015 2143   PHURINE 5.5 10/12/2015 2143   GLUCOSEU NEGATIVE 10/12/2015 2143   HGBUR NEGATIVE 10/12/2015 2143   BILIRUBINUR NEGATIVE 10/12/2015 2143   KETONESUR NEGATIVE 10/12/2015 2143   PROTEINUR NEGATIVE 10/12/2015 2143   UROBILINOGEN 1.0 09/04/2014 1554   NITRITE NEGATIVE 10/12/2015 2143   LEUKOCYTESUR NEGATIVE 10/12/2015 2143   Sepsis Labs: @LABRCNTIP (procalcitonin:4,lacticidven:4)  ) Recent Results (from the past 240 hour(s))  MRSA PCR Screening     Status: None   Collection Time: 10/12/15 11:39 AM  Result Value Ref Range Status   MRSA by PCR NEGATIVE NEGATIVE Final    Comment:        The GeneXpert MRSA Assay (FDA approved for NASAL specimens only), is one component of a comprehensive MRSA colonization surveillance program. It is not intended to diagnose MRSA infection nor to guide or monitor treatment for MRSA infections.   Gastrointestinal Panel by PCR , Stool     Status: None   Collection Time: 10/12/15 11:52 AM  Result Value Ref Range Status   Campylobacter species NOT DETECTED NOT DETECTED Final   Plesimonas shigelloides NOT DETECTED NOT DETECTED Final   Salmonella species NOT DETECTED NOT DETECTED Final   Yersinia enterocolitica NOT DETECTED NOT DETECTED Final   Vibrio species NOT DETECTED NOT DETECTED Final   Vibrio cholerae NOT DETECTED NOT DETECTED Final   Enteroaggregative E coli (EAEC) NOT  DETECTED NOT DETECTED Final   Enteropathogenic E coli (EPEC) NOT DETECTED NOT DETECTED Final   Enterotoxigenic E coli (ETEC) NOT DETECTED NOT DETECTED Final   Shiga like toxin producing E coli (STEC) NOT DETECTED NOT DETECTED Final   E. coli O157 NOT DETECTED NOT DETECTED Final   Shigella/Enteroinvasive E coli (EIEC) NOT DETECTED NOT DETECTED Final   Cryptosporidium NOT DETECTED NOT DETECTED Final   Cyclospora cayetanensis NOT DETECTED NOT DETECTED Final   Entamoeba histolytica NOT DETECTED NOT DETECTED Final   Giardia lamblia NOT DETECTED NOT DETECTED Final   Adenovirus F40/41 NOT DETECTED NOT DETECTED Final   Astrovirus NOT DETECTED NOT DETECTED Final   Norovirus GI/GII NOT DETECTED NOT DETECTED Final   Rotavirus A NOT DETECTED NOT DETECTED Final   Sapovirus (I, II, IV, and V) NOT DETECTED NOT DETECTED Final      Radiology Studies: Ct Abdomen Pelvis Wo Contrast  Result Date: 10/12/2015 CLINICAL DATA:  Severe diarrhea.  Periumbilical abdominal pain. EXAM: CT ABDOMEN AND PELVIS WITHOUT CONTRAST TECHNIQUE: Multidetector CT imaging of the abdomen and pelvis was performed following the standard protocol without IV contrast. COMPARISON:  CT scan of September 04, 2014 and February 14, 2014. FINDINGS: Mild multilevel degenerative disc disease is noted in the lower lumbar spine. Visualized lung bases are unremarkable. No gallstones are noted. Stable left hepatic cyst is noted. The spleen and pancreas are unremarkable on these unenhanced images. Adrenal glands and kidneys appear normal. No hydronephrosis or renal obstruction is noted. No renal or ureteral calculi are noted. Atherosclerosis of abdominal aorta is noted without aneurysm formation. The appendix appears normal. There is no evidence of bowel obstruction. Stable mildly enlarged mesenteric adenopathy is noted most consistent with benign or reactive etiology. This most likely represents reactive or inflammatory nodes. Stable 2 cm low density seen  medial to right kidney most consistent with benign process. IMPRESSION: Stable mesenteric adenopathy most likely benign or reactive in etiology. Aortic atherosclerosis. No acute abnormality seen in the abdomen or  pelvis. Electronically Signed   By: Lupita [name], M.D.   On: 10/12/2015 11:06   Dg Scapula Right  Result Date: 10/12/2015 CLINICAL DATA:  Recent fall with right shoulder pain, initial encounter EXAM: RIGHT SCAPULA - 2+ VIEWS COMPARISON:  None. FINDINGS: Mild degenerative changes of the acromioclavicular joint are seen. No fracture or dislocation is noted. The underlying bony thorax is within normal limits. IMPRESSION: No acute abnormality noted. Electronically Signed   By: Alcide Clever M.D.   On: 10/12/2015 10:10   Dg Chest Port 1 View  Result Date: 10/12/2015 CLINICAL DATA:  Weakness, possible sepsis EXAM: PORTABLE CHEST 1 VIEW COMPARISON:  09/04/2014 FINDINGS: Cardiac shadow is stable. A defibrillator is again seen. No focal infiltrate or sizable effusion is noted. No bony abnormality is seen. IMPRESSION: No active disease. Electronically Signed   By: Alcide Clever M.D.   On: 10/12/2015 10:09     Scheduled Meds: . allopurinol  100 mg Oral QODAY  . aspirin EC  81 mg Oral Daily  . atorvastatin  40 mg Oral Daily  . carvedilol  6.25 mg Oral BID WC  . ciprofloxacin  400 mg Intravenous Q24H  . clopidogrel  75 mg Oral Daily  . feeding supplement (ENSURE ENLIVE)  237 mL Oral BID BM  . heparin  5,000 Units Subcutaneous Q8H  . levothyroxine  100 mcg Oral QAC breakfast  . metronidazole  500 mg Intravenous Q8H  . saccharomyces boulardii  250 mg Oral BID  . sodium chloride flush  3 mL Intravenous Q12H   Continuous Infusions: . 0.9 % NaCl with KCl 40 mEq / L 100 mL/hr (10/13/15 0915)     LOS: 1 day    Time spent: 30 min    Renae Fickle, MD [facility] Pager [phone]  If 7PM-7AM, please contact night-coverage [email or web address] Password [email or web address] 10/13/2015, 2:26 PM

## 2015-10-13 NOTE — Progress Notes (Signed)
Initial Nutrition Assessment  DOCUMENTATION CODES:  Not applicable  INTERVENTION:  Ensure Enlive po TID, each supplement provides 350 kcal and 20 grams of protein  Food preferences  NUTRITION DIAGNOSIS:  Inadequate oral intake related to Severe diarrhea as evidenced by per patient report of having almost nothing but 2-3 ensures/day x 2 months  GOAL:  Patient will meet greater than or equal to 90% of their needs  MONITOR:  PO intake, Supplement acceptance, Labs, Weight trends  REASON FOR ASSESSMENT:  Malnutrition Screening Tool    ASSESSMENT:  70 y/o male PMHx cardiomyopathy, CHF, CKD, CAD, TIA, HTN, etoh abuse. Presented with severe weakness and water, nonbloody diarrhea. Has passed out 3x. Reported poor po intake/appetite x 2 months. Worked up for SIRS/Possible sepsis, AKI, severe dehydration.   Pt reports that he has had poor po intake for the last 2 months. He said he would eat close to nothing daily mainly because he did not have any appetite. However, though he was not eating meals, he WAS drinking 2-3 Ensure supplements a day which would equate to ~1000 kcals.   He reports having 4-6 watery BMs per day.   He states his UBW is 235 lbs though there is no documentation indicating this severe of a loss. EMR indicates much less significant of a wt loss of 6-7 lbs in last 1-2 months  RD went over ORS and how these can be used to help prevent the severe dehydration he had experienced.  He still has no appetite and experiences early satiety, likely due to going so long w/o substantial po intake. Was agreeable to Ensure TID. He said he could eat ice cream and apples sauce, but that is all he had an appetite for.   NFPE: Mild orbital wasting Labs reviewed: Renal labs improving Medications: LOmotil, folate+ thiamin, , mvi with min, probiotic.    Recent Labs Lab 10/12/15 0538 10/12/15 1638 10/13/15 0255  NA 137 139 140  K 2.8* 3.1* 3.1*  CL 98* 112* 115*  CO2 22 17* 19*   BUN 31* 27* 24*  CREATININE 6.39* 4.75* 3.54*  CALCIUM 8.7* 7.1* 6.8*  MG 1.8  --   --   GLUCOSE 115* 100* 91   Diet Order:  Diet Heart Room service appropriate? Yes; Fluid consistency: Thin  Skin:  Reviewed, no issues  Last BM:  8/25-diarrhea  Height:  Ht Readings from Last 1 Encounters:  10/12/15 6\' 1"  (1.854 m)   Weight:  Wt Readings from Last 1 Encounters:  10/13/15 220 lb 3.8 oz (99.9 kg)   Wt Readings from Last 10 Encounters:  10/13/15 220 lb 3.8 oz (99.9 kg)  09/18/15 227 lb 15.3 oz (103.4 kg)  07/25/15 226 lb (102.5 kg)  11/06/14 227 lb 3.2 oz (103.1 kg)  03/14/14 222 lb 12.8 oz (101.1 kg)  03/06/14 231 lb (104.8 kg)  02/13/14 218 lb 12.8 oz (99.2 kg)  10/13/13 228 lb 9.6 oz (103.7 kg)  09/28/13 224 lb 1.6 oz (101.7 kg)  08/22/13 234 lb (106.1 kg)   Ideal Body Weight:  83.64 kg  BMI:  Body mass index is 29.06 kg/m.  Estimated Nutritional Needs:  Kcal:  2000-2200 (20-22 kcals/kg bw) Protein:  85-100 g Pro (1-1.2 g/kg ibw) Fluid:  >2.2 liters+ enough to replace stool losses  EDUCATION NEEDS:  No education needs identified at this time  Christophe Louis RD, LDN, CNSC Clinical Nutrition Pager: [phone] 10/13/2015 3:09 PM

## 2015-10-13 NOTE — Progress Notes (Signed)
Patient states that his diarrhea is "about 50% better."  He is on IV Cipro and Flagyl, but his stool pathogen panel came back entirely negative.  He has not yet submitted a stool for C. difficile (I have ordered that).  Tissue transglutaminase is pending.  CT scan is negative for colitis or secretory tumors or other cause for diarrhea.   Impression: Severe diarrhea of unclear cause. Given his previous biopsy evidence of collagenous colitis, I think that there is a good chance that he has had activation of that condition, which previously never required treatment.  Recommendations:  1. I would consider stopping Cipro and Flagyl, even though the patient has gotten better while on those medications. We have no documented infection that would benefit from those medications, which themselves would place him at risk for antibiotic associated diarrhea and even C. difficile infection.  2. I will start probiotic therapy with Saccharomyces Bullardii  3. If Clostridium difficile test comes back negative, we will consider sigmoidoscopy or possibly even colonoscopy on Monday, with the intent of obtaining updated biopsies to check the status of his collagenous colitis.  Florencia Reasons, M.D. Pager [phone] If no answer or after 5 PM call [phone]

## 2015-10-14 ENCOUNTER — Encounter (HOSPITAL_COMMUNITY): Payer: Self-pay

## 2015-10-14 DIAGNOSIS — I5042 Chronic combined systolic (congestive) and diastolic (congestive) heart failure: Secondary | ICD-10-CM | POA: Diagnosis not present

## 2015-10-14 DIAGNOSIS — N170 Acute kidney failure with tubular necrosis: Secondary | ICD-10-CM | POA: Diagnosis not present

## 2015-10-14 DIAGNOSIS — Z95810 Presence of automatic (implantable) cardiac defibrillator: Secondary | ICD-10-CM | POA: Diagnosis not present

## 2015-10-14 DIAGNOSIS — K591 Functional diarrhea: Secondary | ICD-10-CM | POA: Diagnosis not present

## 2015-10-14 DIAGNOSIS — E860 Dehydration: Secondary | ICD-10-CM | POA: Diagnosis not present

## 2015-10-14 DIAGNOSIS — K529 Noninfective gastroenteritis and colitis, unspecified: Secondary | ICD-10-CM | POA: Diagnosis not present

## 2015-10-14 LAB — RENAL FUNCTION PANEL
ALBUMIN: 2.3 g/dL — AB (ref 3.5–5.0)
ANION GAP: 4 — AB (ref 5–15)
BUN: 16 mg/dL (ref 6–20)
CALCIUM: 7.5 mg/dL — AB (ref 8.9–10.3)
CO2: 19 mmol/L — AB (ref 22–32)
CREATININE: 1.25 mg/dL — AB (ref 0.61–1.24)
Chloride: 120 mmol/L — ABNORMAL HIGH (ref 101–111)
GFR calc Af Amer: >60 mL/min (ref >60)
GFR calc non Af Amer: 57 mL/min — ABNORMAL LOW (ref >60)
GLUCOSE: 84 mg/dL (ref 65–99)
PHOSPHORUS: 2.9 mg/dL (ref 2.5–4.6)
Potassium: 3.8 mmol/L (ref 3.5–5.1)
SODIUM: 143 mmol/L (ref 135–145)

## 2015-10-14 LAB — C DIFFICILE QUICK SCREEN W PCR REFLEX
C DIFFICLE (CDIFF) ANTIGEN: NEGATIVE
C Diff interpretation: NOT DETECTED
C Diff toxin: NEGATIVE

## 2015-10-14 LAB — CBC
HEMATOCRIT: 32 % — AB (ref 39.0–52.0)
HEMOGLOBIN: 10.5 g/dL — AB (ref 13.0–17.0)
MCH: 31 pg (ref 26.0–34.0)
MCHC: 32.8 g/dL (ref 30.0–36.0)
MCV: 94.4 fL (ref 78.0–100.0)
Platelets: 104 10*3/uL — ABNORMAL LOW (ref 150–400)
RBC: 3.39 MIL/uL — ABNORMAL LOW (ref 4.22–5.81)
RDW: 14 % (ref 11.5–15.5)
WBC: 4 10*3/uL (ref 4.0–10.5)

## 2015-10-14 LAB — TSH: TSH: 0.083 u[IU]/mL — AB (ref 0.350–4.500)

## 2015-10-14 LAB — FOLATE: FOLATE: 16.5 ng/mL (ref >5.9)

## 2015-10-14 LAB — IRON AND TIBC
Iron: 107 ug/dL (ref 45–182)
Saturation Ratios: 61 % — ABNORMAL HIGH (ref 17.9–39.5)
TIBC: 176 ug/dL — ABNORMAL LOW (ref 250–450)
UIBC: 69 ug/dL

## 2015-10-14 LAB — MAGNESIUM: MAGNESIUM: 1.4 mg/dL — AB (ref 1.7–2.4)

## 2015-10-14 LAB — VITAMIN B12: Vitamin B-12: 396 pg/mL (ref 180–914)

## 2015-10-14 LAB — FERRITIN: Ferritin: 406 ng/mL — ABNORMAL HIGH (ref 24–336)

## 2015-10-14 MED ORDER — ZOLPIDEM TARTRATE 5 MG PO TABS
5.0000 mg | ORAL_TABLET | Freq: Every evening | ORAL | Status: DC | PRN
Start: 2015-10-14 — End: 2015-10-15
  Administered 2015-10-14: 5 mg via ORAL
  Filled 2015-10-14: qty 1

## 2015-10-14 MED ORDER — DIPHENOXYLATE-ATROPINE 2.5-0.025 MG PO TABS
1.0000 | ORAL_TABLET | Freq: Three times a day (TID) | ORAL | Status: DC
  Administered 2015-10-14 (×2): 1 via ORAL
  Filled 2015-10-14 (×3): qty 1

## 2015-10-14 MED ORDER — CARVEDILOL 3.125 MG PO TABS
3.1250 mg | ORAL_TABLET | Freq: Two times a day (BID) | ORAL | Status: DC
  Administered 2015-10-14 – 2015-10-15 (×3): 3.125 mg via ORAL
  Filled 2015-10-14 (×3): qty 1

## 2015-10-14 MED ORDER — MAGNESIUM SULFATE 4 GM/100ML IV SOLN
4.0000 g | Freq: Once | INTRAVENOUS | Status: AC
  Administered 2015-10-14: 4 g via INTRAVENOUS
  Filled 2015-10-14: qty 100

## 2015-10-14 NOTE — Progress Notes (Addendum)
PROGRESS NOTE  Don [name]  QDI:[id number] DOB: [birth year] DOA: 10/12/2015 PCP: [name], MD  Brief Narrative:    Don [name] is a 70 y.o. male with history of nonischemic cardio myopathy, chronic systolic heart failure, left bundle branch block, biventricular ICD, and chronic kidney disease who presented to the emergency department with severe weakness and chronic watery, nonbloody diarrhea. No associated abdominal pain, he has had intermittent nausea without vomiting . He passed out 3 times at home yesterday with preceding dizziness. Vision has not been taking much by mouth the last 2 months, his diarrhea is not dependent on oral intake, he wakes up in the middle of the night with watery stools.  No antibiotics or medication changes in the last few months except for addition of Plavix. Prior to 2 months ago patient's bowel movements were solid, once daily. No fevers at home but patient will continue wet from sweating. His appetite is poor.    Assessment & Plan:   Active Problems:   Biventricular implantable cardioverter-defibrillator in situ   Chronic combined systolic (congestive) and diastolic (congestive) heart failure (HCC)   Acute renal failure (HCC)   Hypovolemic shock (HCC)   Diarrhea   Fall   Dehydration   History of stroke   HLD (hyperlipidemia)   Sepsis (HCC)   Pain in scapula   Chronic diarrhea  Chronic/subacute diarrhea with associated severe dehydration, AKI and weight loss.  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  Doubt sepsis given lack of fever, leukocytosis.  Vital signs and AKI likely reflective of dehydration.  Suspicious for pancreatic insufficiency given EtOH abuse - CT abd/pelvis:  2cm stable mass next to the right kidney and stable mesenteric adenopathy (see below) - Check serotonin level - Stool pathogen PCR negative -  F/u tissue transglutaminase -  Urine 5-HIAA pending - appreciate GI assistance -  Increase  to TID lomotil (seems to be helping) -  Agree with florastor -  Per patient, possible sigmoidoscopy tomorrow  AKI secondary to ATN from severe chronic dehydration and ACEI and NSAID use.  Baseline creatinine 0.6-0.7.  Creatinine 6.39 on admission.  Trending down - hold home ACEI, avoid nephrotoxic medications.  - continue IVF  Syncope due to orthostasis and dehydration, overall feeling better -  Tele:  biv paced.  NSVT, asymptomatic.  Has ICD -  Correcting electrolytes -  Continue BB -  Okay to d/c telemetry  EtOH abuse, claims that he stopped drinking several weeks prior to admission, no signs of ETOH withdrawal -  Continue CIWA protocol, thiamine, folate, MVI  Right scapular pain post falls at home.  No evidence of fracture on scapula and chest x-ray.  Hypokalemia, secondary to diarrhea, improving -  Continue potassium in IVF  Hypomagnesemia -  Additional magnesium IV 4gm  Hx of CVA. Started on Plavix in early August.  . -continue home plavix  Retroperitoneal mass 2cm, and mesenteric lymphadenopathy -  Followed by cancer center.  Felt to be benign given stability over many years.  Hypothyroidism, hx of graves s/p ablation.  TSH 0.137 > may be contributing to his diarrhea  - reduce home synthroid to daily  Ischemic cardiomyopathy / chronic combined systolic and diastolic heart failure /  BiVentricular defibrillator in place.  EF 40% -decrease coreg slightly due to hypotension  Hyperlipidemia -continue home statin  Gout.  resume home allopurinol 100mg  but decrease to QOD for now given AKI  Normocytic anemia, likely due to ATN/AKI.  Iron, B12, folate wnl.  Alternatively be related to thyroid abnormalities or chronic disease.  Thrombocytopenia, likely due to hemodilution and acute illness.  Pharmacist called concerned for HIT.  4T score of 3 (low risk).  Would normally not test unless platelet count decrease > 50%, however, since this is borderline, will change  to SCDs and send antiplatelet antibody. -  SCDs -  HIT ab                                     DVT prophylaxis:    SCDs  Code Status:     Full code   Family Communication:    patient alone Disposition Plan:   Discharge possibly tomorrow.  Transfer to med-surg   Consultants:   Dr. Randa Evens, Gastroenterology  Procedures:  none  Antimicrobials:   Cipro/flagyl on 8/25 > 8/26   Subjective:  Feeling much better, but still feels weak and tired. Denies shortness of breath, chest pains, abdominal pains.  Stools are still frequent, but becoming more formed;   Objective: Vitals:   10/14/15 0100 10/14/15 0420 10/14/15 0500 10/14/15 0932  BP: 102/69 95/76  104/73  Pulse: 81 88  76  Resp: (!) 21 18  (!) 24  Temp:  98.2 F (36.8 C)  98.2 F (36.8 C)  TempSrc:  Oral  Oral  SpO2: 97% 99%  97%  Weight:   101.1 kg (222 lb 14.2 oz)   Height:        Intake/Output Summary (Last 24 hours) at 10/14/15 1101 Last [name] filed at 10/14/15 0932  Gross per 24 hour  Intake          2461.67 ml  Output              800 ml  Net          1661.67 ml   Filed Weights   10/12/15 0536 10/13/15 0339 10/14/15 0500  Weight: 99.8 kg (220 lb) 99.9 kg (220 lb 3.8 oz) 101.1 kg (222 lb 14.2 oz)    Examination:  General exam:  Adult male.  No acute distress.   HEENT:  NCAT, MMM, no tongue fasciculations Respiratory system: Clear to auscultation bilaterally Cardiovascular system: Regular rate and rhythm, normal S1/S2. 2/6 systolic murmur at the LSB.  Warm extremities Gastrointestinal system: Normal active bowel sounds, soft, nondistended, nontender. MSK:  Normal tone and bulk, no lower extremity edema Neuro:  Grossly intact.  No obvious tremors.    [name] Reviewed: I have personally reviewed following labs and imaging studies  CBC:  Recent Labs Lab 10/12/15 0538 10/13/15 0255 10/14/15 0456  WBC 14.0* 6.0 4.0  NEUTROABS 10.6*  --   --   HGB 14.6 11.4* 10.5*  HCT 43.3 35.5* 32.0*  MCV 92.9 94.9  94.4  PLT 198 122* 104*   Basic Metabolic Panel:  Recent Labs Lab 10/12/15 0538 10/12/15 1638 10/13/15 0255 10/14/15 0456  NA 137 139 140 143  K 2.8* 3.1* 3.1* 3.8  CL 98* 112* 115* 120*  CO2 22 17* 19* 19*  GLUCOSE 115* 100* 91 84  BUN 31* 27* 24* 16  CREATININE 6.39* 4.75* 3.54* 1.25*  CALCIUM 8.7* 7.1* 6.8* 7.5*  MG 1.8  --   --  1.4*  PHOS  --   --   --  2.9   GFR: Estimated Creatinine Clearance: 69.7 mL/min (by C-G formula based on SCr of 1.25 mg/dL). Liver Function Tests:  Recent Labs Lab  10/12/15 0538 10/14/15 0456  AST 28  --   ALT 21  --   ALKPHOS 60  --   BILITOT 0.6  --   PROT 6.8  --   ALBUMIN 3.3* 2.3*    Recent Labs Lab 10/12/15 0538  LIPASE 22   No results for input(s): AMMONIA in the last 168 hours. Coagulation Profile:  Recent Labs Lab 10/12/15 1000  INR 1.09   Cardiac Enzymes:  Recent Labs Lab 10/12/15 1000 10/12/15 1358  TROPONINI <0.03 <0.03   BNP (last 3 results) No results for input(s): PROBNP in the last 8760 hours. HbA1C: No results for input(s): HGBA1C in the last 72 hours. CBG:  Recent Labs Lab 10/13/15 1202  GLUCAP 90   Lipid Profile: No results for input(s): CHOL, HDL, LDLCALC, TRIG, CHOLHDL, LDLDIRECT in the last 72 hours. Thyroid Function Tests:  Recent Labs  10/14/15 0806  TSH 0.083*   Anemia Panel:  Recent Labs  10/14/15 0806  VITAMINB12 396  FOLATE 16.5  FERRITIN 406*  TIBC 176*  IRON 107   Urine analysis:    Component Value Date/Time   COLORURINE YELLOW 10/12/2015 2143   APPEARANCEUR CLEAR 10/12/2015 2143   LABSPEC 1.017 10/12/2015 2143   PHURINE 5.5 10/12/2015 2143   GLUCOSEU NEGATIVE 10/12/2015 2143   HGBUR NEGATIVE 10/12/2015 2143   BILIRUBINUR NEGATIVE 10/12/2015 2143   KETONESUR NEGATIVE 10/12/2015 2143   PROTEINUR NEGATIVE 10/12/2015 2143   UROBILINOGEN 1.0 09/04/2014 1554   NITRITE NEGATIVE 10/12/2015 2143   LEUKOCYTESUR NEGATIVE 10/12/2015 2143   Sepsis  Labs: @LABRCNTIP (procalcitonin:4,lacticidven:4)  ) Recent Results (from the past 240 hour(s))  Culture, blood (x 2)     Status: None (Preliminary result)   Collection Time: 10/12/15  9:45 AM  Result Value Ref Range Status   Specimen Description BLOOD LEFT HAND  Final   Special Requests BOTTLES DRAWN AEROBIC AND ANAEROBIC  5CC  Final   Culture NO GROWTH 1 DAY  Final   Report Status PENDING  Incomplete  Culture, blood (x 2)     Status: None (Preliminary result)   Collection Time: 10/12/15 10:00 AM  Result Value Ref Range Status   Specimen Description BLOOD RIGHT ARM  Final   Special Requests BOTTLES DRAWN AEROBIC AND ANAEROBIC  5CC  Final   Culture NO GROWTH 1 DAY  Final   Report Status PENDING  Incomplete  MRSA PCR Screening     Status: None   Collection Time: 10/12/15 11:39 AM  Result Value Ref Range Status   MRSA by PCR NEGATIVE NEGATIVE Final    Comment:        The GeneXpert MRSA Assay (FDA approved for NASAL specimens only), is one component of a comprehensive MRSA colonization surveillance program. It is not intended to diagnose MRSA infection nor to guide or monitor treatment for MRSA infections.   Gastrointestinal Panel by PCR , Stool     Status: None   Collection Time: 10/12/15 11:52 AM  Result Value Ref Range Status   Campylobacter species NOT DETECTED NOT DETECTED Final   Plesimonas shigelloides NOT DETECTED NOT DETECTED Final   Salmonella species NOT DETECTED NOT DETECTED Final   Yersinia enterocolitica NOT DETECTED NOT DETECTED Final   Vibrio species NOT DETECTED NOT DETECTED Final   Vibrio cholerae NOT DETECTED NOT DETECTED Final   Enteroaggregative E coli (EAEC) NOT DETECTED NOT DETECTED Final   Enteropathogenic E coli (EPEC) NOT DETECTED NOT DETECTED Final   Enterotoxigenic E coli (ETEC) NOT DETECTED NOT DETECTED Final  Shiga like toxin producing E coli (STEC) NOT DETECTED NOT DETECTED Final   E. coli O157 NOT DETECTED NOT DETECTED Final    Shigella/Enteroinvasive E coli (EIEC) NOT DETECTED NOT DETECTED Final   Cryptosporidium NOT DETECTED NOT DETECTED Final   Cyclospora cayetanensis NOT DETECTED NOT DETECTED Final   Entamoeba histolytica NOT DETECTED NOT DETECTED Final   Giardia lamblia NOT DETECTED NOT DETECTED Final   Adenovirus F40/41 NOT DETECTED NOT DETECTED Final   Astrovirus NOT DETECTED NOT DETECTED Final   Norovirus GI/GII NOT DETECTED NOT DETECTED Final   Rotavirus A NOT DETECTED NOT DETECTED Final   Sapovirus (I, II, IV, and V) NOT DETECTED NOT DETECTED Final  C difficile quick scan w PCR reflex     Status: None   Collection Time: 10/13/15  9:37 PM  Result Value Ref Range Status   C Diff antigen NEGATIVE NEGATIVE Final   C Diff toxin NEGATIVE NEGATIVE Final   C Diff interpretation No C. difficile detected.  Final      Radiology Studies: No results found.   Scheduled Meds: . allopurinol  100 mg Oral QODAY  . aspirin EC  81 mg Oral Daily  . atorvastatin  40 mg Oral Daily  . carvedilol  3.125 mg Oral BID WC  . clopidogrel  75 mg Oral Daily  . diphenoxylate-atropine  1 tablet Oral BID  . feeding supplement (ENSURE ENLIVE)  237 mL Oral TID BM  . folic acid  1 mg Oral Daily  . heparin  5,000 Units Subcutaneous Q8H  . levothyroxine  50 mcg Oral QAC breakfast  . LORazepam  0-4 mg Intravenous Q6H   Followed by  . [START ON 10/15/2015] LORazepam  0-4 mg Intravenous Q12H  . multivitamin with minerals  1 tablet Oral Daily  . saccharomyces boulardii  250 mg Oral BID  . sodium chloride flush  3 mL Intravenous Q12H  . thiamine  100 mg Oral Daily   Continuous Infusions: . 0.9 % NaCl with KCl 40 mEq / L 100 mL/hr (10/14/15 0756)     LOS: 2 days    Time spent: 30 min    Renae FickleSHORT, Ithiel Liebler, MD [facility] Pager [phone]  If 7PM-7AM, please contact night-coverage [email or web address] Password [email or web address] 10/14/2015, 11:01 AM

## 2015-10-14 NOTE — Progress Notes (Signed)
Patient states that his bowel movements are doing better. 3 or 4 bowel movements, by his estimate, in the past 24 hours, becoming semi-formed.  C. difficile toxin negative.  Tissue transglutaminase pending. 24 hour urine for 5 HIAA in process.  Patient is off antibiotics, on probiotics, and on Lomotil. He is on a low-fat diet.  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 Exocrine pancreatic insufficiency is another possibility, but I think is less likely.  Plan:  1. Stool for qualitative fecal fat 2. Reassess tomorrow morning. Depending on results of pending studies and clinical evolution, could consider unprepped sigmoidoscopy with a few biopsies, without sedation (patient on SQ heparin, Plavix, and aspirin). Alternatively, patient could be considered for full, prepped colonoscopy, ideally after being off Plavix in anticipation of multiple random mucosal biopsies. This might be able to be done as an outpatient. 3.  Dr. Dulce Sellar will assess the patient tomorrow.  Florencia Reasons, M.D. Pager [phone] If no answer or after 5 PM call [phone]

## 2015-10-14 NOTE — Evaluation (Signed)
Physical Therapy Evaluation Patient Details Name: Don [name] MRN: [id number] DOB: 08-Dec-1945 Today's Date: 10/14/2015   History of Present Illness  Patient is a 70 yo male admitted 10/12/15 with weakness, diarrhea, dehydration, AKI, syncope x3 with falls.    PMH:  ETOH use, NICM, CHF, LBBB, ICD, CKD, CVA, Gout    Clinical Impression  Patient presents with problems listed below.  Will benefit from acute PT to maximize functional mobility prior to d/c home with daughter.  Recommend HHPT for continued therapy at d/c.    Follow Up Recommendations Home health PT;Supervision for mobility/OOB    Equipment Recommendations  None recommended by PT    Recommendations for Other Services       Precautions / Restrictions Precautions Precautions: Fall Precaution Comments: Syncope with fall x3 on day pta;  Hit Rt shoulder and Rt side of head; Lt shoulder; and back of head during these falls Restrictions Weight Bearing Restrictions: No      Mobility  Bed Mobility Overal bed mobility: Needs Assistance Bed Mobility: Supine to Sit     Supine to sit: Min guard;HOB elevated     General bed mobility comments: Assist for safety only.  Transfers Overall transfer level: Needs assistance Equipment used: Rolling walker (2 wheeled) Transfers: Sit to/from Stand Sit to Stand: Min assist;+2 safety/equipment         General transfer comment: Verbal cues for hand placement.  Assist to steady during transfers.  Ambulation/Gait Ambulation/Gait assistance: Min assist;+2 safety/equipment Ambulation Distance (Feet): 86 Feet Assistive device: Rolling walker (2 wheeled) Gait Pattern/deviations: Step-through pattern;Decreased stride length;Staggering right;Trunk flexed Gait velocity: decreased Gait velocity interpretation: Below normal speed for age/gender General Gait Details: Verbal cues for safe use of RW.  Patient unsteady, with loss of balance to right side, requiring assist to prevent  fall.  On one occasion, Rt knee buckled and patient able to regain balance.    Stairs            Wheelchair Mobility    Modified Rankin (Stroke Patients Only)       Balance Overall balance assessment: Needs assistance;History of Falls         Standing balance support: Bilateral upper extremity supported Standing balance-Leahy Scale: Poor                               Pertinent Vitals/Pain Pain Assessment: 0-10 Pain Score: 4  Pain Location: Rt shoulder Pain Descriptors / Indicators: Sore Pain Intervention(s): Monitored during session;Repositioned    Home Living Family/patient expects to be discharged to:: Private residence Living Arrangements: Children (Daughter) Available Help at Discharge: Family;Available 24 hours/day Type of Home: House (Townhouse) Home Access: Stairs to enter Entrance Stairs-Rails: Can reach both;Left;Right Entrance Stairs-Number of Steps: 6 Home Layout: Multi-level;Bed/bath upstairs;1/2 bath on main level Home Equipment: Walker - 2 wheels;Cane - single point      Prior Function Level of Independence: Independent               Hand Dominance   Dominant Hand: Right    Extremity/Trunk Assessment   Upper Extremity Assessment: Overall WFL for tasks assessed           Lower Extremity Assessment: Generalized weakness         Communication   Communication: No difficulties  Cognition Arousal/Alertness: Awake/alert Behavior During Therapy: WFL for tasks assessed/performed;Restless Overall Cognitive Status: Within Functional Limits for tasks assessed (Decreased safety awareness-? baseline)  General Comments      Exercises        Assessment/Plan    PT Assessment Patient needs continued PT services  PT Diagnosis Difficulty walking;Generalized weakness;Acute pain   PT Problem List Decreased strength;Decreased activity tolerance;Decreased balance;Decreased mobility;Decreased  knowledge of use of DME;Decreased safety awareness;Cardiopulmonary status limiting activity;Pain  PT Treatment Interventions DME instruction;Gait training;Stair training;Functional mobility training;Therapeutic activities;Therapeutic exercise;Patient/family education   PT Goals (Current goals can be found in the Care Plan section) Acute Rehab PT Goals Patient Stated Goal: To get stronger PT Goal Formulation: With patient Time For Goal Achievement: 10/21/15 Potential to Achieve Goals: Good    Frequency Min 3X/week   Barriers to discharge Inaccessible home environment 6-8 steps to enter home.  Bedroom upstairs    Co-evaluation               End of Session Equipment Utilized During Treatment: Gait belt Activity Tolerance: Patient tolerated treatment well;Patient limited by fatigue Patient left: in chair;with call bell/phone within reach;with chair alarm set;with family/visitor present Nurse Communication: Mobility status         Time: [id number] PT Time Calculation (min) (ACUTE ONLY): 21 min   Charges:   PT Evaluation $PT Eval High Complexity: 1 Procedure     PT G CodesVena Austria:        Ethen Bannan H 10/14/2015, 6:06 PM Durenda HurtSusan H. Renaldo Fiddleravis, PT, [facility] LPMBA Acute Rehab Services Pager [phone]

## 2015-10-15 ENCOUNTER — Other Ambulatory Visit: Payer: Self-pay | Admitting: *Deleted

## 2015-10-15 DIAGNOSIS — D696 Thrombocytopenia, unspecified: Secondary | ICD-10-CM

## 2015-10-15 DIAGNOSIS — R571 Hypovolemic shock: Secondary | ICD-10-CM

## 2015-10-15 DIAGNOSIS — R197 Diarrhea, unspecified: Secondary | ICD-10-CM | POA: Diagnosis not present

## 2015-10-15 DIAGNOSIS — I5042 Chronic combined systolic (congestive) and diastolic (congestive) heart failure: Secondary | ICD-10-CM | POA: Diagnosis not present

## 2015-10-15 DIAGNOSIS — K52831 Collagenous colitis: Secondary | ICD-10-CM | POA: Diagnosis not present

## 2015-10-15 DIAGNOSIS — K529 Noninfective gastroenteritis and colitis, unspecified: Secondary | ICD-10-CM | POA: Diagnosis not present

## 2015-10-15 DIAGNOSIS — N170 Acute kidney failure with tubular necrosis: Secondary | ICD-10-CM | POA: Diagnosis not present

## 2015-10-15 DIAGNOSIS — E860 Dehydration: Secondary | ICD-10-CM | POA: Diagnosis not present

## 2015-10-15 DIAGNOSIS — Z95810 Presence of automatic (implantable) cardiac defibrillator: Secondary | ICD-10-CM | POA: Diagnosis not present

## 2015-10-15 LAB — MAGNESIUM: Magnesium: 1.7 mg/dL (ref 1.7–2.4)

## 2015-10-15 LAB — CBC
HEMATOCRIT: 34 % — AB (ref 39.0–52.0)
Hemoglobin: 10.9 g/dL — ABNORMAL LOW (ref 13.0–17.0)
MCH: 30.7 pg (ref 26.0–34.0)
MCHC: 32.1 g/dL (ref 30.0–36.0)
MCV: 95.8 fL (ref 78.0–100.0)
PLATELETS: 117 10*3/uL — AB (ref 150–400)
RBC: 3.55 MIL/uL — ABNORMAL LOW (ref 4.22–5.81)
RDW: 14.1 % (ref 11.5–15.5)
WBC: 4.4 10*3/uL (ref 4.0–10.5)

## 2015-10-15 LAB — RENAL FUNCTION PANEL
ANION GAP: 3 — AB (ref 5–15)
Albumin: 2.3 g/dL — ABNORMAL LOW (ref 3.5–5.0)
BUN: 10 mg/dL (ref 6–20)
CALCIUM: 7.7 mg/dL — AB (ref 8.9–10.3)
CO2: 21 mmol/L — AB (ref 22–32)
Chloride: 117 mmol/L — ABNORMAL HIGH (ref 101–111)
Creatinine, Ser: 0.83 mg/dL (ref 0.61–1.24)
GFR calc Af Amer: >60 mL/min (ref >60)
GFR calc non Af Amer: >60 mL/min (ref >60)
GLUCOSE: 102 mg/dL — AB (ref 65–99)
POTASSIUM: 4.4 mmol/L (ref 3.5–5.1)
Phosphorus: 2.2 mg/dL — ABNORMAL LOW (ref 2.5–4.6)
SODIUM: 141 mmol/L (ref 135–145)

## 2015-10-15 MED ORDER — NONFORMULARY OR COMPOUNDED ITEM: 0 refills | Status: DC

## 2015-10-15 MED ORDER — LEVOTHYROXINE SODIUM 50 MCG PO TABS: 50.0000 ug | ORAL_TABLET | Freq: Every day | ORAL | 0 refills | Status: DC

## 2015-10-15 MED ORDER — BUDESONIDE 3 MG PO CPEP: 9.0000 mg | ORAL_CAPSULE | Freq: Every day | ORAL | 0 refills | Status: DC

## 2015-10-15 MED ORDER — BUDESONIDE 3 MG PO CPEP
9.0000 mg | ORAL_CAPSULE | Freq: Every day | ORAL | Status: DC
  Administered 2015-10-15: 9 mg via ORAL
  Filled 2015-10-15: qty 3

## 2015-10-15 MED ORDER — SACCHAROMYCES BOULARDII 250 MG PO CAPS: 250.0000 mg | ORAL_CAPSULE | Freq: Two times a day (BID) | ORAL | 3 refills | Status: DC

## 2015-10-15 NOTE — Progress Notes (Signed)
Erling Conte to be D/C'd Home per MD order.  Discussed prescriptions and follow up appointments with the patient. Prescriptions given to patient, medication list explained in detail. Pt verbalized understanding.    Medication List    STOP taking these medications   benazepril 10 MG tablet Commonly known as:  LOTENSIN   furosemide 20 MG tablet Commonly known as:  LASIX   potassium chloride 10 MEQ tablet Commonly known as:  K-DUR     TAKE these medications   allopurinol 100 MG tablet Commonly known as:  ZYLOPRIM Take 100 mg by mouth daily.   aspirin 81 MG tablet Take 1 tablet (81 mg total) by mouth daily.   atorvastatin 40 MG tablet Commonly known as:  LIPITOR Take 40 mg by mouth daily.   budesonide 3 MG 24 hr capsule Commonly known as:  ENTOCORT EC Take 3 capsules (9 mg total) by mouth daily.   calcium carbonate 500 MG chewable tablet Commonly known as:  TUMS - dosed in mg elemental calcium Chew 1 tablet by mouth daily.   carvedilol 6.25 MG tablet Commonly known as:  COREG Take 6.25 mg by mouth 2 (two) times daily with a meal.   Cholecalciferol 4000 units Caps Take 1 capsule by mouth daily.   clopidogrel 75 MG tablet Commonly known as:  PLAVIX Take 1 tablet (75 mg total) by mouth daily.   levothyroxine 50 MCG tablet Commonly known as:  SYNTHROID, LEVOTHROID Take 1 tablet (50 mcg total) by mouth daily before breakfast. What changed:  medication strength  how much to take   nitroGLYCERIN 0.4 MG SL tablet Commonly known as:  NITROSTAT Place 0.4 mg under the tongue every 5 (five) minutes as needed for chest pain.   NONFORMULARY OR COMPOUNDED ITEM Outpatient PT, eval and treat Indication:  Syncope.  Orthostatic hypotension.  Generalized weakness.   OVER THE COUNTER MEDICATION Take 1 tablet by mouth 2 (two) times daily. Multi-allergy tablet   saccharomyces boulardii 250 MG capsule Commonly known as:  FLORASTOR Take 1 capsule (250 mg total) by mouth  2 (two) times daily.       Vitals:   10/15/15 0512 10/15/15 0922  BP: (!) 156/99 (!) 141/89  Pulse: 79 69  Resp: (!) 22 20  Temp: 97.8 F (36.6 C) 97.9 F (36.6 C)    Skin clean, dry and intact without evidence of skin break down, no evidence of skin tears noted. IV catheter discontinued intact. Site without signs and symptoms of complications. Dressing and pressure applied. Pt denies pain at this time. No complaints noted.  An After Visit Summary was printed and given to the patient. Patient escorted via WC, and D/C home via private auto.  Janeann Forehand BSN, RN

## 2015-10-15 NOTE — Consult Note (Signed)
   [facility] CM Inpatient Consult   10/15/2015  DENT KELNER 01-Oct-1945 [id number]    Patient screened for long-term disease management services with [facility] Care Management program on behalf of his [employer] insurance. Went to bedside to discuss and offer [facility] Care Management services. Mr. Maddex is agreeable and written consent signed. Explained to patient that he will receive post hospital transition of care calls and will be evaluated for monthly home visits. Confirmed Primary Care MD as Dr. Zachery Dauer. However, he states he is soon transitioning to a new Primary Care MD near his house. States his appointment was scheduled for today but was canceled because he was in the hospital. Mr. Hinrichsen states he will call and schedule another appointment. New MD practice he states will be with [facility]. Because he has [employer] he will still be eligible for [facility] Care Management services.  Confirmed best contact number as [phone]. Mr. Ingham endorses that his wife is deceased and his youngest daughter lives with him for now. States one of his sons lives a couple of miles away from him. Denies having any concerns with medications or with transportation. However, he states he will appreciate follow up regarding CHF. Left [facility] Care Management packet and contact information at bedside. Made inpatient RNCM aware that patient will be followed by [facility] Care Management post hospital discharge.  Will request for Mr. Pinkhasov to be assign to [facility] RNCM for transition of care. Mr. Bianca has had x2 admits in past 6 months. Has history of nonischemic cardiomyopathy, CHF, left bundle branch block, biventricular ICD, chronic kidney disease.  He states he is scheduled to discharge home today.   Raiford Noble, MSN-Ed, RN,BSN [facility] Liaison [phone]

## 2015-10-15 NOTE — Discharge Instructions (Signed)
Acute Kidney Injury °Acute kidney injury is any condition in which there is sudden (acute) damage to the kidneys. Acute kidney injury was previously known as acute kidney failure or acute renal failure. The kidneys are two organs that lie on either side of the spine between the middle of the back and the front of the abdomen. The kidneys: °· Remove wastes and extra water from the blood.   °· Produce important hormones. These help keep bones strong, regulate blood pressure, and help create red blood cells.   °· Balance the fluids and chemicals in the blood and tissues. °A small amount of kidney damage may not cause problems, but a large amount of damage may make it difficult or impossible for the kidneys to work the way they should. Acute kidney injury may develop into long-lasting (chronic) kidney disease. It may also develop into a life-threatening disease called end-stage kidney disease. Acute kidney injury can get worse very quickly, so it should be treated right away. Early treatment may prevent other kidney diseases from developing. °CAUSES  °· A problem with blood flow to the kidneys. This may be caused by:   °¨ Blood loss.   °¨ Heart disease.   °¨ Severe burns.   °¨ Liver disease. °· Direct damage to the kidneys. This may be caused by: °¨ Some medicines.   °¨ A kidney infection.   °¨ Poisoning or consuming toxic substances.   °¨ A surgical wound.   °¨ A blow to the kidney area.   °· A problem with urine flow. This may be caused by:   °¨ Cancer.   °¨ Kidney stones.   °¨ An enlarged prostate. °SIGNS AND SYMPTOMS  °· Swelling (edema) of the legs, ankles, or feet.   °· Tiredness (lethargy).   °· Nausea or vomiting.   °· Confusion.   °· Problems with urination, such as:   °¨ Painful or burning feeling during urination.   °¨ Decreased urine production.   °¨ Frequent accidents in children who are potty trained.   °¨ Bloody urine.   °· Muscle twitches and cramps.   °· Shortness of breath.   °· Seizures.   °· Chest  pain or pressure. °Sometimes, no symptoms are present.  °DIAGNOSIS °Acute kidney injury may be detected and diagnosed by tests, including blood, urine, imaging, or kidney biopsy tests.  °TREATMENT °Treatment of acute kidney injury varies depending on the cause and severity of the kidney damage. In mild cases, no treatment may be needed. The kidneys may heal on their own. If acute kidney injury is more severe, your health care provider will treat the cause of the kidney damage, help the kidneys heal, and prevent complications from occurring. Severe cases may require a procedure to remove toxic wastes from the body (dialysis) or surgery to repair kidney damage. Surgery may involve:  °· Repair of a torn kidney.   °· Removal of an obstruction. °HOME CARE INSTRUCTIONS °· Follow your prescribed diet. °· Take medicines only as directed by your health care provider.  °· Do not take any new medicines (prescription, over-the-counter, or nutritional supplements) unless approved by your health care provider. Many medicines can worsen your kidney damage or may need to have the dose adjusted.   °· Keep all follow-up visits as directed by your health care provider. This is important. °· Observe your condition to make sure you are healing as expected. °SEEK IMMEDIATE MEDICAL CARE IF: °· You are feeling ill or have severe pain in the back or side.   °· Your symptoms return or you have new symptoms. °· You have any symptoms of end-stage kidney disease. These include:   °¨ Persistent itchiness.   °¨   Loss of appetite.   °¨ Headaches.   °¨ Abnormally dark or light skin. °¨ Numbness in the hands or feet.   °¨ Easy bruising.   °¨ Frequent hiccups.   °¨ Menstruation stops.   °· You have a fever. °· You have increased urine production. °· You have pain or bleeding when urinating. °MAKE SURE YOU:  °· Understand these instructions. °· Will watch your condition. °· Will get help right away if you are not doing well or get worse. °  °This  information is not intended to replace advice given to you by your health care provider. Make sure you discuss any questions you have with your health care provider. °  °Document Released: 08/19/2010 Document Revised: 02/24/2014 Document Reviewed: 10/03/2011 °Elsevier Interactive Patient Education ©2016 [employer] ° °

## 2015-10-15 NOTE — Progress Notes (Signed)
Physical Therapy Treatment and Discharge Patient Details Name: Don [name] MRN: [id number] DOB: 1945-09-12 Today's Date: 10/15/2015    History of Present Illness Patient is a 70 yo male admitted 10/12/15 with weakness, diarrhea, dehydration, AKI, syncope x3 with falls.    PMH:  ETOH use, NICM, CHF, LBBB, ICD, CKD, CVA, Gout    PT Comments    Much improved functional mobility and activity tolerance compared to last session; Overall managing quite well; Pt mentioned R knee pain that has been new since these recent falls related to dehydration; Recommend Outpt Ortho consult and potentially Outpt PT follow-up for a closer look at his R knee and shoulder;   Acute PT goals met; OK for dc home from PT standpoint; Will sign off  Follow Up Recommendations  Outpatient PT     Equipment Recommendations  None recommended by PT    Recommendations for Other Services       Precautions / Restrictions Precautions Precautions: Fall Precaution Comments: Syncope with fall x3 on day pta;  Hit Rt shoulder and Rt side of head; Lt shoulder; and back of head during these falls; 8/28 balance much improved after rehydration Restrictions Weight Bearing Restrictions: No    Mobility  Bed Mobility Overal bed mobility: Modified Independent Bed Mobility: Supine to Sit     Supine to sit: Modified independent (Device/Increase time)        Transfers Overall transfer level: Modified independent Equipment used: None Transfers: Sit to/from Stand Sit to Stand: Modified independent (Device/Increase time)         General transfer comment: No need for asssit to steady  Ambulation/Gait Ambulation/Gait assistance: Supervision Ambulation Distance (Feet): 200 Feet Assistive device: None Gait Pattern/deviations: Step-through pattern Gait velocity: approaching normal   General Gait Details: No gross buckling noted; Overall managing walking without an assistive device well; cues to self-monitor for  activity tolerance   Stairs Stairs: Yes Stairs assistance: Min guard Stair Management: One rail Left;Alternating pattern;Forwards Number of Stairs: 12 General stair comments: no difficulty noted; cued pt for sequence if his knee continues to bother him  Wheelchair Mobility    Modified Rankin (Stroke Patients Only)       Balance             Standing balance-Leahy Scale: Fair (approaching Good)                      Cognition Arousal/Alertness: Awake/alert Behavior During Therapy: WFL for tasks assessed/performed Overall Cognitive Status: Within Functional Limits for tasks assessed                      Exercises      General Comments        Pertinent Vitals/Pain Pain Assessment: Faces Faces Pain Scale: Hurts a little bit Pain Location: R shoulder and knee Pain Descriptors / Indicators: Aching Pain Intervention(s): Monitored during session    Home Living                      Prior Function            PT Goals (current goals can now be found in the care plan section) Acute Rehab PT Goals Patient Stated Goal: To get stronger Progress towards PT goals: Goals met/education completed, patient discharged from PT    Frequency  Min 3X/week    PT Plan Current plan remains appropriate    Co-evaluation  End of Session Equipment Utilized During Treatment: Gait belt Activity Tolerance: Patient tolerated treatment well Patient left: in bed;with call bell/phone within reach     Time: 8241-7530 PT Time Calculation (min) (ACUTE ONLY): 14 min  Charges:  $Gait Training: 8-22 mins                    G Codes:      Don [name] 10/15/2015, 9:58 AM  Roney [place], [place] Pager [phone] Office [phone]

## 2015-10-15 NOTE — Progress Notes (Signed)
Subjective: Diarrhea slightly improving. No abdominal pain today, did have some cramping yesterday.  Objective: Vital signs in last 24 hours: Temp:  [97.4 F (36.3 C)-98.2 F (36.8 C)] 97.8 F (36.6 C) (08/28 0512) Pulse Rate:  [76-84] 79 (08/28 0512) Resp:  [18-24] 22 (08/28 0512) BP: (104-156)/(73-99) 156/99 (08/28 0512) SpO2:  [97 %-100 %] 100 % (08/28 0512) Weight change:  Last BM Date: 10/15/15  PE: GEN:  NAD, overweight ABD:  Soft, non-tender  Lab Results: CBC    Component Value Date/Time   WBC 4.4 10/15/2015 0555   RBC 3.55 (L) 10/15/2015 0555   HGB 10.9 (L) 10/15/2015 0555   HGB 13.4 02/14/2014 0916   HCT 34.0 (L) 10/15/2015 0555   HCT 41.1 02/14/2014 0916   PLT 117 (L) 10/15/2015 0555   PLT 165 02/14/2014 0916   MCV 95.8 10/15/2015 0555   MCV 89.7 02/14/2014 0916   MCH 30.7 10/15/2015 0555   MCHC 32.1 10/15/2015 0555   RDW 14.1 10/15/2015 0555   RDW 17.1 (H) 02/14/2014 0916   LYMPHSABS 2.0 10/12/2015 0538   LYMPHSABS 1.5 02/14/2014 0916   MONOABS 1.2 (H) 10/12/2015 0538   MONOABS 0.7 02/14/2014 0916   EOSABS 0.1 10/12/2015 0538   EOSABS 0.1 02/14/2014 0916   BASOSABS 0.0 10/12/2015 0538   BASOSABS 0.1 02/14/2014 0916   CMP     Component Value Date/Time   NA 141 10/15/2015 0555   NA 138 02/14/2014 0916   K 4.4 10/15/2015 0555   K 3.7 02/14/2014 0916   CL 117 (H) 10/15/2015 0555   CO2 21 (L) 10/15/2015 0555   CO2 31 (H) 02/14/2014 0916   GLUCOSE 102 (H) 10/15/2015 0555   GLUCOSE 77 02/14/2014 0916   BUN 10 10/15/2015 0555   BUN 8.2 02/14/2014 0916   CREATININE 0.83 10/15/2015 0555   CREATININE 0.8 02/14/2014 0916   CALCIUM 7.7 (L) 10/15/2015 0555   CALCIUM 8.6 02/14/2014 0916   PROT 6.8 10/12/2015 0538   PROT 6.3 (L) 09/28/2013 1340   ALBUMIN 2.3 (L) 10/15/2015 0555   ALBUMIN 3.0 (L) 09/28/2013 1340   AST 28 10/12/2015 0538   AST 24 09/28/2013 1340   ALT 21 10/12/2015 0538   ALT 12 09/28/2013 1340   ALKPHOS 60 10/12/2015 0538   ALKPHOS  72 09/28/2013 1340   BILITOT 0.6 10/12/2015 0538   BILITOT 0.49 09/28/2013 1340   GFRNONAA >60 10/15/2015 0555   GFRNONAA 85 02/13/2014 1401   GFRAA >60 10/15/2015 0555   GFRAA >89 02/13/2014 1401   Assessment:  1.  Diarrhea, complicated by dehydration with syncope.  Slight improvement on empiric agents.  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   Stool studies negative.  Plan:  1.  Treatment of presumed microscopic (collagenous subtype) colitis with budesonide, Entocort 9 mg po qd. 2.  If no improvement after a couple days of Entocort, would consider colonoscopy, but patient would need to be off clopidigrel for at least 3 days before I would consider colonoscopy. 3.  Heart healthy diet as tolerated. 4.  Stop Lomotil, continue Florastor. 5.  [facility] will follow.   Freddy Jaksch 10/15/2015, 9:20 AM   Pager [phone] If no answer or after 5 PM call [phone]

## 2015-10-15 NOTE — Discharge Summary (Addendum)
Physician Discharge Summary  Don [name] ZOX:[id number] DOB: Oct 06, 1945 DOA: 10/12/2015  PCP: [name], MD  Admit date: 10/12/2015 Discharge date: 10/15/2015  Admitted From: home  Disposition:  home  Recommendations for Outpatient Follow-up:  1. Follow up with PCP in 1-2 weeks  2. Please obtain BMP/CBC in 1-2 weeks to check kidney function and anemia.  Please repeat TFTs in 3-4 weeks.  Reassess ACEI and lasix.   3. Follow up with Dr. Dulce Sellar in 2-3 weeks regarding diarrhea 4. Follow up pending 5 HIAA level, HIT panel, fecal fat level, anti-TTG  Home Health:  None.  Outpatient PT  Equipment/Devices:  none  Discharge Condition:  Stable, improved CODE STATUS:  full  Diet recommendation:  Healthy heart    Brief/Interim Summary:  Don Peek Stephensonis a 70 y.o.malewith history of nonischemic cardiomyopathy, chronic systolic heart failure, left bundle branch block, biventricular ICD, collagenous colitis, and chronic kidney disease who presented to the emergency department with severe weakness and chronic watery, nonbloody diarrhea for several months.  He presented to the ER after he had 3 syncopal episodes and severe orthostasis.  He was found to be severely dehydrated with AKI.  He was started on IVF and gastroenterology was consulted.  His AKI gradually resolved.  Although he was initially treated for sepsis, his antibiotics were discontinued due to low suspicion that his symptoms were caused by bacterial infection.  GI felt that he likely had a flare of his collagenous colitis and recommended starting budesonide.  He was able to eat and drink prior to discharge.  His strength improved and PT recommended outpatient PT.  His stools slowed down some with lomotil, however, he was asked to stop this medication and use budesonide.  Follow up with GI in a few weeks to determine if his budesonide is working.    Discharge Diagnoses:  Principal Problem:   Acute renal failure  (HCC) Active Problems:   Biventricular implantable cardioverter-defibrillator in situ   Chronic combined systolic (congestive) and diastolic (congestive) heart failure (HCC)   Ventricular tachycardia (HCC)   Hypovolemic shock (HCC)   Diarrhea   Hypokalemia   Fall   Dehydration   History of stroke   HLD (hyperlipidemia)   Hypothyroid   Pain in scapula   Collagenous colitis   Thrombocytopenia (HCC)  Chronic/subacute diarrhea with associated dehydration, AKI and weight loss.Diarrhea may be reactivation of collagenous colitis, although he is also a heavy drinker and is at risk for pancreatic insufficiency.  His GI pathogen panel was negative.  Blood cultures NGTD.  -  CT abd/pelvis:  2cm stable mass next to the right kidney and stable mesenteric adenopathy (see below) - Stool pathogen PCR negative -  Tissue transglutaminase pending -  Urine 5-HIAA pending -  He was started on florastor and budesonide and should follow up with GI in a few weeks to determine if he is having any improvement in diarrhea on this regimen.    AKI secondary to ATN from severe chronic dehydration and ACEI and NSAID use.  Baseline creatinine 0.6-0.7.  Creatinine 6.39 on admission.  His ACEI was held and he was advised to not use NSAIDS.  With IVF his creatinine trended down to normal.  He should not resume his ACEI until directed by his PCP.    Syncope due to orthostasis and dehydration, resolved.  Tele:  biv paced.  NSVT, asymptomatic.  Has ICD.  Recent ECHO with EF 40-45%. -  Corrected electrolytes -  Continued BB  EtOH abuse, claims  that he stopped drinking several weeks prior to admission, no signs of ETOH withdrawal -  Given CIWA protocol, thiamine, folate, MVI during hospitalization.  Right scapular pain post falls at home.  No evidence of fracture on scapula and chest x-ray.  Hypokalemia, secondary to diarrhea, resolved with oral and IV supplementation.  Hypomagnesemia, due to chronic GI  losses.  Received several doses of IV magnesium.  Hx of CVA. Started on Plavix in early August.   Retroperitoneal mass 2cm, and mesenteric lymphadenopathy -  Followed by cancer center.  Felt to be benign given stability over many years.  Hypothyroidism, hx of graves s/p ablation.  TSH 0.137 > may be contributing to his diarrhea  - Reduced home synthroid to daily  Ischemic cardiomyopathy / chronic combined systolic and diastolic heart failure /BiVentricular defibrillator in place.  EF 40% - continued carvedilol - held lasix until follow up with PCP  Hyperlipidemia -continue home statin  Gout. renally dose allopurinol.    Normocytic anemia, likely due to ATN/AKI.  Iron, B12, folate wnl.  Alternatively be related to thyroid abnormalities or chronic disease.  Thrombocytopenia, likely due to hemodilution and acute illness.  4T score of 3 (low risk).  Would normally not test unless platelet count decrease > 50%, however, since this is borderline, sent antiplatelet antibody. -  HIT ab pending  Discharge Instructions  Discharge Instructions    (HEART FAILURE PATIENTS) Call MD:  Anytime you have any of the following symptoms: 1) 3 pound weight gain in 24 hours or 5 pounds in 1 week 2) shortness of breath, with or without a dry hacking cough 3) swelling in the hands, feet or stomach 4) if you have to sleep on extra pillows at night in order to breathe.    Complete by:  As directed   Call MD for:  difficulty breathing, headache or visual disturbances    Complete by:  As directed   Call MD for:  extreme fatigue    Complete by:  As directed   Call MD for:  hives    Complete by:  As directed   Call MD for:  persistant dizziness or light-headedness    Complete by:  As directed   Call MD for:  persistant nausea and vomiting    Complete by:  As directed   Call MD for:  severe uncontrolled pain    Complete by:  As directed   Call MD for:  temperature >100.4    Complete by:  As  directed   Diet - low sodium heart healthy    Complete by:  As directed   Discharge instructions    Complete by:  As directed   Because of what happened with your kidneys, please stop your lasix, benazepril and potassium.  Do not throw these medications away because your doctor may restart them soon.  Your thyroid hormone was a little abnormal so I have reduced your synthroid/levothyroxine dose by half for now.  Just cut your pills in half if you'd prefer.  Your primary care doctor should check your thyroid levels in a few weeks and may make some adjustments then.   You will need to have your kidney function checked in 1-2 weeks and some lab tests followed up on by your primary care doctor so please make sure to schedule and keep your primary care doctor's appointment.  Please do not take any over the counter medications other than tylenol without consulting your primary care doctor.  For your diarrhea, please  start budesonide.  You may use lomotil or imodium (available over the counter) for the next three days as needed for diarrhea to give the budesonide time to kick in.  After three days, please ONLY use the budesonide once daily and stop using any as-needed anti-diarrheal medications until you follow up with Dr. Dulce Sellar.   Increase activity slowly    Complete by:  As directed       Medication List    STOP taking these medications   benazepril 10 MG tablet Commonly known as:  LOTENSIN   furosemide 20 MG tablet Commonly known as:  LASIX   potassium chloride 10 MEQ tablet Commonly known as:  K-DUR     TAKE these medications   allopurinol 100 MG tablet Commonly known as:  ZYLOPRIM Take 100 mg by mouth daily.   aspirin 81 MG tablet Take 1 tablet (81 mg total) by mouth daily.   atorvastatin 40 MG tablet Commonly known as:  LIPITOR Take 40 mg by mouth daily.   budesonide 3 MG 24 hr capsule Commonly known as:  ENTOCORT EC Take 3 capsules (9 mg total) by mouth daily.   calcium  carbonate 500 MG chewable tablet Commonly known as:  TUMS - dosed in mg elemental calcium Chew 1 tablet by mouth daily.   carvedilol 6.25 MG tablet Commonly known as:  COREG Take 6.25 mg by mouth 2 (two) times daily with a meal.   Cholecalciferol 4000 units Caps Take 1 capsule by mouth daily.   clopidogrel 75 MG tablet Commonly known as:  PLAVIX Take 1 tablet (75 mg total) by mouth daily.   levothyroxine 50 MCG tablet Commonly known as:  SYNTHROID, LEVOTHROID Take 1 tablet (50 mcg total) by mouth daily before breakfast. What changed:  medication strength  how much to take   nitroGLYCERIN 0.4 MG SL tablet Commonly known as:  NITROSTAT Place 0.4 mg under the tongue every 5 (five) minutes as needed for chest pain.   OVER THE COUNTER MEDICATION Take 1 tablet by mouth 2 (two) times daily. Multi-allergy tablet   saccharomyces boulardii 250 MG capsule Commonly known as:  FLORASTOR Take 1 capsule (250 mg total) by mouth 2 (two) times daily.      Follow-up Information    [name], MD. Call in 1 week(s).   Specialty:  Family Medicine Contact information: [address] [place] [address] [phone]        Freddy Jaksch, MD Follow up in 2 week(s).   Specialty:  Gastroenterology Contact information: [address] [place] [address] [phone]          Allergies  Allergen Reactions  . Sulfa Antibiotics Nausea And Vomiting    Consultations: Gastroenterology, Dr. Dulce Sellar   Procedures/Studies: Ct Abdomen Pelvis Wo Contrast  Result Date: 10/12/2015 CLINICAL DATA:  Severe diarrhea.  Periumbilical abdominal pain. EXAM: CT ABDOMEN AND PELVIS WITHOUT CONTRAST TECHNIQUE: Multidetector CT imaging of the abdomen and pelvis was performed following the standard protocol without IV contrast. COMPARISON:  CT scan of September 04, 2014 and February 14, 2014. FINDINGS: Mild multilevel degenerative disc disease is noted in the lower  lumbar spine. Visualized lung bases are unremarkable. No gallstones are noted. Stable left hepatic cyst is noted. The spleen and pancreas are unremarkable on these unenhanced images. Adrenal glands and kidneys appear normal. No hydronephrosis or renal obstruction is noted. No renal or ureteral calculi are noted. Atherosclerosis of abdominal aorta is noted without aneurysm formation. The appendix appears normal.  There is no evidence of bowel obstruction. Stable mildly enlarged mesenteric adenopathy is noted most consistent with benign or reactive etiology. This most likely represents reactive or inflammatory nodes. Stable 2 cm low density seen medial to right kidney most consistent with benign process. IMPRESSION: Stable mesenteric adenopathy most likely benign or reactive in etiology. Aortic atherosclerosis. No acute abnormality seen in the abdomen or pelvis. Electronically Signed   By: Lupita [name], M.D.   On: 10/12/2015 11:06   Ct Angio Head W/cm &/or Wo Cm  Result Date: 09/18/2015 CLINICAL DATA:  70 year old hypertensive male with right facial numbness and dizziness with headache beginning today. Symptoms have partially resolved. Subsequent encounter. EXAM: CT ANGIOGRAPHY HEAD AND NECK TECHNIQUE: Multidetector CT imaging of the head and neck was performed using the standard protocol during bolus administration of intravenous contrast. Multiplanar CT image reconstructions and MIPs were obtained to evaluate the vascular anatomy. Carotid stenosis measurements (when applicable) are obtained utilizing NASCET criteria, using the distal internal carotid diameter as the denominator. CONTRAST:  50 cc Isovue 370. COMPARISON:  09/18/2015 head CT. FINDINGS: CT HEAD Brain: No intracranial hemorrhage or CT evidence of large acute infarct. Chronic microvascular changes. Global atrophy without hydrocephalus. No intracranial enhancing lesion. Calvarium and skull base: Negative. Paranasal sinuses: Polypoid opacification  maxillary sinuses greater on the right Orbits: Minimal exophthalmos. CTA NECK Aortic arch: 3 vessel arch with scattered calcified plaque. Mild ectasia. Right carotid system: Plaque right carotid bifurcation/ proximal right internal carotid artery with less than 50% diameter stenosis. Ectatic distal cervical segment of the right internal carotid artery. Left carotid system: Plaque left carotid bifurcation/proximal left internal carotid artery with less than 50% maximal diameter stenosis. Ectatic distal cervical segment of the right internal carotid artery. Vertebral arteries:Right vertebral artery is dominant. Plaque with mild narrowing proximal right vertebral artery. Plaque with mild narrowing right subclavian artery. Skeleton: Scoliosis cervical spine upper thoracic spine convex left. Superimposed cervical spondylotic changes most notable C5-6. Other neck: Pacemaker enters from the left with 2 leads. No worrisome lung apical lesion or neck mass. CTA HEAD Anterior circulation: Plaque cavernous segment internal carotid artery with mild to slightly moderate narrowing bilaterally. Fetal contribution to the right posterior cerebral artery. No significant stenosis of either carotid terminus, M1 segment or A1 segment of the middle cerebral artery or anterior cerebral artery on either side. Posterior circulation: Right vertebral artery is dominant. Mild narrowing distal vertebral arteries bilaterally. Ectatic basilar artery without significant narrowing. Mild irregularity with regions of mild narrowing involving portions of the proximal mid and distal aspect of the posterior cerebral artery bilaterally. Venous sinuses: Patent. Anatomic variants: As above. Delayed phase: As above. IMPRESSION: CT HEAD No intracranial hemorrhage or CT evidence of large acute infarct. Chronic microvascular changes. Global atrophy without hydrocephalus. No intracranial enhancing lesion. Polypoid opacification maxillary sinuses greater on the  right CTA NECK Plaque carotid bifurcation/ proximal internal carotid artery bilaterally with less than 50% diameter stenosis. Ectatic distal cervical segment of the internal carotid artery bilaterally. Right vertebral artery is dominant. Plaque with mild narrowing proximal right vertebral artery. Plaque with mild narrowing right subclavian artery. No significant stenosis left vertebral artery. CTA HEAD Plaque cavernous segment internal carotid artery with mild to slightly moderate narrowing bilaterally. Fetal contribution to the right posterior cerebral artery. No significant stenosis of either carotid terminus, M1 segment or A1 segment of the middle cerebral artery or anterior cerebral artery on either side. Right vertebral artery is dominant. Mild narrowing distal vertebral arteries bilaterally. Ectatic basilar  artery without significant narrowing. Mild irregularity with regions of mild narrowing involving portions of the proximal, mid and distal aspect of the posterior cerebral artery bilaterally. Electronically Signed   By: Lacy Duverney M.D.   On: 09/18/2015 16:58   Dg Scapula Right  Result Date: 10/12/2015 CLINICAL DATA:  Recent fall with right shoulder pain, initial encounter EXAM: RIGHT SCAPULA - 2+ VIEWS COMPARISON:  None. FINDINGS: Mild degenerative changes of the acromioclavicular joint are seen. No fracture or dislocation is noted. The underlying bony thorax is within normal limits. IMPRESSION: No acute abnormality noted. Electronically Signed   By: Alcide Clever M.D.   On: 10/12/2015 10:10   Ct Angio Neck W/cm &/or Wo/cm  Result Date: 09/18/2015 CLINICAL DATA:  69 year old hypertensive male with right facial numbness and dizziness with headache beginning today. Symptoms have partially resolved. Subsequent encounter. EXAM: CT ANGIOGRAPHY HEAD AND NECK TECHNIQUE: Multidetector CT imaging of the head and neck was performed using the standard protocol during bolus administration of intravenous  contrast. Multiplanar CT image reconstructions and MIPs were obtained to evaluate the vascular anatomy. Carotid stenosis measurements (when applicable) are obtained utilizing NASCET criteria, using the distal internal carotid diameter as the denominator. CONTRAST:  50 cc Isovue 370. COMPARISON:  09/18/2015 head CT. FINDINGS: CT HEAD Brain: No intracranial hemorrhage or CT evidence of large acute infarct. Chronic microvascular changes. Global atrophy without hydrocephalus. No intracranial enhancing lesion. Calvarium and skull base: Negative. Paranasal sinuses: Polypoid opacification maxillary sinuses greater on the right Orbits: Minimal exophthalmos. CTA NECK Aortic arch: 3 vessel arch with scattered calcified plaque. Mild ectasia. Right carotid system: Plaque right carotid bifurcation/ proximal right internal carotid artery with less than 50% diameter stenosis. Ectatic distal cervical segment of the right internal carotid artery. Left carotid system: Plaque left carotid bifurcation/proximal left internal carotid artery with less than 50% maximal diameter stenosis. Ectatic distal cervical segment of the right internal carotid artery. Vertebral arteries:Right vertebral artery is dominant. Plaque with mild narrowing proximal right vertebral artery. Plaque with mild narrowing right subclavian artery. Skeleton: Scoliosis cervical spine upper thoracic spine convex left. Superimposed cervical spondylotic changes most notable C5-6. Other neck: Pacemaker enters from the left with 2 leads. No worrisome lung apical lesion or neck mass. CTA HEAD Anterior circulation: Plaque cavernous segment internal carotid artery with mild to slightly moderate narrowing bilaterally. Fetal contribution to the right posterior cerebral artery. No significant stenosis of either carotid terminus, M1 segment or A1 segment of the middle cerebral artery or anterior cerebral artery on either side. Posterior circulation: Right vertebral artery is  dominant. Mild narrowing distal vertebral arteries bilaterally. Ectatic basilar artery without significant narrowing. Mild irregularity with regions of mild narrowing involving portions of the proximal mid and distal aspect of the posterior cerebral artery bilaterally. Venous sinuses: Patent. Anatomic variants: As above. Delayed phase: As above. IMPRESSION: CT HEAD No intracranial hemorrhage or CT evidence of large acute infarct. Chronic microvascular changes. Global atrophy without hydrocephalus. No intracranial enhancing lesion. Polypoid opacification maxillary sinuses greater on the right CTA NECK Plaque carotid bifurcation/ proximal internal carotid artery bilaterally with less than 50% diameter stenosis. Ectatic distal cervical segment of the internal carotid artery bilaterally. Right vertebral artery is dominant. Plaque with mild narrowing proximal right vertebral artery. Plaque with mild narrowing right subclavian artery. No significant stenosis left vertebral artery. CTA HEAD Plaque cavernous segment internal carotid artery with mild to slightly moderate narrowing bilaterally. Fetal contribution to the right posterior cerebral artery. No significant stenosis of either carotid terminus,  M1 segment or A1 segment of the middle cerebral artery or anterior cerebral artery on either side. Right vertebral artery is dominant. Mild narrowing distal vertebral arteries bilaterally. Ectatic basilar artery without significant narrowing. Mild irregularity with regions of mild narrowing involving portions of the proximal, mid and distal aspect of the posterior cerebral artery bilaterally. Electronically Signed   By: Lacy Duverney M.D.   On: 09/18/2015 16:58   Dg Chest Port 1 View  Result Date: 10/12/2015 CLINICAL DATA:  Weakness, possible sepsis EXAM: PORTABLE CHEST 1 VIEW COMPARISON:  09/04/2014 FINDINGS: Cardiac shadow is stable. A defibrillator is again seen. No focal infiltrate or sizable effusion is noted. No bony  abnormality is seen. IMPRESSION: No active disease. Electronically Signed   By: Alcide Clever M.D.   On: 10/12/2015 10:09   Ct Head Code Stroke W/o Cm  Result Date: 09/18/2015 CLINICAL DATA:  Code stroke. 70 year old male with right-sided facial droop. Initial encounter. EXAM: CT HEAD WITHOUT CONTRAST TECHNIQUE: Contiguous axial images were obtained from the base of the skull through the vertex without intravenous contrast. COMPARISON:  09/04/2014 CT. FINDINGS: No intracranial hemorrhage. Chronic microvascular changes without CT evidence of large acute infarct. Tortuous vertebral arteries and basilar artery. Partial calcification cavernous segment internal carotid artery bilaterally. Global atrophy without hydrocephalus. No intracranial mass lesion noted on this unenhanced exam. Polypoid opacification maxillary sinuses more notable on the right. Mild mucosal thickening ethmoid sinus air cells. No acute orbital abnormality.  Minimal exophthalmos. ASPECTS [facility] Stroke Program Early CT Score, http://[email or web address].com) - Ganglionic level infarction (caudate, lentiform nuclei, internal capsule, insula, M1-M3 cortex): 7 - Supraganglionic infarction (M4-M6 cortex): 3 Total score (0-10): 10 IMPRESSION: 1. No intracranial hemorrhage. Chronic microvascular changes without CT evidence of large acute infarct. Global atrophy. Polypoid opacification maxillary sinuses greater on the right. Tortuous vertebral arteries and basilar artery. Partially calcified carotid arteries. 2. ASPECTS score 10 Electronically Signed   By: Lacy Duverney M.D.   On: 09/18/2015 13:30    Subjective:  Feeling much better since admission.  Strength coming back.  No longer lightheaded when standing.  Stools are less frequent and more formed/less watery.    Discharge Exam: Vitals:   10/15/15 0512 10/15/15 0922  BP: (!) 156/99 (!) 141/89  Pulse: 79 69  Resp: (!) 22 20  Temp: 97.8 F (36.6 C) 97.9 F (36.6 C)   Vitals:   10/14/15  1723 10/14/15 1958 10/15/15 0512 10/15/15 0922  BP: 134/88 (!) 143/87 (!) 156/99 (!) 141/89  Pulse: 84 81 79 69  Resp: 20 18 (!) 22 20  Temp: 97.6 F (36.4 C) 97.4 F (36.3 C) 97.8 F (36.6 C) 97.9 F (36.6 C)  TempSrc: Oral   Oral  SpO2: 98% 100% 100% 100%  Weight:      Height:        General exam:  Adult male.  No acute distress.   HEENT:  NCAT, MMM, no tongue fasciculations Respiratory system: Clear to auscultation bilaterally Cardiovascular system: Regular rate and rhythm, normal S1/S2. 2/6 systolic murmur at the LSB.  Warm extremities Gastrointestinal system: Normal active bowel sounds, soft, nondistended, nontender. MSK:  Normal tone and bulk, no lower extremity edema Neuro:  Grossly intact.  No obvious tremors.     The results of significant diagnostics from this hospitalization (including imaging, microbiology, ancillary and laboratory) are listed below for reference.     Microbiology: Recent Results (from the past 240 hour(s))  Culture, blood (x 2)     Status: None (Preliminary result)  Collection Time: 10/12/15  9:45 AM  Result Value Ref Range Status   Specimen Description BLOOD LEFT HAND  Final   Special Requests BOTTLES DRAWN AEROBIC AND ANAEROBIC  5CC  Final   Culture NO GROWTH 2 DAYS  Final   Report Status PENDING  Incomplete  Culture, blood (x 2)     Status: None (Preliminary result)   Collection Time: 10/12/15 10:00 AM  Result Value Ref Range Status   Specimen Description BLOOD RIGHT ARM  Final   Special Requests BOTTLES DRAWN AEROBIC AND ANAEROBIC  5CC  Final   Culture NO GROWTH 2 DAYS  Final   Report Status PENDING  Incomplete  MRSA PCR Screening     Status: None   Collection Time: 10/12/15 11:39 AM  Result Value Ref Range Status   MRSA by PCR NEGATIVE NEGATIVE Final    Comment:        The GeneXpert MRSA Assay (FDA approved for NASAL specimens only), is one component of a comprehensive MRSA colonization surveillance program. It is  not intended to diagnose MRSA infection nor to guide or monitor treatment for MRSA infections.   Gastrointestinal Panel by PCR , Stool     Status: None   Collection Time: 10/12/15 11:52 AM  Result Value Ref Range Status   Campylobacter species NOT DETECTED NOT DETECTED Final   Plesimonas shigelloides NOT DETECTED NOT DETECTED Final   Salmonella species NOT DETECTED NOT DETECTED Final   Yersinia enterocolitica NOT DETECTED NOT DETECTED Final   Vibrio species NOT DETECTED NOT DETECTED Final   Vibrio cholerae NOT DETECTED NOT DETECTED Final   Enteroaggregative E coli (EAEC) NOT DETECTED NOT DETECTED Final   Enteropathogenic E coli (EPEC) NOT DETECTED NOT DETECTED Final   Enterotoxigenic E coli (ETEC) NOT DETECTED NOT DETECTED Final   Shiga like toxin producing E coli (STEC) NOT DETECTED NOT DETECTED Final   E. coli O157 NOT DETECTED NOT DETECTED Final   Shigella/Enteroinvasive E coli (EIEC) NOT DETECTED NOT DETECTED Final   Cryptosporidium NOT DETECTED NOT DETECTED Final   Cyclospora cayetanensis NOT DETECTED NOT DETECTED Final   Entamoeba histolytica NOT DETECTED NOT DETECTED Final   Giardia lamblia NOT DETECTED NOT DETECTED Final   Adenovirus F40/41 NOT DETECTED NOT DETECTED Final   Astrovirus NOT DETECTED NOT DETECTED Final   Norovirus GI/GII NOT DETECTED NOT DETECTED Final   Rotavirus A NOT DETECTED NOT DETECTED Final   Sapovirus (I, II, IV, and V) NOT DETECTED NOT DETECTED Final  C difficile quick scan w PCR reflex     Status: None   Collection Time: 10/13/15  9:37 PM  Result Value Ref Range Status   C Diff antigen NEGATIVE NEGATIVE Final   C Diff toxin NEGATIVE NEGATIVE Final   C Diff interpretation No C. difficile detected.  Final     Labs: BNP (last 3 results) No results for input(s): BNP in the last 8760 hours. Basic Metabolic Panel:  Recent Labs Lab 10/12/15 0538 10/12/15 1638 10/13/15 0255 10/14/15 0456 10/15/15 0555  NA 137 139 140 143 141  K 2.8* 3.1* 3.1*  3.8 4.4  CL 98* 112* 115* 120* 117*  CO2 22 17* 19* 19* 21*  GLUCOSE 115* 100* 91 84 102*  BUN 31* 27* 24* 16 10  CREATININE 6.39* 4.75* 3.54* 1.25* 0.83  CALCIUM 8.7* 7.1* 6.8* 7.5* 7.7*  MG 1.8  --   --  1.4* 1.7  PHOS  --   --   --  2.9 2.2*  Liver Function Tests:  Recent Labs Lab 10/12/15 0538 10/14/15 0456 10/15/15 0555  AST 28  --   --   ALT 21  --   --   ALKPHOS 60  --   --   BILITOT 0.6  --   --   PROT 6.8  --   --   ALBUMIN 3.3* 2.3* 2.3*    Recent Labs Lab 10/12/15 0538  LIPASE 22   No results for input(s): AMMONIA in the last 168 hours. CBC:  Recent Labs Lab 10/12/15 0538 10/13/15 0255 10/14/15 0456 10/15/15 0555  WBC 14.0* 6.0 4.0 4.4  NEUTROABS 10.6*  --   --   --   HGB 14.6 11.4* 10.5* 10.9*  HCT 43.3 35.5* 32.0* 34.0*  MCV 92.9 94.9 94.4 95.8  PLT 198 122* 104* 117*   Cardiac Enzymes:  Recent Labs Lab 10/12/15 1000 10/12/15 1358  TROPONINI <0.03 <0.03   BNP: Invalid input(s): POCBNP CBG:  Recent Labs Lab 10/13/15 1202  GLUCAP 90   D-Dimer No results for input(s): DDIMER in the last 72 hours. Hgb A1c No results for input(s): HGBA1C in the last 72 hours. Lipid Profile No results for input(s): CHOL, HDL, LDLCALC, TRIG, CHOLHDL, LDLDIRECT in the last 72 hours. Thyroid function studies  Recent Labs  10/14/15 0806  TSH 0.083*   Anemia work up  Recent Labs  10/14/15 0806  VITAMINB12 396  FOLATE 16.5  FERRITIN 406*  TIBC 176*  IRON 107   Urinalysis    Component Value Date/Time   COLORURINE YELLOW 10/12/2015 2143   APPEARANCEUR CLEAR 10/12/2015 2143   LABSPEC 1.017 10/12/2015 2143   PHURINE 5.5 10/12/2015 2143   GLUCOSEU NEGATIVE 10/12/2015 2143   HGBUR NEGATIVE 10/12/2015 2143   BILIRUBINUR NEGATIVE 10/12/2015 2143   KETONESUR NEGATIVE 10/12/2015 2143   PROTEINUR NEGATIVE 10/12/2015 2143   UROBILINOGEN 1.0 09/04/2014 1554   NITRITE NEGATIVE 10/12/2015 2143   LEUKOCYTESUR NEGATIVE 10/12/2015 2143   Sepsis  Labs Invalid input(s): PROCALCITONIN,  WBC,  LACTICIDVEN   Time coordinating discharge: Over 30 minutes  SIGNED:   Renae Fickle, MD  [facility] 10/15/2015, 11:20 AM Pager   If 7PM-7AM, please contact night-coverage [email or web address] Password [email or web address]

## 2015-10-16 ENCOUNTER — Other Ambulatory Visit: Payer: Self-pay | Admitting: *Deleted

## 2015-10-16 LAB — FECAL FAT, QUALITATIVE
FAT QUAL TOTAL STL: NORMAL
Fat Qual Neutral, Stl: NORMAL

## 2015-10-16 LAB — TISSUE TRANSGLUTAMINASE, IGA

## 2015-10-16 LAB — HEPARIN INDUCED PLATELET AB (HIT ANTIBODY): HEPARIN INDUCED PLT AB: 0.088 {OD_unit} (ref 0.000–0.400)

## 2015-10-16 NOTE — Patient Outreach (Addendum)
[facility]) Care Management  10/16/2015  Don [name] December 15, 1945 [id number]   RN spoke with pt and introduced the [facility] program and services. Pt receptive and remembers the hospital liaison Raiford Noble, RN). RN inquired on pt's transition home and inquired if this was a good time to talk pt willing to talk however indicates he will have company soon. RN offered to follow up tomorrow and inquired what time of day would be suitable. Will follow up tomorrow and completed this transition of care and initial the assessment and possible program to start [facility] services.   Don Cousin, RN Care Management Coordinator [facility] Main Office [phone]

## 2015-10-17 ENCOUNTER — Other Ambulatory Visit: Payer: Self-pay | Admitting: *Deleted

## 2015-10-17 ENCOUNTER — Encounter: Payer: Self-pay | Admitting: *Deleted

## 2015-10-17 LAB — CULTURE, BLOOD (ROUTINE X 2)
CULTURE: NO GROWTH
CULTURE: NO GROWTH

## 2015-10-17 LAB — FECAL LACTOFERRIN, QUANT: Lactoferrin, Fecal, Quant.: 22.6 ug/mL(g) — ABNORMAL HIGH (ref 0.00–7.24)

## 2015-10-17 NOTE — Patient Outreach (Addendum)
[facility]) Care Management  10/17/2015  Don [name] 02/05/1946 [id number]   Transition of care recently discharged on 8/28. Note hospitalization was not related to pt's HF  RN spoke with pt today as requested via call back and reintroduced [facility] services and available programs. Pt remembers the consult while in the hospital. Pt states he is doing well with no problems at this time but appreciative of the follow up call. Completed and verified information for the transition of care template. Pt reports his admission was related to diarrhea and currently his HF is controlled but receptive to the information provided.  RN will discussed on the HF zones and verified pt remains in the GREEN zone with no swelling or difficulty breathing. RN pt has been completing daily weights with the readings as followed: Today 243 lbs, Yesterday 242 lbs and prior to 240 lbs. RN further discussed the risk involved if his medical condition is not monitored with quick interventions when needed. Reviewed the HF zones and stress the importance if 3 lbs is gain overnight or 5 lbs within one week to contact his provided due to fluid retention ( pt with understanding). Offered community case management for face-to-face education with a community home visits however pt declined at this time but receptive to ongoing transition of care contacts over the next weeks (scheduled a call  for next week for ongoing transition of care). Plan of care discussed along with set goals that pt has agreed upon. Will continue to follow up and monitor accordingly. Also offered [facility] printed material however pt receptive at this time to telephonic educations concerning his medical condition.   Patient was recently discharged from hospital and all medications have been reviewed.  [name], RN Care Management Coordinator [facility] Main Office [phone]

## 2015-10-23 DIAGNOSIS — I5042 Chronic combined systolic (congestive) and diastolic (congestive) heart failure: Secondary | ICD-10-CM | POA: Diagnosis not present

## 2015-10-23 DIAGNOSIS — N179 Acute kidney failure, unspecified: Secondary | ICD-10-CM | POA: Diagnosis not present

## 2015-10-23 DIAGNOSIS — G459 Transient cerebral ischemic attack, unspecified: Secondary | ICD-10-CM | POA: Diagnosis not present

## 2015-10-23 DIAGNOSIS — E890 Postprocedural hypothyroidism: Secondary | ICD-10-CM | POA: Diagnosis not present

## 2015-10-23 DIAGNOSIS — Z95810 Presence of automatic (implantable) cardiac defibrillator: Secondary | ICD-10-CM | POA: Diagnosis not present

## 2015-10-23 DIAGNOSIS — I10 Essential (primary) hypertension: Secondary | ICD-10-CM | POA: Diagnosis not present

## 2015-10-23 DIAGNOSIS — Z1159 Encounter for screening for other viral diseases: Secondary | ICD-10-CM | POA: Diagnosis not present

## 2015-10-23 DIAGNOSIS — Z23 Encounter for immunization: Secondary | ICD-10-CM | POA: Diagnosis not present

## 2015-10-23 LAB — 5 HIAA, QUANTITATIVE, URINE, 24 HOUR
5-HIAA, Ur: 3.1 mg/L
5-HIAA,QUANT.,24 HR URINE: 0.3 mg/(24.h) (ref 0.0–14.9)
Total Volume: 100

## 2015-10-24 ENCOUNTER — Other Ambulatory Visit: Payer: Self-pay | Admitting: *Deleted

## 2015-10-24 ENCOUNTER — Ambulatory Visit: Payer: PPO | Admitting: *Deleted

## 2015-10-24 ENCOUNTER — Telehealth: Payer: Self-pay | Admitting: *Deleted

## 2015-10-24 DIAGNOSIS — Z95810 Presence of automatic (implantable) cardiac defibrillator: Secondary | ICD-10-CM | POA: Diagnosis not present

## 2015-10-24 NOTE — Patient Outreach (Addendum)
[facility]) Care Management  10/24/2015  Don [name] [birth year] [id number]   RN spoke with pt today and inquired on pt's ongoing management of care related to his HF. Verified pt remains in the GREEN zone with no swelling or reported issues however pt's Lasix was started back at 20 mg daily.  Pt states he had a few events and passed out when his ICD fired. Pt was contacted by Dr. Lubertha Basque office who will review the details of the events and contact pt on possible interventions. Pt states his scales are currently not working but his last reported weight on a provider's visit yesterday was 219 lbs and last week 224 lbs. Plan of care and goals reviewed and will be re-evaluated next week with the ongoing transition of care contacts.  [name], RN Care Management Coordinator [facility] Main Office [phone]

## 2015-10-24 NOTE — Telephone Encounter (Signed)
Transmission received due to 3 shocks for VF 10/22/15 early morning.  Mr. Gallipeau says that he recalls being in bed and having a strange feeling that he says is hard to describe. He says that he felt as if he "was slipping away for a little bit" and then he was scared to go back to sleep. He feels fine now and reports that he takes his 6.25mg  of coreg twice daily. He is aware of driving restrictions x 6 months per [place].  I will review these episodes with Dr. Ladona Ridgel and call Mr. Thaut back with any recommendations. He is aware to call 911 if he experiences another episode such as Monday morning. He verbalizes understanding.

## 2015-10-24 NOTE — Progress Notes (Signed)
Remote ICD transmission.   

## 2015-10-25 NOTE — Telephone Encounter (Signed)
Reviewed with Dr. Ladona Ridgel. Recommendation to schedule OV with GT next week. Given to [employer] to schedule.

## 2015-10-26 ENCOUNTER — Encounter: Payer: Self-pay | Admitting: Internal Medicine

## 2015-10-26 ENCOUNTER — Encounter: Payer: Self-pay | Admitting: Cardiology

## 2015-10-29 ENCOUNTER — Encounter: Payer: Self-pay | Admitting: Internal Medicine

## 2015-10-29 ENCOUNTER — Ambulatory Visit (INDEPENDENT_AMBULATORY_CARE_PROVIDER_SITE_OTHER): Payer: PPO | Admitting: Internal Medicine

## 2015-10-29 VITALS — BP 136/102 | HR 82 | Ht 72.0 in | Wt 216.2 lb

## 2015-10-29 DIAGNOSIS — Z95810 Presence of automatic (implantable) cardiac defibrillator: Secondary | ICD-10-CM

## 2015-10-29 DIAGNOSIS — I472 Ventricular tachycardia, unspecified: Secondary | ICD-10-CM

## 2015-10-29 DIAGNOSIS — I5022 Chronic systolic (congestive) heart failure: Secondary | ICD-10-CM | POA: Diagnosis not present

## 2015-10-29 LAB — CUP PACEART INCLINIC DEVICE CHECK
Brady Statistic AP VP Percent: 13.34 %
Brady Statistic AP VS Percent: 0.03 %
Brady Statistic AS VS Percent: 3.47 %
Date Time Interrogation Session: 20170911111404
HIGH POWER IMPEDANCE MEASURED VALUE: 47 Ohm
HIGH POWER IMPEDANCE MEASURED VALUE: 58 Ohm
HIGH POWER IMPEDANCE MEASURED VALUE: 665 Ohm
Implantable Lead Implant Date: 20080710
Implantable Lead Implant Date: 20080710
Implantable Lead Location: 753858
Implantable Lead Location: 753859
Implantable Lead Location: 753860
Implantable Lead Model: 5076
Lead Channel Impedance Value: 285 Ohm
Lead Channel Impedance Value: 456 Ohm
Lead Channel Impedance Value: 513 Ohm
Lead Channel Impedance Value: 760 Ohm
Lead Channel Pacing Threshold Amplitude: 0.5 V
Lead Channel Pacing Threshold Amplitude: 1 V
Lead Channel Pacing Threshold Pulse Width: 0.4 ms
Lead Channel Pacing Threshold Pulse Width: 0.4 ms
Lead Channel Sensing Intrinsic Amplitude: 3.25 mV
Lead Channel Setting Pacing Amplitude: 2 V
Lead Channel Setting Pacing Pulse Width: 0.4 ms
Lead Channel Setting Sensing Sensitivity: 0.3 mV
MDC IDC LEAD IMPLANT DT: 20080710
MDC IDC LEAD MODEL: 4194
MDC IDC MSMT BATTERY VOLTAGE: 2.95 V
MDC IDC MSMT LEADCHNL LV IMPEDANCE VALUE: 589 Ohm
MDC IDC MSMT LEADCHNL RA PACING THRESHOLD AMPLITUDE: 0.5 V
MDC IDC MSMT LEADCHNL RV PACING THRESHOLD PULSEWIDTH: 0.4 ms
MDC IDC MSMT LEADCHNL RV SENSING INTR AMPL: 5.375 mV
MDC IDC SET LEADCHNL LV PACING AMPLITUDE: 2.25 V
MDC IDC SET LEADCHNL RV PACING AMPLITUDE: 2.5 V
MDC IDC SET LEADCHNL RV PACING PULSEWIDTH: 0.4 ms
MDC IDC STAT BRADY AS VP PERCENT: 83.16 %
MDC IDC STAT BRADY RA PERCENT PACED: 13.37 %
MDC IDC STAT BRADY RV PERCENT PACED: 96.5 %

## 2015-10-29 MED ORDER — WARFARIN SODIUM 3 MG PO TABS: 3.0000 mg | ORAL_TABLET | Freq: Every day | ORAL | 0 refills | Status: DC

## 2015-10-29 NOTE — Progress Notes (Signed)
Referred to ICM clinic by Levander Campion, RN and Dr Lovena Le.  Met patient in the office and provided ICM program information.  He agreed to monthly ICM calls.  1st ICM remote transmission scheduled for 11/12/2015 due to patient had abnormal Optivol for 10/29/2015.  Provided ICM phone number and encouraged to call for any fluid symptoms.

## 2015-10-29 NOTE — Progress Notes (Signed)
HPI Don [name] returns today for followup. He is a very pleasant 70 year old man with a history of nonischemic cardiomyopathy, chronic systolic heart failure, left bundle branch block, status post biventricular ICD implantation. The patient has a history of alcohol abuse, but denies drinking lately. He denies syncope. He was in the hospital several weeks ago with a stroke. He was at home and passed out and was subsequently found to have VT/VF with 3 appropriate ICD shocks. He did not seek medical attention. He admits to some drinking but has decided to stop. Allergies  Allergen Reactions  . Sulfa Antibiotics Nausea And Vomiting     Current Outpatient Prescriptions  Medication Sig Dispense Refill  . allopurinol (ZYLOPRIM) 100 MG tablet Take 100 mg by mouth daily.     Marland Kitchen aspirin 81 MG tablet Take 1 tablet (81 mg total) by mouth daily. 30 tablet 0  . atorvastatin (LIPITOR) 40 MG tablet Take 40 mg by mouth daily.     . benazepril (LOTENSIN) 10 MG tablet Take 1 tablet by mouth daily.    . budesonide (ENTOCORT EC) 3 MG 24 hr capsule Take 3 capsules (9 mg total) by mouth daily. 30 capsule 0  . calcium carbonate (TUMS - DOSED IN MG ELEMENTAL CALCIUM) 500 MG chewable tablet Chew 1 tablet by mouth daily.    . carvedilol (COREG) 6.25 MG tablet Take 6.25 mg by mouth 2 (two) times daily with a meal.    . Cholecalciferol 4000 UNITS CAPS Take 1 capsule by mouth daily.    . clopidogrel (PLAVIX) 75 MG tablet Take 1 tablet (75 mg total) by mouth daily. 30 tablet 0  . furosemide (LASIX) 20 MG tablet Take 1 tablet by mouth daily.    Marland Kitchen levothyroxine (SYNTHROID, LEVOTHROID) 50 MCG tablet Take 1 tablet (50 mcg total) by mouth daily before breakfast. 30 tablet 0  . nitroGLYCERIN (NITROSTAT) 0.4 MG SL tablet Place 0.4 mg under the tongue every 5 (five) minutes as needed for chest pain.     . NONFORMULARY OR COMPOUNDED ITEM Outpatient PT, eval and treat Indication:  Syncope.  Orthostatic hypotension.   Generalized weakness. 1 each 0  . OVER THE COUNTER MEDICATION Take 1 tablet by mouth 2 (two) times daily. Multi-allergy tablet    . potassium chloride (K-DUR) 10 MEQ tablet Take 1 tablet by mouth daily.    Marland Kitchen saccharomyces boulardii (FLORASTOR) 250 MG capsule Take 1 capsule (250 mg total) by mouth 2 (two) times daily. 60 capsule 3   No current facility-administered medications for this visit.      Past Medical History:  Diagnosis Date  . Alcohol abuse    PREVIOUS H/O DRINKING A FIFTH OF VODKA  A DAY  . Anginal pain (HCC)   . Arthritis    hands & back  . CHF (congestive heart failure) (HCC)   . Chronic kidney disease    acute renal  04/2012  . Chronic systolic heart failure (HCC)    LEFT  . Coronary artery disease   . History of angina   . Hypertension   . ICD (implantable cardiac defibrillator) in place   . LBBB (left bundle branch block)   . Nonischemic cardiomyopathy (HCC)    EF 20% s/p INSERTION OF BI-V DEFIBRILLATOR.  . Stroke (HCC)    TIA's  . Thyroid disease    HYPOTHYROIDISM.....TREATED  . TIA (transient ischemic attack)     ROS:   All systems reviewed and negative except as noted in the  HPI.   Past Surgical History:  Procedure Laterality Date  . CARDIAC CATHETERIZATION    . CYSTOSCOPY W/ STONE MANIPULATION    . FLEXIBLE SIGMOIDOSCOPY N/A 04/29/2012   Procedure: FLEXIBLE SIGMOIDOSCOPY;  Surgeon: Vertell NovakJames L Edwards Jr., MD;  [facility];  Service: Endoscopy;  Laterality: N/A;  . IMPLANTABLE CARDIOVERTER DEFIBRILLATOR (ICD) GENERATOR CHANGE Bilateral 12/25/2011   Procedure: ICD GENERATOR CHANGE;  Surgeon: Marinus MawGregg W Taylor, MD;  [facility];  Service: Cardiovascular;  Laterality: Bilateral;  . INSERT / REPLACE / REMOVE PACEMAKER     BI-V IMPLANTABLE CARDIOVERTER-DEFIBRILLATOR. ; MEDTRONIC CONCERTO; MODEL  # [id number], SERIAL # [id number] H.  DR. Humberto LeepJOHN H. EDMUNDS.  . TONSILLECTOMY    . VASECTOMY       Family History  Problem Relation Age of Onset  .  Heart disease Mother   . Heart disease Father   . Hypertension       Social History   Social History  . Marital status: Widowed    Spouse name: N/A  . Number of children: N/A  . Years of education: N/A   Occupational History  . RETIRED     SOCIAL WORKER   Social History Main Topics  . Smoking status: Former Smoker    Packs/day: 1.50    Years: 43.00    Quit date: 02/27/2012  . Smokeless tobacco: Never Used  . Alcohol use No     Comment: H/O ALCOHOLISM PERVIOUSLY A FIFTH OF VODKA A DAY  . Drug use: No  . Sexual activity: Not on file   Other Topics Concern  . Not on file   Social History Narrative   BI-V IMPLANTABLE CARDIOVERTER-DEFIBRILLATOR; MEDTRONIC CONCERTO, MODEL # [id number]; SERIAL # [id number] H;       RETIRED SOCIAL WORKER      MARRIED 4 TIMES      H/O ALCOHOLISM; A FIFTH OF VODKA DAILY IN THE PAST              BP (!) 136/102   Pulse 82   Ht 6' (1.829 m)   Wt 216 lb 3.2 oz (98.1 kg)   BMI 29.32 kg/m   Physical Exam:  Well appearing 70 year old man, NAD HEENT: Unremarkable Neck:  6 cm JVD, no thyromegally Back:  No CVA tenderness Lungs:  Clear with no wheezes, rales, or rhonchi. HEART:  Regular rate rhythm, no murmurs, no rubs, no clicks Abd:  soft, positive bowel sounds, no organomegally, no rebound, no guarding Ext:  2 plus pulses, no edema, no cyanosis, no clubbing Skin:  No rashes no nodules Neuro:  CN II through XII intact, motor grossly intact  ECG - NSR DEVICE  Normal device function.  See PaceArt for details.   Assess/Plan: 1. Chronic systolic heart failure - his symptoms are class 2. I have encouraged the patient to increase his physical activity. 2. VT - he has had multiple ICD shocks.  3. ICD - his Medtronic BiV ICD is working normally. Will recheck in several months. 4. Atrial fib - this is documented on his ICD. I have recommended he stop plavix and start coumadin.  Leonia ReevesGregg Taylor,M.D.

## 2015-10-29 NOTE — Patient Instructions (Addendum)
Medication Instructions:  Your physician has recommended you make the following change in your medication:   1) START Warfarin 3 mg a day.  2) STOP Plavix 2 days after starting Warfarin    Labwork: None Ordered   Testing/Procedures: None Ordered   Follow-Up: Follow-up on 11/05/15 with the [facility] and in 3 months with Dr. Ladona Ridgel.   Any Other Special Instructions Will Be Listed Below (If Applicable).     If you need a refill on your cardiac medications before your next appointment, please call your pharmacy.

## 2015-10-30 ENCOUNTER — Encounter: Payer: Self-pay | Admitting: *Deleted

## 2015-11-01 ENCOUNTER — Other Ambulatory Visit: Payer: Self-pay | Admitting: *Deleted

## 2015-11-01 DIAGNOSIS — I428 Other cardiomyopathies: Secondary | ICD-10-CM | POA: Insufficient documentation

## 2015-11-01 NOTE — Patient Outreach (Signed)
[facility]) Care Management  11/01/2015  Don [name] 1945/11/01 [id number]  RN spoke with pt today and verified pt continues to manage his HF and ongoing medical issues with no delay. Pt reports a weight today of 214 lbs and indicates previous swelling has resolved with no additional symptoms.  Verified pt remains in the GREEN zone once HF zones reviewed. Pt states he has visited Dr. Ladona Ridgel (CAD) for his ICD and some medication changes. Plavix stopped and pt started on Coumadin along with Lasix and other medications. RN continues to offer community case management and community resources however pt decline at this time with no additional needs. Pt continues to be receptive to ongoing transition of care calls and agrees to a follow up for the next few weeks. RN will contact pt next week and inquire further on pt's ongoing management of care.   [name], RN Care Management Coordinator [facility] Main Office [phone]

## 2015-11-05 ENCOUNTER — Other Ambulatory Visit: Payer: Self-pay | Admitting: *Deleted

## 2015-11-05 NOTE — Patient Outreach (Signed)
[facility]) Care Management  11/05/2015  ELIZANDRO MACISAAC 01/20/1946 [id number]   RN spoke with pt today who indicates no swelling or issues related to his HF. Pt continues to weight with no acute changes maintaining his dry weight. Pt states today he does not fell good with some "diarrhea". RN inquired and offered a home visit today however pt opt to declined. RN inquire if he felt he needed so see his provider as pt indicated he does not. RN encouraged pt ot stay hydrated and offered any other resources needed however pt opt to decline. RN offered to follow up next week however if needed sooner pt aware of how to contact this RN for consultation. Pt appreciative and receptive once again to a call next week (ongoing transition of care).  [name], RN Care Management Coordinator [facility] Main Office [phone]

## 2015-11-12 ENCOUNTER — Ambulatory Visit (INDEPENDENT_AMBULATORY_CARE_PROVIDER_SITE_OTHER): Payer: PPO

## 2015-11-12 DIAGNOSIS — I5022 Chronic systolic (congestive) heart failure: Secondary | ICD-10-CM

## 2015-11-12 DIAGNOSIS — Z95810 Presence of automatic (implantable) cardiac defibrillator: Secondary | ICD-10-CM

## 2015-11-12 NOTE — Progress Notes (Signed)
EPIC Encounter for ICM Monitoring  Patient Name: Don [name] is a 70 y.o. male Date: 11/12/2015 Primary Care Physican: [name], MD Primary Cardiologist: Ladona Ridgel Electrophysiologist: Ladona Ridgel Dry Weight: Does not weigh Bi-V Pacing:  90.6%        1st ICM encounter.  Heart Failure questions reviewed, pt asymptomatic.  Patient was hospitalized in August with diarrhea, dehydration and hypokalemia.  He was discharged 10/15/2015   Thoracic impedance abnormal suggesting fluid accumulation.  He reported he is drinking too much fluids.  Advised to limit to 2 liters a day and if drinking in excess the Furosemide is not able to keep the fluid from accumulating.   Patient activity 1.6 hour/day  LABS: 10/15/2015 Creatinine 0.83, BUN 10, Potassium 4.4, Sodium 141  10/14/2015 Creatinine 1.25, BUN 16, Potassium 3.8, Sodium 143 10/13/2015 Creatinine 3.54, BUN 24, Potassium 3.1, Sodium 140 10/12/2015 Creatinine 4.75, BUN 27, Potassium 3.1, Sodium 139 10/12/2015 Creatinine 6.39, BUN 31, Potassium 2.8, Sodium 137 09/20/2015 Creatinine 0.66, BUN <5, Potassium 4.1, Sodium 139  Recommendations: Will consult with Dr Ladona Ridgel in the office 11/13/2015  Follow-up plan: ICM clinic phone appointment on 12/18/2015.   ICM trend: 11/12/2015       Karie Soda, RN 11/12/2015 11:22 AM

## 2015-11-13 NOTE — Progress Notes (Signed)
Reviewed transmission with Dr Ladona Ridgel, no changes today and patient should call if he has any fluid symptoms.  Will continue to monitor.

## 2015-11-13 NOTE — Progress Notes (Signed)
Call to patient and left detailed message that Dr Ladona Ridgel did not recommend any changes at this time and to call if he develops any fluid symptoms.  Next ICM remote transmission scheduled for 12/18/2015.  Left ICM number to call in the event he has any fluid symptoms, questions or concerns.

## 2015-11-16 ENCOUNTER — Other Ambulatory Visit: Payer: Self-pay | Admitting: *Deleted

## 2015-11-16 NOTE — Patient Outreach (Signed)
[facility]) Care Management  11/16/2015  Don [name] March 21, 1945 [id number]   RN attempted outreach call to pt today however unsuccessful. RN able to leave HIPAA approved voice message requesting a call back. Will reschedule follow up call next week to inquire further on pt's management of care.  [name], RN Care Management Coordinator [facility] Main Office [phone]

## 2015-11-19 ENCOUNTER — Ambulatory Visit: Payer: Self-pay | Admitting: *Deleted

## 2015-11-19 ENCOUNTER — Ambulatory Visit (INDEPENDENT_AMBULATORY_CARE_PROVIDER_SITE_OTHER): Payer: PPO | Admitting: Pharmacist

## 2015-11-19 ENCOUNTER — Other Ambulatory Visit: Payer: Self-pay | Admitting: *Deleted

## 2015-11-19 DIAGNOSIS — I4891 Unspecified atrial fibrillation: Secondary | ICD-10-CM

## 2015-11-19 DIAGNOSIS — Z8673 Personal history of transient ischemic attack (TIA), and cerebral infarction without residual deficits: Secondary | ICD-10-CM

## 2015-11-19 DIAGNOSIS — Z5181 Encounter for therapeutic drug level monitoring: Secondary | ICD-10-CM

## 2015-11-19 LAB — POCT INR: INR: 1.5

## 2015-11-19 MED ORDER — WARFARIN SODIUM 3 MG PO TABS: 3.0000 mg | ORAL_TABLET | Freq: Every day | ORAL | 0 refills | Status: DC

## 2015-11-19 MED ORDER — NITROGLYCERIN 0.4 MG SL SUBL: 0.4000 mg | SUBLINGUAL_TABLET | SUBLINGUAL | 3 refills | Status: AC | PRN

## 2015-11-19 NOTE — Patient Outreach (Signed)
[facility]) Care Management  11/19/2015  Don [name] 07-Apr-1945 [id number]   RN attempted outreach call however unsuccessful. RN able to leave a HIPAA approved voice message requesting a call back. Will inquire further on pt's ongoing management of care via ongoing transition of care contacts.   [name], RN Care Management Coordinator [facility] Main Office [phone]

## 2015-11-20 ENCOUNTER — Other Ambulatory Visit: Payer: Self-pay | Admitting: Internal Medicine

## 2015-11-20 NOTE — Telephone Encounter (Signed)
Returned call to pharmacist advised Warfarin is a new rx for this pt, pt was seen yesterday and dosage of Warfarin was increased at OV and new rx was sent to pharmacy for #45 tablets instructions were not changed from 1 tablet daily advised pt is taking 1.5 tablets daily or as directed by Coumadin Clinic #45 tablets is a 30 day supply.

## 2015-11-20 NOTE — Telephone Encounter (Signed)
New message    Pharmacist calling about a conflict with the Warfarin rx. She states the she has 2 with different dosages and is not sure of which one to fill. Please call.

## 2015-11-23 ENCOUNTER — Other Ambulatory Visit: Payer: Self-pay | Admitting: *Deleted

## 2015-11-23 ENCOUNTER — Ambulatory Visit: Payer: Self-pay | Admitting: Nurse Practitioner

## 2015-11-23 NOTE — Patient Outreach (Signed)
[facility]) Care Management  11/23/2015  EMIGDIO DUTY Aug 15, 1945 [id number]   RN attempted to reach pt today however unsuccessful. RN able to leave a HIPAA approved voice message and will rescheduled another follow up call next week for ongoing transition of care.  [name], RN Care Management Coordinator [facility] Main Office [phone]

## 2015-11-26 ENCOUNTER — Other Ambulatory Visit: Payer: Self-pay | Admitting: *Deleted

## 2015-11-26 NOTE — Patient Outreach (Signed)
[facility]) Care Management  11/26/2015  GARRETH FLATTEN 1945/04/07 [id number]   RN attempted another outreach call however unsuccessful. RN left a HIPAA approved voice message and requested a call back. Will inquire further at that time on pt's ongoing management of his HF. Will rescheduled another follow up call of inquire for the last transition of care call.  [name], RN Care Management Coordinator [facility] Main Office [phone]

## 2015-11-29 ENCOUNTER — Encounter: Payer: Self-pay | Admitting: *Deleted

## 2015-11-29 ENCOUNTER — Other Ambulatory Visit: Payer: Self-pay | Admitting: *Deleted

## 2015-11-29 ENCOUNTER — Ambulatory Visit (INDEPENDENT_AMBULATORY_CARE_PROVIDER_SITE_OTHER): Payer: PPO

## 2015-11-29 DIAGNOSIS — I4891 Unspecified atrial fibrillation: Secondary | ICD-10-CM | POA: Diagnosis not present

## 2015-11-29 DIAGNOSIS — Z5181 Encounter for therapeutic drug level monitoring: Secondary | ICD-10-CM

## 2015-11-29 DIAGNOSIS — Z8673 Personal history of transient ischemic attack (TIA), and cerebral infarction without residual deficits: Secondary | ICD-10-CM

## 2015-11-29 LAB — POCT INR: INR: 2.3

## 2015-11-29 NOTE — Patient Outreach (Signed)
[facility]) Care Management  11/29/2015  Don [name] Jan 01, 1946 [id number]   RN attempted to follow up with pt today as the last transition of care contact if pt continue to manager his care well however pt was not available today as the third attempt to reach this pt. RN will send outreach letter and allow pt a call back with the allow 10 days. If no call back will complete a case closure and notify pt's provider accordingly.  [name], RN Care Management Coordinator [facility] Main Office [phone]

## 2015-12-03 ENCOUNTER — Ambulatory Visit: Payer: PPO | Admitting: Nurse Practitioner

## 2015-12-17 ENCOUNTER — Ambulatory Visit (INDEPENDENT_AMBULATORY_CARE_PROVIDER_SITE_OTHER): Payer: PPO | Admitting: *Deleted

## 2015-12-17 ENCOUNTER — Encounter (INDEPENDENT_AMBULATORY_CARE_PROVIDER_SITE_OTHER): Payer: Self-pay

## 2015-12-17 DIAGNOSIS — Z5181 Encounter for therapeutic drug level monitoring: Secondary | ICD-10-CM | POA: Diagnosis not present

## 2015-12-17 DIAGNOSIS — Z8673 Personal history of transient ischemic attack (TIA), and cerebral infarction without residual deficits: Secondary | ICD-10-CM | POA: Diagnosis not present

## 2015-12-17 DIAGNOSIS — I4891 Unspecified atrial fibrillation: Secondary | ICD-10-CM

## 2015-12-17 LAB — POCT INR: INR: 1.6

## 2015-12-18 ENCOUNTER — Ambulatory Visit (INDEPENDENT_AMBULATORY_CARE_PROVIDER_SITE_OTHER): Payer: PPO

## 2015-12-18 DIAGNOSIS — I5022 Chronic systolic (congestive) heart failure: Secondary | ICD-10-CM | POA: Diagnosis not present

## 2015-12-18 DIAGNOSIS — Z95810 Presence of automatic (implantable) cardiac defibrillator: Secondary | ICD-10-CM | POA: Diagnosis not present

## 2015-12-18 NOTE — Progress Notes (Signed)
EPIC Encounter for ICM Monitoring  Patient Name: Don [name] is a 70 y.o. male Date: 12/18/2015 Primary Care Physican: [name], MD Primary Cardiologist: Ladona Ridgel Electrophysiologist: Ladona Ridgel Dry Weight:    Does not weigh Bi-V Pacing:  89.26%        Heart Failure questions reviewed, pt asymptomatic.    Thoracic impedance abnormal suggesting fluid accumulation.  LABS: 10/15/2015 Creatinine 0.83, BUN 10, Potassium 4.4, Sodium 141  10/14/2015 Creatinine 1.25, BUN 16, Potassium 3.8, Sodium 143 10/13/2015 Creatinine 3.54, BUN 24, Potassium 3.1, Sodium 140 10/12/2015 Creatinine 4.75, BUN 27, Potassium 3.1, Sodium 139 10/12/2015 Creatinine 6.39, BUN 31, Potassium 2.8, Sodium 137 09/20/2015 Creatinine 0.66, BUN <5, Potassium 4.1, Sodium 139  Recommendations: Patient reported feeling depressed and drinking too much alcohol since about 1st of September.  Advise the dangers of drinking excessive amount of alcohol.  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  He plans on reach out to a friend for support and denied any suicidal ideations.  Offered to get any referrals or support group information if needed and he declined.  Per last office note with Dr Ladona Ridgel patient has a history of alcohol abuse.    Follow-up plan: ICM clinic phone appointment on 12/24/2015.  Copy of ICM check sent to device physician.   ICM trend: 12/18/2015       Karie Soda, RN 12/18/2015 4:03 PM

## 2015-12-19 ENCOUNTER — Other Ambulatory Visit: Payer: Self-pay | Admitting: Internal Medicine

## 2015-12-21 ENCOUNTER — Other Ambulatory Visit: Payer: Self-pay | Admitting: *Deleted

## 2015-12-21 ENCOUNTER — Encounter: Payer: Self-pay | Admitting: *Deleted

## 2015-12-21 NOTE — Patient Outreach (Signed)
[facility]) Care Management  12/21/2015  Don [name] October 19, 1945 [id number]   No response from pt after outreach letter sent. Case will be closed and primary provider will be notified.  [name], RN Care Management Coordinator [facility] Main Office [phone]

## 2015-12-24 ENCOUNTER — Telehealth: Payer: Self-pay | Admitting: Cardiology

## 2015-12-24 ENCOUNTER — Telehealth: Payer: Self-pay

## 2015-12-24 ENCOUNTER — Ambulatory Visit (INDEPENDENT_AMBULATORY_CARE_PROVIDER_SITE_OTHER): Payer: PPO

## 2015-12-24 DIAGNOSIS — Z95810 Presence of automatic (implantable) cardiac defibrillator: Secondary | ICD-10-CM

## 2015-12-24 DIAGNOSIS — I5022 Chronic systolic (congestive) heart failure: Secondary | ICD-10-CM

## 2015-12-24 NOTE — Progress Notes (Signed)
EPIC Encounter for ICM Monitoring  Patient Name: Don [name] is a 70 y.o. male Date: 12/24/2015 Primary Care Physican: [name], MD Primary Cardiologist:Taylor Electrophysiologist: Fuller Mandril Weight:Does not weigh Bi-V Pacing: 87.8%            Heart Failure questions reviewed, pt asymptomatic for fluid symptoms.  Thoracic impedance returned to normal after increasing Furosemide 20 mg to 1 tablet bid x 3 days and Potassium 10 mEq 1 tablet bid x 3 days as directed on 10/31.   LABS: 10/15/2015 Creatinine 0.83, BUN 10, Potassium 4.4, Sodium 141  10/14/2015 Creatinine 1.25, BUN 16, Potassium 3.8, Sodium 143 10/13/2015 Creatinine 3.54, BUN 24, Potassium 3.1, Sodium 140 10/12/2015 Creatinine 4.75, BUN 27, Potassium 3.1, Sodium 139 10/12/2015 Creatinine 6.39, BUN 31, Potassium 2.8, Sodium 137 09/20/2015 Creatinine 0.66, BUN <5, Potassium 4.1, Sodium 139  Recommendations:   Patient reported a fall last week resulting in open area on leg and he thinks fractured ribs.  He reported being in a lot of pain and the leg wound is red and looks infected.  Advised he needs to contact PCP, use 911 or hospital for evaluation of injuries.  He agreed to do so.  He forgot to take his medical alert and cell phone outside with him and when he fell it was difficult to get up.  Instructed to keep both of those with him at all times.       Follow-up plan: ICM clinic phone appointment on 01/23/2016.  Copy of ICM check sent to PCP and device physician.   ICM trend: 12/24/2015       Karie Soda, RN 12/24/2015 3:16 PM

## 2015-12-24 NOTE — Telephone Encounter (Signed)
Spoke with pt and reminded pt of remote transmission that is due today. Pt verbalized understanding.   

## 2015-12-24 NOTE — Telephone Encounter (Signed)
Spoke with patient.. See ICM note 

## 2015-12-24 NOTE — Telephone Encounter (Signed)
Follow Up:    He wanted you to know he did send his transmission.

## 2015-12-28 ENCOUNTER — Emergency Department (HOSPITAL_COMMUNITY): Payer: PPO

## 2015-12-28 ENCOUNTER — Encounter (HOSPITAL_COMMUNITY): Payer: Self-pay | Admitting: Nurse Practitioner

## 2015-12-28 ENCOUNTER — Emergency Department (HOSPITAL_COMMUNITY)
Admission: EM | Admit: 2015-12-28 | Discharge: 2015-12-28 | Disposition: A | Discharge status: Home / Self Care | Payer: PPO | Attending: Emergency Medicine | Admitting: Emergency Medicine

## 2015-12-28 DIAGNOSIS — I2510 Atherosclerotic heart disease of native coronary artery without angina pectoris: Secondary | ICD-10-CM | POA: Insufficient documentation

## 2015-12-28 DIAGNOSIS — Z79899 Other long term (current) drug therapy: Secondary | ICD-10-CM | POA: Insufficient documentation

## 2015-12-28 DIAGNOSIS — I5042 Chronic combined systolic (congestive) and diastolic (congestive) heart failure: Secondary | ICD-10-CM | POA: Insufficient documentation

## 2015-12-28 DIAGNOSIS — Z4800 Encounter for change or removal of nonsurgical wound dressing: Secondary | ICD-10-CM | POA: Diagnosis not present

## 2015-12-28 DIAGNOSIS — Z87891 Personal history of nicotine dependence: Secondary | ICD-10-CM | POA: Diagnosis not present

## 2015-12-28 DIAGNOSIS — Z7901 Long term (current) use of anticoagulants: Secondary | ICD-10-CM | POA: Diagnosis not present

## 2015-12-28 DIAGNOSIS — E039 Hypothyroidism, unspecified: Secondary | ICD-10-CM | POA: Insufficient documentation

## 2015-12-28 DIAGNOSIS — R0781 Pleurodynia: Secondary | ICD-10-CM

## 2015-12-28 DIAGNOSIS — W01198D Fall on same level from slipping, tripping and stumbling with subsequent striking against other object, subsequent encounter: Secondary | ICD-10-CM | POA: Diagnosis not present

## 2015-12-28 DIAGNOSIS — L03115 Cellulitis of right lower limb: Secondary | ICD-10-CM

## 2015-12-28 DIAGNOSIS — S2242XA Multiple fractures of ribs, left side, initial encounter for closed fracture: Secondary | ICD-10-CM | POA: Diagnosis not present

## 2015-12-28 DIAGNOSIS — N189 Chronic kidney disease, unspecified: Secondary | ICD-10-CM | POA: Insufficient documentation

## 2015-12-28 DIAGNOSIS — I130 Hypertensive heart and chronic kidney disease with heart failure and stage 1 through stage 4 chronic kidney disease, or unspecified chronic kidney disease: Secondary | ICD-10-CM | POA: Diagnosis not present

## 2015-12-28 DIAGNOSIS — Z8673 Personal history of transient ischemic attack (TIA), and cerebral infarction without residual deficits: Secondary | ICD-10-CM | POA: Diagnosis not present

## 2015-12-28 DIAGNOSIS — S80811A Abrasion, right lower leg, initial encounter: Secondary | ICD-10-CM | POA: Diagnosis not present

## 2015-12-28 DIAGNOSIS — Z7982 Long term (current) use of aspirin: Secondary | ICD-10-CM | POA: Insufficient documentation

## 2015-12-28 LAB — I-STAT CHEM 8, ED
BUN: 21 mg/dL — ABNORMAL HIGH (ref 6–20)
CREATININE: 0.9 mg/dL (ref 0.61–1.24)
Calcium, Ion: 1.07 mmol/L — ABNORMAL LOW (ref 1.15–1.40)
Chloride: 99 mmol/L — ABNORMAL LOW (ref 101–111)
Glucose, Bld: 120 mg/dL — ABNORMAL HIGH (ref 65–99)
HEMATOCRIT: 40 % (ref 39.0–52.0)
HEMOGLOBIN: 13.6 g/dL (ref 13.0–17.0)
Potassium: 3.7 mmol/L (ref 3.5–5.1)
Sodium: 140 mmol/L (ref 135–145)
TCO2: 30 mmol/L (ref 0–100)

## 2015-12-28 MED ORDER — HYDROCODONE-ACETAMINOPHEN 5-325 MG PO TABS
1.0000 | ORAL_TABLET | Freq: Once | ORAL | Status: AC
  Administered 2015-12-28: 1 via ORAL
  Filled 2015-12-28: qty 1

## 2015-12-28 NOTE — Discharge Instructions (Signed)
Continue taking the antibiotics that were prescribed to you at urgent care. If he continued to have worsening pain, redness, swelling or pus draining from your wound despite taking her antibiotics as directed, please return back to the emergency department. If you develop a fever despite being on antibiotics, please return back to emergency department or your primary care physician.  Continue using the incentive spirometer as directed. If you start developing cough, fever, chills, please return back to emergency department

## 2015-12-28 NOTE — ED Provider Notes (Signed)
[facility]-EMERGENCY DEPT Provider Note   CSN: [id number] Arrival date & time: 12/28/15  1359  History   Chief Complaint Chief Complaint  Patient presents with  . Rib Fracture  . Wound Check    HPI Don [name] is a 70 y.o. male.   Trauma Mechanism of injury: fall Injury location: torso Injury location detail: L chest Incident location: outdoors Time since incident: 12 days Arrived directly from scene: no   Fall:      Fall occurred: tripped      Impact surface: concrete  EMS/PTA data:      Blood loss: none      Responsiveness: alert      Oriented to: person, place, situation and time      Loss of consciousness: no  Current symptoms:      Associated symptoms:            Denies chest pain, loss of consciousness, nausea, neck pain and vomiting.   Past Medical History:  Diagnosis Date  . Alcohol abuse    PREVIOUS H/O DRINKING A FIFTH OF VODKA  A DAY  . Anginal pain (HCC)   . Arthritis    hands & back  . CHF (congestive heart failure) (HCC)   . Chronic kidney disease    acute renal  04/2012  . Chronic systolic heart failure (HCC)    LEFT  . Coronary artery disease   . History of angina   . Hypertension   . ICD (implantable cardiac defibrillator) in place   . LBBB (left bundle branch block)   . Nonischemic cardiomyopathy (HCC)    EF 20% s/p INSERTION OF BI-V DEFIBRILLATOR.  . Stroke (HCC)    TIA's  . Thyroid disease    HYPOTHYROIDISM.....TREATED  . TIA (transient ischemic attack)    Patient Active Problem List   Diagnosis Date Noted  . Encounter for therapeutic drug monitoring 11/19/2015  . Atrial fibrillation (HCC) [I48.91] 11/19/2015  . Thrombocytopenia (HCC) 10/15/2015  . Sepsis (HCC) 10/12/2015  . Pain in scapula 10/12/2015  . Collagenous colitis 10/12/2015  . Dizziness and giddiness   . History of stroke 09/18/2015  . HLD (hyperlipidemia) 09/18/2015  . Hypothyroid 09/18/2015  . Essential hypertension 09/18/2015  . Tobacco use 09/18/2015    . 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 Hypocalcemia 02/13/2014  . Lymphadenopathy 09/28/2013  . Transient cerebral ischemia 08/16/2012  . Hemorrhoids with complication 04/29/2012  . Acute renal failure (HCC) 04/26/2012  . Hypovolemic shock (HCC) 04/26/2012  . Rectal bleeding 04/26/2012  . Diarrhea 04/26/2012  . Hypokalemia 04/26/2012  . Right rib fracture 04/26/2012  . Fall 04/26/2012  . Dehydration 04/26/2012  . ETOH abuse   . Stroke (HCC)   . 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   . Biventricular implantable cardioverter-defibrillator in situ 07/10/2010  . Chronic combined systolic (congestive) and diastolic (congestive) heart failure 07/10/2010  . Ventricular tachycardia (HCC) 07/10/2010    Past Surgical History:  Procedure Laterality Date  . CARDIAC CATHETERIZATION    . CYSTOSCOPY W/ STONE MANIPULATION    . FLEXIBLE SIGMOIDOSCOPY N/A 04/29/2012   Procedure: FLEXIBLE SIGMOIDOSCOPY;  Surgeon: Vertell Novak., MD;  Location: [facility] ENDOSCOPY;  Service: Endoscopy;  Laterality: N/A;  . IMPLANTABLE CARDIOVERTER DEFIBRILLATOR (ICD) GENERATOR CHANGE Bilateral 12/25/2011   Procedure: ICD GENERATOR CHANGE;  Surgeon: Marinus Maw, MD;  Location: [facility] CATH LAB;  Service: Cardiovascular;  Laterality: Bilateral;  . INSERT / REPLACE / REMOVE PACEMAKER     BI-V IMPLANTABLE CARDIOVERTER-DEFIBRILLATOR. ;  [employer]CERT[employer]; MODEL  # [id number], SERIAL # [id number] H.  DR. Humberto LeepJOHN H. EDMUNDS.  . TONSILLECTOMY    . VASECTOMY         Home Medications    Prior to Admission medications   Medication Sig Start Date End Date Taking? Authorizing Provider  allopurinol (ZYLOPRIM) 100 MG tablet Take 100 mg by mouth daily.    Yes Historical Provider, MD  aspirin 81 MG tablet Take 1 tablet (81 mg total) by mouth daily. Patient taking differently: Take 81 mg by mouth See admin instructions.  09/20/15  Yes [name], MD  atorvastatin (LIPITOR) 40 MG tablet Take 40 mg by mouth  daily.  09/03/11  Yes Historical Provider, MD  benazepril (LOTENSIN) 10 MG tablet Take 1 tablet by mouth daily. 07/30/15  Yes Historical Provider, MD  budesonide (ENTOCORT EC) 3 MG 24 hr capsule Take 3 capsules (9 mg total) by mouth daily. 10/15/15  Yes Renae FickleMackenzie Short, MD  calcium carbonate (TUMS - DOSED IN MG ELEMENTAL CALCIUM) 500 MG chewable tablet Chew 1 tablet by mouth daily.   Yes Historical Provider, MD  carvedilol (COREG) 6.25 MG tablet Take 6.25 mg by mouth 2 (two) times daily with a meal.   Yes Historical Provider, MD  Cholecalciferol 4000 UNITS CAPS Take 1 capsule by mouth daily.   Yes Historical Provider, MD  clindamycin (CLEOCIN) 300 MG capsule Take 300 mg by mouth 3 (three) times daily.   Yes Historical Provider, MD  furosemide (LASIX) 20 MG tablet Take 1 tablet by mouth daily. 10/25/15  Yes Historical Provider, MD  HYDROcodone-acetaminophen (NORCO/VICODIN) 5-325 MG tablet Take 1 tablet by mouth every 6 (six) hours as needed for moderate pain or severe pain.   Yes Historical Provider, MD  levothyroxine (SYNTHROID, LEVOTHROID) 50 MCG tablet Take 1 tablet (50 mcg total) by mouth daily before breakfast. 10/15/15  Yes Renae FickleMackenzie Short, MD  mupirocin ointment (BACTROBAN) 2 % Place 1 application into the nose 2 (two) times daily.   Yes Historical Provider, MD  OVER THE COUNTER MEDICATION Take 1 tablet by mouth 2 (two) times daily. Multi-allergy tablet   Yes Historical Provider, MD  potassium chloride (K-DUR) 10 MEQ tablet Take 1 tablet by mouth daily. 10/03/15  Yes Historical Provider, MD  warfarin (COUMADIN) 3 MG tablet Take 3 mg by mouth daily. Sunday tuesdays and Thursday  take 4.5, then take 3mg  rest of days   Yes Historical Provider, MD  nitroGLYCERIN (NITROSTAT) 0.4 MG SL tablet Place 1 tablet (0.4 mg total) under the tongue every 5 (five) minutes as needed for chest pain. 11/19/15   Marinus MawGregg W Taylor, MD  NONFORMULARY OR COMPOUNDED ITEM Outpatient PT, eval and treat Indication:  Syncope.   Orthostatic hypotension.  Generalized weakness. Patient not taking: Reported on 12/28/2015 10/15/15   Renae FickleMackenzie Short, MD  saccharomyces boulardii (FLORASTOR) 250 MG capsule Take 1 capsule (250 mg total) by mouth 2 (two) times daily. Patient not taking: Reported on 12/28/2015 10/15/15   Renae FickleMackenzie Short, MD  warfarin (COUMADIN) 3 MG tablet Take as directed by [facility] Patient not taking: Reported on 12/28/2015 12/19/15   Marinus MawGregg W Taylor, MD    Family History Family History  Problem Relation Age of Onset  . Heart disease Mother   . Heart disease Father   . Hypertension      Social History Social History  Substance Use Topics  . Smoking status: Former Smoker    Packs/day: 1.50    Years: 43.00    Quit date: 02/27/2012  .  Smokeless tobacco: Never Used  . Alcohol use No     Comment: H/O ALCOHOLISM PERVIOUSLY A FIFTH OF VODKA A DAY     Allergies   Sulfa antibiotics   Review of Systems Review of Systems  Constitutional: Positive for chills. Negative for fever.  Respiratory: Positive for cough and shortness of breath.   Cardiovascular: Negative for chest pain.  Gastrointestinal: Negative for nausea and vomiting.  Musculoskeletal: Negative for neck pain.  Neurological: Positive for light-headedness. Negative for loss of consciousness.  All other systems reviewed and are negative.    Physical Exam Updated Vital Signs BP 118/81 (BP Location: Left Arm)   Pulse 80   Temp 97.8 F (36.6 C) (Oral)   Resp 18   SpO2 99%   Physical Exam  Constitutional: He is oriented to person, place, and time. He appears well-developed and well-nourished. No distress.  HENT:  Head: Normocephalic and atraumatic.  Eyes: Pupils are equal, round, and reactive to light.  Neck: Normal range of motion.  Cardiovascular: Normal rate.   Pulmonary/Chest: Effort normal. No respiratory distress. He has no wheezes. He has no rales. He exhibits tenderness.  Left chest wall tenderness  Abdominal: Soft.  He exhibits no distension. There is no tenderness. There is no guarding.  Musculoskeletal: Normal range of motion. He exhibits no edema.  Right lower extremity wound on anterior aspect. Some erythema surrounding, no induration.  Neurological: He is alert and oriented to person, place, and time. No cranial nerve deficit. Coordination normal.  Skin: Skin is warm. Capillary refill takes less than 2 seconds. He is not diaphoretic.  Psychiatric: He has a normal mood and affect. His behavior is normal.  Vitals reviewed.    ED Treatments / Results  Labs (all labs ordered are listed, but only abnormal results are displayed) Labs Reviewed - No data to display  EKG  EKG Interpretation None      Radiology No results found.  Procedures Procedures (including critical care time)  Medications Ordered in ED Medications - No data to display  Initial Impression / Assessment and Plan / ED Course  I have reviewed the triage vital signs and the nursing notes.  Pertinent labs & imaging results that were available during my care of the patient were reviewed by me and considered in my medical decision making (see chart for details).  Clinical Course    Patient is a pleasant 70 year old male who presents emergency department today 12 days after a fall from standing location where he slipped. Patient hit the curb with his left chest. At the time did not lose consciousness. Has no other pain other than his right leg where he has an infected abrasion.  Patient was seen at urgent care and diagnosed with 3 rib fractures. He is given hydrocodone and clindamycin for antibiotics for his cellulitis. He is taken one dose of clindamycin at this time. Patient's pain is well-controlled when he takes hydrocodone.  Patient's wound will be cleaned here and dressed.    Likely will need twice a day wet-to-dry dressings initially and clindamycin. Patient will follow up with his primary care physician.  Patient will  be given incentive spirometer. Chest x-ray shows no acute fractures. Possible contusions.   Believe patient can go home with PO antibiotics with close PCP follow up.   Patient in agreement with plan and discharged home.   Final Clinical Impressions(s) / ED Diagnoses   Final diagnoses:  Cellulitis of right lower extremity  Rib pain on left side  Deirdre Peer, MD 12/29/15 [id number]    Shaune Pollack, MD 12/29/15 [phone]

## 2015-12-28 NOTE — ED Triage Notes (Addendum)
Pt presents with c/o rib fractures. He was sent from an urgent cARE after haVING AN  xray that showed L 8th, 9th, 10th rib fractures. He fell 12 days ago and has had L rib pain since. He also has an abrasion to RLE that urgent care prescribed oral antibiotic and referral to wound care for. He c/o severe rib pain and some SOB. He has history of CHF and is SOB at baseline, does not feel that this is worse today. He Is alert and able to speak in complete sentences now

## 2015-12-29 NOTE — ED Provider Notes (Signed)
Medical screening examination/treatment/procedure(s) were conducted as a shared visit with resident and myself.  I personally evaluated the patient during the encounter. See separately dictated note for further details.    EKG Interpretation None            Shaune Pollack, MD 12/29/15 [phone]

## 2016-01-20 ENCOUNTER — Emergency Department (HOSPITAL_COMMUNITY): Payer: PPO

## 2016-01-20 ENCOUNTER — Inpatient Hospital Stay (HOSPITAL_COMMUNITY)
Admission: EM | Admit: 2016-01-20 | Discharge: 2016-01-21 | DRG: 065 | Disposition: A | Discharge status: Home / Self Care | Payer: PPO | Attending: Internal Medicine | Admitting: Internal Medicine

## 2016-01-20 ENCOUNTER — Encounter (HOSPITAL_COMMUNITY): Payer: Self-pay

## 2016-01-20 DIAGNOSIS — M1990 Unspecified osteoarthritis, unspecified site: Secondary | ICD-10-CM | POA: Diagnosis not present

## 2016-01-20 DIAGNOSIS — R29701 NIHSS score 1: Secondary | ICD-10-CM | POA: Diagnosis present

## 2016-01-20 DIAGNOSIS — I6340 Cerebral infarction due to embolism of unspecified cerebral artery: Principal | ICD-10-CM | POA: Diagnosis present

## 2016-01-20 DIAGNOSIS — I4891 Unspecified atrial fibrillation: Secondary | ICD-10-CM

## 2016-01-20 DIAGNOSIS — Z8249 Family history of ischemic heart disease and other diseases of the circulatory system: Secondary | ICD-10-CM

## 2016-01-20 DIAGNOSIS — Z6828 Body mass index (BMI) 28.0-28.9, adult: Secondary | ICD-10-CM

## 2016-01-20 DIAGNOSIS — I2510 Atherosclerotic heart disease of native coronary artery without angina pectoris: Secondary | ICD-10-CM | POA: Diagnosis not present

## 2016-01-20 DIAGNOSIS — I639 Cerebral infarction, unspecified: Secondary | ICD-10-CM | POA: Diagnosis not present

## 2016-01-20 DIAGNOSIS — I5022 Chronic systolic (congestive) heart failure: Secondary | ICD-10-CM | POA: Diagnosis present

## 2016-01-20 DIAGNOSIS — G8321 Monoplegia of upper limb affecting right dominant side: Secondary | ICD-10-CM | POA: Diagnosis not present

## 2016-01-20 DIAGNOSIS — Z95810 Presence of automatic (implantable) cardiac defibrillator: Secondary | ICD-10-CM

## 2016-01-20 DIAGNOSIS — I429 Cardiomyopathy, unspecified: Secondary | ICD-10-CM | POA: Diagnosis present

## 2016-01-20 DIAGNOSIS — I130 Hypertensive heart and chronic kidney disease with heart failure and stage 1 through stage 4 chronic kidney disease, or unspecified chronic kidney disease: Secondary | ICD-10-CM | POA: Diagnosis present

## 2016-01-20 DIAGNOSIS — Z882 Allergy status to sulfonamides status: Secondary | ICD-10-CM

## 2016-01-20 DIAGNOSIS — I480 Paroxysmal atrial fibrillation: Secondary | ICD-10-CM | POA: Diagnosis not present

## 2016-01-20 DIAGNOSIS — Z9889 Other specified postprocedural states: Secondary | ICD-10-CM | POA: Diagnosis not present

## 2016-01-20 DIAGNOSIS — Z7901 Long term (current) use of anticoagulants: Secondary | ICD-10-CM

## 2016-01-20 DIAGNOSIS — G459 Transient cerebral ischemic attack, unspecified: Secondary | ICD-10-CM | POA: Diagnosis not present

## 2016-01-20 DIAGNOSIS — E785 Hyperlipidemia, unspecified: Secondary | ICD-10-CM | POA: Diagnosis not present

## 2016-01-20 DIAGNOSIS — Z87891 Personal history of nicotine dependence: Secondary | ICD-10-CM

## 2016-01-20 DIAGNOSIS — Z79899 Other long term (current) drug therapy: Secondary | ICD-10-CM

## 2016-01-20 DIAGNOSIS — E663 Overweight: Secondary | ICD-10-CM | POA: Diagnosis present

## 2016-01-20 DIAGNOSIS — Z8673 Personal history of transient ischemic attack (TIA), and cerebral infarction without residual deficits: Secondary | ICD-10-CM | POA: Diagnosis not present

## 2016-01-20 DIAGNOSIS — R791 Abnormal coagulation profile: Secondary | ICD-10-CM | POA: Diagnosis not present

## 2016-01-20 DIAGNOSIS — I10 Essential (primary) hypertension: Secondary | ICD-10-CM | POA: Diagnosis not present

## 2016-01-20 DIAGNOSIS — R29810 Facial weakness: Secondary | ICD-10-CM | POA: Diagnosis present

## 2016-01-20 DIAGNOSIS — N189 Chronic kidney disease, unspecified: Secondary | ICD-10-CM | POA: Diagnosis present

## 2016-01-20 DIAGNOSIS — Z7982 Long term (current) use of aspirin: Secondary | ICD-10-CM | POA: Diagnosis not present

## 2016-01-20 DIAGNOSIS — R51 Headache: Secondary | ICD-10-CM | POA: Diagnosis not present

## 2016-01-20 DIAGNOSIS — I6789 Other cerebrovascular disease: Secondary | ICD-10-CM | POA: Diagnosis not present

## 2016-01-20 DIAGNOSIS — E039 Hypothyroidism, unspecified: Secondary | ICD-10-CM | POA: Diagnosis present

## 2016-01-20 LAB — CBC
HEMATOCRIT: 40.7 % (ref 39.0–52.0)
Hemoglobin: 13.7 g/dL (ref 13.0–17.0)
MCH: 31.4 pg (ref 26.0–34.0)
MCHC: 33.7 g/dL (ref 30.0–36.0)
MCV: 93.1 fL (ref 78.0–100.0)
PLATELETS: 175 10*3/uL (ref 150–400)
RBC: 4.37 MIL/uL (ref 4.22–5.81)
RDW: 16.5 % — AB (ref 11.5–15.5)
WBC: 6.4 10*3/uL (ref 4.0–10.5)

## 2016-01-20 LAB — DIFFERENTIAL
BASOS PCT: 1 %
Basophils Absolute: 0 10*3/uL (ref 0.0–0.1)
EOS PCT: 2 %
Eosinophils Absolute: 0.1 10*3/uL (ref 0.0–0.7)
Lymphocytes Relative: 21 %
Lymphs Abs: 1.4 10*3/uL (ref 0.7–4.0)
MONO ABS: 0.7 10*3/uL (ref 0.1–1.0)
MONOS PCT: 12 %
Neutro Abs: 4.1 10*3/uL (ref 1.7–7.7)
Neutrophils Relative %: 64 %

## 2016-01-20 LAB — COMPREHENSIVE METABOLIC PANEL
ALK PHOS: 102 U/L (ref 38–126)
ALT: 19 U/L (ref 17–63)
ANION GAP: 9 (ref 5–15)
AST: 39 U/L (ref 15–41)
Albumin: 3 g/dL — ABNORMAL LOW (ref 3.5–5.0)
BUN: 15 mg/dL (ref 6–20)
CALCIUM: 8.5 mg/dL — AB (ref 8.9–10.3)
CHLORIDE: 107 mmol/L (ref 101–111)
CO2: 25 mmol/L (ref 22–32)
CREATININE: 0.92 mg/dL (ref 0.61–1.24)
Glucose, Bld: 96 mg/dL (ref 65–99)
Potassium: 3.9 mmol/L (ref 3.5–5.1)
SODIUM: 141 mmol/L (ref 135–145)
Total Bilirubin: 0.2 mg/dL — ABNORMAL LOW (ref 0.3–1.2)
Total Protein: 6.1 g/dL — ABNORMAL LOW (ref 6.5–8.1)

## 2016-01-20 LAB — APTT: aPTT: 37 seconds — ABNORMAL HIGH (ref 24–36)

## 2016-01-20 LAB — I-STAT CHEM 8, ED
BUN: 18 mg/dL (ref 6–20)
CALCIUM ION: 1.01 mmol/L — AB (ref 1.15–1.40)
CHLORIDE: 103 mmol/L (ref 101–111)
Creatinine, Ser: 1 mg/dL (ref 0.61–1.24)
Glucose, Bld: 97 mg/dL (ref 65–99)
HCT: 42 % (ref 39.0–52.0)
HEMOGLOBIN: 14.3 g/dL (ref 13.0–17.0)
Potassium: 3.9 mmol/L (ref 3.5–5.1)
SODIUM: 142 mmol/L (ref 135–145)
TCO2: 25 mmol/L (ref 0–100)

## 2016-01-20 LAB — I-STAT TROPONIN, ED: TROPONIN I, POC: 0.01 ng/mL (ref 0.00–0.08)

## 2016-01-20 LAB — PROTIME-INR
INR: 1.84
PROTHROMBIN TIME: 21.5 s — AB (ref 11.4–15.2)

## 2016-01-20 LAB — ETHANOL

## 2016-01-20 MED ORDER — FUROSEMIDE 20 MG PO TABS
20.0000 mg | ORAL_TABLET | Freq: Every day | ORAL | Status: DC
  Administered 2016-01-21: 20 mg via ORAL
  Filled 2016-01-20 (×2): qty 1

## 2016-01-20 MED ORDER — CARVEDILOL 6.25 MG PO TABS
6.2500 mg | ORAL_TABLET | Freq: Two times a day (BID) | ORAL | Status: DC
  Administered 2016-01-21: 6.25 mg via ORAL
  Filled 2016-01-20: qty 1

## 2016-01-20 MED ORDER — ATORVASTATIN CALCIUM 40 MG PO TABS
40.0000 mg | ORAL_TABLET | Freq: Every day | ORAL | Status: DC
  Administered 2016-01-21: 40 mg via ORAL
  Filled 2016-01-20: qty 1

## 2016-01-20 MED ORDER — WARFARIN - PHARMACIST DOSING INPATIENT: Freq: Every day | Status: DC

## 2016-01-20 MED ORDER — LEVOTHYROXINE SODIUM 50 MCG PO TABS
50.0000 ug | ORAL_TABLET | Freq: Every day | ORAL | Status: DC
  Administered 2016-01-21: 50 ug via ORAL
  Filled 2016-01-20: qty 1

## 2016-01-20 MED ORDER — STROKE: EARLY STAGES OF RECOVERY BOOK
Freq: Once | Status: DC
  Filled 2016-01-20: qty 1

## 2016-01-20 MED ORDER — ASPIRIN 81 MG PO CHEW
81.0000 mg | CHEWABLE_TABLET | ORAL | Status: DC
  Administered 2016-01-21: 81 mg via ORAL
  Filled 2016-01-20: qty 1

## 2016-01-20 MED ORDER — ALLOPURINOL 100 MG PO TABS
100.0000 mg | ORAL_TABLET | Freq: Every day | ORAL | Status: DC
  Administered 2016-01-21: 100 mg via ORAL
  Filled 2016-01-20: qty 1

## 2016-01-20 MED ORDER — BENAZEPRIL HCL 10 MG PO TABS
10.0000 mg | ORAL_TABLET | Freq: Every day | ORAL | Status: DC
  Administered 2016-01-21: 10 mg via ORAL
  Filled 2016-01-20: qty 1

## 2016-01-20 MED ORDER — POTASSIUM CHLORIDE CRYS ER 10 MEQ PO TBCR
10.0000 meq | EXTENDED_RELEASE_TABLET | ORAL | Status: DC
  Administered 2016-01-21: 10 meq via ORAL
  Filled 2016-01-20: qty 1

## 2016-01-20 MED ORDER — WARFARIN SODIUM 2 MG PO TABS
2.0000 mg | ORAL_TABLET | ORAL | Status: AC
Start: 2016-01-20 — End: 2016-01-20
  Administered 2016-01-20: 2 mg via ORAL
  Filled 2016-01-20: qty 1

## 2016-01-20 NOTE — ED Triage Notes (Signed)
Pt arrived c/o stroke like symptoms.  Right facial numbness, right leg numbness, right facial droop.  Mild blurred vision.  Daughter at bedside.

## 2016-01-20 NOTE — Consult Note (Signed)
Admission H&P    Chief Complaint: acute left facial and upper extremity weakness and numbness.  HPI: Don [name] is an 70 y.o. male with a history of atrial fibrillation, nonischemic cardiomyopathy, TIA and CHF, brought to the ED following acute onset ofright facial and upper extremity weakness and numbness. Patient has been on anticoagulation with Coumadin. INR was 1.84. Deficits improved rapidly. Patient still had mild facial numbness and slight right facial droop at the time of this evaluation. CT scan of his head showed no acute intracranial abnormality. NIH stroke score was 1.  LSN: 7:45 PM on 01/20/2016 tPA Given: No: deficits rapidly resolving mRankin:  Past Medical History:  Diagnosis Date  . Alcohol abuse    PREVIOUS H/O DRINKING A FIFTH OF VODKA  A DAY  . Anginal pain ([place])   . Arthritis    hands & back  . CHF (congestive heart failure) ([place])   . Chronic kidney disease    acute renal  04/2012  . Chronic systolic heart failure (HCC)    LEFT  . Coronary artery disease   . History of angina   . Hypertension   . ICD (implantable cardiac defibrillator) in place   . LBBB (left bundle branch block)   . Nonischemic cardiomyopathy (HCC)    EF 20% s/p INSERTION OF BI-V DEFIBRILLATOR.  . Stroke ([place])    TIA's  . Thyroid disease    HYPOTHYROIDISM.....TREATED  . TIA (transient ischemic attack)     Past Surgical History:  Procedure Laterality Date  . CARDIAC CATHETERIZATION    . CYSTOSCOPY W/ STONE MANIPULATION    . FLEXIBLE SIGMOIDOSCOPY N/A 04/29/2012   Procedure: FLEXIBLE SIGMOIDOSCOPY;  Surgeon: Winfield Cunas., MD;  Location: [facility] ENDOSCOPY;  Service: Endoscopy;  Laterality: N/A;  . IMPLANTABLE CARDIOVERTER DEFIBRILLATOR (ICD) GENERATOR CHANGE Bilateral 12/25/2011   Procedure: ICD GENERATOR CHANGE;  Surgeon: Evans Lance, MD;  Location: [facility] CATH LAB;  Service: Cardiovascular;  Laterality: Bilateral;  . INSERT / REPLACE / REMOVE PACEMAKER     BI-V IMPLANTABLE  CARDIOVERTER-DEFIBRILLATOR. ; [employer] CONCERTO; MODEL  # [id number], SERIAL # [id number] H.  DR. Henry    . VASECTOMY      Family History  Problem Relation Age of Onset  . Heart disease Mother   . Heart disease Father   . Hypertension     Social History:  reports that he quit smoking about 3 years ago. He has a 64.50 pack-year smoking history. He has never used smokeless tobacco. He reports that he does not drink alcohol or use drugs.  Allergies:  Allergies  Allergen Reactions  . Sulfa Antibiotics Nausea And Vomiting    Medications: Preadmission medications were reviewed by me.  ROS: History obtained from the patient  General ROS: negative for - chills, fatigue, fever, night sweats, weight gain or weight loss Psychological ROS: negative for - behavioral disorder, hallucinations, memory difficulties, mood swings or suicidal ideation Ophthalmic ROS: negative for - blurry vision, double vision, eye pain or loss of vision ENT ROS: negative for - epistaxis, nasal discharge, oral lesions, sore throat, tinnitus or vertigo Allergy and Immunology ROS: negative for - hives or itchy/watery eyes Hematological and Lymphatic ROS: negative for - bleeding problems, bruising or swollen lymph nodes Endocrine ROS: negative for - galactorrhea, hair pattern changes, polydipsia/polyuria or temperature intolerance Respiratory ROS: negative for - cough, hemoptysis, shortness of breath or wheezing Cardiovascular ROS: negative for - chest pain, dyspnea on exertion, edema or irregular heartbeat Gastrointestinal  ROS: negative for - abdominal pain, diarrhea, hematemesis, nausea/vomiting or stool incontinence Genito-Urinary ROS: negative for - dysuria, hematuria, incontinence or urinary frequency/urgency Musculoskeletal ROS: negative for - joint swelling or muscular weakness Neurological ROS: as noted in HPI Dermatological ROS: negative for rash and skin lesion changes  Physical  Examination: Blood pressure (!) 162/103, pulse 76, resp. rate 18, weight 100.9 kg (222 lb 7.1 oz), SpO2 99 %.  HEENT-  Normocephalic, no lesions, without obvious abnormality.  Normal external eye and conjunctiva.  Normal TM's bilaterally.  Normal auditory canals and external ears. Normal external nose, mucus membranes and septum.  Normal pharynx. Neck supple with no masses, nodes, nodules or enlargement. Cardiovascular - regular rate and rhythm, S1, S2 normal, no murmur, click, rub or gallop Lungs - chest clear, no wheezing, rales, normal symmetric air entry Abdomen - soft, non-tender; bowel sounds normal; no masses,  no organomegaly Extremities - no joint deformities, effusion, or inflammation  Neurologic Examination: Mental Status: Alert, oriented, thought content appropriate.  Speech fluent without evidence of aphasia. Able to follow commands without difficulty. Cranial Nerves: II-Visual fields were normal. III/IV/VI-Pupils were equal and normally to light. Extraocular movements were full and conjugate.    V/VII-reduced perception of tactile sensation over the right side of the face compared to the left; mild right lower facial droop. VIII-normal. X-normal speech.. Motor: 5/5 bilaterally with normal tone and bulk Sensory: Normal throughout. Deep Tendon Reflexes: 1+ and symmetric. Plantars: mute bilaterally Cerebellar: Normal finger-to-nose testing. Carotid auscultation: Normal  Results for orders placed or performed during the hospital encounter of 01/20/16 (from the past 48 hour(s))  Ethanol     Status: None   Collection Time: 01/20/16  8:51 PM  Result Value Ref Range   Alcohol, Ethyl (B) <5 <5 mg/dL    Comment:        LOWEST DETECTABLE LIMIT FOR SERUM ALCOHOL IS 5 mg/dL FOR MEDICAL PURPOSES ONLY   Protime-INR     Status: Abnormal   Collection Time: 01/20/16  8:51 PM  Result Value Ref Range   Prothrombin Time 21.5 (H) 11.4 - 15.2 seconds   INR 1.84   APTT     Status:  Abnormal   Collection Time: 01/20/16  8:51 PM  Result Value Ref Range   aPTT 37 (H) 24 - 36 seconds    Comment:        IF BASELINE aPTT IS ELEVATED, SUGGEST PATIENT RISK ASSESSMENT BE USED TO DETERMINE APPROPRIATE ANTICOAGULANT THERAPY.   CBC     Status: Abnormal   Collection Time: 01/20/16  8:51 PM  Result Value Ref Range   WBC 6.4 4.0 - 10.5 K/uL   RBC 4.37 4.22 - 5.81 MIL/uL   Hemoglobin 13.7 13.0 - 17.0 g/dL   HCT 40.7 39.0 - 52.0 %   MCV 93.1 78.0 - 100.0 fL   MCH 31.4 26.0 - 34.0 pg   MCHC 33.7 30.0 - 36.0 g/dL   RDW 16.5 (H) 11.5 - 15.5 %   Platelets 175 150 - 400 K/uL  Differential     Status: None   Collection Time: 01/20/16  8:51 PM  Result Value Ref Range   Neutrophils Relative % 64 %   Neutro Abs 4.1 1.7 - 7.7 K/uL   Lymphocytes Relative 21 %   Lymphs Abs 1.4 0.7 - 4.0 K/uL   Monocytes Relative 12 %   Monocytes Absolute 0.7 0.1 - 1.0 K/uL   Eosinophils Relative 2 %   Eosinophils Absolute 0.1 0.0 - 0.7  K/uL   Basophils Relative 1 %   Basophils Absolute 0.0 0.0 - 0.1 K/uL  Comprehensive metabolic panel     Status: Abnormal   Collection Time: 01/20/16  8:51 PM  Result Value Ref Range   Sodium 141 135 - 145 mmol/L   Potassium 3.9 3.5 - 5.1 mmol/L   Chloride 107 101 - 111 mmol/L   CO2 25 22 - 32 mmol/L   Glucose, Bld 96 65 - 99 mg/dL   BUN 15 6 - 20 mg/dL   Creatinine, Ser 0.92 0.61 - 1.24 mg/dL   Calcium 8.5 (L) 8.9 - 10.3 mg/dL   Total Protein 6.1 (L) 6.5 - 8.1 g/dL   Albumin 3.0 (L) 3.5 - 5.0 g/dL   AST 39 15 - 41 U/L   ALT 19 17 - 63 U/L   Alkaline Phosphatase 102 38 - 126 U/L   Total Bilirubin 0.2 (L) 0.3 - 1.2 mg/dL   GFR calc non Af Amer >60 >60 mL/min   GFR calc Af Amer >60 >60 mL/min    Comment: (NOTE) The eGFR has been calculated using the CKD EPI equation. This calculation has not been validated in all clinical situations. eGFR's persistently <60 mL/min signify possible Chronic Kidney Disease.    Anion gap 9 5 - 15  I-stat troponin, ED  (not at [facility], [facility])     Status: None   Collection Time: 01/20/16  8:54 PM  Result Value Ref Range   Troponin i, poc 0.01 0.00 - 0.08 ng/mL   Comment 3            Comment: Due to the release kinetics of cTnI, a negative result within the first hours of the onset of symptoms does not rule out myocardial infarction with certainty. If myocardial infarction is still suspected, repeat the test at appropriate intervals.   I-Stat Chem 8, ED  (not at [facility], [facility])     Status: Abnormal   Collection Time: 01/20/16  8:56 PM  Result Value Ref Range   Sodium 142 135 - 145 mmol/L   Potassium 3.9 3.5 - 5.1 mmol/L   Chloride 103 101 - 111 mmol/L   BUN 18 6 - 20 mg/dL   Creatinine, Ser 1.00 0.61 - 1.24 mg/dL   Glucose, Bld 97 65 - 99 mg/dL   Calcium, Ion 1.01 (L) 1.15 - 1.40 mmol/L   TCO2 25 0 - 100 mmol/L   Hemoglobin 14.3 13.0 - 17.0 g/dL   HCT 42.0 39.0 - 52.0 %   Ct Head Code Stroke W/o Cm  Result Date: 01/20/2016 CLINICAL DATA:  Code stroke. Code stroke for right-sided numbness and headache. EXAM: CT HEAD WITHOUT CONTRAST TECHNIQUE: Contiguous axial images were obtained from the base of the skull through the vertex without intravenous contrast. COMPARISON:  09/18/2015 FINDINGS: Brain: No evidence of acute infarction, hemorrhage, hydrocephalus, extra-axial collection or mass lesion/mass effect. Chronic microvascular disease with ischemic gliosis around the lateral ventricles, mild. Mild generalized atrophy. Vascular: Atherosclerotic calcification. No hyperdense vessel. Vertebrobasilar tortuosity. Skull: No acute or aggressive finding Sinuses/Orbits: Mucous retention cysts in the maxillary antra, greater on the right. Other: These results were called by telephone at the time of interpretation on 01/20/2016 at 9:12 pm to Dr. Nicole Kindred, who verbally acknowledged these results. ASPECTS [facility] Stroke Program Early CT Score) - Ganglionic level infarction (caudate, lentiform nuclei, internal capsule, insula, M1-M3  cortex): 7 - Supraganglionic infarction (M4-M6 cortex): 3 Total score (0-10 with 10 being normal): 10 IMPRESSION: 1. No acute finding or  change from prior. ASPECTS is 10. 2. Chronic microvascular disease. Electronically Signed   By: Monte Fantasia M.D.   On: 01/20/2016 21:15    Assessment: 69 y.o. male with multiple risk factors for stroke presenting with TIA or possibly an acute left subcortical small vessel ischemic infarction.  Stroke Risk Factors - family history and hypertension  Plan: 1. HgbA1c, fasting lipid panel 2. MRI, MRA  of the brain without contrast 3. PT consult, OT consult, Speech consult 4. Echocardiogram 5. Carotid dopplers 6. Prophylactic therapy-Anticoagulation: Coumadin 7. Risk factor modification 8. Telemetry monitoring  C.R. Nicole Kindred, MD [facility] [phone]  01/20/2016, 10:20 PM

## 2016-01-20 NOTE — H&P (Signed)
History and Physical    Don ConteCharles L Lemme BJY:[id number] DOB: 09/19/1945 DOA: 01/20/2016   PCP: Aura DialsBOUSKA,DAVID E, MD Chief Complaint:  Chief Complaint  Patient presents with  . Code Stroke    HPI: Don [name] is a 70 y.o. male with medical history significant of A.Fib, NICM and CHF with EF of 20% that improved to 40-45% post bi-ventricular pacer placement, HTN.  Patient presents to the ED with c/o acute onset R facial and RUE weakness and numbness.  Patient is on anticoagulation with coumadin, INR 1.84.  Deficits rapidly improving.  CT neg, NIH stroke scale of 1.  ED Course: See Dr. Marca AnconaStewart's note.  Review of Systems: As per HPI otherwise 10 point review of systems negative.    Past Medical History:  Diagnosis Date  . Alcohol abuse    PREVIOUS H/O DRINKING A FIFTH OF VODKA  A DAY  . Anginal pain (HCC)   . Arthritis    hands & back  . CHF (congestive heart failure) (HCC)   . Chronic kidney disease    acute renal  04/2012  . Chronic systolic heart failure (HCC)    LEFT  . Coronary artery disease   . History of angina   . Hypertension   . ICD (implantable cardiac defibrillator) in place   . LBBB (left bundle branch block)   . Nonischemic cardiomyopathy (HCC)    EF 20% s/p INSERTION OF BI-V DEFIBRILLATOR.  . Stroke (HCC)    TIA's  . Thyroid disease    HYPOTHYROIDISM.....TREATED  . TIA (transient ischemic attack)     Past Surgical History:  Procedure Laterality Date  . CARDIAC CATHETERIZATION    . CYSTOSCOPY W/ STONE MANIPULATION    . FLEXIBLE SIGMOIDOSCOPY N/A 04/29/2012   Procedure: FLEXIBLE SIGMOIDOSCOPY;  Surgeon: Vertell NovakJames L Edwards Jr., MD;  [facility];  Service: Endoscopy;  Laterality: N/A;  . IMPLANTABLE CARDIOVERTER DEFIBRILLATOR (ICD) GENERATOR CHANGE Bilateral 12/25/2011   Procedure: ICD GENERATOR CHANGE;  Surgeon: Marinus MawGregg W Taylor, MD;  [facility];  Service: Cardiovascular;  Laterality: Bilateral;  . INSERT / REPLACE / REMOVE  PACEMAKER     BI-V IMPLANTABLE CARDIOVERTER-DEFIBRILLATOR. ; [employer] CONCERTO; MODEL  # [id number], SERIAL # [id number] H.  DR. Humberto LeepJOHN H. EDMUNDS.  . TONSILLECTOMY    . VASECTOMY       reports that he quit smoking about 3 years ago. He has a 64.50 pack-year smoking history. He has never used smokeless tobacco. He reports that he does not drink alcohol or use drugs.  Allergies  Allergen Reactions  . Sulfa Antibiotics Nausea And Vomiting    Family History  Problem Relation Age of Onset  . Heart disease Mother   . Heart disease Father   . Hypertension        Prior to Admission medications   Medication Sig Start Date End Date Taking? Authorizing Provider  allopurinol (ZYLOPRIM) 100 MG tablet Take 100 mg by mouth daily.    Yes Historical Provider, MD  aspirin 81 MG tablet Take 1 tablet (81 mg total) by mouth daily. Patient taking differently: Take 81 mg by mouth every Monday, Wednesday, and Friday.  09/20/15  Yes Don Annett Gulaaniel Arrien, MD  atorvastatin (LIPITOR) 40 MG tablet Take 40 mg by mouth daily.  09/03/11  Yes Historical Provider, MD  benazepril (LOTENSIN) 10 MG tablet Take 1 tablet by mouth daily. 07/30/15  Yes Historical Provider, MD  carvedilol (COREG) 6.25 MG tablet Take 6.25 mg by mouth 2 (two) times daily with a  meal.   Yes Historical Provider, MD  furosemide (LASIX) 20 MG tablet Take 1 tablet by mouth daily. 10/25/15  Yes Historical Provider, MD  levothyroxine (SYNTHROID, LEVOTHROID) 50 MCG tablet Take 1 tablet (50 mcg total) by mouth daily before breakfast. 10/15/15  Yes Renae Fickle, MD  nitroGLYCERIN (NITROSTAT) 0.4 MG SL tablet Place 1 tablet (0.4 mg total) under the tongue every 5 (five) minutes as needed for chest pain. 11/19/15  Yes Marinus Maw, MD  OVER THE COUNTER MEDICATION Take 1 tablet by mouth daily. Multi-allergy tablet    Yes Historical Provider, MD  potassium chloride (K-DUR) 10 MEQ tablet Take 1 tablet by mouth every Monday, Wednesday, and Friday.  10/03/15  Yes  Historical Provider, MD  warfarin (COUMADIN) 3 MG tablet Take 3-4.5 mg by mouth See admin instructions. Take 1 and 1/2 tablets on Tuesday, Thursday and Saturday then take 1 tablet all there other days   Yes Historical Provider, MD    Physical Exam: Vitals:   01/20/16 2201 01/20/16 2215 01/20/16 2230 01/20/16 2245  BP: 150/97 154/91 160/93 (!) 167/101  Pulse: (!) 38 71 71 71  Resp: 12 22 13 21   SpO2: 98% 98% 98% 98%  Weight:          Constitutional: NAD, calm, comfortable Eyes: PERRL, lids and conjunctivae normal ENMT: Mucous membranes are moist. Posterior pharynx clear of any exudate or lesions.Normal dentition.  Neck: normal, supple, no masses, no thyromegaly Respiratory: clear to auscultation bilaterally, no wheezing, no crackles. Normal respiratory effort. No accessory muscle use.  Cardiovascular: Regular rate and rhythm, no murmurs / rubs / gallops. No extremity edema. 2+ pedal pulses. No carotid bruits.  Abdomen: no tenderness, no masses palpated. No hepatosplenomegaly. Bowel sounds positive.  Musculoskeletal: no clubbing / cyanosis. No joint deformity upper and lower extremities. Good ROM, no contractures. Normal muscle tone.  Skin: no rashes, lesions, ulcers. No induration Neurologic: Mild RUE weakness, mild facial droop. Psychiatric: Normal judgment and insight. Alert and oriented x 3. Normal mood.    Labs on Admission: I have personally reviewed following labs and imaging studies  CBC:  Recent Labs Lab 01/20/16 2051 01/20/16 2056  WBC 6.4  --   NEUTROABS 4.1  --   HGB 13.7 14.3  HCT 40.7 42.0  MCV 93.1  --   PLT 175  --    Basic Metabolic Panel:  Recent Labs Lab 01/20/16 2051 01/20/16 2056  NA 141 142  K 3.9 3.9  CL 107 103  CO2 25  --   GLUCOSE 96 97  BUN 15 18  CREATININE 0.92 1.00  CALCIUM 8.5*  --    GFR: Estimated Creatinine Clearance: 85.7 mL/min (by C-G formula based on SCr of 1 mg/dL). Liver Function Tests:  Recent Labs Lab  01/20/16 2051  AST 39  ALT 19  ALKPHOS 102  BILITOT 0.2*  PROT 6.1*  ALBUMIN 3.0*   No results for input(s): LIPASE, AMYLASE in the last 168 hours. No results for input(s): AMMONIA in the last 168 hours. Coagulation Profile:  Recent Labs Lab 01/20/16 2051  INR 1.84   Cardiac Enzymes: No results for input(s): CKTOTAL, CKMB, CKMBINDEX, TROPONINI in the last 168 hours. BNP (last 3 results) No results for input(s): PROBNP in the last 8760 hours. HbA1C: No results for input(s): HGBA1C in the last 72 hours. CBG: No results for input(s): GLUCAP in the last 168 hours. Lipid Profile: No results for input(s): CHOL, HDL, LDLCALC, TRIG, CHOLHDL, LDLDIRECT in the last 72 hours.  Thyroid Function Tests: No results for input(s): TSH, T4TOTAL, FREET4, T3FREE, THYROIDAB in the last 72 hours. Anemia Panel: No results for input(s): VITAMINB12, FOLATE, FERRITIN, TIBC, IRON, RETICCTPCT in the last 72 hours. Urine analysis:    Component Value Date/Time   COLORURINE YELLOW 10/12/2015 2143   APPEARANCEUR CLEAR 10/12/2015 2143   LABSPEC 1.017 10/12/2015 2143   PHURINE 5.5 10/12/2015 2143   GLUCOSEU NEGATIVE 10/12/2015 2143   HGBUR NEGATIVE 10/12/2015 2143   BILIRUBINUR NEGATIVE 10/12/2015 2143   KETONESUR NEGATIVE 10/12/2015 2143   PROTEINUR NEGATIVE 10/12/2015 2143   UROBILINOGEN 1.0 09/04/2014 1554   NITRITE NEGATIVE 10/12/2015 2143   LEUKOCYTESUR NEGATIVE 10/12/2015 2143   Sepsis Labs: @LABRCNTIP (procalcitonin:4,lacticidven:4) )No results found for this or any previous visit (from the past 240 hour(s)).   Radiological Exams on Admission: Ct Head Code Stroke W/o Cm  Result Date: 01/20/2016 CLINICAL DATA:  Code stroke. Code stroke for right-sided numbness and headache. EXAM: CT HEAD WITHOUT CONTRAST TECHNIQUE: Contiguous axial images were obtained from the base of the skull through the vertex without intravenous contrast. COMPARISON:  09/18/2015 FINDINGS: Brain: No evidence of acute  infarction, hemorrhage, hydrocephalus, extra-axial collection or mass lesion/mass effect. Chronic microvascular disease with ischemic gliosis around the lateral ventricles, mild. Mild generalized atrophy. Vascular: Atherosclerotic calcification. No hyperdense vessel. Vertebrobasilar tortuosity. Skull: No acute or aggressive finding Sinuses/Orbits: Mucous retention cysts in the maxillary antra, greater on the right. Other: These results were called by telephone at the time of interpretation on 01/20/2016 at 9:12 pm to Dr. Roseanne Reno, who verbally acknowledged these results. ASPECTS [facility] Stroke Program Early CT Score) - Ganglionic level infarction (caudate, lentiform nuclei, internal capsule, insula, M1-M3 cortex): 7 - Supraganglionic infarction (M4-M6 cortex): 3 Total score (0-10 with 10 being normal): 10 IMPRESSION: 1. No acute finding or change from prior. ASPECTS is 10. 2. Chronic microvascular disease. Electronically Signed   By: Marnee Spring M.D.   On: 01/20/2016 21:15    EKG: Independently reviewed.  Assessment/Plan Principal Problem:   Acute ischemic stroke [facility]) Active Problems:   Biventricular implantable cardioverter-defibrillator in situ   Essential hypertension   Atrial fibrillation (HCC) [I48.91]    1. Acute ischemic stroke - 1. Stroke pathway 2. Anticoagulation with coumadin 3. No MRI due to patient having a pacer / AICD (which he needs, and has already successfully shocked him out of V.Fib in the past). 4. PT/OT/SLP 5. 2d echo 2. HTN - 1. BP currently high for patient at 167 systolic 2. Will continue home meds, dont feel comfortable holding these to drive BP even higher given his extensive cardiac history 3. But will not add additional coverage unless he gets above 200 or becomes symptomatic 3. A.Fib - 1. Continue home meds 2. Continue coumadin   DVT prophylaxis: Coumadin Code Status: Full Family Communication: Family at bedside Consults called: Neurology Admission  status: Admit to inpatient   Hillary Bow DO [facility] Pager [phone] from 7PM-7AM  If 7AM-7PM, please contact the day physician for the patient [email or web address] Password [email or web address]  01/20/2016, 11:02 PM

## 2016-01-20 NOTE — Progress Notes (Signed)
ANTICOAGULATION CONSULT NOTE - Initial Consult  Pharmacy Consult for Coumadin Indication: atrial fibrillation  Allergies  Allergen Reactions  . Sulfa Antibiotics Nausea And Vomiting    Patient Measurements: Weight: 222 lb 7.1 oz (100.9 kg)  Vital Signs: BP: 167/101 (12/03 2245) Pulse Rate: 71 (12/03 2245)  Labs:  Recent Labs  01/20/16 2051 01/20/16 2056  HGB 13.7 14.3  HCT 40.7 42.0  PLT 175  --   APTT 37*  --   LABPROT 21.5*  --   INR 1.84  --   CREATININE 0.92 1.00    Estimated Creatinine Clearance: 85.7 mL/min (by C-G formula based on SCr of 1 mg/dL).   Medical History: Past Medical History:  Diagnosis Date  . Alcohol abuse    PREVIOUS H/O DRINKING A FIFTH OF VODKA  A DAY  . Anginal pain (HCC)   . Arthritis    hands & back  . CHF (congestive heart failure) (HCC)   . Chronic kidney disease    acute renal  04/2012  . Chronic systolic heart failure (HCC)    LEFT  . Coronary artery disease   . History of angina   . Hypertension   . ICD (implantable cardiac defibrillator) in place   . LBBB (left bundle branch block)   . Nonischemic cardiomyopathy (HCC)    EF 20% s/p INSERTION OF BI-V DEFIBRILLATOR.  . Stroke (HCC)    TIA's  . Thyroid disease    HYPOTHYROIDISM.....TREATED  . TIA (transient ischemic attack)     Medications:  See electronic med rec  Assessment: 70 y.o. M presents with TIA vs small CVA. Pt on coumadin PTA for afib. Admission INR 1.84 (subtherapeutic). CBC ok on admission. Home dose: 3mg  M/W/F and 4.5mg  T/T/S/S - last dose 03/4 0900  Goal of Therapy:  INR 2-3 Monitor platelets by anticoagulation protocol: Yes   Plan:  Coumadin 2mg  now Daily INR  Christoper Fabian, PharmD, BCPS Clinical pharmacist, pager [phone] 01/20/2016,11:01 PM

## 2016-01-20 NOTE — ED Provider Notes (Signed)
Emergency Department Provider Note   I have reviewed the triage vital signs and the nursing notes.   HISTORY  Chief Complaint Code Stroke   HPI Don [name] is a 70 y.o. male with PMH of prior CVA, HTN, CHF with ICD, and CKD presents to the emergency department for evaluation of sudden onset right face, arm, leg numbness. Patient was last known normal at 7:30 PM this evening. He reports nausea and right face numbness for his first symptoms. He debated about emergency department presentation and was brought to the hospital by his daughter. In route he began experiencing right arm numbness and later developed right leg numbness. He later appreciated some right facial droop. He did not notice any weakness in his right arm or leg. No slurred speech or difficulty swallowing. The patient does not appreciate any visual deficits. He notes a history of prior strokes. The patient is anticoagulated on Coumadin.   Past Medical History:  Diagnosis Date  . Alcohol abuse    PREVIOUS H/O DRINKING A FIFTH OF VODKA  A DAY  . Anginal pain (HCC)   . Arthritis    hands & back  . CHF (congestive heart failure) (HCC)   . Chronic kidney disease    acute renal  04/2012  . Chronic systolic heart failure (HCC)    LEFT  . Coronary artery disease   . History of angina   . Hypertension   . ICD (implantable cardiac defibrillator) in place   . LBBB (left bundle branch block)   . Nonischemic cardiomyopathy (HCC)    EF 20% s/p INSERTION OF BI-V DEFIBRILLATOR.  . Stroke (HCC)    TIA's  . Thyroid disease    HYPOTHYROIDISM.....TREATED  . TIA (transient ischemic attack)     Patient Active Problem List   Diagnosis Date Noted  . Encounter for therapeutic drug monitoring 11/19/2015  . Atrial fibrillation (HCC) [I48.91] 11/19/2015  . Thrombocytopenia (HCC) 10/15/2015  . Sepsis (HCC) 10/12/2015  . Pain in scapula 10/12/2015  . Collagenous colitis 10/12/2015  . Dizziness and giddiness   . History of  stroke 09/18/2015  . HLD (hyperlipidemia) 09/18/2015  . Hypothyroid 09/18/2015  . Essential hypertension 09/18/2015  . Tobacco use 09/18/2015  . 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 Hypocalcemia 02/13/2014  . Lymphadenopathy 09/28/2013  . Transient cerebral ischemia 08/16/2012  . Hemorrhoids with complication 04/29/2012  . Acute renal failure (HCC) 04/26/2012  . Hypovolemic shock (HCC) 04/26/2012  . Rectal bleeding 04/26/2012  . Diarrhea 04/26/2012  . Hypokalemia 04/26/2012  . Right rib fracture 04/26/2012  . Fall 04/26/2012  . Dehydration 04/26/2012  . ETOH abuse   . Acute ischemic stroke (HCC)   . 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   . Biventricular implantable cardioverter-defibrillator in situ 07/10/2010  . Chronic combined systolic (congestive) and diastolic (congestive) heart failure 07/10/2010  . Ventricular tachycardia (HCC) 07/10/2010    Past Surgical History:  Procedure Laterality Date  . CARDIAC CATHETERIZATION    . CYSTOSCOPY W/ STONE MANIPULATION    . FLEXIBLE SIGMOIDOSCOPY N/A 04/29/2012   Procedure: FLEXIBLE SIGMOIDOSCOPY;  Surgeon: Vertell NovakJames L Edwards Jr., MD;  [facility];  Service: Endoscopy;  Laterality: N/A;  . IMPLANTABLE CARDIOVERTER DEFIBRILLATOR (ICD) GENERATOR CHANGE Bilateral 12/25/2011   Procedure: ICD GENERATOR CHANGE;  Surgeon: Marinus MawGregg W Taylor, MD;  [facility];  Service: Cardiovascular;  Laterality: Bilateral;  . INSERT / REPLACE / REMOVE PACEMAKER     BI-V IMPLANTABLE CARDIOVERTER-DEFIBRILLATOR. ; [employer]; MODEL  #  [id number], SERIAL # [id number] H.  DR. Humberto Leep. EDMUNDS.  . TONSILLECTOMY    . VASECTOMY      Current Outpatient Rx  . Order #: [id number] Class: Historical Med  . Order #: [id number] Class: Normal  . Order #: [id number] Class: Historical Med  . Order #: [id number] Class: Historical Med  . Order #: [id number] Class: Historical Med  . Order #: [id number] Class: Historical Med  . Order #: [id number] Class:  Normal  . Order #: [id number] Class: Normal  . Order #: [id number] Class: Historical Med  . Order #: [id number] Class: Historical Med  . Order #: [id number] Class: Historical Med    Allergies Sulfa antibiotics  Family History  Problem Relation Age of Onset  . Heart disease Mother   . Heart disease Father   . Hypertension      Social History Social History  Substance Use Topics  . Smoking status: Former Smoker    Packs/day: 1.50    Years: 43.00    Quit date: 02/27/2012  . Smokeless tobacco: Never Used  . Alcohol use No     Comment: H/O ALCOHOLISM PERVIOUSLY A FIFTH OF VODKA A DAY    Review of Systems  Constitutional: No fever/chills Eyes: No visual changes. ENT: No sore throat. Cardiovascular: Denies chest pain. Respiratory: Denies shortness of breath. Gastrointestinal: No abdominal pain.  No nausea, no vomiting.  No diarrhea.  No constipation. Genitourinary: Negative for dysuria. Musculoskeletal: Negative for back pain. Skin: Negative for rash. Neurological: Negative for headaches. Positive right face droop and right face/arm/leg numbness.   10-point ROS otherwise negative.  ____________________________________________   PHYSICAL EXAM:  VITAL SIGNS:  Resp: 19 SpO2: 98% RA Pulse: 77 BP: 168/111  Constitutional: Alert and oriented. Well appearing and in no acute distress. Eyes: Conjunctivae are normal.  Head: Atraumatic. Nose: No congestion/rhinnorhea. Mouth/Throat: Mucous membranes are moist. Right face droop is noted.  Neck: No stridor. Cardiovascular: Normal rate, regular rhythm. Good peripheral circulation. Grossly normal heart sounds.   Respiratory: Normal respiratory effort.  No retractions. Lungs CTAB. Gastrointestinal: Soft and nontender. No distention.  Musculoskeletal: No lower extremity tenderness nor edema. No gross deformities of extremities. Neurologic:  Normal speech and language. Right face droop with normal strength and sensation otherwise. No  pronator drift. NIHSS 1.  Skin:  Skin is warm, dry and intact. No rash noted.  ____________________________________________   LABS (all labs ordered are listed, but only abnormal results are displayed)  Labs Reviewed  PROTIME-INR - Abnormal; Notable for the following:       Result Value   Prothrombin Time 21.5 (*)    All other components within normal limits  APTT - Abnormal; Notable for the following:    aPTT 37 (*)    All other components within normal limits  CBC - Abnormal; Notable for the following:    RDW 16.5 (*)    All other components within normal limits  COMPREHENSIVE METABOLIC PANEL - Abnormal; Notable for the following:    Calcium 8.5 (*)    Total Protein 6.1 (*)    Albumin 3.0 (*)    Total Bilirubin 0.2 (*)    All other components within normal limits  I-STAT CHEM 8, ED - Abnormal; Notable for the following:    Calcium, Ion 1.01 (*)    All other components within normal limits  ETHANOL  DIFFERENTIAL  RAPID URINE DRUG SCREEN, HOSP PERFORMED  URINALYSIS, ROUTINE W REFLEX MICROSCOPIC (NOT AT [facility])  HEMOGLOBIN A1C  LIPID PANEL  PROTIME-INR  I-STAT TROPOININ, ED   ____________________________________________  EKG  EKG Interpretation  Date/Time:  Sunday January 20 2016 23:01:01 EST Ventricular Rate:  70 PR Interval:    QRS Duration: 131 QT Interval:  444 QTC Calculation: 480 R Axis:   -89 Text Interpretation:  Atrial-sensed ventricular-paced complexes No further analysis attempted due to paced rhythm No STEMI.  Similar to prior.  Confirmed by LONG MD, JOSHUA [phone]) on 01/20/2016 11:06:10 PM       ____________________________________________  RADIOLOGY  Ct Head Code Stroke W/o Cm  Result Date: 01/20/2016 CLINICAL DATA:  Code stroke. Code stroke for right-sided numbness and headache. EXAM: CT HEAD WITHOUT CONTRAST TECHNIQUE: Contiguous axial images were obtained from the base of the skull through the vertex without intravenous contrast. COMPARISON:   09/18/2015 FINDINGS: Brain: No evidence of acute infarction, hemorrhage, hydrocephalus, extra-axial collection or mass lesion/mass effect. Chronic microvascular disease with ischemic gliosis around the lateral ventricles, mild. Mild generalized atrophy. Vascular: Atherosclerotic calcification. No hyperdense vessel. Vertebrobasilar tortuosity. Skull: No acute or aggressive finding Sinuses/Orbits: Mucous retention cysts in the maxillary antra, greater on the right. Other: These results were called by telephone at the time of interpretation on 01/20/2016 at 9:12 pm to Dr. Roseanne Reno, who verbally acknowledged these results. ASPECTS [facility] Stroke Program Early CT Score) - Ganglionic level infarction (caudate, lentiform nuclei, internal capsule, insula, M1-M3 cortex): 7 - Supraganglionic infarction (M4-M6 cortex): 3 Total score (0-10 with 10 being normal): 10 IMPRESSION: 1. No acute finding or change from prior. ASPECTS is 10. 2. Chronic microvascular disease. Electronically Signed   By: Marnee Spring M.D.   On: 01/20/2016 21:15    ____________________________________________   PROCEDURES  Procedure(s) performed:   Procedures  None ____________________________________________   INITIAL IMPRESSION / ASSESSMENT AND PLAN / ED COURSE  Pertinent labs & imaging results that were available during my care of the patient were reviewed by me and considered in my medical decision making (see chart for details).  Patient resents to the emergency department as a code stroke. He has right face droop and subjective numbness in the right face, arm, leg. Taken to CT scan after initial airway triage. Last known normal 7:30 PM. No acute distress.   09:11 PM Dr. Roseanne Reno has seen the patient and reviewed the CT scan. The patient is not a TPA candidate because of mild symptoms and he is on Coumadin. He recommends hospitalist admission and thromboembolic workup along with PT/OT/speech therapy PRN.   Discussed  patient's case with hospitalist, Dr. Julian Reil. He is in the ED seeing the patinet. Patient and family (if present) updated with plan. Care transferred to hospitalist service.  I reviewed all nursing notes, vitals, pertinent old records, EKGs, labs, imaging (as available).  ____________________________________________  FINAL CLINICAL IMPRESSION(S) / ED DIAGNOSES  Final diagnoses:  Acute ischemic stroke (HCC)     MEDICATIONS GIVEN DURING THIS VISIT:  Medications  atorvastatin (LIPITOR) tablet 40 mg (not administered)  benazepril (LOTENSIN) tablet 10 mg (not administered)  carvedilol (COREG) tablet 6.25 mg (not administered)  furosemide (LASIX) tablet 20 mg (not administered)  levothyroxine (SYNTHROID, LEVOTHROID) tablet 50 mcg (not administered)  potassium chloride (K-DUR) CR tablet 10 mEq (not administered)  aspirin tablet 81 mg (not administered)  allopurinol (ZYLOPRIM) tablet 100 mg (not administered)   stroke: mapping our early stages of recovery book (not administered)  warfarin (COUMADIN) tablet 2 mg (not administered)  Warfarin - Pharmacist Dosing Inpatient (not administered)     NEW OUTPATIENT MEDICATIONS STARTED DURING THIS VISIT:  None   Note:  This document was prepared using  [employer] and may include unintentional dictation errors.  Alona Bene, MD Emergency Medicine   Maia Plan, MD 01/20/16 [phone]

## 2016-01-21 ENCOUNTER — Inpatient Hospital Stay (HOSPITAL_COMMUNITY): Payer: PPO

## 2016-01-21 ENCOUNTER — Encounter (HOSPITAL_COMMUNITY): Payer: Self-pay | Admitting: *Deleted

## 2016-01-21 DIAGNOSIS — I4891 Unspecified atrial fibrillation: Secondary | ICD-10-CM | POA: Diagnosis not present

## 2016-01-21 DIAGNOSIS — I6789 Other cerebrovascular disease: Secondary | ICD-10-CM | POA: Diagnosis not present

## 2016-01-21 DIAGNOSIS — I10 Essential (primary) hypertension: Secondary | ICD-10-CM | POA: Diagnosis not present

## 2016-01-21 DIAGNOSIS — E785 Hyperlipidemia, unspecified: Secondary | ICD-10-CM

## 2016-01-21 DIAGNOSIS — I639 Cerebral infarction, unspecified: Secondary | ICD-10-CM

## 2016-01-21 LAB — VAS US CAROTID
LCCAPDIAS: 10 cm/s
LCCAPSYS: 54 cm/s
LEFT ECA DIAS: -9 cm/s
LEFT VERTEBRAL DIAS: 5 cm/s
LICAPSYS: -24 cm/s
Left CCA dist dias: -8 cm/s
Left CCA dist sys: -45 cm/s
Left ICA dist dias: -29 cm/s
Left ICA dist sys: -70 cm/s
Left ICA prox dias: -7 cm/s
RCCADSYS: -55 cm/s
RCCAPDIAS: 16 cm/s
RIGHT ECA DIAS: -9 cm/s
RIGHT VERTEBRAL DIAS: 6 cm/s
Right CCA prox sys: 47 cm/s

## 2016-01-21 LAB — URINALYSIS, ROUTINE W REFLEX MICROSCOPIC
Bilirubin Urine: NEGATIVE
GLUCOSE, UA: NEGATIVE mg/dL
HGB URINE DIPSTICK: NEGATIVE
Ketones, ur: NEGATIVE mg/dL
Leukocytes, UA: NEGATIVE
Nitrite: NEGATIVE
Protein, ur: NEGATIVE mg/dL
SPECIFIC GRAVITY, URINE: 1.019 (ref 1.005–1.030)
pH: 7.5 (ref 5.0–8.0)

## 2016-01-21 LAB — LIPID PANEL
Cholesterol: 147 mg/dL (ref 0–200)
HDL: 85 mg/dL (ref >40)
LDL Cholesterol: 53 mg/dL (ref 0–99)
Total CHOL/HDL Ratio: 1.7 RATIO
Triglycerides: 44 mg/dL (ref <150)
VLDL: 9 mg/dL (ref 0–40)

## 2016-01-21 LAB — RAPID URINE DRUG SCREEN, HOSP PERFORMED
Amphetamines: NOT DETECTED
BENZODIAZEPINES: NOT DETECTED
Barbiturates: NOT DETECTED
COCAINE: NOT DETECTED
Opiates: NOT DETECTED
Tetrahydrocannabinol: NOT DETECTED

## 2016-01-21 LAB — PROTIME-INR
INR: 1.72
Prothrombin Time: 20.4 seconds — ABNORMAL HIGH (ref 11.4–15.2)

## 2016-01-21 LAB — ECHOCARDIOGRAM COMPLETE
Height: 73 in
Weight: 3489.6 oz

## 2016-01-21 MED ORDER — WARFARIN SODIUM 4 MG PO TABS: 4.5000 mg | ORAL_TABLET | Freq: Once | ORAL | Status: DC

## 2016-01-21 MED ORDER — PERFLUTREN LIPID MICROSPHERE
1.0000 mL | INTRAVENOUS | Status: AC | PRN
  Administered 2016-01-21: 2 mL via INTRAVENOUS
  Filled 2016-01-21: qty 10

## 2016-01-21 MED ORDER — ONDANSETRON HCL 4 MG PO TABS: 4.0000 mg | ORAL_TABLET | Freq: Three times a day (TID) | ORAL | Status: DC | PRN

## 2016-01-21 MED ORDER — APIXABAN 5 MG PO TABS: 5.0000 mg | ORAL_TABLET | Freq: Two times a day (BID) | ORAL | 0 refills | Status: DC

## 2016-01-21 MED ORDER — APIXABAN 5 MG PO TABS
5.0000 mg | ORAL_TABLET | Freq: Two times a day (BID) | ORAL | Status: DC
  Administered 2016-01-21: 5 mg via ORAL
  Filled 2016-01-21: qty 1

## 2016-01-21 MED ORDER — ACETAMINOPHEN 325 MG PO TABS
650.0000 mg | ORAL_TABLET | Freq: Four times a day (QID) | ORAL | Status: DC | PRN
  Administered 2016-01-21 (×2): 650 mg via ORAL
  Filled 2016-01-21 (×2): qty 2

## 2016-01-21 NOTE — Discharge Summary (Signed)
PATIENT DETAILS Name: Don [name] Age: 70 y.o. Sex: male Date of Birth: 1945-04-05 MRN: [id number]. Admitting Physician: Hillary Bow, DO WUJ:[id number],NWGNF E, MD  Admit Date: 01/20/2016 Discharge date: 01/21/2016  Recommendations for Outpatient Follow-up:  1. Follow up with PCP in 1-2 weeks 2. Please obtain BMP/CBC in one week 3. Please ensure follow up with Neurology 4. New Med-Eliquis  Admitted From:  Home   Disposition: Home with home health services   Home Health: Yes  Equipment/Devices: None  Discharge Condition: Stable  CODE STATUS: FULL CODE  Diet recommendation:  Heart Healthy  Brief Summary: See H&P, Labs, Consult and Test reports for all details in brief, is a 70 y.o. male with medical history significant of A.Fib, NICM and CHF with EF of 20% that improved to 40-45% post bi-ventricular pacer placement, HTN admitted with transient right facial and upper extremity weakness.  Brief Hospital Course: Acute CVA: admitted with transient right upper ext weakness, still with a right facial droop.Unable to proceed with a MRI Brain due to AICD in place. Echo did not show any obvious emboli-on anticoagulation anyway. Carotid Doppler did not show any major stenosis. LDL at 53, A1C 4.9. Spoke with Stroke MD-Dr Brock Bad are to switch from coumadin to Eliquis and discharge home.   AOZ:HYQMVHQI coreg and benazepril-follow with PCP for further optimization  PAF: started on coumadin  Recently by cards-but with subtherapeutic INR-and now with possible CVA-recommendations are to switch to Eliquis. Continue rate control with coreg.   Dyslipidemia:continue Statin-LDL appears to be at goal.  Procedures/Studies: None  Discharge Diagnoses:  Principal Problem:   Acute ischemic stroke [facility]) Active Problems:   Biventricular implantable cardioverter-defibrillator in situ   Essential hypertension   Atrial fibrillation [facility]) [I48.91]  Discharge  Instructions:  Activity:  As tolerated with Full fall precautions use walker/cane & assistance as needed   Discharge Instructions    (HEART FAILURE PATIENTS) Call MD:  Anytime you have any of the following symptoms: 1) 3 pound weight gain in 24 hours or 5 pounds in 1 week 2) shortness of breath, with or without a dry hacking cough 3) swelling in the hands, feet or stomach 4) if you have to sleep on extra pillows at night in order to breathe.    Complete by:  As directed    Ambulatory referral to Neurology    Complete by:  As directed    Call MD for:  persistant dizziness or light-headedness    Complete by:  As directed    Diet - low sodium heart healthy    Complete by:  As directed    Discharge instructions    Complete by:  As directed    Follow with Primary MD  [name], MD  and Dr Roda Shutters (Neurology)  Please get a complete blood count and chemistry panel checked by your Primary MD at your next visit, and again as instructed by your Primary MD.  Get Medicines reviewed and adjusted: Please take all your medications with you for your next visit with your Primary MD  Laboratory/radiological data: Please request your Primary MD to go over all hospital tests and procedure/radiological results at the follow up, please ask your Primary MD to get all Hospital records sent to his/her office.  In some cases, they will be blood work, cultures and biopsy results pending at the time of your discharge. Please request that your primary care M.D. follows up on these results.  Also Note the following: If you experience worsening of  your admission symptoms, develop shortness of breath, life threatening emergency, suicidal or homicidal thoughts you must seek medical attention immediately by calling 911 or calling your MD immediately  if symptoms less severe.  You must read complete instructions/literature along with all the possible adverse reactions/side effects for all the Medicines you take and  that have been prescribed to you. Take any new Medicines after you have completely understood and accpet all the possible adverse reactions/side effects.   Do not drive when taking Pain medications or sleeping medications (Benzodaizepines)  Do not take more than prescribed Pain, Sleep and Anxiety Medications. It is not advisable to combine anxiety,sleep and pain medications without talking with your primary care practitioner  Special Instructions: If you have smoked or chewed Tobacco  in the last 2 yrs please stop smoking, stop any regular Alcohol  and or any Recreational drug use.  Wear Seat belts while driving.  Please note: You were cared for by a hospitalist during your hospital stay. Once you are discharged, your primary care physician will handle any further medical issues. Please note that NO REFILLS for any discharge medications will be authorized once you are discharged, as it is imperative that you return to your primary care physician (or establish a relationship with a primary care physician if you do not have one) for your post hospital discharge needs so that they can reassess your need for medications and monitor your lab values.   Increase activity slowly    Complete by:  As directed        Medication List    STOP taking these medications   warfarin 3 MG tablet Commonly known as:  COUMADIN     TAKE these medications   allopurinol 100 MG tablet Commonly known as:  ZYLOPRIM Take 100 mg by mouth daily.   apixaban 5 MG Tabs tablet Commonly known as:  ELIQUIS Take 1 tablet (5 mg total) by mouth 2 (two) times daily.   aspirin 81 MG tablet Take 1 tablet (81 mg total) by mouth daily. What changed:  when to take this   atorvastatin 40 MG tablet Commonly known as:  LIPITOR Take 40 mg by mouth daily.   benazepril 10 MG tablet Commonly known as:  LOTENSIN Take 1 tablet by mouth daily.   carvedilol 6.25 MG tablet Commonly known as:  COREG Take 6.25 mg by mouth 2  (two) times daily with a meal.   furosemide 20 MG tablet Commonly known as:  LASIX Take 1 tablet by mouth daily.   levothyroxine 50 MCG tablet Commonly known as:  SYNTHROID, LEVOTHROID Take 1 tablet (50 mcg total) by mouth daily before breakfast.   nitroGLYCERIN 0.4 MG SL tablet Commonly known as:  NITROSTAT Place 1 tablet (0.4 mg total) under the tongue every 5 (five) minutes as needed for chest pain.   OVER THE COUNTER MEDICATION Take 1 tablet by mouth daily. Multi-allergy tablet   potassium chloride 10 MEQ tablet Commonly known as:  K-DUR Take 1 tablet by mouth every Monday, Wednesday, and Friday.      Follow-up Information    BOUSKA,DAVID E, MD. Schedule an appointment as soon as possible for a visit in 2 week(s).   Specialty:  Family Medicine Contact information: [address] [facility] [id number] [phone]        Xu,Jindong, MD Follow up.   Specialty:  Neurology Why:  office will call with a follow up appointment, if you do not hear  from them, please give them a call Contact information: [address] [place] [address] [phone]          Allergies  Allergen Reactions  . Sulfa Antibiotics Nausea And Vomiting    Consultations:   neurology   Other Procedures/Studies: Dg Chest 2 View  Result Date: 12/28/2015 CLINICAL DATA:  Larey Seat 2 weeks ago with left-sided rib fractures. EXAM: CHEST  2 VIEW COMPARISON:  10/12/2015 FINDINGS: The heart is at the upper limits of normal in size. Pacemaker/ AICD remains in place. The aorta shows ectasia and tortuosity. The lungs are clear. No infiltrate, collapse or effusion. No pneumothorax. No rib fractures are demonstrated on these images. Rib films were not performed. IMPRESSION: No active disease. Atherosclerosis of the aorta. Pacemaker/ AICD. No pneumothorax or hemothorax. Rib fractures not demonstrated in the region included on this chest radiograph.  Electronically Signed   By: Paulina Fusi M.D.   On: 12/28/2015 18:45   Ct Head Code Stroke W/o Cm  Result Date: 01/20/2016 CLINICAL DATA:  Code stroke. Code stroke for right-sided numbness and headache. EXAM: CT HEAD WITHOUT CONTRAST TECHNIQUE: Contiguous axial images were obtained from the base of the skull through the vertex without intravenous contrast. COMPARISON:  09/18/2015 FINDINGS: Brain: No evidence of acute infarction, hemorrhage, hydrocephalus, extra-axial collection or mass lesion/mass effect. Chronic microvascular disease with ischemic gliosis around the lateral ventricles, mild. Mild generalized atrophy. Vascular: Atherosclerotic calcification. No hyperdense vessel. Vertebrobasilar tortuosity. Skull: No acute or aggressive finding Sinuses/Orbits: Mucous retention cysts in the maxillary antra, greater on the right. Other: These results were called by telephone at the time of interpretation on 01/20/2016 at 9:12 pm to Dr. Roseanne Reno, who verbally acknowledged these results. ASPECTS [facility] Stroke Program Early CT Score) - Ganglionic level infarction (caudate, lentiform nuclei, internal capsule, insula, M1-M3 cortex): 7 - Supraganglionic infarction (M4-M6 cortex): 3 Total score (0-10 with 10 being normal): 10 IMPRESSION: 1. No acute finding or change from prior. ASPECTS is 10. 2. Chronic microvascular disease. Electronically Signed   By: Marnee Spring M.D.   On: 01/20/2016 21:15      TODAY-DAY OF DISCHARGE:  Subjective:   Joyce Gross today has no headache,no chest abdominal pain,no new weakness tingling or numbness, feels much better wants to go home today.   Objective:   Blood pressure (!) 155/97, pulse 70, temperature 98 F (36.7 C), resp. rate 20, height 6\' 1"  (1.854 m), weight 98.9 kg (218 lb 1.6 oz), SpO2 97 %. No intake or output data in the 24 hours ending 01/21/16 1148 Filed Weights   01/20/16 2123 01/21/16 0036  Weight: 100.9 kg (222 lb 7.1 oz) 98.9 kg (218 lb 1.6 oz)     Exam: Awake Alert, Oriented *3, No new F.N deficits, Normal affect [place].AT,PERRAL Supple Neck,No JVD, No cervical lymphadenopathy appriciated.  Symmetrical Chest wall movement, Good air movement bilaterally, CTAB RRR,No Gallops,Rubs or new Murmurs, No Parasternal Heave +ve B.Sounds, Abd Soft, Non tender, No organomegaly appriciated, No rebound -guarding or rigidity. No Cyanosis, Clubbing or edema, No new Rash or bruise   PERTINENT RADIOLOGIC STUDIES: Dg Chest 2 View  Result Date: 12/28/2015 CLINICAL DATA:  Larey Seat 2 weeks ago with left-sided rib fractures. EXAM: CHEST  2 VIEW COMPARISON:  10/12/2015 FINDINGS: The heart is at the upper limits of normal in size. Pacemaker/ AICD remains in place. The aorta shows ectasia and tortuosity. The lungs are clear. No infiltrate, collapse or effusion. No pneumothorax. No rib fractures are demonstrated on these images. Rib  films were not performed. IMPRESSION: No active disease. Atherosclerosis of the aorta. Pacemaker/ AICD. No pneumothorax or hemothorax. Rib fractures not demonstrated in the region included on this chest radiograph. Electronically Signed   By: Paulina Fusi M.D.   On: 12/28/2015 18:45   Ct Head Code Stroke W/o Cm  Result Date: 01/20/2016 CLINICAL DATA:  Code stroke. Code stroke for right-sided numbness and headache. EXAM: CT HEAD WITHOUT CONTRAST TECHNIQUE: Contiguous axial images were obtained from the base of the skull through the vertex without intravenous contrast. COMPARISON:  09/18/2015 FINDINGS: Brain: No evidence of acute infarction, hemorrhage, hydrocephalus, extra-axial collection or mass lesion/mass effect. Chronic microvascular disease with ischemic gliosis around the lateral ventricles, mild. Mild generalized atrophy. Vascular: Atherosclerotic calcification. No hyperdense vessel. Vertebrobasilar tortuosity. Skull: No acute or aggressive finding Sinuses/Orbits: Mucous retention cysts in the maxillary antra, greater on the right.  Other: These results were called by telephone at the time of interpretation on 01/20/2016 at 9:12 pm to Dr. Roseanne Reno, who verbally acknowledged these results. ASPECTS [facility] Stroke Program Early CT Score) - Ganglionic level infarction (caudate, lentiform nuclei, internal capsule, insula, M1-M3 cortex): 7 - Supraganglionic infarction (M4-M6 cortex): 3 Total score (0-10 with 10 being normal): 10 IMPRESSION: 1. No acute finding or change from prior. ASPECTS is 10. 2. Chronic microvascular disease. Electronically Signed   By: Marnee Spring M.D.   On: 01/20/2016 21:15     PERTINENT LAB RESULTS: CBC:  Recent Labs  01/20/16 2051 01/20/16 2056  WBC 6.4  --   HGB 13.7 14.3  HCT 40.7 42.0  PLT 175  --    CMET CMP     Component Value Date/Time   NA 142 01/20/2016 2056   NA 138 02/14/2014 0916   K 3.9 01/20/2016 2056   K 3.7 02/14/2014 0916   CL 103 01/20/2016 2056   CO2 25 01/20/2016 2051   CO2 31 (H) 02/14/2014 0916   GLUCOSE 97 01/20/2016 2056   GLUCOSE 77 02/14/2014 0916   BUN 18 01/20/2016 2056   BUN 8.2 02/14/2014 0916   CREATININE 1.00 01/20/2016 2056   CREATININE 0.8 02/14/2014 0916   CALCIUM 8.5 (L) 01/20/2016 2051   CALCIUM 8.6 02/14/2014 0916   PROT 6.1 (L) 01/20/2016 2051   PROT 6.3 (L) 09/28/2013 1340   ALBUMIN 3.0 (L) 01/20/2016 2051   ALBUMIN 3.0 (L) 09/28/2013 1340   AST 39 01/20/2016 2051   AST 24 09/28/2013 1340   ALT 19 01/20/2016 2051   ALT 12 09/28/2013 1340   ALKPHOS 102 01/20/2016 2051   ALKPHOS 72 09/28/2013 1340   BILITOT 0.2 (L) 01/20/2016 2051   BILITOT 0.49 09/28/2013 1340   GFRNONAA >60 01/20/2016 2051   GFRNONAA 85 02/13/2014 1401   GFRAA >60 01/20/2016 2051   GFRAA >89 02/13/2014 1401    GFR Estimated Creatinine Clearance: 86.3 mL/min (by C-G formula based on SCr of 1 mg/dL). No results for input(s): LIPASE, AMYLASE in the last 72 hours. No results for input(s): CKTOTAL, CKMB, CKMBINDEX, TROPONINI in the last 72 hours. Invalid input(s):  POCBNP No results for input(s): DDIMER in the last 72 hours. No results for input(s): HGBA1C in the last 72 hours.  Recent Labs  01/21/16 0522  CHOL 147  HDL 85  LDLCALC 53  TRIG 44  CHOLHDL 1.7   No results for input(s): TSH, T4TOTAL, T3FREE, THYROIDAB in the last 72 hours.  Invalid input(s): FREET3 No results for input(s): VITAMINB12, FOLATE, FERRITIN, TIBC, IRON, RETICCTPCT in the last 72 hours.  Coags:  Recent Labs  01/20/16 2051 01/21/16 0522  INR 1.84 1.72   Microbiology: No results found for this or any previous visit (from the past 240 hour(s)).  FURTHER DISCHARGE INSTRUCTIONS:  Get Medicines reviewed and adjusted: Please take all your medications with you for your next visit with your Primary MD  Laboratory/radiological data: Please request your Primary MD to go over all hospital tests and procedure/radiological results at the follow up, please ask your Primary MD to get all Hospital records sent to his/her office.  In some cases, they will be blood work, cultures and biopsy results pending at the time of your discharge. Please request that your primary care M.D. goes through all the records of your hospital data and follows up on these results.  Also Note the following: If you experience worsening of your admission symptoms, develop shortness of breath, life threatening emergency, suicidal or homicidal thoughts you must seek medical attention immediately by calling 911 or calling your MD immediately  if symptoms less severe.  You must read complete instructions/literature along with all the possible adverse reactions/side effects for all the Medicines you take and that have been prescribed to you. Take any new Medicines after you have completely understood and accpet all the possible adverse reactions/side effects.   Do not drive when taking Pain medications or sleeping medications (Benzodaizepines)  Do not take more than prescribed Pain, Sleep and Anxiety  Medications. It is not advisable to combine anxiety,sleep and pain medications without talking with your primary care practitioner  Special Instructions: If you have smoked or chewed Tobacco  in the last 2 yrs please stop smoking, stop any regular Alcohol  and or any Recreational drug use.  Wear Seat belts while driving.  Please note: You were cared for by a hospitalist during your hospital stay. Once you are discharged, your primary care physician will handle any further medical issues. Please note that NO REFILLS for any discharge medications will be authorized once you are discharged, as it is imperative that you return to your primary care physician (or establish a relationship with a primary care physician if you do not have one) for your post hospital discharge needs so that they can reassess your need for medications and monitor your lab values.  Total Time spent coordinating discharge including counseling, education and face to face time equals  45 minutes.  SignedJeoffrey Massed 01/21/2016 11:48 AM

## 2016-01-21 NOTE — Progress Notes (Signed)
Patient with c/o h/a to right temple. Paged on call, received order for tylenol 650mg  q6h prn and zofran 4mg  q8h prn. Continue to monitor patient.

## 2016-01-21 NOTE — Progress Notes (Signed)
Patient arrived to unit via ED staff, vitals taken, tele applied and verified. Oriented to unit/room. Questions answered at this time. Admission completed. Continue to monitor.

## 2016-01-21 NOTE — Care Management Note (Signed)
Case Management Note  Patient Details  Name: Don [name] MRN: [id number] Date of Birth: 21-Sep-1945  Subjective/Objective:                    Action/Plan: Pt discharging home with his daughter. Pt with orders for [facility] services. CM met with the patient and provided him the list of [facility] agencies in [place]. He chose [facility]. Santiago Glad with [facility] notified and accepted the referral. Pt also discharging on Eliquis. Benefits check done that came back with a cost of $45/ month. Pt also given 30 day free card. Pt feels he will try and manage the cost. CM encouraged him to call the Eliquis number and let his PCP know if he decides he can not afford the medication in the first 30 days.  Expected Discharge Date:                  Expected Discharge Plan:  [place]  In-House Referral:     Discharge planning Services  CM Consult  Post Acute Care Choice:  Home Health Choice offered to:  Patient  DME Arranged:    DME Agency:     HH Arranged:  [place]  Status of Service:  Completed, signed off  If discussed at [place] of Stay Meetings, dates discussed:    Additional Comments:  Pollie Friar, RN 01/21/2016, 12:48 PM

## 2016-01-21 NOTE — Progress Notes (Signed)
Patient ready for discharge to home; discharge instructions given and reviewed; Rx given; patient dressed and ready for release; patient discharged out via wheelchair accompanied by his daughter.

## 2016-01-21 NOTE — Evaluation (Signed)
Physical Therapy Evaluation and Discharge Patient Details Name: Don [name] MRN: [id number] DOB: [birth year] Today's Date: 01/21/2016   History of Present Illness  Pt is a 70 y/o male who presents with facial numbness and R side numbness/tingling.   Clinical Impression  Patient evaluated by Physical Therapy with no further acute PT needs identified. All education has been completed and the patient has no further questions. At the time of PT eval pt was able to perform transfers and ambulation with modified independence. Pt reports that he feels back to baseline "for the most part" but continues to have pain from previous fall and reported broken ribs/ bruised back. See below for any follow-up Physical Therapy or equipment needs. PT is signing off. Thank you for this referral.     Follow Up Recommendations Outpatient PT    Equipment Recommendations  Other (comment) (Shower chair - pt wants to get himself after d/c)    Recommendations for Other Services       Precautions / Restrictions Precautions Precautions: Fall Restrictions Weight Bearing Restrictions: No      Mobility  Bed Mobility Overal bed mobility: Needs Assistance Bed Mobility: Supine to Sit     Supine to sit: Modified independent (Device/Increase time)        Transfers Overall transfer level: Modified independent Equipment used: None                Ambulation/Gait Ambulation/Gait assistance: Modified independent (Device/Increase time) Ambulation Distance (Feet): 400 Feet Assistive device: None Gait Pattern/deviations: Step-through pattern;Decreased stride length   Gait velocity interpretation: Below normal speed for age/gender General Gait Details: No unsteadiness noted, no LOB observed  Stairs Stairs: Yes Stairs assistance: Supervision Stair Management: One rail Right;Alternating pattern;Forwards Number of Stairs: 12 General stair comments: Pt able to complete without cues  Wheelchair  Mobility    Modified Rankin (Stroke Patients Only)       Balance Overall balance assessment: No apparent balance deficits (not formally assessed)                                           Pertinent Vitals/Pain Pain Assessment: No/denies pain    Home Living Family/patient expects to be discharged to:: Private residence Living Arrangements: Children Available Help at Discharge: Family;Available 24 hours/day Type of Home: House Home Access: Stairs to enter Entrance Stairs-Rails: Can reach both;Left;Right Entrance Stairs-Number of Steps: 8 Home Layout: Multi-level;Bed/bath upstairs;1/2 bath on main level Home Equipment: Walker - 2 wheels;Cane - single point      Prior Function Level of Independence: Independent               Hand Dominance   Dominant Hand: Right    Extremity/Trunk Assessment   Upper Extremity Assessment: Overall WFL for tasks assessed           Lower Extremity Assessment: Overall WFL for tasks assessed      Cervical / Trunk Assessment: Other exceptions  Communication   Communication: No difficulties  Cognition Arousal/Alertness: Awake/alert Behavior During Therapy: WFL for tasks assessed/performed Overall Cognitive Status: Within Functional Limits for tasks assessed                      General Comments      Exercises     Assessment/Plan    PT Assessment Patent does not need any further PT services  PT Problem List  PT Treatment Interventions      PT Goals (Current goals can be found in the Care Plan section)  Acute Rehab PT Goals Patient Stated Goal: Home today PT Goal Formulation: All assessment and education complete, DC therapy    Frequency     Barriers to discharge        Co-evaluation               End of Session Equipment Utilized During Treatment: Gait belt Activity Tolerance: Patient tolerated treatment well Patient left: in chair;with call bell/phone within  reach;with family/visitor present Nurse Communication: Mobility status         Time: [id number] PT Time Calculation (min) (ACUTE ONLY): 13 min   Charges:   PT Evaluation $PT Eval Low Complexity: 1 Procedure     PT G CodesMarylynn Pearson:        Hiedi Touchton D Tyese Finken 01/21/2016, 2:33 PM  Conni SlipperLaura Kaito Schulenburg, PT, DPT Acute Rehabilitation Services Pager: [phone]

## 2016-01-21 NOTE — Progress Notes (Signed)
Code Stroke  LSN 1945.  Symptoms per patient RT sided facial numbness and RT arm numbness, per daughter, RT facial droop. NIH 1.  Patient has been nauseated for the past few hours and has a mild HA. Recently fell and fractures 3 ribs on the left side. Patient is on coumadin.  CT NEGATIVE.  Admitted to 5C20.

## 2016-01-21 NOTE — Progress Notes (Signed)
Echocardiogram 2D Echocardiogram has been performed with definity.  Don [name] 01/21/2016, 10:00 AM

## 2016-01-21 NOTE — Evaluation (Signed)
Speech Language Pathology Evaluation Patient Details Name: Don [name] MRN: [id number] DOB: 12/08/1945 Today's Date: 01/21/2016 Time: [id number] SLP Time Calculation (min) (ACUTE ONLY): 18 min  Problem List:  Patient Active Problem List   Diagnosis Date Noted  . Encounter for therapeutic drug monitoring 11/19/2015  . Atrial fibrillation (HCC) [I48.91] 11/19/2015  . Thrombocytopenia (HCC) 10/15/2015  . Sepsis (HCC) 10/12/2015  . Pain in scapula 10/12/2015  . Collagenous colitis 10/12/2015  . Dizziness and giddiness   . History of stroke 09/18/2015  . HLD (hyperlipidemia) 09/18/2015  . Hypothyroid 09/18/2015  . Essential hypertension 09/18/2015  . Tobacco use 09/18/2015  . 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 Hypocalcemia 02/13/2014  . Lymphadenopathy 09/28/2013  . Transient cerebral ischemia 08/16/2012  . Hemorrhoids with complication 04/29/2012  . Acute renal failure (HCC) 04/26/2012  . Hypovolemic shock (HCC) 04/26/2012  . Rectal bleeding 04/26/2012  . Diarrhea 04/26/2012  . Hypokalemia 04/26/2012  . Right rib fracture 04/26/2012  . Fall 04/26/2012  . Dehydration 04/26/2012  . ETOH abuse   . Acute ischemic stroke (HCC)   . 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   . Biventricular implantable cardioverter-defibrillator in situ 07/10/2010  . Chronic combined systolic (congestive) and diastolic (congestive) heart failure 07/10/2010  . Ventricular tachycardia (HCC) 07/10/2010   Past Medical History:  Past Medical History:  Diagnosis Date  . Alcohol abuse    PREVIOUS H/O DRINKING A FIFTH OF VODKA  A DAY  . Anginal pain (HCC)   . Arthritis    hands & back  . CHF (congestive heart failure) (HCC)   . Chronic kidney disease    acute renal  04/2012  . Chronic systolic heart failure (HCC)    LEFT  . Coronary artery disease   . History of angina   . Hypertension   . ICD (implantable cardiac defibrillator) in place   . LBBB (left bundle branch  block)   . Nonischemic cardiomyopathy (HCC)    EF 20% s/p INSERTION OF BI-V DEFIBRILLATOR.  . Stroke (HCC)    TIA's  . Thyroid disease    HYPOTHYROIDISM.....TREATED  . TIA (transient ischemic attack)    Past Surgical History:  Past Surgical History:  Procedure Laterality Date  . CARDIAC CATHETERIZATION    . CYSTOSCOPY W/ STONE MANIPULATION    . FLEXIBLE SIGMOIDOSCOPY N/A 04/29/2012   Procedure: FLEXIBLE SIGMOIDOSCOPY;  Surgeon: Vertell NovakJames L Edwards Jr., MD;  [facility];  Service: Endoscopy;  Laterality: N/A;  . IMPLANTABLE CARDIOVERTER DEFIBRILLATOR (ICD) GENERATOR CHANGE Bilateral 12/25/2011   Procedure: ICD GENERATOR CHANGE;  Surgeon: Marinus MawGregg W Taylor, MD;  [facility];  Service: Cardiovascular;  Laterality: Bilateral;  . INSERT / REPLACE / REMOVE PACEMAKER     BI-V IMPLANTABLE CARDIOVERTER-DEFIBRILLATOR. ; [employer]; MODEL  # [id number], SERIAL # [id number] H.  DR. Humberto LeepJOHN H. EDMUNDS.  . TONSILLECTOMY    . VASECTOMY     HPI:  Don PeekCharles L Stephensonis a 70 y.o.malewith medical history significant of A.Fib, NICM and CHF with EF of 20% that improved to 40-45% post bi-ventricular pacer placement, HTN. Patient presents to the ED with c/o acute onset R facial and RUE weakness and numbness. Patient is on anticoagulation with coumadin, INR 1.84. Deficits rapidly improving. CT neg, NIH stroke scale of 1. MRI on 01/20/16 showed no evidence of acute infarction, hemorrhage, hydrocephalus, extra-axial collection or mass lesion/mass effect. No previous history of ST needs.   Assessment / Plan / Recommendation Clinical Impression  Pt presents with right side  facial weakness which doesn't appear to impact his speech intelligibilty. Pt able to communicate with ~100% at the simple conversation level in a mildy noisy environment. Nursing and pt do not report any cognitive-linguistic deficits or oral dysphagia related to facial weakness. Education provided monitioring his facial.      SLP Assessment  Patient does not need any further Speech Lanaguage Pathology Services    Follow Up Recommendations  None               SLP Evaluation Cognition  Overall Cognitive Status: Within Functional Limits for tasks assessed Arousal/Alertness: Awake/alert Orientation Level: Oriented X4 Attention: Selective Selective Attention: Appears intact Memory: Appears intact Awareness: Appears intact Problem Solving: Appears intact Safety/Judgment: Appears intact       Comprehension  Auditory Comprehension Overall Auditory Comprehension: Appears within functional limits for tasks assessed Yes/No Questions: Within Functional Limits Commands: Within Functional Limits Conversation: Simple Visual Recognition/Discrimination Discrimination: Not tested Reading Comprehension Reading Status: Not tested    Expression Expression Primary Mode of Expression: Verbal Verbal Expression Overall Verbal Expression: Appears within functional limits for tasks assessed Initiation: No impairment Level of Generative/Spontaneous Verbalization: Conversation Repetition: No impairment Naming: No impairment Pragmatics: No impairment Non-Verbal Means of Communication: Not applicable Written Expression Dominant Hand: Right Written Expression: Not tested   Oral / Motor  Oral Motor/Sensory Function Overall Oral Motor/Sensory Function: Mild impairment Facial ROM: Reduced right Facial Symmetry: Abnormal symmetry right Facial Strength: Reduced right Facial Sensation: Reduced right Lingual ROM: Within Functional Limits Lingual Symmetry: Within Functional Limits Lingual Strength: Within Functional Limits Lingual Sensation: Within Functional Limits Velum: Within Functional Limits Mandible: Within Functional Limits Motor Speech Overall Motor Speech: Appears within functional limits for tasks assessed Respiration: Within functional limits Phonation: Normal Resonance: Within functional  limits Articulation: Within functional limitis Intelligibility: Intelligible Motor Planning: Witnin functional limits Motor Speech Errors: Not applicable   GO                   Samyrah Bruster B. Dreama Saa, M.S., CCC-SLP Speech-Language Pathologist [phone] Daphna Lafuente 01/21/2016, 9:06 AM

## 2016-01-21 NOTE — Progress Notes (Addendum)
ANTICOAGULATION CONSULT NOTE - Initial Consult  Pharmacy Consult for Coumadin Indication: atrial fibrillation  Allergies  Allergen Reactions  . Sulfa Antibiotics Nausea And Vomiting    Patient Measurements: Height: 6\' 1"  (185.4 cm) Weight: 218 lb 1.6 oz (98.9 kg) IBW/kg (Calculated) : 79.9  Vital Signs: Temp: 98 F (36.7 C) (12/04 0800) Temp Source: Oral (12/04 0635) BP: 155/97 (12/04 0800) Pulse Rate: 70 (12/04 0800)  Labs:  Recent Labs  01/20/16 2051 01/20/16 2056 01/21/16 0522  HGB 13.7 14.3  --   HCT 40.7 42.0  --   PLT 175  --   --   APTT 37*  --   --   LABPROT 21.5*  --  20.4*  INR 1.84  --  1.72  CREATININE 0.92 1.00  --     Assessment: 70 y.o. M presents with TIA vs small CVA. On Coumadin 4.5mg  daily exc for 3mg  on Mon/Wed/Fri PTA for Afib. Admission INR 1.84 (subtherapeutic). Last dose was taken at home on 12/3 and then given an extra small dose in the evening when admitted. INR decreased slightly to 1.72 today. CBC stable, no s/s of bleed.  Goal of Therapy:  INR 2-3 Monitor platelets by anticoagulation protocol: Yes   Plan:  Give Coumadin 4.5mg  PO x 1 Monitor daily INR, CBC, s/s of bleed  ADDENDUM:  MD would like to switch Coumadin to Eliquis now. Since INR is < 2 will transition to Eliquis today. Age, weight, and renal function all ok for 5mg  BID dosing.  Plan: Stop Coumadin Start Eliquis 5mg  PO BID Monitor CBC, s/s of bleed  Enzo Bi, PharmD, [facility] Clinical Pharmacist Pager [phone] 01/21/2016 8:35 AM

## 2016-01-21 NOTE — Progress Notes (Signed)
STROKE TEAM PROGRESS NOTE   HISTORY OF PRESENT ILLNESS (per record) Don [name] is an 70 y.o. male with a history of atrial fibrillation, nonischemic cardiomyopathy, TIA and CHF, brought to the ED following acute onset ofright facial and upper extremity weakness and numbness. Patient has been on anticoagulation with Coumadin. INR was 1.84. Deficits improved rapidly. Patient still had mild facial numbness and slight right facial droop at the time of this evaluation. CT scan of his head showed no acute intracranial abnormality. NIH stroke score was 1. He was LKW at 7:45 PM on 01/20/2016 Patient was not administered IV t-PA secondary to deficits rapidly resolving. He was admitted for further evaluation and treatment.   SUBJECTIVE (INTERVAL HISTORY) No family at bedside. He stated that Dr. Sharlot [name] put him on coumadin because he asking for cheaper medications. However, at this time, he is Ok with DOACs. He is taking ASA 3 times per week.    OBJECTIVE Temp:  [97.7 F (36.5 C)-98.5 F (36.9 C)] 98 F (36.7 C) (12/04 0800) Pulse Rate:  [38-77] 70 (12/04 0800) Cardiac Rhythm: Other (Comment) (12/04 0101) Resp:  [12-23] 20 (12/04 0800) BP: (119-176)/(73-111) 155/97 (12/04 0800) SpO2:  [96 %-100 %] 97 % (12/04 0800) FiO2 (%):  [21 %] 21 % (12/03 2300) Weight:  [98.9 kg (218 lb 1.6 oz)-100.9 kg (222 lb 7.1 oz)] 98.9 kg (218 lb 1.6 oz) (12/04 0036)  CBC:  Recent Labs Lab 01/20/16 2051 01/20/16 2056  WBC 6.4  --   NEUTROABS 4.1  --   HGB 13.7 14.3  HCT 40.7 42.0  MCV 93.1  --   PLT 175  --     Basic Metabolic Panel:  Recent Labs Lab 01/20/16 2051 01/20/16 2056  NA 141 142  K 3.9 3.9  CL 107 103  CO2 25  --   GLUCOSE 96 97  BUN 15 18  CREATININE 0.92 1.00  CALCIUM 8.5*  --     Lipid Panel:    Component Value Date/Time   CHOL 147 01/21/2016 0522   TRIG 44 01/21/2016 0522   HDL 85 01/21/2016 0522   CHOLHDL 1.7 01/21/2016 0522   VLDL 9 01/21/2016 0522   LDLCALC 53  01/21/2016 0522   HgbA1c:  Lab Results  Component Value Date   HGBA1C 4.9 09/19/2015   Urine Drug Screen:    Component Value Date/Time   LABOPIA NONE DETECTED 01/20/2016 2352   COCAINSCRNUR NONE DETECTED 01/20/2016 2352   LABBENZ NONE DETECTED 01/20/2016 2352   AMPHETMU NONE DETECTED 01/20/2016 2352   THCU NONE DETECTED 01/20/2016 2352   LABBARB NONE DETECTED 01/20/2016 2352      IMAGING I have personally reviewed the radiological images below and agree with the radiology interpretations.  Ct Head Code Stroke W/o Cm 01/20/2016 1. No acute finding or change from prior. ASPECTS is 10. 2. Chronic microvascular disease.   2D Echocardiogram  - Left ventricle: The cavity size was moderately dilated. Systolic function was normal. The estimated ejection fraction was in the range of 50% to 55%. Wall motion was normal; there were no regional wall motion abnormalities. Doppler parameters are consistent with abnormal left ventricular relaxation (grade 1 diastolic dysfunction).  Carotid Doppler   There is 1-39% bilateral ICA stenosis. Vertebral artery flow is antegrade.    PHYSICAL EXAM  Temp:  [98 F (36.7 C)-98.5 F (36.9 C)] 98.4 F (36.9 C) (12/04 1200) Pulse Rate:  [70-74] 70 (12/04 1200) Resp:  [16-20] 20 (12/04 1200) BP: (119-155)/(73-97) 148/87 (12/04  1200) SpO2:  [96 %-98 %] 98 % (12/04 1200)  General - Well nourished, well developed, in no apparent distress.  Ophthalmologic - Sharp disc margins OU.   Cardiovascular - Regular rate and rhythm.  Mental Status -  Level of arousal and orientation to time, place, and person were intact. Language including expression, naming, repetition, comprehension was assessed and found intact. Fund of Knowledge was assessed and was intact.  Cranial Nerves II - XII - II - Visual field intact OU. III, IV, VI - Extraocular movements intact. V - Facial sensation intact bilaterally. VII - mild right facial droop. VIII - Hearing &  vestibular intact bilaterally. X - Palate elevates symmetrically. XI - Chin turning & shoulder shrug intact bilaterally. XII - Tongue protrusion intact.  Motor Strength - The patient's strength was normal in all extremities and pronator drift was absent.  Bulk was normal and fasciculations were absent.   Motor Tone - Muscle tone was assessed at the neck and appendages and was normal.  Reflexes - The patient's reflexes were 1+ in all extremities and he had no pathological reflexes.  Sensory - Light touch, temperature/pinprick were assessed and were symmetrical.    Coordination - The patient had normal movements in the hands and feet with no ataxia or dysmetria.  Tremor was absent.  Gait and Station - deferred   ASSESSMENT/PLAN [name] is a 70 y.o. male with history of atrial fibrillation, nonischemic cardiomyopathy, TIA and CHF presenting with acute L facial and LEU weakness and numbness. He did not receive IV t-PA due to deficits rapidly resolving.   Stroke: left brain stroke likely embolic due to paroxsymal afib with subtherapeutic INR  Resultant  R facial droop  MRI  / MRA  ICD  Carotid Doppler  No significant stenosis   2D Echo EF 50-55% improved from 40-45% in 09/2015. No SOE   LDL 53  HgbA1c 4.8  Warfarin for VTE prophylaxis  Diet Heart Room service appropriate? Yes; Fluid consistency: Thin  aspirin 81 mg daily and warfarin daily prior to admission, now on aspirin 81 mg daily and warfarin daily. Recommend to switch from coumadin to DOACs such as eliquis.   Patient counseled to be compliant with his antithrombotic medications  Ongoing aggressive stroke risk factor management  Therapy recommendations:  HH  Disposition:  Home with HH  Hx Stroke  09/2015 L brain TIA vs likely infarct, presenting with R facial droop and R side numbness. Placed on DAPT  05/2013 R arm and face numbness that resolved.  aspirin and plavix continued  07/2012 TIA vs. L  brain subcortical infarct not seen on CT (has ICD) w/ right arm and facial numbness - continued on DAPT  Atrial Fibrillation, new diagnosed in 10/2015 (since previous stroke)  Home anticoagulation:  warfarin daily continued in the hospital, pharmacy managing  INR 1.84 on admission, subtherapeutic  INR 1.72 today  Recommend change to eliquis  Continue eliquis at discharge    Hypertension  Elevated, up to 176/110  Stable  Permissive hypertension (OK if < 220/120) but gradually normalize in 5-7 days  Long-term BP goal normotensive  Hyperlipidemia  Home meds:  lipitor 40, resumed in hospital  LDL 53, goal < 70  Continue statin at discharge  Other Stroke Risk Factors  Advanced age  Hx ETOH use, denies recent use  Hx smoking, he stopped 3 years ago  Overweight, Body mass index is 28.77 kg/m., recommend weight loss, diet and exercise as appropriate   CAD  Other Active Problems  VT w/ multiple shocks, has defibrillator/ICD  Hypothyroidism  Hx gout  Hospital day # 1  Neurology will sign off. Please call with questions. [facility]. Thanks for the consult.  Marvel PlanJindong Jaquelyne Firkus, MD PhD Stroke Neurology 01/22/2016 2:20 AM  To contact Stroke Continuity provider, please refer to [email or web address]. After hours, contact General Neurology

## 2016-01-21 NOTE — Consult Note (Addendum)
WOC Nurse wound consult note Reason for Consult: Consult requested for right leg.  Pt states he had a fall about a month ago and the wound is not healing. Wound type: Full thickness abrasion 2X1.2cm Wound bed: 100% black outer wound, no odor or drainage. Periwound: Intact skin surrounding wound Dressing procedure/placement/frequency: Gently removed outer loose scab from wound, which revealed red moist wound bed.  Pt plans to discharge home today.  Discussed wound care after discharge; protect area with foam dressing or bandaid, shower with it open to air, avoid using peroxide or alcohol and cleanse with antibacterial soap and water. He may use antibiotic ointment to promote moist healing. Extra foam dressing provided to patient and he verbalized understanding. Please re-consult if further assistance is needed.  Thank-you,  Cammie Mcgee MSN, RN, CWOCN, [place], CNS [phone]

## 2016-01-21 NOTE — Progress Notes (Signed)
VASCULAR LAB PRELIMINARY  PRELIMINARY  PRELIMINARY  PRELIMINARY  Carotid duplex completed.    Preliminary report:  1-39% ICA plaquing.  Vertebral artery flow is antegrade.  Derionna Salvador, RVT 01/21/2016, 10:50 AM

## 2016-01-22 DIAGNOSIS — I4891 Unspecified atrial fibrillation: Secondary | ICD-10-CM | POA: Diagnosis not present

## 2016-01-22 DIAGNOSIS — I5032 Chronic diastolic (congestive) heart failure: Secondary | ICD-10-CM | POA: Diagnosis not present

## 2016-01-22 DIAGNOSIS — I69351 Hemiplegia and hemiparesis following cerebral infarction affecting right dominant side: Secondary | ICD-10-CM | POA: Diagnosis not present

## 2016-01-22 DIAGNOSIS — F1010 Alcohol abuse, uncomplicated: Secondary | ICD-10-CM | POA: Diagnosis not present

## 2016-01-22 DIAGNOSIS — Z95810 Presence of automatic (implantable) cardiac defibrillator: Secondary | ICD-10-CM | POA: Diagnosis not present

## 2016-01-22 DIAGNOSIS — I2510 Atherosclerotic heart disease of native coronary artery without angina pectoris: Secondary | ICD-10-CM | POA: Diagnosis not present

## 2016-01-22 DIAGNOSIS — Z87891 Personal history of nicotine dependence: Secondary | ICD-10-CM | POA: Diagnosis not present

## 2016-01-22 DIAGNOSIS — Z8673 Personal history of transient ischemic attack (TIA), and cerebral infarction without residual deficits: Secondary | ICD-10-CM | POA: Diagnosis not present

## 2016-01-22 DIAGNOSIS — I110 Hypertensive heart disease with heart failure: Secondary | ICD-10-CM | POA: Diagnosis not present

## 2016-01-22 DIAGNOSIS — Z7901 Long term (current) use of anticoagulants: Secondary | ICD-10-CM | POA: Diagnosis not present

## 2016-01-22 LAB — HEMOGLOBIN A1C
HEMOGLOBIN A1C: 4.8 % (ref 4.8–5.6)
MEAN PLASMA GLUCOSE: 91 mg/dL

## 2016-01-23 ENCOUNTER — Ambulatory Visit (INDEPENDENT_AMBULATORY_CARE_PROVIDER_SITE_OTHER): Payer: PPO | Admitting: *Deleted

## 2016-01-23 DIAGNOSIS — Z95810 Presence of automatic (implantable) cardiac defibrillator: Secondary | ICD-10-CM | POA: Diagnosis not present

## 2016-01-23 DIAGNOSIS — I5022 Chronic systolic (congestive) heart failure: Secondary | ICD-10-CM | POA: Diagnosis not present

## 2016-01-23 DIAGNOSIS — I472 Ventricular tachycardia, unspecified: Secondary | ICD-10-CM

## 2016-01-23 NOTE — Progress Notes (Signed)
Remote ICD transmission.   

## 2016-01-24 NOTE — Progress Notes (Signed)
EPIC Encounter for ICM Monitoring  Patient Name: Don [name] is a 70 y.o. male Date: 01/24/2016 Primary Care Physican: Phineas Inches, MD Primary Cardiologist:Taylor Electrophysiologist: Druscilla Brownie Weight:Does not weigh Bi-V Pacing: 90.8%     Heart Failure questions reviewed, pt asymptomatic.  Patient had ER visit on 11/10 for rib fracture from fall and 12/3 for ischemic stroke.  He stated he received IV fluids during hospitalization which may be why there is some fluid accumulation  Thoracic impedance abnormal suggesting fluid accumulation since 01/19/2016 but trending back toward baseline.  He stated he probably has also drank too much fluid.    LABS: 01/20/2016 Creatinine 0.92, BUN 15, Potassium 3.9, Sodium 141, EGFR >60 12/28/2015 Creatinine 0.90, BUN 21, Potassium 3.7, Sodium 140 10/15/2015 Creatinine 0.83, BUN 10, Potassium 4.4, Sodium 141  10/14/2015 Creatinine 1.25, BUN 16, Potassium 3.8, Sodium 143 10/13/2015 Creatinine 3.54, BUN 24, Potassium 3.1, Sodium 140 10/12/2015 Creatinine 4.75, BUN 27, Potassium 3.1, Sodium 139 10/12/2015 Creatinine 6.39, BUN 31, Potassium 2.8, Sodium 137 09/20/2015 Creatinine 0.66, BUN <5, Potassium 4.1, Sodium 139  Recommendations: No changes at this time since impedance is trending back toward baseline.  Reinforced to limit low salt food choices to 2000 mg day and limiting fluid intake to < 2 liters per day. Encouraged to call for fluid symptoms.    Follow-up plan: ICM clinic phone appointment on 01/31/2016 to recheck fluid levels.  Copy of ICM check sent to device physician.   ICM trend: 01/23/2016       Rosalene Billings, RN 01/24/2016 11:01 AM

## 2016-01-28 DIAGNOSIS — I10 Essential (primary) hypertension: Secondary | ICD-10-CM | POA: Diagnosis not present

## 2016-01-28 DIAGNOSIS — G459 Transient cerebral ischemic attack, unspecified: Secondary | ICD-10-CM | POA: Diagnosis not present

## 2016-01-30 ENCOUNTER — Encounter: Payer: Self-pay | Admitting: Cardiology

## 2016-01-31 ENCOUNTER — Ambulatory Visit (INDEPENDENT_AMBULATORY_CARE_PROVIDER_SITE_OTHER): Payer: PPO

## 2016-01-31 DIAGNOSIS — Z95810 Presence of automatic (implantable) cardiac defibrillator: Secondary | ICD-10-CM

## 2016-01-31 DIAGNOSIS — I5022 Chronic systolic (congestive) heart failure: Secondary | ICD-10-CM

## 2016-02-01 NOTE — Progress Notes (Signed)
EPIC Encounter for ICM Monitoring  Patient Name: Don [name] is a 70 y.o. male Date: 02/01/2016 Primary Care Physican: Phineas Inches, MD Primary Cardiologist:Taylor Electrophysiologist: Druscilla Brownie Weight:Does not weigh Bi-V Pacing: 93.7%        Heart Failure questions reviewed, pt asymptomatic.  Feels like fx ribs are healing and getting better.    Thoracic impedance returned to normal   LABS: 01/20/2016 Creatinine 0.92, BUN 15, Potassium 3.9, Sodium 141, EGFR >60 12/28/2015 Creatinine 0.90, BUN 21, Potassium 3.7, Sodium 140 10/15/2015 Creatinine 0.83, BUN 10, Potassium 4.4, Sodium 141  10/14/2015 Creatinine 1.25, BUN 16, Potassium 3.8, Sodium 143 10/13/2015 Creatinine 3.54, BUN 24, Potassium 3.1, Sodium 140 10/12/2015 Creatinine 4.75, BUN 27, Potassium 3.1, Sodium 139 10/12/2015 Creatinine 6.39, BUN 31, Potassium 2.8, Sodium 137       09/20/2015 Creatinine 0.66, BUN <5, Potassium 4.1, Sodium 139  Recommendations:  No changes.  Encouraged to call for fluid symptoms.    Follow-up plan: ICM clinic phone appointment on 02/26/2016.  Copy of ICM check sent to device physician.   ICM trend: 01/31/2016   *    Rosalene Billings, RN 02/01/2016 10:37 AM

## 2016-02-19 LAB — CUP PACEART REMOTE DEVICE CHECK
Brady Statistic AP VS Percent: 0.03 %
Brady Statistic AS VS Percent: 4.39 %
Date Time Interrogation Session: 20171206062509
HIGH POWER IMPEDANCE MEASURED VALUE: 45 Ohm
HIGH POWER IMPEDANCE MEASURED VALUE: 56 Ohm
HIGH POWER IMPEDANCE MEASURED VALUE: 665 Ohm
Implantable Lead Implant Date: 20080710
Implantable Lead Implant Date: 20080710
Implantable Lead Location: 753859
Implantable Pulse Generator Implant Date: 20131107
Lead Channel Impedance Value: 247 Ohm
Lead Channel Impedance Value: 418 Ohm
Lead Channel Impedance Value: 722 Ohm
Lead Channel Pacing Threshold Amplitude: 0.625 V
Lead Channel Pacing Threshold Amplitude: 1.125 V
Lead Channel Pacing Threshold Pulse Width: 0.4 ms
Lead Channel Pacing Threshold Pulse Width: 0.4 ms
Lead Channel Sensing Intrinsic Amplitude: 2.125 mV
Lead Channel Sensing Intrinsic Amplitude: 3.5 mV
Lead Channel Sensing Intrinsic Amplitude: 3.5 mV
Lead Channel Setting Pacing Amplitude: 2 V
Lead Channel Setting Pacing Amplitude: 2.25 V
Lead Channel Setting Pacing Pulse Width: 0.4 ms
Lead Channel Setting Pacing Pulse Width: 0.4 ms
MDC IDC LEAD IMPLANT DT: 20080710
MDC IDC LEAD LOCATION: 753858
MDC IDC LEAD LOCATION: 753860
MDC IDC LEAD MODEL: 4194
MDC IDC MSMT BATTERY VOLTAGE: 2.95 V
MDC IDC MSMT LEADCHNL LV IMPEDANCE VALUE: 551 Ohm
MDC IDC MSMT LEADCHNL RA IMPEDANCE VALUE: 513 Ohm
MDC IDC MSMT LEADCHNL RA PACING THRESHOLD AMPLITUDE: 0.75 V
MDC IDC MSMT LEADCHNL RA SENSING INTR AMPL: 2.125 mV
MDC IDC MSMT LEADCHNL RV PACING THRESHOLD PULSEWIDTH: 0.4 ms
MDC IDC SET LEADCHNL RV PACING AMPLITUDE: 2.5 V
MDC IDC SET LEADCHNL RV SENSING SENSITIVITY: 0.3 mV
MDC IDC STAT BRADY AP VP PERCENT: 24.58 %
MDC IDC STAT BRADY AS VP PERCENT: 71 %
MDC IDC STAT BRADY RA PERCENT PACED: 23.4 %
MDC IDC STAT BRADY RV PERCENT PACED: 90.78 %

## 2016-02-26 ENCOUNTER — Ambulatory Visit (INDEPENDENT_AMBULATORY_CARE_PROVIDER_SITE_OTHER): Payer: PPO

## 2016-02-26 ENCOUNTER — Telehealth: Payer: Self-pay

## 2016-02-26 DIAGNOSIS — I5022 Chronic systolic (congestive) heart failure: Secondary | ICD-10-CM

## 2016-02-26 DIAGNOSIS — Z95810 Presence of automatic (implantable) cardiac defibrillator: Secondary | ICD-10-CM | POA: Diagnosis not present

## 2016-02-26 NOTE — Telephone Encounter (Signed)
Remote ICM transmission received.  Attempted patient call and no answer or answering machine.  

## 2016-02-26 NOTE — Progress Notes (Signed)
EPIC Encounter for ICM Monitoring  Patient Name: Don [name] is a 71 y.o. male Date: 02/26/2016 Primary Care Physican: Phineas Inches, MD Primary Cardiologist:Taylor Electrophysiologist: Druscilla Brownie Weight:Does not weigh Bi-V Pacing: 93.5%                                                Attempted ICM call and unable to reach.   Transmission reviewed.   Thoracic impedance is abnormal suggesting fluid accumulation  LABS: 01/20/2016 Creatinine 0.92, BUN 15, Potassium 3.9, Sodium 141, EGFR >60 12/28/2015 Creatinine 0.90, BUN 21, Potassium 3.7, Sodium 140 10/15/2015 Creatinine 0.83, BUN 10, Potassium 4.4, Sodium 141  10/14/2015 Creatinine 1.25, BUN 16, Potassium 3.8, Sodium 143 10/13/2015 Creatinine 3.54, BUN 24, Potassium 3.1, Sodium 140 10/12/2015 Creatinine 4.75, BUN 27, Potassium 3.1, Sodium 139 10/12/2015 Creatinine 6.39, BUN 31, Potassium 2.8, Sodium 137       09/20/2015 Creatinine 0.66, BUN <5, Potassium 4.1, Sodium 139  Recommendations:  None - unable to reach, no answer     Follow-up plan: ICM clinic phone appointment on 03/07/2016 to recheck fluid levels.  Copy of ICM check sent to device physician.   3 month ICM trend : 02/26/2016   1 Year ICM trend:      Rosalene Billings, RN 02/26/2016 10:13 AM

## 2016-03-07 ENCOUNTER — Ambulatory Visit (INDEPENDENT_AMBULATORY_CARE_PROVIDER_SITE_OTHER): Payer: PPO

## 2016-03-07 DIAGNOSIS — I5022 Chronic systolic (congestive) heart failure: Secondary | ICD-10-CM

## 2016-03-07 DIAGNOSIS — Z95810 Presence of automatic (implantable) cardiac defibrillator: Secondary | ICD-10-CM

## 2016-03-10 ENCOUNTER — Telehealth: Payer: Self-pay

## 2016-03-10 NOTE — Telephone Encounter (Signed)
Remote ICM transmission received.  Attempted patient call and no answer or answering machine.  

## 2016-03-10 NOTE — Progress Notes (Signed)
EPIC Encounter for ICM Monitoring  Patient Name: Don [name] is a 71 y.o. male Date: 03/10/2016 Primary Care Physican: Phineas Inches, MD Primary Cardiologist:Taylor Electrophysiologist: Druscilla Brownie Weight:Does not weigh Bi-V Pacing: 92.6%       Attempted ICM call and unable to reach.  Transmission reviewed.   Thoracic impedance abnormal suggesting fluid accumulation since 02/29/2016.  LABS: 01/20/2016 Creatinine 0.92, BUN 15, Potassium 3.9, Sodium 141, EGFR >60 12/28/2015 Creatinine 0.90, BUN 21, Potassium 3.7, Sodium 140 10/15/2015 Creatinine 0.83, BUN 10, Potassium 4.4, Sodium 141  10/14/2015 Creatinine 1.25, BUN 16, Potassium 3.8, Sodium 143 10/13/2015 Creatinine 3.54, BUN 24, Potassium 3.1, Sodium 140 10/12/2015 Creatinine 4.75, BUN 27, Potassium 3.1, Sodium 139 10/12/2015 Creatinine 6.39, BUN 31, Potassium 2.8, Sodium 137 09/20/2015 Creatinine 0.66, BUN <5, Potassium 4.1, Sodium 139  Recommendations: None, unable to reach.    Follow-up plan: ICM clinic phone appointment on 03/20/2016 to recheck fluid levels.  Copy of ICM check sent to device physician.   3 month ICM trend: 03/07/2016   1 Year ICM trend:      Rosalene Billings, RN 03/10/2016 9:23 AM

## 2016-03-20 ENCOUNTER — Telehealth: Payer: Self-pay

## 2016-03-20 ENCOUNTER — Ambulatory Visit (INDEPENDENT_AMBULATORY_CARE_PROVIDER_SITE_OTHER): Payer: PPO

## 2016-03-20 DIAGNOSIS — I5022 Chronic systolic (congestive) heart failure: Secondary | ICD-10-CM

## 2016-03-20 DIAGNOSIS — Z95810 Presence of automatic (implantable) cardiac defibrillator: Secondary | ICD-10-CM

## 2016-03-20 NOTE — Telephone Encounter (Signed)
Remote ICM transmission received.  Attempted patient call and no answer or answering machine.  

## 2016-03-20 NOTE — Progress Notes (Signed)
EPIC Encounter for ICM Monitoring  Patient Name: Don [name] is a 71 y.o. male Date: 03/20/2016 Primary Care Physican: Phineas Inches, MD Primary Cardiologist:Taylor Electrophysiologist: Druscilla Brownie Weight:Does not weigh Bi-V Pacing: 93.1%                                                  Attempted call to patient and unable to reach.   Transmission reviewed.   Thoracic impedance normal .  LABS: 01/20/2016 Creatinine 0.92, BUN 15, Potassium 3.9, Sodium 141, EGFR >60 12/28/2015 Creatinine 0.90, BUN 21, Potassium 3.7, Sodium 140 10/15/2015 Creatinine 0.83, BUN 10, Potassium 4.4, Sodium 141  10/14/2015 Creatinine 1.25, BUN 16, Potassium 3.8, Sodium 143 10/13/2015 Creatinine 3.54, BUN 24, Potassium 3.1, Sodium 140 10/12/2015 Creatinine 4.75, BUN 27, Potassium 3.1, Sodium 139 10/12/2015 Creatinine 6.39, BUN 31, Potassium 2.8, Sodium 137 09/20/2015 Creatinine 0.66, BUN <5, Potassium 4.1, Sodium 139  Recommendations: NONE - Unable to reach patient   Follow-up plan: ICM clinic phone appointment on 04/23/2016.  Copy of ICM check sent to device physician.   3 month ICM trend: 03/20/2016   1 Year ICM trend:      Rosalene Billings, RN 03/20/2016 12:02 PM

## 2016-04-23 ENCOUNTER — Ambulatory Visit (INDEPENDENT_AMBULATORY_CARE_PROVIDER_SITE_OTHER): Payer: PPO | Admitting: *Deleted

## 2016-04-23 DIAGNOSIS — Z95810 Presence of automatic (implantable) cardiac defibrillator: Secondary | ICD-10-CM

## 2016-04-23 DIAGNOSIS — I472 Ventricular tachycardia, unspecified: Secondary | ICD-10-CM

## 2016-04-23 DIAGNOSIS — I5022 Chronic systolic (congestive) heart failure: Secondary | ICD-10-CM

## 2016-04-23 NOTE — Progress Notes (Signed)
Remote ICD transmission.   

## 2016-04-24 ENCOUNTER — Encounter: Payer: Self-pay | Admitting: Cardiology

## 2016-04-25 ENCOUNTER — Telehealth: Payer: Self-pay

## 2016-04-25 LAB — CUP PACEART REMOTE DEVICE CHECK
Battery Voltage: 2.93 V
Brady Statistic AS VS Percent: 2.98 %
Brady Statistic RA Percent Paced: 18.27 %
Date Time Interrogation Session: 20180307062508
HIGH POWER IMPEDANCE MEASURED VALUE: 47 Ohm
HighPow Impedance: 58 Ohm
HighPow Impedance: 608 Ohm
Implantable Lead Implant Date: 20080710
Implantable Lead Implant Date: 20080710
Implantable Lead Location: 753858
Implantable Lead Location: 753859
Implantable Lead Location: 753860
Implantable Lead Model: 4194
Implantable Lead Model: 6947
Implantable Pulse Generator Implant Date: 20131107
Lead Channel Impedance Value: 456 Ohm
Lead Channel Pacing Threshold Amplitude: 0.5 V
Lead Channel Pacing Threshold Amplitude: 1.125 V
Lead Channel Pacing Threshold Pulse Width: 0.4 ms
Lead Channel Pacing Threshold Pulse Width: 0.4 ms
Lead Channel Sensing Intrinsic Amplitude: 1.75 mV
Lead Channel Sensing Intrinsic Amplitude: 4.25 mV
Lead Channel Sensing Intrinsic Amplitude: 4.25 mV
Lead Channel Setting Pacing Amplitude: 2.25 V
Lead Channel Setting Pacing Amplitude: 2.5 V
MDC IDC LEAD IMPLANT DT: 20080710
MDC IDC MSMT LEADCHNL LV IMPEDANCE VALUE: 285 Ohm
MDC IDC MSMT LEADCHNL LV IMPEDANCE VALUE: 551 Ohm
MDC IDC MSMT LEADCHNL RA IMPEDANCE VALUE: 475 Ohm
MDC IDC MSMT LEADCHNL RA PACING THRESHOLD AMPLITUDE: 0.875 V
MDC IDC MSMT LEADCHNL RA PACING THRESHOLD PULSEWIDTH: 0.4 ms
MDC IDC MSMT LEADCHNL RA SENSING INTR AMPL: 1.75 mV
MDC IDC MSMT LEADCHNL RV IMPEDANCE VALUE: 703 Ohm
MDC IDC SET LEADCHNL LV PACING PULSEWIDTH: 0.4 ms
MDC IDC SET LEADCHNL RA PACING AMPLITUDE: 2 V
MDC IDC SET LEADCHNL RV PACING PULSEWIDTH: 0.4 ms
MDC IDC SET LEADCHNL RV SENSING SENSITIVITY: 0.3 mV
MDC IDC STAT BRADY AP VP PERCENT: 18.98 %
MDC IDC STAT BRADY AP VS PERCENT: 0.03 %
MDC IDC STAT BRADY AS VP PERCENT: 78.02 %
MDC IDC STAT BRADY RV PERCENT PACED: 93.45 %

## 2016-04-25 NOTE — Progress Notes (Signed)
EPIC Encounter for ICM Monitoring  Patient Name: Don [name] is a 71 y.o. male Date: 04/25/2016 Primary Care Physican: Phineas Inches, MD Primary Cardiologist:Taylor Electrophysiologist: Druscilla Brownie Weight:Does not weigh Bi-V Pacing: 93.4%       Attempted call to patient and unable to reach.   Transmission reviewed.    Thoracic impedance normal.  Prescribed dosage: Furosemide 20 mg 1 tablet daily.  Potassium 10 mEq 1 tablet Monday, Wednesday, Friday  LABS: 01/20/2016 Creatinine 0.92, BUN 15, Potassium 3.9, Sodium 141, EGFR >60 12/28/2015 Creatinine 0.90, BUN 21, Potassium 3.7, Sodium 140 10/15/2015 Creatinine 0.83, BUN 10, Potassium 4.4, Sodium 141  10/14/2015 Creatinine 1.25, BUN 16, Potassium 3.8, Sodium 143 10/13/2015 Creatinine 3.54, BUN 24, Potassium 3.1, Sodium 140 10/12/2015 Creatinine 4.75, BUN 27, Potassium 3.1, Sodium 139 10/12/2015 Creatinine 6.39, BUN 31, Potassium 2.8, Sodium 137 09/20/2015 Creatinine 0.66, BUN <5, Potassium 4.1, Sodium 139  Recommendations: NONE - Unable to reach patient   Follow-up plan: ICM clinic phone appointment on 05/26/2016.  Copy of ICM check sent to device physician.   3 month ICM trend: 04/25/2016   1 Year ICM trend:      Rosalene Billings, RN 04/25/2016 10:19 AM

## 2016-04-25 NOTE — Telephone Encounter (Signed)
Remote ICM transmission received.  Attempted patient call and no answer or answering machine.  

## 2016-05-26 ENCOUNTER — Ambulatory Visit (INDEPENDENT_AMBULATORY_CARE_PROVIDER_SITE_OTHER): Payer: PPO

## 2016-05-26 DIAGNOSIS — I5022 Chronic systolic (congestive) heart failure: Secondary | ICD-10-CM | POA: Diagnosis not present

## 2016-05-26 DIAGNOSIS — Z95810 Presence of automatic (implantable) cardiac defibrillator: Secondary | ICD-10-CM | POA: Diagnosis not present

## 2016-05-26 NOTE — Progress Notes (Signed)
EPIC Encounter for ICM Monitoring  Patient Name: Don [name] is a 71 y.o. male Date: 05/26/2016 Primary Care Physican: Phineas Inches, MD Primary Cardiologist:Taylor Electrophysiologist: Druscilla Brownie Weight:Does not weigh Bi-V Pacing: 92.5%       Heart Failure questions reviewed, pt asymptomatic.   Thoracic impedance normal but was abnormal suggesting fluid accumulation from ~05/02/2016 to 05/16/2016.  Prescribed dosage: Furosemide 20 mg 1 tablet daily.  Potassium 10 mEq 1 tablet Monday, Wednesday, Friday.  LABS: 01/20/2016 Creatinine 0.92, BUN 15, Potassium 3.9, Sodium 141, EGFR >60 12/28/2015 Creatinine 0.90, BUN 21, Potassium 3.7, Sodium 140 10/15/2015 Creatinine 0.83, BUN 10, Potassium 4.4, Sodium 141  10/14/2015 Creatinine 1.25, BUN 16, Potassium 3.8, Sodium 143 10/13/2015 Creatinine 3.54, BUN 24, Potassium 3.1, Sodium 140 10/12/2015 Creatinine 4.75, BUN 27, Potassium 3.1, Sodium 139 10/12/2015 Creatinine 6.39, BUN 31, Potassium 2.8, Sodium 137 09/20/2015 Creatinine 0.66, BUN <5, Potassium 4.1, Sodium 139  Recommendations: No changes. Discussed to limit salt intake to 2000 mg/day and fluid intake to < 2 liters/day.  Encouraged to call for fluid symptoms.  Follow-up plan: ICM clinic phone appointment on 06/26/2016.    Copy of ICM check sent to device physician.   3 month ICM trend: 05/26/2016   1 Year ICM trend:      Rosalene Billings, RN 05/26/2016 7:57 AM

## 2016-06-26 ENCOUNTER — Ambulatory Visit (INDEPENDENT_AMBULATORY_CARE_PROVIDER_SITE_OTHER): Payer: PPO

## 2016-06-26 DIAGNOSIS — Z95810 Presence of automatic (implantable) cardiac defibrillator: Secondary | ICD-10-CM | POA: Diagnosis not present

## 2016-06-26 DIAGNOSIS — I5022 Chronic systolic (congestive) heart failure: Secondary | ICD-10-CM | POA: Diagnosis not present

## 2016-06-26 NOTE — Progress Notes (Signed)
EPIC Encounter for ICM Monitoring  Patient Name: Don [name] is a 71 y.o. male Date: 06/26/2016 Primary Care Physican: Bernerd Limbo, MD Primary Cardiologist:Taylor Electrophysiologist: Druscilla Brownie Weight:Does not weigh Bi-V Pacing: 92.5%        Heart Failure questions reviewed, pt asymptomatic    Thoracic impedance normal   Prescribed dosage: Furosemide 20 mg 1 tablet daily. Potassium 10 mEq 1 tablet Monday, Wednesday, Friday.  LABS: 01/20/2016 Creatinine 0.92, BUN 15, Potassium 3.9, Sodium 141, EGFR >60 12/28/2015 Creatinine 0.90, BUN 21, Potassium 3.7, Sodium 140 10/15/2015 Creatinine 0.83, BUN 10, Potassium 4.4, Sodium 141  10/14/2015 Creatinine 1.25, BUN 16, Potassium 3.8, Sodium 143 10/13/2015 Creatinine 3.54, BUN 24, Potassium 3.1, Sodium 140 10/12/2015 Creatinine 4.75, BUN 27, Potassium 3.1, Sodium 139 10/12/2015 Creatinine 6.39, BUN 31, Potassium 2.8, Sodium 137 09/20/2015 Creatinine 0.66, BUN <5, Potassium 4.1, Sodium 139*  Recommendations: No changes. Discussed to limit salt intake to 2000 mg/day and fluid intake to < 2 liters/day.  Encouraged to call for fluid symptoms or use local ER for any urgent symptoms.  Follow-up plan: ICM clinic phone appointment on 07/29/2016.  He received recall to make June appt with Dr Lovena Le and will call the office.  Copy of ICM check sent to device physician.   3 month ICM trend: 06/26/2016   1 Year ICM trend:  [facility] website unable to generate report    Rosalene Billings, RN 06/26/2016 11:06 AM

## 2016-07-02 ENCOUNTER — Other Ambulatory Visit: Payer: Self-pay | Admitting: *Deleted

## 2016-07-20 DIAGNOSIS — L02212 Cutaneous abscess of back [any part, except buttock]: Secondary | ICD-10-CM | POA: Diagnosis not present

## 2016-07-20 DIAGNOSIS — Z23 Encounter for immunization: Secondary | ICD-10-CM | POA: Diagnosis not present

## 2016-07-23 DIAGNOSIS — L02212 Cutaneous abscess of back [any part, except buttock]: Secondary | ICD-10-CM | POA: Diagnosis not present

## 2016-07-28 DIAGNOSIS — L02212 Cutaneous abscess of back [any part, except buttock]: Secondary | ICD-10-CM | POA: Diagnosis not present

## 2016-07-28 DIAGNOSIS — I959 Hypotension, unspecified: Secondary | ICD-10-CM | POA: Diagnosis not present

## 2016-07-29 ENCOUNTER — Ambulatory Visit (INDEPENDENT_AMBULATORY_CARE_PROVIDER_SITE_OTHER): Payer: PPO

## 2016-07-29 DIAGNOSIS — Z95810 Presence of automatic (implantable) cardiac defibrillator: Secondary | ICD-10-CM

## 2016-07-29 DIAGNOSIS — I5022 Chronic systolic (congestive) heart failure: Secondary | ICD-10-CM | POA: Diagnosis not present

## 2016-07-29 NOTE — Progress Notes (Signed)
EPIC Encounter for ICM Monitoring  Patient Name: Don [name] is a 71 y.o. male Date: 07/29/2016 Primary Care Physican: Bernerd Limbo, MD Primary Cardiologist:Taylor Electrophysiologist: Druscilla Brownie Weight:Does not weigh Bi-V Pacing: 92.5% Bi-V Pacing:   89%       Heart Failure questions reviewed, pt asymptomatic.   Thoracic impedance normal but was abnormal suggesting fluid accumulation from 06/26/2016 to 07/22/2016.  Prescribed dosage: Furosemide 20 mg 1 tablet daily. Potassium 10 mEq 1 tablet Monday, Wednesday, Friday.  LABS: 01/20/2016 Creatinine 0.92, BUN 15, Potassium 3.9, Sodium 141, EGFR >60 12/28/2015 Creatinine 0.90, BUN 21, Potassium 3.7, Sodium 140 10/15/2015 Creatinine 0.83, BUN 10, Potassium 4.4, Sodium 141  10/14/2015 Creatinine 1.25, BUN 16, Potassium 3.8, Sodium 143 10/13/2015 Creatinine 3.54, BUN 24, Potassium 3.1, Sodium 140 10/12/2015 Creatinine 4.75, BUN 27, Potassium 3.1, Sodium 139 10/12/2015 Creatinine 6.39, BUN 31, Potassium 2.8, Sodium 137 09/20/2015 Creatinine 0.66, BUN <5, Potassium 4.1, Sodium 139  Recommendations: No changes.  He has been drinking more fluids than usual and advised to limit fluid intake to < 2 liters/day.  Encouraged to call for fluid symptoms.  Follow-up plan: ICM clinic phone appointment on 09/01/2016.  Advised him to call the office because he is due to make an appointment with Dr Lovena Le this month, June.    Copy of ICM check sent to device physician.   3 month ICM trend: 07/29/2016   1 Year ICM trend:      Rosalene Billings, RN 07/29/2016 8:13 AM

## 2016-09-01 ENCOUNTER — Emergency Department (HOSPITAL_COMMUNITY): Payer: PPO

## 2016-09-01 ENCOUNTER — Telehealth: Payer: Self-pay | Admitting: Cardiology

## 2016-09-01 ENCOUNTER — Encounter (HOSPITAL_COMMUNITY): Payer: Self-pay | Admitting: Emergency Medicine

## 2016-09-01 ENCOUNTER — Encounter: Payer: PPO | Admitting: *Deleted

## 2016-09-01 ENCOUNTER — Emergency Department (HOSPITAL_COMMUNITY)
Admission: EM | Admit: 2016-09-01 | Discharge: 2016-09-01 | Disposition: A | Discharge status: Home / Self Care | Payer: PPO | Attending: Emergency Medicine | Admitting: Emergency Medicine

## 2016-09-01 DIAGNOSIS — R200 Anesthesia of skin: Secondary | ICD-10-CM | POA: Insufficient documentation

## 2016-09-01 DIAGNOSIS — Z8673 Personal history of transient ischemic attack (TIA), and cerebral infarction without residual deficits: Secondary | ICD-10-CM | POA: Diagnosis not present

## 2016-09-01 DIAGNOSIS — R110 Nausea: Secondary | ICD-10-CM | POA: Insufficient documentation

## 2016-09-01 DIAGNOSIS — Z87891 Personal history of nicotine dependence: Secondary | ICD-10-CM | POA: Insufficient documentation

## 2016-09-01 DIAGNOSIS — Z79899 Other long term (current) drug therapy: Secondary | ICD-10-CM | POA: Diagnosis not present

## 2016-09-01 DIAGNOSIS — E039 Hypothyroidism, unspecified: Secondary | ICD-10-CM | POA: Insufficient documentation

## 2016-09-01 DIAGNOSIS — Z7901 Long term (current) use of anticoagulants: Secondary | ICD-10-CM | POA: Insufficient documentation

## 2016-09-01 DIAGNOSIS — I10 Essential (primary) hypertension: Secondary | ICD-10-CM | POA: Diagnosis not present

## 2016-09-01 DIAGNOSIS — Z95810 Presence of automatic (implantable) cardiac defibrillator: Secondary | ICD-10-CM | POA: Insufficient documentation

## 2016-09-01 DIAGNOSIS — I5042 Chronic combined systolic (congestive) and diastolic (congestive) heart failure: Secondary | ICD-10-CM | POA: Insufficient documentation

## 2016-09-01 DIAGNOSIS — Z7982 Long term (current) use of aspirin: Secondary | ICD-10-CM | POA: Insufficient documentation

## 2016-09-01 DIAGNOSIS — R202 Paresthesia of skin: Secondary | ICD-10-CM | POA: Diagnosis not present

## 2016-09-01 DIAGNOSIS — I110 Hypertensive heart disease with heart failure: Secondary | ICD-10-CM | POA: Insufficient documentation

## 2016-09-01 DIAGNOSIS — N189 Chronic kidney disease, unspecified: Secondary | ICD-10-CM | POA: Insufficient documentation

## 2016-09-01 DIAGNOSIS — R0602 Shortness of breath: Secondary | ICD-10-CM | POA: Diagnosis not present

## 2016-09-01 DIAGNOSIS — I2510 Atherosclerotic heart disease of native coronary artery without angina pectoris: Secondary | ICD-10-CM | POA: Diagnosis not present

## 2016-09-01 DIAGNOSIS — E079 Disorder of thyroid, unspecified: Secondary | ICD-10-CM | POA: Diagnosis not present

## 2016-09-01 LAB — CBC
HCT: 30 % — ABNORMAL LOW (ref 39.0–52.0)
Hemoglobin: 10.4 g/dL — ABNORMAL LOW (ref 13.0–17.0)
MCH: 31.6 pg (ref 26.0–34.0)
MCHC: 34.7 g/dL (ref 30.0–36.0)
MCV: 91.2 fL (ref 78.0–100.0)
PLATELETS: 109 10*3/uL — AB (ref 150–400)
RBC: 3.29 MIL/uL — ABNORMAL LOW (ref 4.22–5.81)
RDW: 15.7 % — AB (ref 11.5–15.5)
WBC: 4.4 10*3/uL (ref 4.0–10.5)

## 2016-09-01 LAB — URINALYSIS, ROUTINE W REFLEX MICROSCOPIC
BILIRUBIN URINE: NEGATIVE
Glucose, UA: NEGATIVE mg/dL
Hgb urine dipstick: NEGATIVE
KETONES UR: NEGATIVE mg/dL
Leukocytes, UA: NEGATIVE
NITRITE: NEGATIVE
Protein, ur: NEGATIVE mg/dL
SPECIFIC GRAVITY, URINE: 1.005 (ref 1.005–1.030)
pH: 7 (ref 5.0–8.0)

## 2016-09-01 LAB — APTT: APTT: 34 s (ref 24–36)

## 2016-09-01 LAB — DIFFERENTIAL
BASOS ABS: 0 10*3/uL (ref 0.0–0.1)
BASOS PCT: 1 %
EOS ABS: 0 10*3/uL (ref 0.0–0.7)
EOS PCT: 1 %
Lymphocytes Relative: 23 %
Lymphs Abs: 1 10*3/uL (ref 0.7–4.0)
Monocytes Absolute: 0.6 10*3/uL (ref 0.1–1.0)
Monocytes Relative: 13 %
NEUTROS PCT: 63 %
Neutro Abs: 2.8 10*3/uL (ref 1.7–7.7)

## 2016-09-01 LAB — COMPREHENSIVE METABOLIC PANEL
ALBUMIN: 3.1 g/dL — AB (ref 3.5–5.0)
ALT: 38 U/L (ref 17–63)
ANION GAP: 8 (ref 5–15)
AST: 101 U/L — ABNORMAL HIGH (ref 15–41)
Alkaline Phosphatase: 75 U/L (ref 38–126)
BILIRUBIN TOTAL: 1 mg/dL (ref 0.3–1.2)
BUN: 5 mg/dL — ABNORMAL LOW (ref 6–20)
CHLORIDE: 90 mmol/L — AB (ref 101–111)
CO2: 30 mmol/L (ref 22–32)
Calcium: 7.9 mg/dL — ABNORMAL LOW (ref 8.9–10.3)
Creatinine, Ser: 1.01 mg/dL (ref 0.61–1.24)
GFR calc Af Amer: >60 mL/min (ref >60)
GFR calc non Af Amer: >60 mL/min (ref >60)
GLUCOSE: 97 mg/dL (ref 65–99)
POTASSIUM: 3.7 mmol/L (ref 3.5–5.1)
SODIUM: 128 mmol/L — AB (ref 135–145)
TOTAL PROTEIN: 5.9 g/dL — AB (ref 6.5–8.1)

## 2016-09-01 LAB — I-STAT CHEM 8, ED
BUN: 7 mg/dL (ref 6–20)
CHLORIDE: 87 mmol/L — AB (ref 101–111)
Calcium, Ion: 0.98 mmol/L — ABNORMAL LOW (ref 1.15–1.40)
Creatinine, Ser: 0.9 mg/dL (ref 0.61–1.24)
Glucose, Bld: 100 mg/dL — ABNORMAL HIGH (ref 65–99)
HEMATOCRIT: 33 % — AB (ref 39.0–52.0)
Hemoglobin: 11.2 g/dL — ABNORMAL LOW (ref 13.0–17.0)
Potassium: 3.7 mmol/L (ref 3.5–5.1)
SODIUM: 131 mmol/L — AB (ref 135–145)
TCO2: 32 mmol/L (ref 0–100)

## 2016-09-01 LAB — BRAIN NATRIURETIC PEPTIDE: B Natriuretic Peptide: 67.5 pg/mL (ref 0.0–100.0)

## 2016-09-01 LAB — PROTIME-INR
INR: 1.39
Prothrombin Time: 17.2 seconds — ABNORMAL HIGH (ref 11.4–15.2)

## 2016-09-01 LAB — I-STAT TROPONIN, ED
TROPONIN I, POC: 0 ng/mL (ref 0.00–0.08)
Troponin i, poc: 0.01 ng/mL (ref 0.00–0.08)

## 2016-09-01 LAB — CBG MONITORING, ED: GLUCOSE-CAPILLARY: 105 mg/dL — AB (ref 65–99)

## 2016-09-01 MED ORDER — IOPAMIDOL (ISOVUE-370) INJECTION 76%
INTRAVENOUS | Status: AC
  Administered 2016-09-01: 50 mL
  Filled 2016-09-01: qty 50

## 2016-09-01 MED ORDER — LACTATED RINGERS IV BOLUS (SEPSIS)
500.0000 mL | Freq: Once | INTRAVENOUS | Status: AC
  Administered 2016-09-01: 500 mL via INTRAVENOUS

## 2016-09-01 MED ORDER — METOCLOPRAMIDE HCL 5 MG/ML IJ SOLN
10.0000 mg | Freq: Once | INTRAMUSCULAR | Status: AC
  Administered 2016-09-01: 10 mg via INTRAVENOUS
  Filled 2016-09-01: qty 2

## 2016-09-01 NOTE — ED Notes (Signed)
Pt has returned from CT.  

## 2016-09-01 NOTE — ED Triage Notes (Signed)
Pt here from home with left sided face numbness and arm tingling, has gotten better since arrival , pt also c/o nausea which he received 4 mg of zofran by ems

## 2016-09-01 NOTE — Telephone Encounter (Signed)
Spoke with pt and reminded pt of remote transmission that is due today. Pt verbalized understanding.   

## 2016-09-01 NOTE — ED Notes (Signed)
Pt ambulatory with steady gait with 2 stand-by assist witnessed by Dr. Clayborne Dana. Pt states he feels fine.

## 2016-09-01 NOTE — ED Notes (Signed)
Pt transported to CT at this time.

## 2016-09-01 NOTE — ED Notes (Signed)
CBG 105 

## 2016-09-01 NOTE — ED Provider Notes (Signed)
[facility]-EMERGENCY DEPT Provider Note   CSN: [id number] Arrival date & time: 09/01/16  1221     History   Chief Complaint Chief Complaint  Patient presents with  . Numbness    HPI Don [name] is a 71 y.o. male.  HPI 71 year old male who presents with left face and left arm tingling. He has a history of nonischemic cardiomyopathy with AICD, prior stroke/TIA with residual right facial droop, CKD. Takes Eliquis. Reports feeling generally weak and nauseous throughout the day today. Thinks that maybe around 9:30 to 10:30 AM he had tingling over the left side of his cheek as well as his left arm. Denies facial droop, speech changes, vision changes, focal weakness. States that over the past day he has had a few episodes of dry heaving and nausea. States feeling slightly short of breath today with dyspnea on exertion. No lower extremity edema, orthopnea, PND, or chest pain.  Past Medical History:  Diagnosis Date  . Alcohol abuse    PREVIOUS H/O DRINKING A FIFTH OF VODKA  A DAY  . Anginal pain (HCC)   . Arthritis    hands & back  . CHF (congestive heart failure) (HCC)   . Chronic kidney disease    acute renal  04/2012  . Chronic systolic heart failure (HCC)    LEFT  . Coronary artery disease   . History of angina   . Hypertension   . ICD (implantable cardiac defibrillator) in place   . LBBB (left bundle branch block)   . Nonischemic cardiomyopathy (HCC)    EF 20% s/p INSERTION OF BI-V DEFIBRILLATOR.  . Stroke (HCC)    TIA's  . Thyroid disease    HYPOTHYROIDISM.....TREATED  . TIA (transient ischemic attack)     Patient Active Problem List   Diagnosis Date Noted  . Encounter for therapeutic drug monitoring 11/19/2015  . Atrial fibrillation (HCC) [I48.91] 11/19/2015  . Thrombocytopenia (HCC) 10/15/2015  . Sepsis (HCC) 10/12/2015  . Pain in scapula 10/12/2015  . Collagenous colitis 10/12/2015  . Dizziness and giddiness   . History of stroke 09/18/2015  . HLD  (hyperlipidemia) 09/18/2015  . Hypothyroid 09/18/2015  . Essential hypertension 09/18/2015  . Tobacco use 09/18/2015  . 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 Hypocalcemia 02/13/2014  . Lymphadenopathy 09/28/2013  . Transient cerebral ischemia 08/16/2012  . Hemorrhoids with complication 04/29/2012  . Acute renal failure (HCC) 04/26/2012  . Hypovolemic shock (HCC) 04/26/2012  . Rectal bleeding 04/26/2012  . Diarrhea 04/26/2012  . Hypokalemia 04/26/2012  . Right rib fracture 04/26/2012  . Fall 04/26/2012  . Dehydration 04/26/2012  . ETOH abuse   . Acute ischemic stroke (HCC)   . 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   . Biventricular implantable cardioverter-defibrillator in situ 07/10/2010  . Chronic combined systolic (congestive) and diastolic (congestive) heart failure (HCC) 07/10/2010  . Ventricular tachycardia (HCC) 07/10/2010    Past Surgical History:  Procedure Laterality Date  . CARDIAC CATHETERIZATION    . CYSTOSCOPY W/ STONE MANIPULATION    . FLEXIBLE SIGMOIDOSCOPY N/A 04/29/2012   Procedure: FLEXIBLE SIGMOIDOSCOPY;  Surgeon: Vertell Novak., MD;  Location: [facility] ENDOSCOPY;  Service: Endoscopy;  Laterality: N/A;  . IMPLANTABLE CARDIOVERTER DEFIBRILLATOR (ICD) GENERATOR CHANGE Bilateral 12/25/2011   Procedure: ICD GENERATOR CHANGE;  Surgeon: Marinus Maw, MD;  Location: [facility] CATH LAB;  Service: Cardiovascular;  Laterality: Bilateral;  . INSERT / REPLACE / REMOVE PACEMAKER     BI-V IMPLANTABLE CARDIOVERTER-DEFIBRILLATOR. ; MEDTRONIC CONCERTO; MODEL  #  [id number], SERIAL # [id number] H.  DR. Humberto Leep. EDMUNDS.  . TONSILLECTOMY    . VASECTOMY         Home Medications    Prior to Admission medications   Medication Sig Start Date End Date Taking? Authorizing Provider  allopurinol (ZYLOPRIM) 100 MG tablet Take 100 mg by mouth daily.    Yes [provider]  apixaban (ELIQUIS) 5 MG TABS tablet Take 1 tablet (5 mg total) by mouth 2 (two) times daily.  01/21/16  Yes Ghimire, Werner Lean, MD  atorvastatin (LIPITOR) 40 MG tablet Take 40 mg by mouth at bedtime.  09/03/11  Yes [provider]  benazepril (LOTENSIN) 10 MG tablet Take 10 mg by mouth daily.  07/30/15  Yes [provider]  bismuth subsalicylate (PEPTO BISMOL) 262 MG/15ML suspension Take 30 mLs by mouth every 6 (six) hours as needed (nausea).   Yes [provider]  carvedilol (COREG) 6.25 MG tablet Take 6.25 mg by mouth 2 (two) times daily with a meal.   Yes [provider]  furosemide (LASIX) 20 MG tablet Take 20 mg by mouth daily.  10/25/15  Yes [provider]  levothyroxine (SYNTHROID, LEVOTHROID) 50 MCG tablet Take 1 tablet (50 mcg total) by mouth daily before breakfast. 10/15/15  Yes Short, Thea Silversmith, MD  OVER THE COUNTER MEDICATION Take 1 tablet by mouth daily. Multi-allergy tablet    Yes [provider]  aspirin 81 MG tablet Take 1 tablet (81 mg total) by mouth daily. Patient not taking: Reported on 09/01/2016 09/20/15   Arrien, York Ram, MD  nitroGLYCERIN (NITROSTAT) 0.4 MG SL tablet Place 1 tablet (0.4 mg total) under the tongue every 5 (five) minutes as needed for chest pain. 11/19/15   Marinus Maw, MD    Family History Family History  Problem Relation Age of Onset  . Heart disease Mother   . Heart disease Father   . Hypertension Unknown     Social History Social History  Substance Use Topics  . Smoking status: Former Smoker    Packs/day: 1.50    Years: 43.00    Quit date: 02/27/2012  . Smokeless tobacco: Never Used  . Alcohol use No     Comment: H/O ALCOHOLISM PERVIOUSLY A FIFTH OF VODKA A DAY     Allergies   Sulfa antibiotics   Review of Systems Review of Systems  Respiratory: Positive for shortness of breath.   Cardiovascular: Negative for chest pain.  Gastrointestinal: Negative for abdominal pain.  Neurological: Positive for numbness. Negative for weakness.  All other systems reviewed and are  negative.    Physical Exam Updated Vital Signs BP (!) 138/94   Pulse 68   Temp 97.8 F (36.6 C) (Oral)   Resp 13   SpO2 100%   Physical Exam Physical Exam  Nursing note and vitals reviewed. Constitutional: Well developed, well nourished, non-toxic, and in no acute distress Head: Normocephalic and atraumatic.  Mouth/Throat: Oropharynx is clear and moist.  Neck: Normal range of motion. Neck supple.  Cardiovascular: Normal rate and regular rhythm.   Pulmonary/Chest: Effort normal and breath sounds normal.  Abdominal: Soft. There is no tenderness. There is no rebound and no guarding.  Musculoskeletal: Normal range of motion.  Skin: Skin is warm and dry.  Psychiatric: Cooperative Neurological:  Alert, oriented to person, place, time, and situation. Memory grossly in tact. Fluent speech. No dysarthria or aphasia.  Cranial nerves: VF are full.  No gaze deviation. Facial muscles symmetric with activation. Sensation to  light touch over face in tact grossly, but slightly diminished at the left lower face. Hearing grossly in tact. Palate elevates symmetrically. Head turn and shoulder shrug are intact. Tongue midline.  Reflexes defered.  Muscle bulk and tone normal. No pronator drift. Moves all extremities symmetrically. Sensation to light touch is in tact throughout in bilateral upper and lower extremities. Coordination reveals no dysmetria with finger to nose. .    ED Treatments / Results  Labs (all labs ordered are listed, but only abnormal results are displayed) Labs Reviewed  PROTIME-INR - Abnormal; Notable for the following:       Result Value   Prothrombin Time 17.2 (*)    All other components within normal limits  CBC - Abnormal; Notable for the following:    RBC 3.29 (*)    Hemoglobin 10.4 (*)    HCT 30.0 (*)    RDW 15.7 (*)    Platelets 109 (*)    All other components within normal limits  COMPREHENSIVE METABOLIC PANEL - Abnormal; Notable for the following:     Sodium 128 (*)    Chloride 90 (*)    BUN 5 (*)    Calcium 7.9 (*)    Total Protein 5.9 (*)    Albumin 3.1 (*)    AST 101 (*)    All other components within normal limits  CBG MONITORING, ED - Abnormal; Notable for the following:    Glucose-Capillary 105 (*)    All other components within normal limits  I-STAT CHEM 8, ED - Abnormal; Notable for the following:    Sodium 131 (*)    Chloride 87 (*)    Glucose, Bld 100 (*)    Calcium, Ion 0.98 (*)    Hemoglobin 11.2 (*)    HCT 33.0 (*)    All other components within normal limits  APTT  DIFFERENTIAL  URINALYSIS, ROUTINE W REFLEX MICROSCOPIC  BRAIN NATRIURETIC PEPTIDE  I-STAT TROPOININ, ED  I-STAT TROPOININ, ED    EKG  EKG Interpretation None       Radiology Dg Chest 2 View  Result Date: 09/01/2016 CLINICAL DATA:  Left mouth and facial numbness. EXAM: CHEST  2 VIEW COMPARISON:  12/28/2015. FINDINGS: Normal sized heart. Clear lungs with normal vascularity. Stable left subclavian pacer and AICD leads. Tortuous and mildly calcified thoracic aorta. Stable right upper lobe calcified granuloma. Diffuse osteopenia. IMPRESSION: No acute abnormality. Electronically Signed   By: Beckie Salts M.D.   On: 09/01/2016 14:44   Ct Head Wo Contrast  Result Date: 09/01/2016 CLINICAL DATA:  Left-sided numbness with nausea and vomiting EXAM: CT HEAD WITHOUT CONTRAST TECHNIQUE: Contiguous axial images were obtained from the base of the skull through the vertex without intravenous contrast. COMPARISON:  08/20/2015 FINDINGS: Brain: Mild diffuse atrophy is stable. There is no intracranial mass, hemorrhage, extra-axial fluid collection, or midline shift. There is patchy small vessel disease in the centra semiovale bilaterally. Elsewhere gray-white compartments appear normal. No evident acute infarct. Vascular: There is no hyperdense vessel. There is atherosclerotic calcification in the carotid siphon regions bilaterally. Skull: Bony calvarium appears  intact. Sinuses/Orbits: There is mucosal thickening in the multiple ethmoid air cells bilaterally. There are retention cysts in the right maxillary antrum region. Other visualized paranasal sinuses are clear. Orbits appear symmetric bilaterally. Other: Mastoid air cells are clear. IMPRESSION: Atrophy with patchy periventricular small vessel disease. No intracranial mass, hemorrhage, or extra-axial fluid collection. No acute infarct evident. Areas of arterial vascular atherosclerotic calcification. Areas of paranasal sinus disease  noted. Electronically Signed   By: Bretta Bang III M.D.   On: 09/01/2016 13:58    Procedures Procedures (including critical care time)  Medications Ordered in ED Medications  metoCLOPramide (REGLAN) injection 10 mg (10 mg Intravenous Given 09/01/16 1502)     Initial Impression / Assessment and Plan / ED Course  I have reviewed the triage vital signs and the nursing notes.  Pertinent labs & imaging results that were available during my care of the patient were reviewed by me and considered in my medical decision making (see chart for details).     71 year old male who presents with left cheek and left arm numbness and tingling that started this morning. Symptoms are resolving by the time he has arrived to the ED. Just reports tingling in the left lower face. Remainder of neurological exam is intact. CT head is not suggestive of acute intracranial bleeding. Discussed with Dr. Amada Jupiter, who recommended CTA head and neck. If unremarkable, will be stable for discharge home. To be signed out to Dr. Clayborne Anita Mcadory.   Final Clinical Impressions(s) / ED Diagnoses   Final diagnoses:  Numbness and tingling    New Prescriptions New Prescriptions   No medications on file     Lavera Guise, MD 09/02/16 [phone]

## 2016-09-04 ENCOUNTER — Encounter: Payer: Self-pay | Admitting: Cardiology

## 2016-09-05 NOTE — Progress Notes (Signed)
No ICM remote transmission received for 09/01/2016 and next ICM transmission scheduled for 09/29/2016.

## 2016-09-29 ENCOUNTER — Telehealth: Payer: Self-pay

## 2016-09-29 ENCOUNTER — Other Ambulatory Visit: Payer: Self-pay | Admitting: Internal Medicine

## 2016-09-29 NOTE — Telephone Encounter (Signed)
Attempted call to patient to requested remote transmission and no answer or answering machine.

## 2016-10-02 NOTE — Progress Notes (Signed)
No ICM remote transmission received for 09/29/2016 and next ICM transmission scheduled for 10/23/2016.

## 2016-10-03 DIAGNOSIS — I428 Other cardiomyopathies: Secondary | ICD-10-CM | POA: Diagnosis not present

## 2016-10-03 DIAGNOSIS — I959 Hypotension, unspecified: Secondary | ICD-10-CM | POA: Diagnosis not present

## 2016-10-03 DIAGNOSIS — I5042 Chronic combined systolic (congestive) and diastolic (congestive) heart failure: Secondary | ICD-10-CM | POA: Diagnosis not present

## 2016-10-03 DIAGNOSIS — F1010 Alcohol abuse, uncomplicated: Secondary | ICD-10-CM | POA: Diagnosis not present

## 2016-10-03 DIAGNOSIS — E890 Postprocedural hypothyroidism: Secondary | ICD-10-CM | POA: Diagnosis not present

## 2016-10-03 DIAGNOSIS — E860 Dehydration: Secondary | ICD-10-CM | POA: Diagnosis not present

## 2016-10-23 ENCOUNTER — Telehealth: Payer: Self-pay

## 2016-10-23 NOTE — Telephone Encounter (Signed)
No transmission received for 10/23/2016.  Attempted ICM call to patient to request transmission and no answer or answering machine.  Next ICM remote transmission scheduled for 11/25/2016

## 2016-11-01 NOTE — Progress Notes (Deleted)
Cardiology Office Note Date:  11/01/2016  Patient ID:  Don [name], Don [name] 10/01/1945, MRN [id number] PCP:  Tracey Harries, MD  Electrophysiologist:  Dr. Ladona Ridgel  ***refresh   Chief Complaint: annual EP/device visit  History of Present Illness: Don [name] is a 71 y.o. male with history of NICM, chronic CHF (systolic), LBBB w/CRT-D, hx of ETOH, VT, AFib, CVA/TIA on warfarin changed to eliquis, HTN.    He comes in today to be seen for Dr. Ladona Ridgel, last seen by him Sept 2011, he is followed as well by L. Short, RN w/ICM clinic.   *** ETOH? *** fluid status *** bleeding/eliquis *** meds  Device information: MDT CRT-D, implanted 08/27/06, Dr. Ladona Ridgel + hx of appropriate shocks for Vt/ VF  Past Medical History:  Diagnosis Date  . Alcohol abuse    PREVIOUS H/O DRINKING A FIFTH OF VODKA  A DAY  . Anginal pain (HCC)   . Arthritis    hands & back  . CHF (congestive heart failure) (HCC)   . Chronic kidney disease    acute renal  04/2012  . Chronic systolic heart failure (HCC)    LEFT  . Coronary artery disease   . History of angina   . Hypertension   . ICD (implantable cardiac defibrillator) in place   . LBBB (left bundle branch block)   . Nonischemic cardiomyopathy (HCC)    EF 20% s/p INSERTION OF BI-V DEFIBRILLATOR.  . Stroke (HCC)    TIA's  . Thyroid disease    HYPOTHYROIDISM.....TREATED  . TIA (transient ischemic attack)     Past Surgical History:  Procedure Laterality Date  . CARDIAC CATHETERIZATION    . CYSTOSCOPY W/ STONE MANIPULATION    . FLEXIBLE SIGMOIDOSCOPY N/A 04/29/2012   Procedure: FLEXIBLE SIGMOIDOSCOPY;  Surgeon: Vertell Novak., MD;  Location: [facility] ENDOSCOPY;  Service: Endoscopy;  Laterality: N/A;  . IMPLANTABLE CARDIOVERTER DEFIBRILLATOR (ICD) GENERATOR CHANGE Bilateral 12/25/2011   Procedure: ICD GENERATOR CHANGE;  Surgeon: Marinus Maw, MD;  Location: [facility] CATH LAB;  Service: Cardiovascular;  Laterality: Bilateral;  . INSERT / REPLACE /  REMOVE PACEMAKER     BI-V IMPLANTABLE CARDIOVERTER-DEFIBRILLATOR. ; MEDTRONIC CONCERTO; MODEL  # [id number], SERIAL # [id number] H.  DR. Humberto Leep. EDMUNDS.  . TONSILLECTOMY    . VASECTOMY      Current Outpatient Prescriptions  Medication Sig Dispense Refill  . allopurinol (ZYLOPRIM) 100 MG tablet Take 100 mg by mouth daily.     Marland Kitchen atorvastatin (LIPITOR) 40 MG tablet Take 40 mg by mouth at bedtime.     . benazepril (LOTENSIN) 10 MG tablet Take 10 mg by mouth daily.     Marland Kitchen bismuth subsalicylate (PEPTO BISMOL) 262 MG/15ML suspension Take 30 mLs by mouth every 6 (six) hours as needed (nausea).    . carvedilol (COREG) 6.25 MG tablet Take 6.25 mg by mouth 2 (two) times daily with a meal.    . ELIQUIS 5 MG TABS tablet TAKE 1 TABLET BY MOUTH TWICE DAILY 60 tablet 5  . furosemide (LASIX) 20 MG tablet Take 20 mg by mouth daily.     Marland Kitchen levothyroxine (SYNTHROID, LEVOTHROID) 50 MCG tablet Take 1 tablet (50 mcg total) by mouth daily before breakfast. 30 tablet 0  . nitroGLYCERIN (NITROSTAT) 0.4 MG SL tablet Place 1 tablet (0.4 mg total) under the tongue every 5 (five) minutes as needed for chest pain. 25 tablet 3  . OVER THE COUNTER MEDICATION Take 1 tablet by mouth daily. Multi-allergy tablet  No current facility-administered medications for this visit.     Allergies:   Sulfa antibiotics   Social History:  The patient  reports that he quit smoking about 4 years ago. He has a 64.50 pack-year smoking history. He has never used smokeless tobacco. He reports that he does not drink alcohol or use drugs.   Family History:  The patient's family history includes Heart disease in his father and mother; Hypertension in his unknown relative.  ROS:  Please see the history of present illness.  All other systems are reviewed and otherwise negative.   PHYSICAL EXAM: *** VS:  There were no vitals taken for this visit. BMI: There is no height or weight on file to calculate BMI. Well nourished, well developed, in no  acute distress  HEENT: normocephalic, atraumatic  Neck: no JVD, carotid bruits or masses Cardiac:  *** RRR; no significant murmurs, no rubs, or gallops Lungs:  *** CTA b/l, no wheezing, rhonchi or rales  Abd: soft, nontender MS: no deformity or *** atrophy Ext: *** no edema  Skin: warm and dry, no rash Neuro:  No gross deficits appreciated Psych: euthymic mood, full affect  *** ICD site is stable, no tethering or discomfort   EKG:  Done today shows *** ICD interrogation done today and reviewed by myself: ***  01/21/16: TTE Study Conclusions - Left ventricle: The cavity size was moderately dilated. Systolic   function was normal. The estimated ejection fraction was in the   range of 50% to 55%. Wall motion was normal; there were no   regional wall motion abnormalities. Doppler parameters are   consistent with abnormal left ventricular relaxation (grade 1   diastolic dysfunction).  Recent Labs: 09/01/2016: ALT 38; B Natriuretic Peptide 67.5; BUN 7; Creatinine, Ser 0.90; Hemoglobin 11.2; Platelets 109; Potassium 3.7; Sodium 131  01/21/2016: Cholesterol 147; HDL 85; LDL Cholesterol 53; Total CHOL/HDL Ratio 1.7; Triglycerides 44; VLDL 9   CrCl cannot be calculated (Patient's most recent lab result is older than the maximum 21 days allowed.).   Wt Readings from Last 3 Encounters:  01/21/16 218 lb 1.6 oz (98.9 kg)  10/29/15 216 lb 3.2 oz (98.1 kg)  10/14/15 222 lb 14.2 oz (101.1 kg)     Other studies reviewed: Additional studies/records reviewed today include: summarized above  ASSESSMENT AND PLAN:  1. CRT-D    ***  2. NICM, chronic CHF    ***  3. HTN     ***  4. Paroxysmal AFib     CHA2DS2Vasc is at least 5, on Eliquis, appropriately dosed   Disposition: F/u with ***  Current medicines are reviewed at length with the patient today.  The patient did not have any concerns regarding medicines.***  Signed, Francis Dowse, PA-C 11/01/2016 2:07 PM     [facility] [address] [place] [address] [phone] (office)  [phone] (fax)

## 2016-11-04 ENCOUNTER — Encounter: Payer: PPO | Admitting: Physician Assistant

## 2016-11-05 ENCOUNTER — Encounter: Payer: Self-pay | Admitting: Physician Assistant

## 2016-11-17 ENCOUNTER — Encounter: Payer: PPO | Admitting: Physician Assistant

## 2016-11-19 NOTE — Progress Notes (Deleted)
Cardiology Office Note Date:  11/19/2016  Patient ID:  Don, [name] [birth year], MRN [id number] PCP:  Tracey Harries, MD  Electrophysiologist:  Dr. Ladona Ridgel  ***refresh   Chief Complaint: annual EP/device visit  History of Present Illness: Don [name] is a 70 y.o. male with history of NICM, chronic CHF (systolic), LBBB w/CRT-D, hx of ETOH, VT, AFib, CVA/TIA on warfarin changed to eliquis, HTN.    He comes in today to be seen for Dr. Ladona Ridgel, last seen by him Sept 2011, he is followed as well by L. Short, RN w/ICM clinic.   *** ETOH? *** fluid status *** bleeding/eliquis *** meds  Device information: MDT CRT-D, implanted 08/27/06, Dr. Ladona Ridgel + hx of appropriate shocks for Vt/ VF  Past Medical History:  Diagnosis Date  . Alcohol abuse    PREVIOUS H/O DRINKING A FIFTH OF VODKA  A DAY  . Anginal pain (HCC)   . Arthritis    hands & back  . CHF (congestive heart failure) (HCC)   . Chronic kidney disease    acute renal  04/2012  . Chronic systolic heart failure (HCC)    LEFT  . Coronary artery disease   . History of angina   . Hypertension   . ICD (implantable cardiac defibrillator) in place   . LBBB (left bundle branch block)   . Nonischemic cardiomyopathy (HCC)    EF 20% s/p INSERTION OF BI-V DEFIBRILLATOR.  . Stroke (HCC)    TIA's  . Thyroid disease    HYPOTHYROIDISM.....TREATED  . TIA (transient ischemic attack)     Past Surgical History:  Procedure Laterality Date  . CARDIAC CATHETERIZATION    . CYSTOSCOPY W/ STONE MANIPULATION    . FLEXIBLE SIGMOIDOSCOPY N/A 04/29/2012   Procedure: FLEXIBLE SIGMOIDOSCOPY;  Surgeon: Vertell Novak., MD;  Location[facility] ENDOSCOPY;  Service: Endoscopy;  Laterality: N/A;  . IMPLANTABLE CARDIOVERTER DEFIBRILLATOR (ICD) GENERATOR CHANGE Bilateral 12/25/2011   Procedure: ICD GENERATOR CHANGE;  Surgeon: Marinus Maw, MD;  Location: [facility] CATH LAB;  Service: Cardiovascular;  Laterality: Bilateral;  . INSERT / REPLACE /  REMOVE PACEMAKER     BI-V IMPLANTABLE CARDIOVERTER-DEFIBRILLATOR. ; MEDTRONIC CONCERTO; MODEL  # [id number], SERIAL # [id number] H.  DR. Humberto Leep. EDMUNDS.  . TONSILLECTOMY    . VASECTOMY      Current Outpatient Prescriptions  Medication Sig Dispense Refill  . allopurinol (ZYLOPRIM) 100 MG tablet Take 100 mg by mouth daily.     Marland Kitchen atorvastatin (LIPITOR) 40 MG tablet Take 40 mg by mouth at bedtime.     . benazepril (LOTENSIN) 10 MG tablet Take 10 mg by mouth daily.     Marland Kitchen bismuth subsalicylate (PEPTO BISMOL) 262 MG/15ML suspension Take 30 mLs by mouth every 6 (six) hours as needed (nausea).    . carvedilol (COREG) 6.25 MG tablet Take 6.25 mg by mouth 2 (two) times daily with a meal.    . ELIQUIS 5 MG TABS tablet TAKE 1 TABLET BY MOUTH TWICE DAILY 60 tablet 5  . furosemide (LASIX) 20 MG tablet Take 20 mg by mouth daily.     Marland Kitchen levothyroxine (SYNTHROID, LEVOTHROID) 50 MCG tablet Take 1 tablet (50 mcg total) by mouth daily before breakfast. 30 tablet 0  . nitroGLYCERIN (NITROSTAT) 0.4 MG SL tablet Place 1 tablet (0.4 mg total) under the tongue every 5 (five) minutes as needed for chest pain. 25 tablet 3  . OVER THE COUNTER MEDICATION Take 1 tablet by mouth daily. Multi-allergy tablet  No current facility-administered medications for this visit.     Allergies:   Sulfa antibiotics   Social History:  The patient  reports that he quit smoking about 4 years ago. He has a 64.50 pack-year smoking history. He has never used smokeless tobacco. He reports that he does not drink alcohol or use drugs.   Family History:  The patient's family history includes Heart disease in his father and mother; Hypertension in his unknown relative.  ROS:  Please see the history of present illness.  All other systems are reviewed and otherwise negative.   PHYSICAL EXAM: *** VS:  There were no vitals taken for this visit. BMI: There is no height or weight on file to calculate BMI. Well nourished, well developed, in no  acute distress  HEENT: normocephalic, atraumatic  Neck: no JVD, carotid bruits or masses Cardiac:  *** RRR; no significant murmurs, no rubs, or gallops Lungs:  *** CTA b/l, no wheezing, rhonchi or rales  Abd: soft, nontender MS: no deformity or *** atrophy Ext: *** no edema  Skin: warm and dry, no rash Neuro:  No gross deficits appreciated Psych: euthymic mood, full affect  *** ICD site is stable, no tethering or discomfort   EKG:  Done today shows *** ICD interrogation done today and reviewed by myself: ***  01/21/16: TTE Study Conclusions - Left ventricle: The cavity size was moderately dilated. Systolic   function was normal. The estimated ejection fraction was in the   range of 50% to 55%. Wall motion was normal; there were no   regional wall motion abnormalities. Doppler parameters are   consistent with abnormal left ventricular relaxation (grade 1   diastolic dysfunction).  Recent Labs: 09/01/2016: ALT 38; B Natriuretic Peptide 67.5; BUN 7; Creatinine, Ser 0.90; Hemoglobin 11.2; Platelets 109; Potassium 3.7; Sodium 131  01/21/2016: Cholesterol 147; HDL 85; LDL Cholesterol 53; Total CHOL/HDL Ratio 1.7; Triglycerides 44; VLDL 9   CrCl cannot be calculated (Patient's most recent lab result is older than the maximum 21 days allowed.).   Wt Readings from Last 3 Encounters:  01/21/16 218 lb 1.6 oz (98.9 kg)  10/29/15 216 lb 3.2 oz (98.1 kg)  10/14/15 222 lb 14.2 oz (101.1 kg)     Other studies reviewed: Additional studies/records reviewed today include: summarized above  ASSESSMENT AND PLAN:  1. CRT-D    ***  2. NICM, chronic CHF    ***  3. HTN     ***  4. Paroxysmal AFib     CHA2DS2Vasc is at least 5, on Eliquis, appropriately dosed   Disposition: F/u with ***  Current medicines are reviewed at length with the patient today.  The patient did not have any concerns regarding medicines.***  Norma Fredrickson, PA-C 11/19/2016 6:19 AM     [facility] [address] [place] [address] [phone] (office)  [phone] (fax)

## 2016-11-20 ENCOUNTER — Encounter: Payer: PPO | Admitting: Physician Assistant

## 2016-11-21 ENCOUNTER — Encounter: Payer: Self-pay | Admitting: Physician Assistant

## 2016-11-25 ENCOUNTER — Telehealth: Payer: Self-pay | Admitting: Cardiology

## 2016-11-25 NOTE — Telephone Encounter (Signed)
LMOVM reminding pt to send remote transmission.   

## 2016-12-11 NOTE — Progress Notes (Signed)
No ICM remote transmission received for 11/25/2016 and next ICM transmission scheduled for 01/01/2017.

## 2016-12-25 ENCOUNTER — Other Ambulatory Visit: Payer: Self-pay

## 2016-12-25 ENCOUNTER — Inpatient Hospital Stay (HOSPITAL_COMMUNITY): Payer: PPO

## 2016-12-25 ENCOUNTER — Inpatient Hospital Stay (HOSPITAL_COMMUNITY)
Admission: EM | Admit: 2016-12-25 | Discharge: 2016-12-27 | DRG: 378 | Disposition: A | Discharge status: Home / Self Care | Payer: PPO | Attending: Family Medicine | Admitting: Family Medicine

## 2016-12-25 ENCOUNTER — Encounter (HOSPITAL_COMMUNITY): Payer: Self-pay

## 2016-12-25 DIAGNOSIS — Z8249 Family history of ischemic heart disease and other diseases of the circulatory system: Secondary | ICD-10-CM | POA: Diagnosis not present

## 2016-12-25 DIAGNOSIS — M19042 Primary osteoarthritis, left hand: Secondary | ICD-10-CM | POA: Diagnosis present

## 2016-12-25 DIAGNOSIS — E785 Hyperlipidemia, unspecified: Secondary | ICD-10-CM | POA: Diagnosis present

## 2016-12-25 DIAGNOSIS — D62 Acute posthemorrhagic anemia: Secondary | ICD-10-CM | POA: Diagnosis not present

## 2016-12-25 DIAGNOSIS — K922 Gastrointestinal hemorrhage, unspecified: Secondary | ICD-10-CM | POA: Diagnosis present

## 2016-12-25 DIAGNOSIS — D696 Thrombocytopenia, unspecified: Secondary | ICD-10-CM | POA: Diagnosis present

## 2016-12-25 DIAGNOSIS — E890 Postprocedural hypothyroidism: Secondary | ICD-10-CM | POA: Diagnosis present

## 2016-12-25 DIAGNOSIS — K625 Hemorrhage of anus and rectum: Secondary | ICD-10-CM | POA: Diagnosis not present

## 2016-12-25 DIAGNOSIS — E876 Hypokalemia: Secondary | ICD-10-CM | POA: Diagnosis not present

## 2016-12-25 DIAGNOSIS — E871 Hypo-osmolality and hyponatremia: Secondary | ICD-10-CM | POA: Diagnosis not present

## 2016-12-25 DIAGNOSIS — I10 Essential (primary) hypertension: Secondary | ICD-10-CM | POA: Diagnosis present

## 2016-12-25 DIAGNOSIS — Z23 Encounter for immunization: Secondary | ICD-10-CM | POA: Diagnosis not present

## 2016-12-25 DIAGNOSIS — R791 Abnormal coagulation profile: Secondary | ICD-10-CM

## 2016-12-25 DIAGNOSIS — M479 Spondylosis, unspecified: Secondary | ICD-10-CM | POA: Diagnosis present

## 2016-12-25 DIAGNOSIS — Z882 Allergy status to sulfonamides status: Secondary | ICD-10-CM

## 2016-12-25 DIAGNOSIS — I130 Hypertensive heart and chronic kidney disease with heart failure and stage 1 through stage 4 chronic kidney disease, or unspecified chronic kidney disease: Secondary | ICD-10-CM | POA: Diagnosis present

## 2016-12-25 DIAGNOSIS — F17210 Nicotine dependence, cigarettes, uncomplicated: Secondary | ICD-10-CM | POA: Diagnosis present

## 2016-12-25 DIAGNOSIS — Z79890 Hormone replacement therapy (postmenopausal): Secondary | ICD-10-CM | POA: Diagnosis not present

## 2016-12-25 DIAGNOSIS — K644 Residual hemorrhoidal skin tags: Secondary | ICD-10-CM | POA: Diagnosis present

## 2016-12-25 DIAGNOSIS — N179 Acute kidney failure, unspecified: Secondary | ICD-10-CM | POA: Diagnosis not present

## 2016-12-25 DIAGNOSIS — I482 Chronic atrial fibrillation, unspecified: Secondary | ICD-10-CM

## 2016-12-25 DIAGNOSIS — N189 Chronic kidney disease, unspecified: Secondary | ICD-10-CM | POA: Diagnosis not present

## 2016-12-25 DIAGNOSIS — D649 Anemia, unspecified: Secondary | ICD-10-CM | POA: Diagnosis not present

## 2016-12-25 DIAGNOSIS — I4891 Unspecified atrial fibrillation: Secondary | ICD-10-CM | POA: Diagnosis not present

## 2016-12-25 DIAGNOSIS — F1010 Alcohol abuse, uncomplicated: Secondary | ICD-10-CM | POA: Diagnosis present

## 2016-12-25 DIAGNOSIS — I447 Left bundle-branch block, unspecified: Secondary | ICD-10-CM | POA: Diagnosis present

## 2016-12-25 DIAGNOSIS — R197 Diarrhea, unspecified: Secondary | ICD-10-CM | POA: Diagnosis not present

## 2016-12-25 DIAGNOSIS — Z95810 Presence of automatic (implantable) cardiac defibrillator: Secondary | ICD-10-CM | POA: Diagnosis not present

## 2016-12-25 DIAGNOSIS — I428 Other cardiomyopathies: Secondary | ICD-10-CM | POA: Diagnosis not present

## 2016-12-25 DIAGNOSIS — R110 Nausea: Secondary | ICD-10-CM | POA: Diagnosis not present

## 2016-12-25 DIAGNOSIS — Z720 Tobacco use: Secondary | ICD-10-CM | POA: Diagnosis present

## 2016-12-25 DIAGNOSIS — I5022 Chronic systolic (congestive) heart failure: Secondary | ICD-10-CM | POA: Diagnosis not present

## 2016-12-25 DIAGNOSIS — R740 Nonspecific elevation of levels of transaminase and lactic acid dehydrogenase [LDH]: Secondary | ICD-10-CM

## 2016-12-25 DIAGNOSIS — K921 Melena: Secondary | ICD-10-CM | POA: Diagnosis not present

## 2016-12-25 DIAGNOSIS — Z8673 Personal history of transient ischemic attack (TIA), and cerebral infarction without residual deficits: Secondary | ICD-10-CM

## 2016-12-25 DIAGNOSIS — Z7901 Long term (current) use of anticoagulants: Secondary | ICD-10-CM

## 2016-12-25 DIAGNOSIS — I2510 Atherosclerotic heart disease of native coronary artery without angina pectoris: Secondary | ICD-10-CM | POA: Diagnosis present

## 2016-12-25 DIAGNOSIS — R531 Weakness: Secondary | ICD-10-CM | POA: Diagnosis not present

## 2016-12-25 DIAGNOSIS — R7401 Elevation of levels of liver transaminase levels: Secondary | ICD-10-CM

## 2016-12-25 DIAGNOSIS — M19041 Primary osteoarthritis, right hand: Secondary | ICD-10-CM | POA: Diagnosis present

## 2016-12-25 DIAGNOSIS — K649 Unspecified hemorrhoids: Secondary | ICD-10-CM | POA: Diagnosis not present

## 2016-12-25 LAB — CBC WITH DIFFERENTIAL/PLATELET
BASOS ABS: 0 10*3/uL (ref 0.0–0.1)
BASOS PCT: 0 %
EOS PCT: 1 %
Eosinophils Absolute: 0.1 10*3/uL (ref 0.0–0.7)
HEMATOCRIT: 31.2 % — AB (ref 39.0–52.0)
Hemoglobin: 10.7 g/dL — ABNORMAL LOW (ref 13.0–17.0)
Lymphocytes Relative: 24 %
Lymphs Abs: 1.3 10*3/uL (ref 0.7–4.0)
MCH: 32.4 pg (ref 26.0–34.0)
MCHC: 34.3 g/dL (ref 30.0–36.0)
MCV: 94.5 fL (ref 78.0–100.0)
MONO ABS: 0.6 10*3/uL (ref 0.1–1.0)
MONOS PCT: 11 %
NEUTROS ABS: 3.5 10*3/uL (ref 1.7–7.7)
Neutrophils Relative %: 63 %
PLATELETS: 85 10*3/uL — AB (ref 150–400)
RBC: 3.3 MIL/uL — ABNORMAL LOW (ref 4.22–5.81)
RDW: 14.3 % (ref 11.5–15.5)
WBC: 5.5 10*3/uL (ref 4.0–10.5)

## 2016-12-25 LAB — HEMOGLOBIN AND HEMATOCRIT, BLOOD
HCT: 25.5 % — ABNORMAL LOW (ref 39.0–52.0)
HEMATOCRIT: 24 % — AB (ref 39.0–52.0)
HEMATOCRIT: 24.6 % — AB (ref 39.0–52.0)
HEMOGLOBIN: 8.5 g/dL — AB (ref 13.0–17.0)
Hemoglobin: 8.9 g/dL — ABNORMAL LOW (ref 13.0–17.0)
Hemoglobin: 8.2 g/dL — ABNORMAL LOW (ref 13.0–17.0)

## 2016-12-25 LAB — TYPE AND SCREEN
ABO/RH(D): B POS
Antibody Screen: NEGATIVE

## 2016-12-25 LAB — PROTIME-INR
INR: 1.54
PROTHROMBIN TIME: 18.3 s — AB (ref 11.4–15.2)

## 2016-12-25 LAB — COMPREHENSIVE METABOLIC PANEL
ALBUMIN: 3 g/dL — AB (ref 3.5–5.0)
ALK PHOS: 72 U/L (ref 38–126)
ALT: 29 U/L (ref 17–63)
ANION GAP: 11 (ref 5–15)
AST: 70 U/L — ABNORMAL HIGH (ref 15–41)
BILIRUBIN TOTAL: 0.9 mg/dL (ref 0.3–1.2)
BUN: 7 mg/dL (ref 6–20)
CALCIUM: 8.3 mg/dL — AB (ref 8.9–10.3)
CO2: 29 mmol/L (ref 22–32)
Chloride: 91 mmol/L — ABNORMAL LOW (ref 101–111)
Creatinine, Ser: 1.42 mg/dL — ABNORMAL HIGH (ref 0.61–1.24)
GFR calc Af Amer: 56 mL/min — ABNORMAL LOW (ref >60)
GFR calc non Af Amer: 49 mL/min — ABNORMAL LOW (ref >60)
GLUCOSE: 96 mg/dL (ref 65–99)
Potassium: 3.2 mmol/L — ABNORMAL LOW (ref 3.5–5.1)
SODIUM: 131 mmol/L — AB (ref 135–145)
TOTAL PROTEIN: 6 g/dL — AB (ref 6.5–8.1)

## 2016-12-25 LAB — I-STAT TROPONIN, ED: Troponin i, poc: 0.01 ng/mL (ref 0.00–0.08)

## 2016-12-25 LAB — APTT: aPTT: 31 seconds (ref 24–36)

## 2016-12-25 LAB — POC OCCULT BLOOD, ED: Fecal Occult Bld: POSITIVE — AB

## 2016-12-25 LAB — I-STAT CG4 LACTIC ACID, ED
LACTIC ACID, VENOUS: 1.61 mmol/L (ref 0.5–1.9)
Lactic Acid, Venous: 0.77 mmol/L (ref 0.5–1.9)

## 2016-12-25 MED ORDER — LEVOTHYROXINE SODIUM 50 MCG PO TABS
50.0000 ug | ORAL_TABLET | Freq: Every day | ORAL | Status: DC
  Administered 2016-12-25 – 2016-12-27 (×3): 50 ug via ORAL
  Filled 2016-12-25 (×3): qty 1

## 2016-12-25 MED ORDER — VITAMIN B-1 100 MG PO TABS
100.0000 mg | ORAL_TABLET | Freq: Every day | ORAL | Status: DC
  Administered 2016-12-25 – 2016-12-27 (×3): 100 mg via ORAL
  Filled 2016-12-25 (×4): qty 1

## 2016-12-25 MED ORDER — LORAZEPAM 1 MG PO TABS: 1.0000 mg | ORAL_TABLET | Freq: Four times a day (QID) | ORAL | Status: DC | PRN

## 2016-12-25 MED ORDER — ALBUTEROL SULFATE (2.5 MG/3ML) 0.083% IN NEBU: 2.5000 mg | INHALATION_SOLUTION | RESPIRATORY_TRACT | Status: DC | PRN

## 2016-12-25 MED ORDER — CARVEDILOL 12.5 MG PO TABS
12.5000 mg | ORAL_TABLET | Freq: Two times a day (BID) | ORAL | Status: DC
  Administered 2016-12-25 – 2016-12-26 (×3): 12.5 mg via ORAL
  Filled 2016-12-25 (×3): qty 1

## 2016-12-25 MED ORDER — ACETAMINOPHEN 650 MG RE SUPP
650.0000 mg | Freq: Four times a day (QID) | RECTAL | Status: DC | PRN
Start: 2016-12-25 — End: 2016-12-27

## 2016-12-25 MED ORDER — ALLOPURINOL 100 MG PO TABS
100.0000 mg | ORAL_TABLET | Freq: Every day | ORAL | Status: DC
  Administered 2016-12-25 – 2016-12-27 (×3): 100 mg via ORAL
  Filled 2016-12-25 (×3): qty 1

## 2016-12-25 MED ORDER — ONDANSETRON HCL 4 MG/2ML IJ SOLN: 4.0000 mg | Freq: Four times a day (QID) | INTRAMUSCULAR | Status: DC | PRN

## 2016-12-25 MED ORDER — FOLIC ACID 1 MG PO TABS
1.0000 mg | ORAL_TABLET | Freq: Every day | ORAL | Status: DC
  Administered 2016-12-25 – 2016-12-27 (×3): 1 mg via ORAL
  Filled 2016-12-25 (×3): qty 1

## 2016-12-25 MED ORDER — ATORVASTATIN CALCIUM 40 MG PO TABS
40.0000 mg | ORAL_TABLET | Freq: Every day | ORAL | Status: DC
  Administered 2016-12-25: 40 mg via ORAL
  Filled 2016-12-25 (×2): qty 1

## 2016-12-25 MED ORDER — TRAZODONE HCL 50 MG PO TABS: 50.0000 mg | ORAL_TABLET | Freq: Every evening | ORAL | Status: DC | PRN

## 2016-12-25 MED ORDER — SODIUM CHLORIDE 0.9% FLUSH
3.0000 mL | Freq: Two times a day (BID) | INTRAVENOUS | Status: DC
  Administered 2016-12-25 – 2016-12-26 (×4): 3 mL via INTRAVENOUS

## 2016-12-25 MED ORDER — NITROGLYCERIN 0.4 MG SL SUBL: 0.4000 mg | SUBLINGUAL_TABLET | SUBLINGUAL | Status: DC | PRN

## 2016-12-25 MED ORDER — HYDROCORTISONE 2.5 % RE CREA
1.0000 "application " | TOPICAL_CREAM | Freq: Three times a day (TID) | RECTAL | Status: DC
  Administered 2016-12-25 (×2): 1 via RECTAL
  Filled 2016-12-25: qty 28.35

## 2016-12-25 MED ORDER — ACETAMINOPHEN 325 MG PO TABS: 650.0000 mg | ORAL_TABLET | Freq: Four times a day (QID) | ORAL | Status: DC | PRN

## 2016-12-25 MED ORDER — SENNA 8.6 MG PO TABS
1.0000 | ORAL_TABLET | Freq: Two times a day (BID) | ORAL | Status: DC
  Administered 2016-12-25 – 2016-12-27 (×3): 8.6 mg via ORAL
  Filled 2016-12-25 (×4): qty 1

## 2016-12-25 MED ORDER — SODIUM CHLORIDE 0.9 % IV BOLUS (SEPSIS)
1000.0000 mL | Freq: Once | INTRAVENOUS | Status: AC
  Administered 2016-12-25: 1000 mL via INTRAVENOUS

## 2016-12-25 MED ORDER — LORAZEPAM 2 MG/ML IJ SOLN: 0.0000 mg | Freq: Two times a day (BID) | INTRAMUSCULAR | Status: DC

## 2016-12-25 MED ORDER — LORAZEPAM 2 MG/ML IJ SOLN
0.0000 mg | Freq: Four times a day (QID) | INTRAMUSCULAR | Status: DC
Start: 2016-12-25 — End: 2016-12-27
  Filled 2016-12-25: qty 1

## 2016-12-25 MED ORDER — SODIUM CHLORIDE 0.9 % IV SOLN
INTRAVENOUS | Status: DC
  Administered 2016-12-25: 11:00:00 via INTRAVENOUS
  Administered 2016-12-26: 75 mL via INTRAVENOUS

## 2016-12-25 MED ORDER — INFLUENZA VAC SPLIT HIGH-DOSE 0.5 ML IM SUSY
0.5000 mL | PREFILLED_SYRINGE | INTRAMUSCULAR | Status: AC
  Administered 2016-12-26: 0.5 mL via INTRAMUSCULAR
  Filled 2016-12-25: qty 0.5

## 2016-12-25 MED ORDER — PANTOPRAZOLE SODIUM 40 MG IV SOLR
40.0000 mg | Freq: Two times a day (BID) | INTRAVENOUS | Status: DC
  Administered 2016-12-25 – 2016-12-27 (×5): 40 mg via INTRAVENOUS
  Filled 2016-12-25 (×5): qty 40

## 2016-12-25 MED ORDER — SODIUM CHLORIDE 0.9% FLUSH
3.0000 mL | INTRAVENOUS | Status: DC | PRN
Start: 2016-12-25 — End: 2016-12-27

## 2016-12-25 MED ORDER — ONDANSETRON HCL 4 MG PO TABS
4.0000 mg | ORAL_TABLET | Freq: Four times a day (QID) | ORAL | Status: DC | PRN
  Administered 2016-12-25: 4 mg via ORAL
  Filled 2016-12-25: qty 1

## 2016-12-25 MED ORDER — LORAZEPAM 2 MG/ML IJ SOLN: 1.0000 mg | Freq: Four times a day (QID) | INTRAMUSCULAR | Status: DC | PRN

## 2016-12-25 MED ORDER — BISMUTH SUBSALICYLATE 262 MG/15ML PO SUSP
30.0000 mL | Freq: Four times a day (QID) | ORAL | Status: DC | PRN
  Filled 2016-12-25: qty 118

## 2016-12-25 MED ORDER — SODIUM CHLORIDE 0.9 % IV SOLN
INTRAVENOUS | Status: DC
  Administered 2016-12-25: 07:00:00 via INTRAVENOUS

## 2016-12-25 MED ORDER — POTASSIUM CHLORIDE 10 MEQ/100ML IV SOLN
10.0000 meq | INTRAVENOUS | Status: AC
  Administered 2016-12-25 (×2): 10 meq via INTRAVENOUS
  Filled 2016-12-25 (×2): qty 100

## 2016-12-25 MED ORDER — ADULT MULTIVITAMIN W/MINERALS CH
1.0000 | ORAL_TABLET | Freq: Every day | ORAL | Status: DC
  Administered 2016-12-26 – 2016-12-27 (×2): 1 via ORAL
  Filled 2016-12-25 (×2): qty 1

## 2016-12-25 MED ORDER — LORAZEPAM 2 MG/ML IJ SOLN
1.0000 mg | INTRAMUSCULAR | Status: DC | PRN
  Administered 2016-12-25: 1 mg via INTRAVENOUS

## 2016-12-25 MED ORDER — POLYETHYLENE GLYCOL 3350 17 G PO PACK: 17.0000 g | PACK | Freq: Every day | ORAL | Status: DC | PRN

## 2016-12-25 MED ORDER — SODIUM CHLORIDE 0.9 % IV SOLN: 250.0000 mL | INTRAVENOUS | Status: DC | PRN

## 2016-12-25 NOTE — ED Notes (Signed)
Paged IV team 

## 2016-12-25 NOTE — ED Notes (Signed)
IV team at bedside 

## 2016-12-25 NOTE — ED Provider Notes (Signed)
[facility] EMERGENCY DEPARTMENT Provider Note   CSN: [id number] Arrival date & time: 12/25/16  0136     History   Chief Complaint Chief Complaint  Patient presents with  . GI Bleeding    HPI Don [name] is a 71 y.o. male.  The history is provided by the patient.  He came to the hospital because of rectal bleeding.  At about 11:30 PM, he went to the bathroom to move his bowels.  As he went to wipe, he started pouring bright red blood from his rectum.  He states that he has not been eating well for months and he has been generally weak for months.  He denies any abdominal pain or nausea or vomiting.  Of note, he is anticoagulated on warfarin.  He takes it for atrial fibrillation.  He is switched to warfarin from apixaban about 1 month ago, but has not had any of the blood work to monitor his warfarin.  He has no prior history of GI bleed.  He does have history of alcohol abuse and he admits to drinking about a half bottle of wine a day, but is very vague about that.  Other significant past history includes chronic systolic heart failure, implanted defibrillator, chronic kidney disease, coronary artery disease.  He came in by ambulance, and EMS noted blood pressure dropped from 120/100 supine to 70/40 standing.  Past Medical History:  Diagnosis Date  . Alcohol abuse    PREVIOUS H/O DRINKING A FIFTH OF VODKA  A DAY  . Anginal pain (HCC)   . Arthritis    hands & back  . CHF (congestive heart failure) (HCC)   . Chronic kidney disease    acute renal  04/2012  . Chronic systolic heart failure (HCC)    LEFT  . Coronary artery disease   . History of angina   . Hypertension   . ICD (implantable cardiac defibrillator) in place   . LBBB (left bundle branch block)   . Nonischemic cardiomyopathy (HCC)    EF 20% s/p INSERTION OF BI-V DEFIBRILLATOR.  . Stroke (HCC)    TIA's  . Thyroid disease    HYPOTHYROIDISM.....TREATED  . TIA (transient ischemic attack)      Patient Active Problem List   Diagnosis Date Noted  . Encounter for therapeutic drug monitoring 11/19/2015  . Atrial fibrillation (HCC) [I48.91] 11/19/2015  . Thrombocytopenia (HCC) 10/15/2015  . Sepsis (HCC) 10/12/2015  . Pain in scapula 10/12/2015  . Collagenous colitis 10/12/2015  . Dizziness and giddiness   . History of stroke 09/18/2015  . HLD (hyperlipidemia) 09/18/2015  . Hypothyroid 09/18/2015  . Essential hypertension 09/18/2015  . Tobacco use 09/18/2015  . 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 Hypocalcemia 02/13/2014  . Lymphadenopathy 09/28/2013  . Transient cerebral ischemia 08/16/2012  . Hemorrhoids with complication 04/29/2012  . Acute renal failure (HCC) 04/26/2012  . Hypovolemic shock (HCC) 04/26/2012  . Rectal bleeding 04/26/2012  . Diarrhea 04/26/2012  . Hypokalemia 04/26/2012  . Right rib fracture 04/26/2012  . Fall 04/26/2012  . Dehydration 04/26/2012  . ETOH abuse   . Acute ischemic stroke (HCC)   . 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   . Biventricular implantable cardioverter-defibrillator in situ 07/10/2010  . Chronic combined systolic (congestive) and diastolic (congestive) heart failure (HCC) 07/10/2010  . Ventricular tachycardia (HCC) 07/10/2010    Past Surgical History:  Procedure Laterality Date  . CARDIAC CATHETERIZATION    . CYSTOSCOPY W/ STONE MANIPULATION    .  INSERT / REPLACE / REMOVE PACEMAKER     BI-V IMPLANTABLE CARDIOVERTER-DEFIBRILLATOR. ; MEDTRONIC CONCERTO; MODEL  # [id number], SERIAL # [id number] H.  DR. Humberto Leep. EDMUNDS.  . TONSILLECTOMY    . VASECTOMY         Home Medications    Prior to Admission medications   Medication Sig Start Date End Date Taking? Authorizing Provider  allopurinol (ZYLOPRIM) 100 MG tablet Take 100 mg by mouth daily.     [provider]  atorvastatin (LIPITOR) 40 MG tablet Take 40 mg by mouth at bedtime.  09/03/11   [provider]  benazepril (LOTENSIN) 10 MG  tablet Take 10 mg by mouth daily.  07/30/15   [provider]  bismuth subsalicylate (PEPTO BISMOL) 262 MG/15ML suspension Take 30 mLs by mouth every 6 (six) hours as needed (nausea).    [provider]  carvedilol (COREG) 6.25 MG tablet Take 6.25 mg by mouth 2 (two) times daily with a meal.    [provider]  ELIQUIS 5 MG TABS tablet TAKE 1 TABLET BY MOUTH TWICE DAILY 09/30/16   Marinus Maw, MD  furosemide (LASIX) 20 MG tablet Take 20 mg by mouth daily.  10/25/15   [provider]  levothyroxine (SYNTHROID, LEVOTHROID) 50 MCG tablet Take 1 tablet (50 mcg total) by mouth daily before breakfast. 10/15/15   Renae Fickle, MD  nitroGLYCERIN (NITROSTAT) 0.4 MG SL tablet Place 1 tablet (0.4 mg total) under the tongue every 5 (five) minutes as needed for chest pain. 11/19/15   Marinus Maw, MD  OVER THE COUNTER MEDICATION Take 1 tablet by mouth daily. Multi-allergy tablet     [provider]    Family History Family History  Problem Relation Age of Onset  . Heart disease Mother   . Heart disease Father   . Hypertension Unknown     Social History Social History   Tobacco Use  . Smoking status: Current Every Day Smoker    Packs/day: 1.00    Years: 43.00    Pack years: 43.00    Last attempt to quit: 02/27/2012    Years since quitting: 4.8  . Smokeless tobacco: Never Used  Substance Use Topics  . Alcohol use: No    Alcohol/week: 0.0 oz    Comment: H/O ALCOHOLISM PERVIOUSLY A FIFTH OF VODKA A DAY  . Drug use: No     Allergies   Sulfa antibiotics   Review of Systems Review of Systems  All other systems reviewed and are negative.    Physical Exam Updated Vital Signs BP (!) 122/93   Pulse 79   Resp 17   Ht 6\' 1"  (1.854 m)   Wt 102.1 kg (225 lb)   SpO2 100%   BMI 29.69 kg/m   Physical Exam  Nursing note and vitals reviewed.  71 year old male, resting comfortably and in no acute distress. Vital signs are normal. Oxygen  saturation is 100%, which is normal. Head is normocephalic and atraumatic. PERRLA, EOMI. Oropharynx is clear. Neck is nontender and supple without adenopathy or JVD. Back is nontender and there is no CVA tenderness. Lungs are clear without rales, wheezes, or rhonchi. Chest is nontender. Heart has regular rate and rhythm without murmur. Abdomen is soft, flat, nontender without masses or hepatosplenomegaly and peristalsis is normoactive. Extremities have no cyanosis or edema, full range of motion is present. Skin is warm and dry without rash. Neurologic: Mental status is normal, cranial nerves are intact, there are no motor  or sensory deficits.  ED Treatments / Results  Labs (all labs ordered are listed, but only abnormal results are displayed) Labs Reviewed  COMPREHENSIVE METABOLIC PANEL - Abnormal; Notable for the following components:      Result Value   Sodium 131 (*)    Potassium 3.2 (*)    Chloride 91 (*)    Creatinine, Ser 1.42 (*)    Calcium 8.3 (*)    Total Protein 6.0 (*)    Albumin 3.0 (*)    AST 70 (*)    GFR calc non Af Amer 49 (*)    GFR calc Af Amer 56 (*)    All other components within normal limits  CBC WITH DIFFERENTIAL/PLATELET - Abnormal; Notable for the following components:   RBC 3.30 (*)    Hemoglobin 10.7 (*)    HCT 31.2 (*)    Platelets 85 (*)    All other components within normal limits  PROTIME-INR - Abnormal; Notable for the following components:   Prothrombin Time 18.3 (*)    All other components within normal limits  POC OCCULT BLOOD, ED - Abnormal; Notable for the following components:   Fecal Occult Bld POSITIVE (*)    All other components within normal limits  APTT  I-STAT CG4 LACTIC ACID, ED  I-STAT TROPONIN, ED  TYPE AND SCREEN    EKG  EKG Interpretation  Date/Time:  Thursday December 25 2016 03:19:10 EST Ventricular Rate:  84 PR Interval:    QRS Duration: 88 QT Interval:  386 QTC Calculation: 429 R Axis:   -76 Text  Interpretation:  Atrial fibrillation Paired ventricular premature complexes Abnormal R-wave progression, early transition Inferior infarct, old Abnormal lateral Q waves Low voltage QRS When compared with ECG of 09/01/2016, No significant change was found Confirmed by Dione Booze ([id number]) on 12/25/2016 3:25:56 AM       Radiology No results found.  Procedures Procedures (including critical care time) CRITICAL CARE Performed by: UEAVW,[email or web address] Total critical care time: 45 minutes Critical care time was exclusive of separately billable procedures and treating other patients. Critical care was necessary to treat or prevent imminent or life-threatening deterioration. Critical care was time spent personally by me on the following activities: development of treatment plan with patient and/or surrogate as well as nursing, discussions with consultants, evaluation of patient's response to treatment, examination of patient, obtaining history from patient or surrogate, ordering and performing treatments and interventions, ordering and review of laboratory studies, ordering and review of radiographic studies, pulse oximetry and re-evaluation of patient's condition.  Medications Ordered in ED Medications - No data to display   Initial Impression / Assessment and Plan / ED Course  I have reviewed the triage vital signs and the nursing notes.  Pertinent labs & imaging results that were available during my care of the patient were reviewed by me and considered in my medical decision making (see chart for details).  Rectal bleeding and patient who is anticoagulated.  Vital signs are stable at rest, he is started on IV fluids.  Screening labs obtained and blood is obtained for type and screen.  Need to find out his INR to see if he needs to be started on rapid correction of anticoagulation protocol.  Old records are reviewed confirming anticoagulation for atrial fibrillation.  Also history of ethanol abuse and  generalized weakness.  Initial blood pressure was borderline low, has gone up with IV fluids.  Laboratory workup is significant for little change in hemoglobin from baseline.  Evidence of  mild acute kidney injury is present.  Mild hyponatremia and hypokalemia present.  Thrombocytopenia present-presumably from ethanol abuse.  Elevated AST, also from ethanol abuse.  A pre-existing anemia is presumably related to chronic disease.  He has had no further active bleeding while in the ED.  Most likely source of bleeding is diverticulosis.  Case is discussed with Dr. Katrinka BlazingSmith of [facility], who agrees to admit the patient.  CHA2DS2/VAS Stroke Risk Points      5 >= 2 Points: High Risk  1 - 1.99 Points: Medium Risk  0 Points: Low Risk    The patient's score has not changed in the past year.:  No Change     Details    This score determines the patient's risk of having a stroke if the  patient has atrial fibrillation.       Points Metrics  1 Has Congestive Heart Failure:  Yes   0 Has Vascular Disease:  No   1 Has Hypertension:  Yes   1 Age:  [age]   0 Has Diabetes:  No   2 Had Stroke:  Yes  Had TIA:  Yes  Had thromboembolism:  No   0 Male:  No             Final Clinical Impressions(s) / ED Diagnoses   Final diagnoses:  Lower GI bleed  Chronic atrial fibrillation (HCC)  Acute kidney injury (nontraumatic) (HCC)  Thrombocytopenia (HCC)  Subtherapeutic international normalized ratio (INR)  Normochromic normocytic anemia  Hyponatremia  Elevated AST (SGOT)    ED Discharge Orders    None       Dione BoozeGlick, Rosselyn Martha, MD 12/25/16 [phone]

## 2016-12-25 NOTE — H&P (Signed)
Patient Demographics:    Don [name], is a 71 y.o. male  MRN: [id number]   DOB - 05/16/1945  Admit Date - 12/25/2016  Outpatient Primary MD for the patient is Smothers, Cathleen Cortieborah N, NP   Assessment & Plan:    Principal Problem:   GI bleed Active Problems:   ETOH abuse   Rectal bleeding   Essential hypertension   Tobacco use   Atrial fibrillation (HCC) [I48.91]     1)Gi Bleed-patient presented with painless bright red blood per rectum , patient with prior history of hemorrhoidal bleeding 2014, rectal exam this time also shows large external thrombosed tender hemorrhoid, repeat hemoglobin down to 8.9 from 10.8, INR is 1.5, platelet count 85 k,. Colonic biopsies from 2014 previously showed collagenous colitis at that time, prior colonoscopy in 2011 showed hemorrhoids and diverticulosis.  GI consult from Dr Dulce Sellarutlaw requested, continue to follow serial H&H and transfuse as clinically indicated.  Colonoscopy versus RBC tagged scan if bleeding persist.  Topical treatment for hemorrhoids  2)Etoh Abuse-lorazepam CIWA protocol, thiamine and folic acid as ordered  3)NICM-  EF is now over 50% after AICD placement,   4)Afib - on coumadin , INR is currently 1.5,  hold Coumadin for now  5)Aki- POA, Creatinine is 1.4, suspect due to prerenal azotemia in the setting of acute GI bleed, avoid nephrotoxic agents, hydrate, consider nephrology consult and /or imaging if renal function does not improve as anticipated with hydration  6)Acute on Chronic Anemia- due to acute Gi bleed, please see #1 above, chronic anemia which is gotten worse due to acute blood loss anemia superimposed on his baseline chronic anemia, management as above #1  With History of - Reviewed by me  Past Medical History:  Diagnosis Date  . Alcohol abuse     PREVIOUS H/O DRINKING A FIFTH OF VODKA  A DAY  . Anginal pain (HCC)   . Arthritis    hands & back  . CHF (congestive heart failure) (HCC)   . Chronic kidney disease    acute renal  04/2012  . Chronic systolic heart failure (HCC)    LEFT  . Coronary artery disease   . History of angina   . Hypertension   . ICD (implantable cardiac defibrillator) in place   . LBBB (left bundle branch block)   . Nonischemic cardiomyopathy (HCC)    EF 20% s/p INSERTION OF BI-V DEFIBRILLATOR.  . Stroke (HCC)    TIA's  . Thyroid disease    HYPOTHYROIDISM.....TREATED  . TIA (transient ischemic attack)       Past Surgical History:  Procedure Laterality Date  . CARDIAC CATHETERIZATION    . CYSTOSCOPY W/ STONE MANIPULATION    . INSERT / REPLACE / REMOVE PACEMAKER     BI-V IMPLANTABLE CARDIOVERTER-DEFIBRILLATOR. ; [employer]; MODEL  # [id number], SERIAL # [id number] H.  DR. Humberto LeepJOHN H. EDMUNDS.  . TONSILLECTOMY    . VASECTOMY  Chief Complaint  Patient presents with  . GI Bleeding      HPI:    Don [name]  is a 71 y.o. male with past medical history relevant for A.Fib on Coumadin,  HTN, NICM and CHF with EF of 20% that improved to 50-55% post bi-ventricular pacer placement; who presents with complaints of bright red blood per rectum that started around 2330 pm on 12/24/16.    Patient reports no significant abdominal pain, he has had multiple episodes of bright red blood per rectum without corresponding abdominal discomfort, EMS noted systolic blood pressure dropped to 70s with standing, vital signs of the last stable here.    Patient is on Coumadin (switched from Eliquis to Coumadin in September 2018 due to cost),   Patient given 1 L of normal saline IV fluids and TRH called to admit.  Colonic exam and biopsies  from 2014 previously showed hemorrhoids and collagenous colitis at that time, prior colonoscopy in 2011 showed hemorrhoids and diverticulosis.  Patient admits to daily alcohol  use, denies recent NSAID use, no significant abdominal pain,  no vomiting no diarrhea.  No fevers no chills, no flank pain or dysuria   BP improved with IV fluids, repeat hemoglobin down to 8.9 from 10.8, INR is 1.5, platelet count 85 k,  In the ED patient is found to have  large thrombosed tender hemorrhoid    GI consult requested for GI bleed with drop in H&H    Review of systems:    In addition to the HPI above,   A full 12 point Review of 10 Systems was done, except as stated above, all other Review of 10 Systems were negative.    Social History:  Reviewed by me    Social History   Tobacco Use  . Smoking status: Current Every Day Smoker    Packs/day: 1.00    Years: 43.00    Pack years: 43.00    Last attempt to quit: 02/27/2012    Years since quitting: 4.8  . Smokeless tobacco: Never Used  Substance Use Topics  . Alcohol use: No    Alcohol/week: 0.0 oz    Comment: H/O ALCOHOLISM PERVIOUSLY A FIFTH OF VODKA A DAY       Family History :  Reviewed by me    Family History  Problem Relation Age of Onset  . Heart disease Mother   . Heart disease Father   . Hypertension Unknown      Home Medications:   Prior to Admission medications   Medication Sig Start Date End Date Taking? Authorizing Provider  allopurinol (ZYLOPRIM) 100 MG tablet Take 100 mg by mouth daily.    Yes [provider]  atorvastatin (LIPITOR) 40 MG tablet Take 40 mg by mouth at bedtime.  09/03/11  Yes [provider]  benazepril (LOTENSIN) 10 MG tablet Take 10 mg by mouth daily.  07/30/15  Yes [provider]  bismuth subsalicylate (PEPTO BISMOL) 262 MG/15ML suspension Take 30 mLs by mouth every 6 (six) hours as needed (nausea).   Yes [provider]  carvedilol (COREG) 6.25 MG tablet Take 12.5 mg 2 (two) times daily with a meal by mouth.    Yes [provider]  furosemide (LASIX) 20 MG tablet Take 20 mg by mouth daily.  10/25/15  Yes [provider]  levothyroxine (SYNTHROID, LEVOTHROID) 50 MCG tablet Take 1 tablet (50 mcg total) by mouth daily before breakfast. 10/15/15  Yes Short, Thea Silversmith, MD  nitroGLYCERIN (NITROSTAT) 0.4  MG SL tablet Place 1 tablet (0.4 mg total) under the tongue every 5 (five) minutes as needed for chest pain. 11/19/15  Yes Marinus Maw, MD  OVER THE COUNTER MEDICATION Take 1 tablet daily as needed by mouth (allergies). Multi-allergy tablet    Yes [provider]  warfarin (COUMADIN) 3 MG tablet Take 3 mg daily by mouth.   Yes [provider]     Allergies:     Allergies  Allergen Reactions  . Sulfa Antibiotics Nausea And Vomiting     Physical Exam:   Vitals  Blood pressure 112/81, pulse 63, resp. rate 18, height 6' (1.829 m), weight 102.1 kg (225 lb), SpO2 100 %.  Physical Examination: General appearance - alert, well appearing, and in no distress and Mental status - alert, oriented to person, place, and time,  Eyes - sclera anicteric Neck - supple, no JVD elevation , Chest - clear  to auscultation bilaterally, symmetrical air movement,  Heart - S1 and S2 normal, irregularly irregular Abdomen - soft, nontender, nondistended, no CVA tenderness Neurological - screening mental status exam normal, neck supple without rigidity, cranial nerves II through XII intact, DTR's normal and symmetric Extremities - no pedal edema noted, intact peripheral pulses  Skin - warm, dry Rectum-thrombosed, tender external hemorrhoid   Data Review:    CBC Recent Labs  Lab 12/25/16 0205 12/25/16 0950 12/25/16 1507  WBC 5.5  --   --   HGB 10.7* 8.9* 8.2*  HCT 31.2* 25.5* 24.0*  PLT 85*  --   --   MCV 94.5  --   --   MCH 32.4  --   --   MCHC 34.3  --   --   RDW 14.3  --   --   LYMPHSABS 1.3  --   --   MONOABS 0.6  --   --   EOSABS 0.1  --   --   BASOSABS 0.0  --   --     ------------------------------------------------------------------------------------------------------------------  Chemistries  Recent Labs  Lab 12/25/16 0205  NA 131*  K 3.2*  CL 91*  CO2 29  GLUCOSE 96  BUN 7  CREATININE 1.42*  CALCIUM 8.3*  AST 70*  ALT 29  ALKPHOS 72  BILITOT 0.9   ------------------------------------------------------------------------------------------------------------------ estimated creatinine clearance is 59.8 mL/min (A) (by C-G formula based on SCr of 1.42 mg/dL (H)). ------------------------------------------------------------------------------------------------------------------ No results for input(s): TSH, T4TOTAL, T3FREE, THYROIDAB in the last 72 hours.  Invalid input(s): FREET3   Coagulation profile Recent Labs  Lab 12/25/16 0205  INR 1.54   ------------------------------------------------------------------------------------------------------------------- No results for input(s): DDIMER in the last 72 hours. -------------------------------------------------------------------------------------------------------------------  Cardiac Enzymes No results for input(s): CKMB, TROPONINI, MYOGLOBIN in the last 168 hours.  Invalid input(s): CK ------------------------------------------------------------------------------------------------------------------    Component Value Date/Time   BNP 67.5 09/01/2016 1234     ---------------------------------------------------------------------------------------------------------------  Urinalysis    Component Value Date/Time   COLORURINE YELLOW 09/01/2016 1333   APPEARANCEUR CLEAR 09/01/2016 1333   LABSPEC 1.005 09/01/2016 1333   PHURINE 7.0 09/01/2016 1333   GLUCOSEU NEGATIVE 09/01/2016 1333   HGBUR NEGATIVE 09/01/2016 1333   BILIRUBINUR NEGATIVE 09/01/2016 1333   KETONESUR NEGATIVE 09/01/2016 1333   PROTEINUR NEGATIVE 09/01/2016 1333   UROBILINOGEN 1.0 09/04/2014 1554   NITRITE  NEGATIVE 09/01/2016 1333   LEUKOCYTESUR NEGATIVE 09/01/2016 1333    ----------------------------------------------------------------------------------------------------------------   Imaging Results:    Dg Chest Port 1 View  Result Date: 12/25/2016 CLINICAL DATA:  GI bleed for 1 day. No  chest complaints. Weakness this morning. EXAM: PORTABLE CHEST 1 VIEW COMPARISON:  09/01/2016 FINDINGS: Cardiac pacemaker. Heart size and pulmonary vascularity are normal. Calcified granuloma in the right lung apex. No airspace disease or consolidation. No blunting of costophrenic angles. No pneumothorax. Calcification of the aorta. IMPRESSION: No evidence of active pulmonary disease.  Aortic atherosclerosis. Electronically Signed   By: Burman Nieves M.D.   On: 12/25/2016 06:46    Radiological Exams on Admission: Dg Chest Port 1 View  Result Date: 12/25/2016 CLINICAL DATA:  GI bleed for 1 day. No chest complaints. Weakness this morning. EXAM: PORTABLE CHEST 1 VIEW COMPARISON:  09/01/2016 FINDINGS: Cardiac pacemaker. Heart size and pulmonary vascularity are normal. Calcified granuloma in the right lung apex. No airspace disease or consolidation. No blunting of costophrenic angles. No pneumothorax. Calcification of the aorta. IMPRESSION: No evidence of active pulmonary disease.  Aortic atherosclerosis. Electronically Signed   By: Burman Nieves M.D.   On: 12/25/2016 06:46    DVT Prophylaxis -SCD   AM Labs Ordered, also please review Full Orders  Family Communication: Admission, patients condition and plan of care including tests being ordered have been discussed with the patient  who indicate understanding and agree with the plan   Code Status - Full Code  Likely DC to  home  Condition   fair  Mariea Clonts, Toy Eisemann M.D on 12/25/2016 at 6:43 PM   Between 7am to 7pm - Pager - [phone] After 7pm go to [email or web address] - password [email or web address]  [facility] - Office  [phone]  Voice Recognition Reubin Milan  dictation system was used to create this note, attempts have been made to correct errors. Please contact the author with questions and/or clarifications.

## 2016-12-25 NOTE — ED Notes (Signed)
X RAY at bedside 

## 2016-12-25 NOTE — Progress Notes (Signed)
Pt complaint of right heel burning and painful to the touch, no acute injury noted, heels dry and intact.  MD made aware of pt complaints, no new orders received at this time, will continue to monitor.

## 2016-12-25 NOTE — Progress Notes (Signed)
Pt received from ED via stretcher with transporter, aaox4.  71 yo male with cc of rectal bleeding.  Pt escorted to [facility] and nursing Hydrographic surveyor) observed a bloody loose stool, pt rectum has no visible hemorrhoids, bloody drainage noted.  Place on tele, pt has AICD and is paced, NS infusing to right arm without complictions via pump at 24mls/hr. Currently NPO except sips with medications.  Pt also place on CIWA due to history of ETOH use and on High falls due to pt report of multiple falls at home. Oriented to room equipment, call bell in reach, bed locked and low, will continue to monitor.

## 2016-12-25 NOTE — Consult Note (Signed)
[facility] Consultation Note  Referring Provider: Dr. Shon Hale [facility]) Primary Care Physician:  Smothers, Cathleen Corti, NP Primary Gastroenterologist:  Dr. [name]  Reason for Consultation:  hematochezia  HPI: Don [name] is a 71 y.o. male admitted for hematochezia.  Started yesterday.  Multiple episodes.  No abdominal pain or hematemesis.  Prior episode in 2014 at which time sigmoidoscopy showed large anal "mass" which upon surgical evaluation was felt to be large thrombosed hemorrhoid; had diarrhea at that time, biopsies showed collagenous colitis.  He had prior complete colonoscopy in 2011 which also showed diverticulosis as well as the hemorrhoids.  Patient has multiple medical problems and is on warfarin.   Past Medical History:  Diagnosis Date  . Alcohol abuse    PREVIOUS H/O DRINKING A FIFTH OF VODKA  A DAY  . Anginal pain (HCC)   . Arthritis    hands & back  . CHF (congestive heart failure) (HCC)   . Chronic kidney disease    acute renal  04/2012  . Chronic systolic heart failure (HCC)    LEFT  . Coronary artery disease   . History of angina   . Hypertension   . ICD (implantable cardiac defibrillator) in place   . LBBB (left bundle branch block)   . Nonischemic cardiomyopathy (HCC)    EF 20% s/p INSERTION OF BI-V DEFIBRILLATOR.  . Stroke (HCC)    TIA's  . Thyroid disease    HYPOTHYROIDISM.....TREATED  . TIA (transient ischemic attack)     Past Surgical History:  Procedure Laterality Date  . CARDIAC CATHETERIZATION    . CYSTOSCOPY W/ STONE MANIPULATION    . INSERT / REPLACE / REMOVE PACEMAKER     BI-V IMPLANTABLE CARDIOVERTER-DEFIBRILLATOR. ; [employer]; MODEL  # [id number], SERIAL # [id number] H.  DR. Humberto Leep. EDMUNDS.  . TONSILLECTOMY    . VASECTOMY      Prior to Admission medications   Medication Sig Start Date End Date Taking? Authorizing Provider  allopurinol (ZYLOPRIM) 100 MG tablet Take 100 mg by mouth daily.    Yes  [provider]  atorvastatin (LIPITOR) 40 MG tablet Take 40 mg by mouth at bedtime.  09/03/11  Yes [provider]  benazepril (LOTENSIN) 10 MG tablet Take 10 mg by mouth daily.  07/30/15  Yes [provider]  bismuth subsalicylate (PEPTO BISMOL) 262 MG/15ML suspension Take 30 mLs by mouth every 6 (six) hours as needed (nausea).   Yes [provider]  carvedilol (COREG) 6.25 MG tablet Take 12.5 mg 2 (two) times daily with a meal by mouth.    Yes [provider]  furosemide (LASIX) 20 MG tablet Take 20 mg by mouth daily.  10/25/15  Yes [provider]  levothyroxine (SYNTHROID, LEVOTHROID) 50 MCG tablet Take 1 tablet (50 mcg total) by mouth daily before breakfast. 10/15/15  Yes Short, Thea Silversmith, MD  nitroGLYCERIN (NITROSTAT) 0.4 MG SL tablet Place 1 tablet (0.4 mg total) under the tongue every 5 (five) minutes as needed for chest pain. 11/19/15  Yes Marinus Maw, MD  OVER THE COUNTER MEDICATION Take 1 tablet daily as needed by mouth (allergies). Multi-allergy tablet    Yes [provider]  warfarin (COUMADIN) 3 MG tablet Take 3 mg daily by mouth.   Yes [provider]    Current Facility-Administered Medications  Medication Dose Route Frequency Provider Last Rate Last Dose  . 0.9 %  sodium chloride infusion   Intravenous Continuous Clydie Braun, MD 75 mL/hr  at 12/25/16 0659    . 0.9 %  sodium chloride infusion  250 mL Intravenous PRN Emokpae, Courage, MD      . 0.9 %  sodium chloride infusion   Intravenous Continuous Emokpae, Courage, MD 75 mL/hr at 12/25/16 1059    . acetaminophen (TYLENOL) tablet 650 mg  650 mg Oral Q6H PRN Emokpae, Courage, MD       Or  . acetaminophen (TYLENOL) suppository 650 mg  650 mg Rectal Q6H PRN Emokpae, Courage, MD      . albuterol (PROVENTIL) (2.5 MG/3ML) 0.083% nebulizer solution 2.5 mg  2.5 mg Nebulization Q2H PRN Emokpae, Courage, MD      . allopurinol (ZYLOPRIM) tablet 100 mg  100 mg Oral  Daily Emokpae, Courage, MD   100 mg at 12/25/16 1106  . atorvastatin (LIPITOR) tablet 40 mg  40 mg Oral QHS Emokpae, Courage, MD      . bismuth subsalicylate (PEPTO BISMOL) 262 MG/15ML suspension 30 mL  30 mL Oral Q6H PRN Emokpae, Courage, MD      . carvedilol (COREG) tablet 12.5 mg  12.5 mg Oral BID WC Emokpae, Courage, MD   12.5 mg at 12/25/16 1112  . folic acid (FOLVITE) tablet 1 mg  1 mg Oral Daily Emokpae, Courage, MD   1 mg at 12/25/16 1106  . [START ON 12/26/2016] Influenza vac split quadrivalent PF (FLUZONE HIGH-DOSE) injection 0.5 mL  0.5 mL Intramuscular Tomorrow-1000 Emokpae, Courage, MD      . levothyroxine (SYNTHROID, LEVOTHROID) tablet 50 mcg  50 mcg Oral QAC breakfast Mariea Clonts, Courage, MD   50 mcg at 12/25/16 1113  . LORazepam (ATIVAN) injection 0-4 mg  0-4 mg Intravenous Q6H Emokpae, Courage, MD       Followed by  . [START ON 12/27/2016] LORazepam (ATIVAN) injection 0-4 mg  0-4 mg Intravenous Q12H Emokpae, Courage, MD      . LORazepam (ATIVAN) injection 1 mg  1 mg Intravenous Q4H PRN Emokpae, Courage, MD      . LORazepam (ATIVAN) tablet 1 mg  1 mg Oral Q6H PRN Shon Hale, MD       Or  . LORazepam (ATIVAN) injection 1 mg  1 mg Intravenous Q6H PRN Emokpae, Courage, MD      . multivitamin with minerals tablet 1 tablet  1 tablet Oral Daily Shon Hale, MD   Stopped at 12/25/16 1258  . nitroGLYCERIN (NITROSTAT) SL tablet 0.4 mg  0.4 mg Sublingual Q5 min PRN Emokpae, Courage, MD      . ondansetron (ZOFRAN) tablet 4 mg  4 mg Oral Q6H PRN Emokpae, Courage, MD   4 mg at 12/25/16 1113   Or  . ondansetron (ZOFRAN) injection 4 mg  4 mg Intravenous Q6H PRN Emokpae, Courage, MD      . pantoprazole (PROTONIX) injection 40 mg  40 mg Intravenous Q12H Emokpae, Courage, MD   40 mg at 12/25/16 1100  . polyethylene glycol (MIRALAX / GLYCOLAX) packet 17 g  17 g Oral Daily PRN Emokpae, Courage, MD      . senna (SENOKOT) tablet 8.6 mg  1 tablet Oral BID Emokpae, Courage, MD      . sodium  chloride flush (NS) 0.9 % injection 3 mL  3 mL Intravenous Q12H Emokpae, Courage, MD   3 mL at 12/25/16 1101  . sodium chloride flush (NS) 0.9 % injection 3 mL  3 mL Intravenous PRN Emokpae, Courage, MD      . thiamine (VITAMIN B-1) tablet 100 mg  100 mg  Oral Daily Mariea Clonts, Courage, MD   100 mg at 12/25/16 1112  . traZODone (DESYREL) tablet 50 mg  50 mg Oral QHS PRN Shon Hale, MD        Allergies as of 12/25/2016 - Review Complete 12/25/2016  Allergen Reaction Noted  . Sulfa antibiotics Nausea And Vomiting 07/10/2010    Family History  Problem Relation Age of Onset  . Heart disease Mother   . Heart disease Father   . Hypertension Unknown     Social History   Socioeconomic History  . Marital status: Widowed    Spouse name: Not on file  . Number of children: Not on file  . Years of education: Not on file  . Highest education level: Not on file  Social Needs  . Financial resource strain: Not on file  . Food insecurity - worry: Not on file  . Food insecurity - inability: Not on file  . Transportation needs - medical: Not on file  . Transportation needs - non-medical: Not on file  Occupational History  . Occupation: RETIRED    Comment: SOCIAL WORKER  Tobacco Use  . Smoking status: Current Every Day Smoker    Packs/day: 1.00    Years: 43.00    Pack years: 43.00    Last attempt to quit: 02/27/2012    Years since quitting: 4.8  . Smokeless tobacco: Never Used  Substance and Sexual Activity  . Alcohol use: No    Alcohol/week: 0.0 oz    Comment: H/O ALCOHOLISM PERVIOUSLY A FIFTH OF VODKA A DAY  . Drug use: No  . Sexual activity: Not Currently  Other Topics Concern  . Not on file  Social History Narrative   BI-V IMPLANTABLE CARDIOVERTER-DEFIBRILLATOR; [employer], MODEL # [id number]; SERIAL # [id number] H;       RETIRED SOCIAL WORKER      MARRIED 4 TIMES      H/O ALCOHOLISM; A FIFTH OF VODKA DAILY IN THE PAST             Review of Systems: As per HPI, all  others negative  Physical Exam: Vital signs in last 24 hours: Pulse Rate:  [43-80] 75 (11/08 0730) Resp:  [11-20] 15 (11/08 0730) BP: (98-122)/(70-93) 105/71 (11/08 0730) SpO2:  [97 %-100 %] 98 % (11/08 0730) Weight:  [225 lb (102.1 kg)] 225 lb (102.1 kg) (11/08 0140) Last BM Date: 12/25/16 General:   Alert,  Well-developed, chronically ill-appearing, NAD Head:  Normocephalic and atraumatic. Eyes:  Sclera clear, no icterus.   Conjunctiva pale Ears:  Normal auditory acuity. Nose:  No deformity, discharge,  or lesions. Mouth:  No deformity or lesions.  Oropharynx pale and dry Neck:  Supple; no masses or thyromegaly. Lungs:  Clear throughout to auscultation.   No wheezes, crackles, or rhonchi. No acute distress. Heart:  Regular rate and rhythm; no murmurs, clicks, rubs,  or gallops. Abdomen:  Soft, protuberant, nontender and nondistended. No masses, hepatosplenomegaly or hernias noted. Normal bowel sounds, without guarding, and without rebound.     Rectal:  Large tender external hemorrhoid; pain upon rectal exam, couldn't complete rectal exam due to discomfort. Msk:  Symmetrical without gross deformities. Normal posture. Pulses:  Normal pulses noted. Extremities:  Without clubbing or edema. Neurologic:  Alert and  oriented x4; diffusely weak otherwise grossly normal neurologically. Skin:  Scattered ecchymoses, otherwise intact without significant lesions or rashes. Cervical Nodes:  No significant cervical adenopathy. Psych:  Alert and cooperative. Normal mood and affect.   Lab Results: Recent Labs  12/25/16 0205 12/25/16 0950  WBC 5.5  --   HGB 10.7* 8.9*  HCT 31.2* 25.5*  PLT 85*  --    BMET Recent Labs    12/25/16 0205  NA 131*  K 3.2*  CL 91*  CO2 29  GLUCOSE 96  BUN 7  CREATININE 1.42*  CALCIUM 8.3*   LFT Recent Labs    12/25/16 0205  PROT 6.0*  ALBUMIN 3.0*  AST 70*  ALT 29  ALKPHOS 72  BILITOT 0.9   PT/INR Recent Labs    12/25/16 0205  LABPROT  18.3*  INR 1.54    Studies/Results: Dg Chest Port 1 View  Result Date: 12/25/2016 CLINICAL DATA:  GI bleed for 1 day. No chest complaints. Weakness this morning. EXAM: PORTABLE CHEST 1 VIEW COMPARISON:  09/01/2016 FINDINGS: Cardiac pacemaker. Heart size and pulmonary vascularity are normal. Calcified granuloma in the right lung apex. No airspace disease or consolidation. No blunting of costophrenic angles. No pneumothorax. Calcification of the aorta. IMPRESSION: No evidence of active pulmonary disease.  Aortic atherosclerosis. Electronically Signed   By: Burman NievesWilliam  Stevens M.D.   On: 12/25/2016 06:46    Impression:  1.  Hematochezia, recurrent, on warfarin.   2.  Anemia. 3.  Large external hemorrhoids, possible source of #1 above.  Plan:  1.  Medical therapy for hemorrhoids. 2.  Serial CBCs, volume repletion. 3.  Hold anticoagulation. 4.  Clear liquid diet ok. 5.  If bleeding recurs, consider tagged RBC study as next step in management. 6.  [facility] will follow.    LOS: 0 days   Calyb Mcquarrie M  12/25/2016, 3:01 PM  Cell [phone] If no answer or after 5 PM call [phone]

## 2016-12-25 NOTE — ED Triage Notes (Signed)
Pt coming from home bib [facility]. Pt was using the bathroom when he whipped he saw bright red blood. when ems arrived pt bp lying was 120/100 then sitting 90/60 and standing 70/40. Pt is axox4

## 2016-12-25 NOTE — Progress Notes (Signed)
MEDICATION RELATED NOTE   Pharmacy Re:  Levothyroxine  T4 level in 2014 = 1.64 (within goal range) TSH levels: 2014 = 0.035 2017 = 0.112 2017 = 0.137 2017 = 0.083  Home med:  Levothyroxine daily  Plan:  Levels are low - may need dose reduction.  Consider rechecking levels and adjusting if you feel appropriate.   Medications:  Medications Prior to Admission  Medication Sig Dispense Refill Last Dose  . allopurinol (ZYLOPRIM) 100 MG tablet Take 100 mg by mouth daily.    12/24/2016 at Unknown time  . atorvastatin (LIPITOR) 40 MG tablet Take 40 mg by mouth at bedtime.    12/24/2016 at Unknown time  . benazepril (LOTENSIN) 10 MG tablet Take 10 mg by mouth daily.    12/24/2016 at Unknown time  . bismuth subsalicylate (PEPTO BISMOL) 262 MG/15ML suspension Take 30 mLs by mouth every 6 (six) hours as needed (nausea).   unk  . carvedilol (COREG) 6.25 MG tablet Take 12.5 mg 2 (two) times daily with a meal by mouth.    12/24/2016 at 0830  . furosemide (LASIX) 20 MG tablet Take 20 mg by mouth daily.    12/24/2016 at Unknown time  . levothyroxine (SYNTHROID, LEVOTHROID) 50 MCG tablet Take 1 tablet (50 mcg total) by mouth daily before breakfast. 30 tablet 0 12/24/2016 at Unknown time  . nitroGLYCERIN (NITROSTAT) 0.4 MG SL tablet Place 1 tablet (0.4 mg total) under the tongue every 5 (five) minutes as needed for chest pain. 25 tablet 3 unk  . OVER THE COUNTER MEDICATION Take 1 tablet daily as needed by mouth (allergies). Multi-allergy tablet    unk  . warfarin (COUMADIN) 3 MG tablet Take 3 mg daily by mouth.   12/24/2016 at 0830    Nadara Mustard, PharmD., MS Clinical Pharmacist Pager:  [phone] Thank you for allowing pharmacy to be part of this patients care team. 12/25/2016,8:28 AM

## 2016-12-25 NOTE — Plan of Care (Signed)
[name] is a 71 year old male with A.Fib on Coumadin,  HTN, NICM and CHF with EF of 20% that improved to 50-55% post bi-ventricular pacer placement; who presents with complaints of bright red blood per rectum.  EMS noted systolic blood pressure dropped to 70s with standing, vital signs of the last stable here.  Labs reveal hemoglobin 10.8, platelets 85, INR 1.54 despite being on Coumadin.  Patient given 1 L of normal saline IV fluids and [facility] called to admit.  Will need consult to GI in a.m. Accepted to a telemetry bed.

## 2016-12-25 NOTE — ED Notes (Signed)
MD notified about blood pressure. Plan on monitoring it due to hemoglobin being stable.

## 2016-12-25 NOTE — Progress Notes (Signed)
Pt BP 79/44, confirmed with manual cuff. Pulse 60, RR 12. Pt c/o fatigue. Notified Blount, NP. Received order for fluid bolus. Completed per order, New BP 112/82, P 75, RR 14. Blount, NP notified. Will continue to monitor.

## 2016-12-26 DIAGNOSIS — I4891 Unspecified atrial fibrillation: Secondary | ICD-10-CM

## 2016-12-26 DIAGNOSIS — K922 Gastrointestinal hemorrhage, unspecified: Secondary | ICD-10-CM

## 2016-12-26 LAB — CBC
HEMATOCRIT: 24 % — AB (ref 39.0–52.0)
HEMOGLOBIN: 8.2 g/dL — AB (ref 13.0–17.0)
MCH: 32.8 pg (ref 26.0–34.0)
MCHC: 34.2 g/dL (ref 30.0–36.0)
MCV: 96 fL (ref 78.0–100.0)
Platelets: 61 10*3/uL — ABNORMAL LOW (ref 150–400)
RBC: 2.5 MIL/uL — AB (ref 4.22–5.81)
RDW: 14.5 % (ref 11.5–15.5)
WBC: 2.4 10*3/uL — AB (ref 4.0–10.5)

## 2016-12-26 LAB — BASIC METABOLIC PANEL
ANION GAP: 8 (ref 5–15)
BUN: 5 mg/dL — ABNORMAL LOW (ref 6–20)
CO2: 25 mmol/L (ref 22–32)
Calcium: 7 mg/dL — ABNORMAL LOW (ref 8.9–10.3)
Chloride: 98 mmol/L — ABNORMAL LOW (ref 101–111)
Creatinine, Ser: 1.04 mg/dL (ref 0.61–1.24)
GFR calc Af Amer: >60 mL/min (ref >60)
GLUCOSE: 86 mg/dL (ref 65–99)
POTASSIUM: 2.8 mmol/L — AB (ref 3.5–5.1)
SODIUM: 131 mmol/L — AB (ref 135–145)

## 2016-12-26 MED ORDER — BOOST / RESOURCE BREEZE PO LIQD: 1.0000 | Freq: Two times a day (BID) | ORAL | Status: DC

## 2016-12-26 MED ORDER — CARVEDILOL 6.25 MG PO TABS
6.2500 mg | ORAL_TABLET | Freq: Two times a day (BID) | ORAL | Status: DC
  Administered 2016-12-26 – 2016-12-27 (×2): 6.25 mg via ORAL
  Filled 2016-12-26 (×2): qty 1

## 2016-12-26 MED ORDER — POTASSIUM CHLORIDE 10 MEQ/100ML IV SOLN
10.0000 meq | INTRAVENOUS | Status: DC
  Administered 2016-12-26 (×4): 10 meq via INTRAVENOUS
  Filled 2016-12-26 (×4): qty 100

## 2016-12-26 MED ORDER — POTASSIUM CHLORIDE CRYS ER 20 MEQ PO TBCR
40.0000 meq | EXTENDED_RELEASE_TABLET | Freq: Once | ORAL | Status: AC
  Administered 2016-12-26: 40 meq via ORAL
  Filled 2016-12-26: qty 2

## 2016-12-26 NOTE — Progress Notes (Signed)
Initial Nutrition Assessment  DOCUMENTATION CODES:   Not applicable  INTERVENTION:   - Boost Breeze BID; each supplement provides 250 kcals and 9 grams of protein  NUTRITION DIAGNOSIS:   Increased nutrient needs related to acute illness(GI bleed) as evidenced by estimated needs.  GOAL:   Patient will meet greater than or equal to 90% of their needs  MONITOR:   PO intake, Supplement acceptance, Labs, Weight trends  REASON FOR ASSESSMENT:   Malnutrition Screening Tool    ASSESSMENT:   Pt is a 71 year old male with PMH of A.Fibon Coumadin, HTN,NICM and CHF. Pt presents with complaints of bright red blood per rectum that started on 12/24/16 r/t a GI bleed. No abdominal pain.  Pt on clear liquid diet. He reports that he has been consuming most of his broths, jello, and juices. He reports a good appetite at home PTA. For breakfast he typically eats cereal with bacon and eggs. Lunch- hamburger and fries. Dinner- spaghetti or meat with veggies. Pt cooks for himself and notes that he is a widower.   Pt reports intentionally losing about 10 lbs in the last 3-4 months by watching his portion sizes. Pt reports a UBW of 225 lbs, 235 prior to weight loss. Per chart, pt weighed 225 on 11/8. All other weights are from 2017- ranging from 218-227 lbs.   Medications- Folvite, MVI, Protonix, Senokot, Vitamin B1  Labs- Na 131 (L), K 2.8 (L), BUN 5 (L), Ca 7 (L)  NUTRITION - FOCUSED PHYSICAL EXAM:    Most Recent Value  Orbital Region  No depletion  Upper Arm Region  Mild depletion  Thoracic and Lumbar Region  No depletion  Buccal Region  No depletion  Temple Region  Mild depletion  Clavicle Bone Region  No depletion  Clavicle and Acromion Bone Region  No depletion  Scapular Bone Region  No depletion  Dorsal Hand  No depletion  Patellar Region  No depletion  Anterior Thigh Region  Mild depletion  Posterior Calf Region  Mild depletion  Edema (RD Assessment)  None  Hair  Reviewed   Eyes  Reviewed  Mouth  Reviewed  Skin  Reviewed  Nails  Reviewed       Diet Order:  Diet clear liquid Room service appropriate? Yes with Assist; Fluid consistency: Thin  EDUCATION NEEDS:   No education needs have been identified at this time  Skin:  Skin Assessment: Reviewed RN Assessment  Last BM:  11/8  Height:   Ht Readings from Last 1 Encounters:  12/25/16 6' (1.829 m)    Weight:   Wt Readings from Last 1 Encounters:  12/25/16 225 lb (102.1 kg)    Ideal Body Weight:  80.9 kg  BMI:  Body mass index is 30.52 kg/m.  Estimated Nutritional Needs:   Kcal:  2200-2400 kcals  Protein:  110-120 grams  Fluid:  >/= 2 L    Wynetta [facility] [place] Dietetic Intern Pager: [phone] 12/26/2016 4:11 PM

## 2016-12-26 NOTE — Progress Notes (Signed)
PROGRESS NOTE    Don [name]  ZOX:[id number] DOB: 01/10/1946 DOA: 12/25/2016 PCP: Glenetta BorgSmothers, Cathleen Cortieborah N, NP   Brief Narrative:  Don [name]  is a 71 y.o. male with past medical history relevant forA.Fibon Coumadin, HTN,NICM and CHF with EF of 20% that improved to50-55%post bi-ventricular pacer placement;who presents with complaints of bright red blood per rectum that started around 2330 pm on 12/24/16.   Patient reports no significant abdominal pain, he has had multiple episodes of bright red blood per rectum without corresponding abdominal discomfort, EMS noted systolic blood pressure dropped to 70s with standing, vital signs of the last stable here.   Assessment & Plan:   Principal Problem:  1) Gi Bleed- - monitoring cbc's - consulted GI:  1.  Hold anticoagulation for the next 5 days if clinically feasible from cardiac perspective. 2.  Miralax 17 grams po qd upon discharge to avoid constipation/straining. 3.  Continue topical therapy (Anusol-HC cream) for hemorrhoids. 4.  Advance diet. 5.  If patient has no further bleeding over next 24 hours and tolerates advance in diet, could consider discharge home tomorrow or Sunday from GI perspective. 6.  [facility] will follow.  2) Etoh Abuse - Continue lorazepam CIWA protocol, thiamine and folic acid   3) NICM-  EF is now over 50% after AICD placement,   4) Afib - on coumadin , INR is currently 1.5,  hold Coumadin for now  5) Aki- - resolved and most likely due to prerenal etiology  6) Acute on Chronic Anemia- due to acute Gi bleed, please see #1 above, chronic anemia which is gotten worse due to acute blood loss anemia superimposed on his baseline chronic anemia, management as above #1   DVT prophylaxis: SCD's Code Status: Full Family Communication: None at bedside Disposition Plan:    Consultants:   GI: Dr. Lenoria FarrierW Outlaw   Procedures:    Antimicrobials: none   Subjective: Pt has no new complaints  currently  Objective: Vitals:   12/25/16 2219 12/25/16 2344 12/26/16 0600 12/26/16 1322  BP: (!) 79/44 112/82 117/85 94/73  Pulse: 60 75 86 64  Resp: 12 14 14 18   Temp:   99.1 F (37.3 C) 98.4 F (36.9 C)  TempSrc:   Oral Oral  SpO2:  98% 100% 99%  Weight:      Height:        Intake/Output Summary (Last 24 hours) at 12/26/2016 1608 Last data filed at 12/26/2016 1000 Gross per 24 hour  Intake 900 ml  Output 200 ml  Net 700 ml   Filed Weights   12/25/16 0140  Weight: 102.1 kg (225 lb)    Examination:  General exam: Appears calm and comfortable, in nad. Respiratory system: Clear to auscultation. Respiratory effort normal. Cardiovascular system: S1 & S2 heard, RRR. No JVD, murmurs, rubs, gallops or clicks. No pedal edema. Gastrointestinal system: Abdomen is nondistended, soft and nontender. No organomegaly or masses felt. Normal bowel sounds heard. Central nervous system: Alert and oriented. No focal neurological deficits. Extremities: Symmetric 5 x 5 power. Skin: No rashes, lesions or ulcers, on limited exam. Psychiatry: Mood & affect appropriate.   Data Reviewed: I have personally reviewed following labs and imaging studies  CBC: Recent Labs  Lab 12/25/16 0205 12/25/16 0950 12/25/16 1507 12/25/16 2007 12/26/16 0232  WBC 5.5  --   --   --  2.4*  NEUTROABS 3.5  --   --   --   --   HGB 10.7* 8.9* 8.2* 8.5* 8.2*  HCT 31.2* 25.5* 24.0* 24.6* 24.0*  MCV 94.5  --   --   --  96.0  PLT 85*  --   --   --  61*   Basic Metabolic Panel: Recent Labs  Lab 12/25/16 0205 12/26/16 0232  NA 131* 131*  K 3.2* 2.8*  CL 91* 98*  CO2 29 25  GLUCOSE 96 86  BUN 7 5*  CREATININE 1.42* 1.04  CALCIUM 8.3* 7.0*   GFR: Estimated Creatinine Clearance: 81.7 mL/min (by C-G formula based on SCr of 1.04 mg/dL). Liver Function Tests: Recent Labs  Lab 12/25/16 0205  AST 70*  ALT 29  ALKPHOS 72  BILITOT 0.9  PROT 6.0*  ALBUMIN 3.0*   No results for input(s): LIPASE, AMYLASE  in the last 168 hours. No results for input(s): AMMONIA in the last 168 hours. Coagulation Profile: Recent Labs  Lab 12/25/16 0205  INR 1.54   Cardiac Enzymes: No results for input(s): CKTOTAL, CKMB, CKMBINDEX, TROPONINI in the last 168 hours. BNP (last 3 results) No results for input(s): PROBNP in the last 8760 hours. HbA1C: No results for input(s): HGBA1C in the last 72 hours. CBG: No results for input(s): GLUCAP in the last 168 hours. Lipid Profile: No results for input(s): CHOL, HDL, LDLCALC, TRIG, CHOLHDL, LDLDIRECT in the last 72 hours. Thyroid Function Tests: No results for input(s): TSH, T4TOTAL, FREET4, T3FREE, THYROIDAB in the last 72 hours. Anemia Panel: No results for input(s): VITAMINB12, FOLATE, FERRITIN, TIBC, IRON, RETICCTPCT in the last 72 hours. Sepsis Labs: Recent Labs  Lab 12/25/16 0244 12/25/16 0507  LATICACIDVEN 1.61 0.77    No results found for this or any previous visit (from the past 240 hour(s)).       Radiology Studies: Dg Chest Port 1 View  Result Date: 12/25/2016 CLINICAL DATA:  GI bleed for 1 day. No chest complaints. Weakness this morning. EXAM: PORTABLE CHEST 1 VIEW COMPARISON:  09/01/2016 FINDINGS: Cardiac pacemaker. Heart size and pulmonary vascularity are normal. Calcified granuloma in the right lung apex. No airspace disease or consolidation. No blunting of costophrenic angles. No pneumothorax. Calcification of the aorta. IMPRESSION: No evidence of active pulmonary disease.  Aortic atherosclerosis. Electronically Signed   By: Burman Nieves M.D.   On: 12/25/2016 06:46        Scheduled Meds: . allopurinol  100 mg Oral Daily  . atorvastatin  40 mg Oral QHS  . carvedilol  6.25 mg Oral BID WC  . folic acid  1 mg Oral Daily  . hydrocortisone  1 application Rectal TID  . levothyroxine  50 mcg Oral QAC breakfast  . LORazepam  0-4 mg Intravenous Q6H   Followed by  . [START ON 12/27/2016] LORazepam  0-4 mg Intravenous Q12H  .  multivitamin with minerals  1 tablet Oral Daily  . pantoprazole (PROTONIX) IV  40 mg Intravenous Q12H  . senna  1 tablet Oral BID  . sodium chloride flush  3 mL Intravenous Q12H  . thiamine  100 mg Oral Daily   Continuous Infusions: . sodium chloride 75 mL/hr at 12/25/16 0659  . sodium chloride    . sodium chloride 75 mL/hr at 12/25/16 1059     LOS: 1 day    Time spent: > 35 minutes    Penny Pia, MD [facility] Pager [phone]  If 7PM-7AM, please contact night-coverage [email or web address] Password [email or web address] 12/26/2016, 4:08 PM

## 2016-12-26 NOTE — Progress Notes (Signed)
Subjective: Feels much better. No abdominal pain. No further bleeding.  Objective: Vital signs in last 24 hours: Temp:  [98.5 F (36.9 C)-99.1 F (37.3 C)] 99.1 F (37.3 C) (11/09 0600) Pulse Rate:  [60-86] 86 (11/09 0600) Resp:  [12-18] 14 (11/09 0600) BP: (79-117)/(44-85) 117/85 (11/09 0600) SpO2:  [98 %-100 %] 100 % (11/09 0600) Weight change:  Last BM Date: 12/25/16  PE: GEN:  NAD  Lab Results: CBC    Component Value Date/Time   WBC 2.4 (L) 12/26/2016 0232   RBC 2.50 (L) 12/26/2016 0232   HGB 8.2 (L) 12/26/2016 0232   HGB 13.4 02/14/2014 0916   HCT 24.0 (L) 12/26/2016 0232   HCT 41.1 02/14/2014 0916   PLT 61 (L) 12/26/2016 0232   PLT 165 02/14/2014 0916   MCV 96.0 12/26/2016 0232   MCV 89.7 02/14/2014 0916   MCH 32.8 12/26/2016 0232   MCHC 34.2 12/26/2016 0232   RDW 14.5 12/26/2016 0232   RDW 17.1 (H) 02/14/2014 0916   LYMPHSABS 1.3 12/25/2016 0205   LYMPHSABS 1.5 02/14/2014 0916   MONOABS 0.6 12/25/2016 0205   MONOABS 0.7 02/14/2014 0916   EOSABS 0.1 12/25/2016 0205   EOSABS 0.1 02/14/2014 0916   BASOSABS 0.0 12/25/2016 0205   BASOSABS 0.1 02/14/2014 0916   CMP     Component Value Date/Time   NA 131 (L) 12/26/2016 0232   NA 138 02/14/2014 0916   K 2.8 (L) 12/26/2016 0232   K 3.7 02/14/2014 0916   CL 98 (L) 12/26/2016 0232   CO2 25 12/26/2016 0232   CO2 31 (H) 02/14/2014 0916   GLUCOSE 86 12/26/2016 0232   GLUCOSE 77 02/14/2014 0916   BUN 5 (L) 12/26/2016 0232   BUN 8.2 02/14/2014 0916   CREATININE 1.04 12/26/2016 0232   CREATININE 0.8 02/14/2014 0916   CALCIUM 7.0 (L) 12/26/2016 0232   CALCIUM 8.6 02/14/2014 0916   PROT 6.0 (L) 12/25/2016 0205   PROT 6.3 (L) 09/28/2013 1340   ALBUMIN 3.0 (L) 12/25/2016 0205   ALBUMIN 3.0 (L) 09/28/2013 1340   AST 70 (H) 12/25/2016 0205   AST 24 09/28/2013 1340   ALT 29 12/25/2016 0205   ALT 12 09/28/2013 1340   ALKPHOS 72 12/25/2016 0205   ALKPHOS 72 09/28/2013 1340   BILITOT 0.9 12/25/2016 0205   BILITOT 0.49 09/28/2013 1340   GFRNONAA >60 12/26/2016 0232   GFRNONAA 85 02/13/2014 1401   GFRAA >60 12/26/2016 0232   GFRAA >89 02/13/2014 1401   Assessment:  1.  Hematochezia, recurrent, on warfarin.   No bleeding overnight.  Patient feels better. 2.  Anemia. 3.  Large external hemorrhoids, possible source of #1 above.  Plan:  1.  Hold anticoagulation for the next 5 days if clinically feasible from cardiac perspective. 2.  Miralax 17 grams po qd upon discharge to avoid constipation/straining. 3.  Continue topical therapy (Anusol-HC cream) for hemorrhoids. 4.  Advance diet. 5.  If patient has no further bleeding over next 24 hours and tolerates advance in diet, could consider discharge home tomorrow or Sunday from GI perspective. 6.  [facility] will follow.   Don [name] 12/26/2016, 10:36 AM   Cell [phone] If no answer or after 5 PM call [phone]

## 2016-12-27 MED ORDER — HYDROCORTISONE 2.5 % RE CREA: 1.0000 "application " | TOPICAL_CREAM | Freq: Three times a day (TID) | RECTAL | 0 refills | Status: DC

## 2016-12-27 MED ORDER — POTASSIUM CHLORIDE CRYS ER 20 MEQ PO TBCR: 40.0000 meq | EXTENDED_RELEASE_TABLET | Freq: Once | ORAL | Status: DC

## 2016-12-27 MED ORDER — POLYETHYLENE GLYCOL 3350 17 G PO PACK: 17.0000 g | PACK | Freq: Every day | ORAL | 0 refills | Status: DC | PRN

## 2016-12-27 MED ORDER — FERROUS SULFATE 325 (65 FE) MG PO TABS: 325.0000 mg | ORAL_TABLET | Freq: Two times a day (BID) | ORAL | 0 refills | Status: DC

## 2016-12-27 NOTE — Care Management Note (Signed)
Case Management Note  Patient Details  Name: Don [name] MRN: [id number] Date of Birth: 08-23-1945  Subjective/Objective:    Pt admitted with GI bleed            Action/Plan:  PTA independent from home.  Pt will discharge home to daughters home.  Pt declined HH/DME and possible placement even if recommended by PT.  PT has not been ordered and pt states he will not wait for evaluation.  Pt states he has both walker and canes in the home, has active relationship with PCP and denied barriers to obtaining and paying for medications.  No CM needs determined prior to discharge, discharge order written and CM signing off.   Expected Discharge Date:  12/27/16               Expected Discharge Plan:  Home/Self Care  In-House Referral:     Discharge planning Services  CM Consult  Post Acute Care Choice:    Choice offered to:     DME Arranged:    DME Agency:     HH Arranged:    HH Agency:     Status of Service:  Completed, signed off  If discussed at [employer] of Stay Meetings, dates discussed:    Additional Comments:  Cherylann Parr, RN 12/27/2016, 11:22 AM

## 2016-12-27 NOTE — Progress Notes (Signed)
NURSING PROGRESS NOTE  ISHAQ BATIS [id number] Discharge Data: 12/27/2016 11:59 AM Attending Provider: Penny Pia, MD FMB:[id number], Cathleen Corti, NP   Erling Conte to be D/C'd Home per MD order.    All IV's will be discontinued and monitored for bleeding.  All belongings will be returned to patient for patient to take home.  Last Documented Vital Signs:  Blood pressure 115/75, pulse 68, temperature 98.9 F (37.2 C), temperature source Oral, resp. rate 18, height 6' (1.829 m), weight 102.1 kg (225 lb), SpO2 98 %.  Madelin Rear, MSN, RN, [employer]

## 2016-12-27 NOTE — Discharge Summary (Signed)
Physician Discharge Summary  Don ConteCharles L [name] [id number] DOB: 06/14/1945 DOA: 12/25/2016  PCP: Lizbeth BarkSmothers, Deborah N, NP  Admit date: 12/25/2016 Discharge date: 12/27/2016  Time spent: > 35 minutes  Recommendations for Outpatient Follow-up:  1. Coumadin will need to be held 5 days after hospital discharge. May be restarted after that point but monitor INR levels 2. Monitor K levels and replace accordingly   Discharge Diagnoses:  Principal Problem:   GI bleed Active Problems:   ETOH abuse   Rectal bleeding   Essential hypertension   Tobacco use   Atrial fibrillation (HCC) [I48.91]   Discharge Condition: stable  Diet recommendation: Heart healthy  Filed Weights   12/25/16 0140  Weight: 102.1 kg (225 lb)    History of present illness:  71 y.o. male with past medical history relevant forA.Fibon Coumadin, HTN,NICM and CHF with EF of 20% that improved to50-55%post bi-ventricular pacer placement;who presents with complaints of bright red blood per rectum that started around 2330 pm on 12/24/16.   Pt was subsequently hospitalized and Gi specialist consulted.  Hospital Course:  1) Gi Bleed- - has ceased patient demanding discharge. hgb stable - consulted GI:  1. Hold anticoagulation for the next 5 days if clinically feasible from cardiac perspective. 2. Miralax 17 grams po qd upon discharge to avoid constipation/straining. 3. Continue topical therapy (Anusol-HC cream) for hemorrhoids. 4. Advance diet. 5. If patient has no further bleeding over next 24 hours and tolerates advance in diet, could consider discharge home tomorrow or Sunday from GI perspective. 6. [facility] will follow.  I will d/c with ferrous sulfate script. - I discussed holding his Warfarin for 5 days and then restarting. He verbalizes agreement and understanding. Daughter present at bedside.  2) Etoh Abuse - no anxiety symptoms.  3) NICM-EF is now over 50% after AICD placement,    4) Afib - coumadin to be held for 5 days. He is to f/u with his pcp for further evaluation and recommendations. Pt has hgb around 8 and is at higher risk of morbidity/mortality from bleeding than he is for stroke. As such agree with holding coumadin.  5) Aki- - resolved and most likely due to prerenal etiology  6) Acute on Chronic Anemia- due to acute Gi bleed,please see #1 above,chronic anemia which is gotten worse due to acute blood loss anemia superimposed on his baseline chronic anemia,management as above #1   Procedures:  None  Consultations:  GI specialist: Dr. Dulce Sellarutlaw  Discharge Exam: Vitals:   12/26/16 2150 12/27/16 0515  BP: 108/78 115/75  Pulse: 71 68  Resp: 18 18  Temp: 98.5 F (36.9 C) 98.9 F (37.2 C)  SpO2: 98% 98%    General: Pt in nad, alert and awake Cardiovascular: s1 and s2 wnl, no rubs Respiratory: no increased wob, no wheezes  Discharge Instructions   Discharge Instructions    Diet - low sodium heart healthy   Complete by:  As directed    Discharge instructions   Complete by:  As directed    Please be sure to follow up with your primary care physician in 1 week so that you can start having your INR monitored and they can decide whether or not restart your ACE Inhibitor.   Increase activity slowly   Complete by:  As directed      Current Discharge Medication List    START taking these medications   Details  ferrous sulfate 325 (65 FE) MG tablet Take 1 tablet (325 mg total) 2 (two)  times daily with a meal by mouth. Qty: 60 tablet, Refills: 0    hydrocortisone (ANUSOL-HC) 2.5 % rectal cream Place 1 application 3 (three) times daily rectally. Qty: 30 g, Refills: 0    polyethylene glycol (MIRALAX / GLYCOLAX) packet Take 17 g daily as needed by mouth for mild constipation. Qty: 14 each, Refills: 0      CONTINUE these medications which have NOT CHANGED   Details  allopurinol (ZYLOPRIM) 100 MG tablet Take 100 mg by mouth daily.      atorvastatin (LIPITOR) 40 MG tablet Take 40 mg by mouth at bedtime.     bismuth subsalicylate (PEPTO BISMOL) 262 MG/15ML suspension Take 30 mLs by mouth every 6 (six) hours as needed (nausea).    carvedilol (COREG) 6.25 MG tablet Take 12.5 mg 2 (two) times daily with a meal by mouth.     furosemide (LASIX) 20 MG tablet Take 20 mg by mouth daily.     levothyroxine (SYNTHROID, LEVOTHROID) 50 MCG tablet Take 1 tablet (50 mcg total) by mouth daily before breakfast. Qty: 30 tablet, Refills: 0    nitroGLYCERIN (NITROSTAT) 0.4 MG SL tablet Place 1 tablet (0.4 mg total) under the tongue every 5 (five) minutes as needed for chest pain. Qty: 25 tablet, Refills: 3    OVER THE COUNTER MEDICATION Take 1 tablet daily as needed by mouth (allergies). Multi-allergy tablet       STOP taking these medications     benazepril (LOTENSIN) 10 MG tablet      warfarin (COUMADIN) 3 MG tablet        Allergies  Allergen Reactions  . Sulfa Antibiotics Nausea And Vomiting      The results of significant diagnostics from this hospitalization (including imaging, microbiology, ancillary and laboratory) are listed below for reference.    Significant Diagnostic Studies: Dg Chest Port 1 View  Result Date: 12/25/2016 CLINICAL DATA:  GI bleed for 1 day. No chest complaints. Weakness this morning. EXAM: PORTABLE CHEST 1 VIEW COMPARISON:  09/01/2016 FINDINGS: Cardiac pacemaker. Heart size and pulmonary vascularity are normal. Calcified granuloma in the right lung apex. No airspace disease or consolidation. No blunting of costophrenic angles. No pneumothorax. Calcification of the aorta. IMPRESSION: No evidence of active pulmonary disease.  Aortic atherosclerosis. Electronically Signed   By: Burman Nieves M.D.   On: 12/25/2016 06:46    Microbiology: No results found for this or any previous visit (from the past 240 hour(s)).   Labs: Basic Metabolic Panel: Recent Labs  Lab 12/25/16 0205 12/26/16 0232  NA  131* 131*  K 3.2* 2.8*  CL 91* 98*  CO2 29 25  GLUCOSE 96 86  BUN 7 5*  CREATININE 1.42* 1.04  CALCIUM 8.3* 7.0*   Liver Function Tests: Recent Labs  Lab 12/25/16 0205  AST 70*  ALT 29  ALKPHOS 72  BILITOT 0.9  PROT 6.0*  ALBUMIN 3.0*   No results for input(s): LIPASE, AMYLASE in the last 168 hours. No results for input(s): AMMONIA in the last 168 hours. CBC: Recent Labs  Lab 12/25/16 0205 12/25/16 0950 12/25/16 1507 12/25/16 2007 12/26/16 0232  WBC 5.5  --   --   --  2.4*  NEUTROABS 3.5  --   --   --   --   HGB 10.7* 8.9* 8.2* 8.5* 8.2*  HCT 31.2* 25.5* 24.0* 24.6* 24.0*  MCV 94.5  --   --   --  96.0  PLT 85*  --   --   --  61*   Cardiac Enzymes: No results for input(s): CKTOTAL, CKMB, CKMBINDEX, TROPONINI in the last 168 hours. BNP: BNP (last 3 results) Recent Labs    09/01/16 1234  BNP 67.5    ProBNP (last 3 results) No results for input(s): PROBNP in the last 8760 hours.  CBG: No results for input(s): GLUCAP in the last 168 hours.     Signed:  Penny Pia MD.  [facility] 12/27/2016, 11:02 AM

## 2016-12-27 NOTE — Progress Notes (Signed)
Patient stated that he has to be discharged by 0900. I informed him that the MD has to discharge him and leave instructions and prescriptions if needed. Patient replied that he will leave and this can be mailed to him. I informed him if he leaves without MD discharging him this would be considered AMA may cause some issues with insurance payment.I informed him that as staff we can pack up his belongings but that's about all we can do without MD order. Patient stated that he understood. Left note in front of chart that patient is requesting to leave by 0900. Ilean Skill LPN

## 2016-12-27 NOTE — Progress Notes (Signed)
Patient stated the TED hose were uncomfortable and that he had them removed. Large and xlarge were were obtained and brought in room but patient stated that he did not want to wear them and he was going home anyway. Ilean Skill LPN

## 2016-12-27 NOTE — Progress Notes (Signed)
Subjective: No abdominal pain. Bleeding has resolved.  Objective: Vital signs in last 24 hours: Temp:  [98.4 F (36.9 C)-98.9 F (37.2 C)] 98.9 F (37.2 C) (11/10 0515) Pulse Rate:  [64-71] 68 (11/10 0515) Resp:  [18] 18 (11/10 0515) BP: (94-115)/(73-78) 115/75 (11/10 0515) SpO2:  [98 %-99 %] 98 % (11/10 0515) Weight change:  Last BM Date: 12/25/16  PE: GEN:  NAD SKIN:  Scattered ecchymoses, pale  Lab Results: CBC    Component Value Date/Time   WBC 2.4 (L) 12/26/2016 0232   RBC 2.50 (L) 12/26/2016 0232   HGB 8.2 (L) 12/26/2016 0232   HGB 13.4 02/14/2014 0916   HCT 24.0 (L) 12/26/2016 0232   HCT 41.1 02/14/2014 0916   PLT 61 (L) 12/26/2016 0232   PLT 165 02/14/2014 0916   MCV 96.0 12/26/2016 0232   MCV 89.7 02/14/2014 0916   MCH 32.8 12/26/2016 0232   MCHC 34.2 12/26/2016 0232   RDW 14.5 12/26/2016 0232   RDW 17.1 (H) 02/14/2014 0916   LYMPHSABS 1.3 12/25/2016 0205   LYMPHSABS 1.5 02/14/2014 0916   MONOABS 0.6 12/25/2016 0205   MONOABS 0.7 02/14/2014 0916   EOSABS 0.1 12/25/2016 0205   EOSABS 0.1 02/14/2014 0916   BASOSABS 0.0 12/25/2016 0205   BASOSABS 0.1 02/14/2014 0916   CMP     Component Value Date/Time   NA 131 (L) 12/26/2016 0232   NA 138 02/14/2014 0916   K 2.8 (L) 12/26/2016 0232   K 3.7 02/14/2014 0916   CL 98 (L) 12/26/2016 0232   CO2 25 12/26/2016 0232   CO2 31 (H) 02/14/2014 0916   GLUCOSE 86 12/26/2016 0232   GLUCOSE 77 02/14/2014 0916   BUN 5 (L) 12/26/2016 0232   BUN 8.2 02/14/2014 0916   CREATININE 1.04 12/26/2016 0232   CREATININE 0.8 02/14/2014 0916   CALCIUM 7.0 (L) 12/26/2016 0232   CALCIUM 8.6 02/14/2014 0916   PROT 6.0 (L) 12/25/2016 0205   PROT 6.3 (L) 09/28/2013 1340   ALBUMIN 3.0 (L) 12/25/2016 0205   ALBUMIN 3.0 (L) 09/28/2013 1340   AST 70 (H) 12/25/2016 0205   AST 24 09/28/2013 1340   ALT 29 12/25/2016 0205   ALT 12 09/28/2013 1340   ALKPHOS 72 12/25/2016 0205   ALKPHOS 72 09/28/2013 1340   BILITOT 0.9 12/25/2016  0205   BILITOT 0.49 09/28/2013 1340   GFRNONAA >60 12/26/2016 0232   GFRNONAA 85 02/13/2014 1401   GFRAA >60 12/26/2016 0232   GFRAA >89 02/13/2014 1401   Assessment:  1.  Hematochezia, resolved. 2.  Acute blood loss anemia.  Plan:  1.  Hold anticoagulation for the next 4 more days if clinically feasible from cardiac perspective. 2.  Miralax 17 grams po qd upon discharge to avoid constipation/straining. 3.  Continue topical therapy (Anusol-HC cream) for hemorrhoids for next 10 days, then prn thereafter. 4.  Advance diet. 5.  OK for patient to d/c home from GI perspective; we will arrange outpatient follow-up with patient to see Dr. Evette Cristal (his primary gastroenterologist) as outpatient.  [facility] will sign-off; please call with questions; thank you for the consultation.    Don [name] 12/27/2016, 12:06 PM   Cell [phone] If no answer or after 5 PM call [phone]

## 2017-01-01 ENCOUNTER — Telehealth: Payer: Self-pay | Admitting: Cardiology

## 2017-01-01 NOTE — Telephone Encounter (Signed)
LMOVM reminding pt to send remote transmission.   

## 2017-02-03 NOTE — Progress Notes (Signed)
No ICM remote transmission received for 02/03/2017 and next ICM transmission scheduled for 03/03/2017.

## 2017-03-03 ENCOUNTER — Ambulatory Visit (INDEPENDENT_AMBULATORY_CARE_PROVIDER_SITE_OTHER): Payer: PPO

## 2017-03-03 ENCOUNTER — Telehealth: Payer: Self-pay

## 2017-03-03 ENCOUNTER — Telehealth: Payer: Self-pay | Admitting: Cardiology

## 2017-03-03 DIAGNOSIS — I5022 Chronic systolic (congestive) heart failure: Secondary | ICD-10-CM | POA: Diagnosis not present

## 2017-03-03 DIAGNOSIS — Z95810 Presence of automatic (implantable) cardiac defibrillator: Secondary | ICD-10-CM | POA: Diagnosis not present

## 2017-03-03 NOTE — Progress Notes (Signed)
EPIC Encounter for ICM Monitoring  Patient Name: Don [name] is a 72 y.o. male Date: 03/03/2017 Primary Care Physican: Smothers, Andree Elk, NP Primary Cardiologist:Taylor Electrophysiologist: Druscilla Brownie Weight:Does not weigh Bi-V Pacing: 90.3%          Heart Failure questions reviewed, pt asymptomatic for fluid symptoms.  Patient reported being depressed and sleeps most of the time.  Patient says he has been drinking a lot of alcohol but does not drink every day.   Asked if he needed a referral to a professional he can talk to about the depression and alcohol.  He said he has plenty of people he can talk to.     Thoracic impedance abnormal suggesting fluid accumulation since 12/23/2016.  Prescribed dosage: Furosemide 20 mg 1 tablet daily.   MED COMPLIANCE:  Stopped taking Eliquis 6-8 weeks ago and in its place took old Warfarin tablets he had left from previous prescription for a couple of weeks. He has been with out anticoagulants within the last 2-3 weeks.  Explained the importance the medication and advised I would inform Dr Lovena Le he is out of Eliquis.  LABS: 12/26/2016   Creatinine 1.04, BUN 5,   Potassium 2.8, Sodium 131, EGFR >60 12/25/2016   Creatinine 1.42, BUN 7,   Potassium 3.2, Sodium 131, EGFR >60 0716/2018  Creatinine 1.01, BUN 5,   Potassium 3.7, Sodium 128, EGFR >60 01/20/2016 Creatinine 0.92, BUN 15, Potassium 3.9, Sodium 141, EGFR >60 12/28/2015 Creatinine 0.90, BUN 21, Potassium 3.7, Sodium 140 10/15/2015 Creatinine 0.83, BUN 10, Potassium 4.4, Sodium 141  10/14/2015 Creatinine 1.25, BUN 16, Potassium 3.8, Sodium 143 10/13/2015 Creatinine 3.54, BUN 24, Potassium 3.1, Sodium 140 10/12/2015 Creatinine 4.75, BUN 27, Potassium 3.1, Sodium 139 10/12/2015 Creatinine 6.39, BUN 31, Potassium 2.8, Sodium 137 09/20/2015 Creatinine 0.66, BUN <5, Potassium 4.1, Sodium 139  Recommendations:  Message sent to Dr Tanna Furry scheduler to call patient for an appointment as  soon as possible.  Phone message sent to Dr Lovena Le informing him patient is not taking Eliquis.    Follow-up plan: ICM clinic phone appointment on 03/12/2017 to recheck fluid levels.  Over due to make office appointment with Dr Lovena Le (last visit was 10/29/2015)  Copy of ICM check sent to Dr. Lovena Le for review and recommendations if needed.   3 month ICM trend: 03/03/2017    1 Year ICM trend:       Rosalene Billings, RN 03/03/2017 2:58 PM

## 2017-03-03 NOTE — Telephone Encounter (Signed)
Spoke with pt and reminded pt of remote transmission that is due today. Pt verbalized understanding.   

## 2017-03-03 NOTE — Telephone Encounter (Signed)
ICM call to patient.  Patient reported stopping Eliquis 6-8 weeks ago due to unable to afford copay.  After stopping Eliquis he had some Warfarin pills left from previous prescription and took that a couple of weeks until he ran of the medication.  He has not taking any anticoagulant in the last 2-3 weeks.  Eliquis is not currently on med list.  Last office visit with Dr Ladona Ridgel was 10/2015.  Message sent to Dr Lubertha Basque scheduler to call patient for appointment as as possible.  Patient reported major depression and drinking excessive amount of alcohol but said he does not drink daily.  Offered to obtain referral to psychologist or SW but he declined.  Advised would inform Dr Ladona Ridgel in regards he stopped taking Eliquis about 6-8 weeks ago.

## 2017-03-03 NOTE — Telephone Encounter (Signed)
I would suggest restarting the eliquis now that we have a new year and should be out of donut hole. GT

## 2017-03-05 MED ORDER — APIXABAN 5 MG PO TABS: 5.0000 mg | ORAL_TABLET | Freq: Two times a day (BID) | ORAL | 11 refills | Status: AC

## 2017-03-05 NOTE — Telephone Encounter (Signed)
Notified Pt of Dr. Lubertha Basque recommendation.  Eliquis prescription sent to [facility].  Pt indicates understanding.  Verified appt with Dr. Ladona Ridgel 03/17/2017 @ 3:00 pm.  No further action at this time.

## 2017-03-12 ENCOUNTER — Ambulatory Visit (INDEPENDENT_AMBULATORY_CARE_PROVIDER_SITE_OTHER): Payer: PPO

## 2017-03-12 DIAGNOSIS — I5022 Chronic systolic (congestive) heart failure: Secondary | ICD-10-CM

## 2017-03-12 DIAGNOSIS — Z95810 Presence of automatic (implantable) cardiac defibrillator: Secondary | ICD-10-CM

## 2017-03-12 NOTE — Progress Notes (Signed)
EPIC Encounter for ICM Monitoring  Patient Name: Don [name] is a 72 y.o. male Date: 03/12/2017 Primary Care Physican: Smothers, Andree Elk, NP Primary Cardiologist:Taylor Electrophysiologist: Druscilla Brownie Weight:Does not weigh Bi-V Pacing: 90.2%      Heart Failure questions reviewed, pt asymptomatic.   Thoracic impedance is almost back at normal baseline.   Prescribed dosage: Furosemide 20 mg 1 tablet daily.   LABS: 12/26/2016   Creatinine 1.04, BUN 5,   Potassium 2.8, Sodium 131, EGFR >60 12/25/2016   Creatinine 1.42, BUN 7,   Potassium 3.2, Sodium 131, EGFR >60 0716/2018  Creatinine 1.01, BUN 5,   Potassium 3.7, Sodium 128, EGFR >60 01/20/2016 Creatinine 0.92, BUN 15, Potassium 3.9, Sodium 141, EGFR >60 12/28/2015 Creatinine 0.90, BUN 21, Potassium 3.7, Sodium 140 10/15/2015 Creatinine 0.83, BUN 10, Potassium 4.4, Sodium 141  10/14/2015 Creatinine 1.25, BUN 16, Potassium 3.8, Sodium 143 10/13/2015 Creatinine 3.54, BUN 24, Potassium 3.1, Sodium 140 10/12/2015 Creatinine 4.75, BUN 27, Potassium 3.1, Sodium 139 10/12/2015 Creatinine 6.39, BUN 31, Potassium 2.8, Sodium 137 09/20/2015 Creatinine 0.66, BUN <5, Potassium 4.1, Sodium 139  Recommendations: No changes.    Encouraged to call for fluid symptoms.  Follow-up plan: ICM clinic phone appointment on 04/21/2017.  Office appointment scheduled 03/17/2017 with Dr. Lovena Le.  Copy of ICM check sent to Dr. Lovena Le.   3 month ICM trend: 03/12/2017    1 Year ICM trend:       Rosalene Billings, RN 03/12/2017 10:30 AM

## 2017-03-17 ENCOUNTER — Encounter: Payer: Self-pay | Admitting: *Deleted

## 2017-03-17 ENCOUNTER — Ambulatory Visit: Payer: PPO | Admitting: Internal Medicine

## 2017-03-17 ENCOUNTER — Encounter: Payer: Self-pay | Admitting: Internal Medicine

## 2017-03-17 VITALS — BP 92/64 | HR 85 | Ht 72.0 in | Wt 203.0 lb

## 2017-03-17 DIAGNOSIS — I472 Ventricular tachycardia, unspecified: Secondary | ICD-10-CM

## 2017-03-17 DIAGNOSIS — M791 Myalgia, unspecified site: Secondary | ICD-10-CM | POA: Diagnosis not present

## 2017-03-17 DIAGNOSIS — I4891 Unspecified atrial fibrillation: Secondary | ICD-10-CM

## 2017-03-17 DIAGNOSIS — I5022 Chronic systolic (congestive) heart failure: Secondary | ICD-10-CM

## 2017-03-17 DIAGNOSIS — Z95810 Presence of automatic (implantable) cardiac defibrillator: Secondary | ICD-10-CM

## 2017-03-17 NOTE — Patient Instructions (Addendum)
Medication Instructions:  Your physician recommends that you continue on your current medications as directed. Please refer to the Current Medication list given to you today.  Labwork: You will get lab work today:  TSH, liver panel and CK.  Testing/Procedures: None ordered.  Follow-Up: Your physician wants you to follow-up in: one year with Dr. Ladona Ridgel.   You will receive a reminder letter in the mail two months in advance. If you don't receive a letter, please call our office to schedule the follow-up appointment.  Remote monitoring is used to monitor your ICD from home. This monitoring reduces the number of office visits required to check your device to one time per year. It allows [place] to keep an eye on the functioning of your device to ensure it is working properly. You are scheduled for a device check from home on 04/21/2017. You may send your transmission at any time that day. If you have a wireless device, the transmission will be sent automatically. After your physician reviews your transmission, you will receive a postcard with your next transmission date.   Any Other Special Instructions Will Be Listed Below (If Applicable).  If you need a refill on your cardiac medications before your next appointment, please call your pharmacy.

## 2017-03-17 NOTE — Progress Notes (Signed)
HPI [name] returns today for ongoing evaluation and management. He has a h/o non-ischemic CM, chronic systolic heart failure with normalization of his LV function after Biv ICD insertion. Despite this he feels poorly. He c/o having severe weakness and fatigue and muscle aches. He has had a remote stroke and is on Eliquis. He has had a h/o GI bleeding and has been underevaluation. He has had a h/o VT/VF and appropriate ICD shocks. Allergies  Allergen Reactions  . Other     Sport and exercise psychologist  . Sulfa Antibiotics Nausea And Vomiting  . Sulfasalazine Nausea And Vomiting     Current Outpatient Medications  Medication Sig Dispense Refill  . allopurinol (ZYLOPRIM) 100 MG tablet Take 100 mg by mouth daily.     Marland Kitchen apixaban (ELIQUIS) 5 MG TABS tablet Take 1 tablet (5 mg total) by mouth 2 (two) times daily. 60 tablet 11  . atorvastatin (LIPITOR) 40 MG tablet Take 40 mg by mouth at bedtime.     . carvedilol (COREG) 12.5 MG tablet Take 6.25 mg by mouth 2 (two) times daily with a meal.    . furosemide (LASIX) 20 MG tablet Take 20 mg by mouth daily.     Marland Kitchen levothyroxine (SYNTHROID, LEVOTHROID) 50 MCG tablet Take 1 tablet (50 mcg total) by mouth daily before breakfast. 30 tablet 0  . nitroGLYCERIN (NITROSTAT) 0.4 MG SL tablet Place 1 tablet (0.4 mg total) under the tongue every 5 (five) minutes as needed for chest pain. (Patient not taking: Reported on 03/17/2017) 25 tablet 3   No current facility-administered medications for this visit.      Past Medical History:  Diagnosis Date  . Alcohol abuse    PREVIOUS H/O DRINKING A FIFTH OF VODKA  A DAY  . Anginal pain (HCC)   . Arthritis    hands & back  . CHF (congestive heart failure) (HCC)   . Chronic kidney disease    acute renal  04/2012  . Chronic systolic heart failure (HCC)    LEFT  . Coronary artery disease   . History of angina   . Hypertension   . ICD (implantable cardiac defibrillator) in place   . LBBB (left bundle branch  block)   . Nonischemic cardiomyopathy (HCC)    EF 20% s/p INSERTION OF BI-V DEFIBRILLATOR.  . Stroke (HCC)    TIA's  . Thyroid disease    HYPOTHYROIDISM.....TREATED  . TIA (transient ischemic attack)     ROS:   All systems reviewed and negative except as noted in the HPI.   Past Surgical History:  Procedure Laterality Date  . CARDIAC CATHETERIZATION    . CYSTOSCOPY W/ STONE MANIPULATION    . FLEXIBLE SIGMOIDOSCOPY N/A 04/29/2012   Procedure: FLEXIBLE SIGMOIDOSCOPY;  Surgeon: Vertell Novak., MD;  Location: [facility] ENDOSCOPY;  Service: Endoscopy;  Laterality: N/A;  . IMPLANTABLE CARDIOVERTER DEFIBRILLATOR (ICD) GENERATOR CHANGE Bilateral 12/25/2011   Procedure: ICD GENERATOR CHANGE;  Surgeon: Marinus Maw, MD;  Location: [facility] CATH LAB;  Service: Cardiovascular;  Laterality: Bilateral;  . INSERT / REPLACE / REMOVE PACEMAKER     BI-V IMPLANTABLE CARDIOVERTER-DEFIBRILLATOR. ; [employer]; MODEL  # [id number], SERIAL # [id number] H.  DR. Humberto Leep. [name].  . TONSILLECTOMY    . VASECTOMY       Family History  Problem Relation Age of Onset  . Heart disease Mother   . Heart disease Father   . Hypertension Unknown      Social History  Socioeconomic History  . Marital status: Widowed    Spouse name: Not on file  . Number of children: Not on file  . Years of education: Not on file  . Highest education level: Not on file  Social Needs  . Financial resource strain: Not on file  . Food insecurity - worry: Not on file  . Food insecurity - inability: Not on file  . Transportation needs - medical: Not on file  . Transportation needs - non-medical: Not on file  Occupational History  . Occupation: RETIRED    Comment: SOCIAL WORKER  Tobacco Use  . Smoking status: Current Every Day Smoker    Packs/day: 1.00    Years: 43.00    Pack years: 43.00    Last attempt to quit: 02/27/2012    Years since quitting: 5.0  . Smokeless tobacco: Never Used  Substance and Sexual Activity  .  Alcohol use: No    Alcohol/week: 0.0 oz    Comment: H/O ALCOHOLISM PERVIOUSLY A FIFTH OF VODKA A DAY  . Drug use: No  . Sexual activity: Not Currently  Other Topics Concern  . Not on file  Social History Narrative   BI-V IMPLANTABLE CARDIOVERTER-DEFIBRILLATOR; [employer], MODEL # [id number]; SERIAL # [id number] H;       RETIRED SOCIAL WORKER      MARRIED 4 TIMES      H/O ALCOHOLISM; A FIFTH OF VODKA DAILY IN THE PAST              BP 92/64   Pulse 85   Ht 6' (1.829 m)   Wt 203 lb (92.1 kg) Comment: with coat on  SpO2 98%   BMI 27.53 kg/m   Physical Exam:  Well appearing NAD HEENT: Unremarkable Neck:  No JVD, no thyromegally Lymphatics:  No adenopathy Back:  No CVA tenderness Lungs:  Clear HEART:  Regular rate rhythm, no murmurs, no rubs, no clicks Abd:  soft, positive bowel sounds, no organomegally, no rebound, no guarding Ext:  2 plus pulses, no edema, no cyanosis, no clubbing Skin:  No rashes no nodules Neuro:  CN II through XII intact, motor grossly intact   DEVICE  Normal device function.  See PaceArt for details.   Assess/Plan: 1. Chronic systolic heart failure - he has had normalization of his LV function by echo although he does not feel well. Will look for other causes of weakness and fatigue. 2. Severe fatigue and weakness - will check TSH and CPK and liver panel today. 3. Stroke - he has had no recurrence. I suspect his residual is partly resulting in #2. 4. GI bleeding - he has had GI bleeding on warfarin and was stopped and switched to Eliquis. No bleeding since then. He will undergo watchful waiting.  Leonia Reeves.D.

## 2017-03-18 ENCOUNTER — Telehealth: Payer: Self-pay

## 2017-03-18 LAB — HEPATIC FUNCTION PANEL
ALT: 18 IU/L (ref 0–44)
AST: 47 IU/L — AB (ref 0–40)
Albumin: 3.6 g/dL (ref 3.5–4.8)
Alkaline Phosphatase: 75 IU/L (ref 39–117)
Bilirubin Total: 0.7 mg/dL (ref 0.0–1.2)
Bilirubin, Direct: 0.46 mg/dL — ABNORMAL HIGH (ref 0.00–0.40)
TOTAL PROTEIN: 6.3 g/dL (ref 6.0–8.5)

## 2017-03-18 LAB — CK TOTAL AND CKMB (NOT AT ARMC)
CK-MB Index: 1.7 ng/mL (ref 0.0–10.4)
Total CK: 43 U/L (ref 24–204)

## 2017-03-18 LAB — TSH: TSH: 184 u[IU]/mL — ABNORMAL HIGH (ref 0.450–4.500)

## 2017-03-18 NOTE — Telephone Encounter (Signed)
Dr. Ladona Ridgel notified of Pt TSH results.  Per Pt, he is taking his Synthroid 50 mcg daily.  Per Dr. Hortencia Conradi he is taking his prescription send results to PCP to adjust dosage. Will call PCP D. Smothers for further Synthroid adjustments.  Call placed to [facility].  Dr. Glenetta Borg no longer with practice.  Made appt for Pt with Dr. Leavy Cella March 20, 2017 @ 8:45 am for abnormal TSH.  Routed abnormal TSH to Dr. Leavy Cella. Call placed to Pt.  Notified of appt.  Pt will keep appt.  Thanked nurse for call.

## 2017-03-19 ENCOUNTER — Encounter (HOSPITAL_COMMUNITY): Payer: Self-pay | Admitting: Internal Medicine

## 2017-03-19 ENCOUNTER — Emergency Department (HOSPITAL_COMMUNITY): Payer: PPO

## 2017-03-19 ENCOUNTER — Inpatient Hospital Stay (HOSPITAL_COMMUNITY)
Admission: EM | Admit: 2017-03-19 | Discharge: 2017-03-21 | DRG: 948 | Disposition: A | Discharge status: Home Health | Payer: PPO | Attending: Internal Medicine | Admitting: Internal Medicine

## 2017-03-19 DIAGNOSIS — R296 Repeated falls: Secondary | ICD-10-CM | POA: Diagnosis present

## 2017-03-19 DIAGNOSIS — Z66 Do not resuscitate: Secondary | ICD-10-CM | POA: Diagnosis present

## 2017-03-19 DIAGNOSIS — Z923 Personal history of irradiation: Secondary | ICD-10-CM

## 2017-03-19 DIAGNOSIS — M479 Spondylosis, unspecified: Secondary | ICD-10-CM | POA: Diagnosis present

## 2017-03-19 DIAGNOSIS — I951 Orthostatic hypotension: Secondary | ICD-10-CM | POA: Diagnosis present

## 2017-03-19 DIAGNOSIS — Z8673 Personal history of transient ischemic attack (TIA), and cerebral infarction without residual deficits: Secondary | ICD-10-CM | POA: Diagnosis not present

## 2017-03-19 DIAGNOSIS — I428 Other cardiomyopathies: Secondary | ICD-10-CM | POA: Diagnosis not present

## 2017-03-19 DIAGNOSIS — M546 Pain in thoracic spine: Secondary | ICD-10-CM | POA: Diagnosis not present

## 2017-03-19 DIAGNOSIS — E039 Hypothyroidism, unspecified: Secondary | ICD-10-CM | POA: Diagnosis not present

## 2017-03-19 DIAGNOSIS — M19042 Primary osteoarthritis, left hand: Secondary | ICD-10-CM | POA: Diagnosis present

## 2017-03-19 DIAGNOSIS — N189 Chronic kidney disease, unspecified: Secondary | ICD-10-CM | POA: Diagnosis present

## 2017-03-19 DIAGNOSIS — Z7901 Long term (current) use of anticoagulants: Secondary | ICD-10-CM

## 2017-03-19 DIAGNOSIS — E890 Postprocedural hypothyroidism: Secondary | ICD-10-CM | POA: Diagnosis present

## 2017-03-19 DIAGNOSIS — R404 Transient alteration of awareness: Secondary | ICD-10-CM | POA: Diagnosis not present

## 2017-03-19 DIAGNOSIS — R531 Weakness: Secondary | ICD-10-CM | POA: Diagnosis not present

## 2017-03-19 DIAGNOSIS — F17210 Nicotine dependence, cigarettes, uncomplicated: Secondary | ICD-10-CM | POA: Diagnosis not present

## 2017-03-19 DIAGNOSIS — F1010 Alcohol abuse, uncomplicated: Secondary | ICD-10-CM | POA: Diagnosis present

## 2017-03-19 DIAGNOSIS — E860 Dehydration: Secondary | ICD-10-CM | POA: Diagnosis present

## 2017-03-19 DIAGNOSIS — M19041 Primary osteoarthritis, right hand: Secondary | ICD-10-CM | POA: Diagnosis not present

## 2017-03-19 DIAGNOSIS — Z8249 Family history of ischemic heart disease and other diseases of the circulatory system: Secondary | ICD-10-CM | POA: Diagnosis not present

## 2017-03-19 DIAGNOSIS — Z7982 Long term (current) use of aspirin: Secondary | ICD-10-CM | POA: Diagnosis not present

## 2017-03-19 DIAGNOSIS — R55 Syncope and collapse: Secondary | ICD-10-CM | POA: Diagnosis not present

## 2017-03-19 DIAGNOSIS — I447 Left bundle-branch block, unspecified: Secondary | ICD-10-CM | POA: Diagnosis present

## 2017-03-19 DIAGNOSIS — I5042 Chronic combined systolic (congestive) and diastolic (congestive) heart failure: Secondary | ICD-10-CM | POA: Diagnosis not present

## 2017-03-19 DIAGNOSIS — E785 Hyperlipidemia, unspecified: Secondary | ICD-10-CM | POA: Diagnosis not present

## 2017-03-19 DIAGNOSIS — Z79890 Hormone replacement therapy (postmenopausal): Secondary | ICD-10-CM | POA: Diagnosis not present

## 2017-03-19 DIAGNOSIS — E876 Hypokalemia: Secondary | ICD-10-CM | POA: Diagnosis not present

## 2017-03-19 DIAGNOSIS — K649 Unspecified hemorrhoids: Secondary | ICD-10-CM | POA: Diagnosis present

## 2017-03-19 DIAGNOSIS — Z882 Allergy status to sulfonamides status: Secondary | ICD-10-CM

## 2017-03-19 DIAGNOSIS — Z95810 Presence of automatic (implantable) cardiac defibrillator: Secondary | ICD-10-CM

## 2017-03-19 DIAGNOSIS — I130 Hypertensive heart and chronic kidney disease with heart failure and stage 1 through stage 4 chronic kidney disease, or unspecified chronic kidney disease: Secondary | ICD-10-CM | POA: Diagnosis not present

## 2017-03-19 DIAGNOSIS — I2510 Atherosclerotic heart disease of native coronary artery without angina pectoris: Secondary | ICD-10-CM | POA: Diagnosis not present

## 2017-03-19 DIAGNOSIS — M545 Low back pain: Secondary | ICD-10-CM | POA: Diagnosis not present

## 2017-03-19 DIAGNOSIS — M549 Dorsalgia, unspecified: Secondary | ICD-10-CM | POA: Diagnosis not present

## 2017-03-19 HISTORY — DX: Chronic combined systolic (congestive) and diastolic (congestive) heart failure: I50.42

## 2017-03-19 HISTORY — DX: Personal history of urinary calculi: Z87.442

## 2017-03-19 LAB — URINALYSIS, ROUTINE W REFLEX MICROSCOPIC
Glucose, UA: NEGATIVE mg/dL
Hgb urine dipstick: NEGATIVE
Ketones, ur: 5 mg/dL — AB
LEUKOCYTES UA: NEGATIVE
NITRITE: NEGATIVE
PH: 5 (ref 5.0–8.0)
Protein, ur: NEGATIVE mg/dL
Specific Gravity, Urine: 1.018 (ref 1.005–1.030)

## 2017-03-19 LAB — COMPREHENSIVE METABOLIC PANEL
ALBUMIN: 3.3 g/dL — AB (ref 3.5–5.0)
ALT: 22 U/L (ref 17–63)
ANION GAP: 20 — AB (ref 5–15)
AST: 56 U/L — AB (ref 15–41)
Alkaline Phosphatase: 73 U/L (ref 38–126)
BUN: 7 mg/dL (ref 6–20)
CHLORIDE: 89 mmol/L — AB (ref 101–111)
CO2: 29 mmol/L (ref 22–32)
Calcium: 8.1 mg/dL — ABNORMAL LOW (ref 8.9–10.3)
Creatinine, Ser: 1.45 mg/dL — ABNORMAL HIGH (ref 0.61–1.24)
GFR calc Af Amer: 54 mL/min — ABNORMAL LOW (ref >60)
GFR calc non Af Amer: 47 mL/min — ABNORMAL LOW (ref >60)
GLUCOSE: 89 mg/dL (ref 65–99)
POTASSIUM: 3.3 mmol/L — AB (ref 3.5–5.1)
SODIUM: 138 mmol/L (ref 135–145)
Total Bilirubin: 1.7 mg/dL — ABNORMAL HIGH (ref 0.3–1.2)
Total Protein: 6.5 g/dL (ref 6.5–8.1)

## 2017-03-19 LAB — CBC WITH DIFFERENTIAL/PLATELET
BASOS ABS: 0 10*3/uL (ref 0.0–0.1)
BASOS PCT: 0 %
Eosinophils Absolute: 0 10*3/uL (ref 0.0–0.7)
Eosinophils Relative: 0 %
HCT: 35.7 % — ABNORMAL LOW (ref 39.0–52.0)
HEMOGLOBIN: 12.7 g/dL — AB (ref 13.0–17.0)
LYMPHS ABS: 1 10*3/uL (ref 0.7–4.0)
Lymphocytes Relative: 12 %
MCH: 34.6 pg — AB (ref 26.0–34.0)
MCHC: 35.6 g/dL (ref 30.0–36.0)
MCV: 97.3 fL (ref 78.0–100.0)
Monocytes Absolute: 0.8 10*3/uL (ref 0.1–1.0)
Monocytes Relative: 10 %
NEUTROS PCT: 78 %
Neutro Abs: 6.2 10*3/uL (ref 1.7–7.7)
Platelets: 147 10*3/uL — ABNORMAL LOW (ref 150–400)
RBC: 3.67 MIL/uL — AB (ref 4.22–5.81)
RDW: 15 % (ref 11.5–15.5)
WBC: 8.1 10*3/uL (ref 4.0–10.5)

## 2017-03-19 LAB — I-STAT CHEM 8, ED
BUN: 7 mg/dL (ref 6–20)
Calcium, Ion: 0.84 mmol/L — CL (ref 1.15–1.40)
Chloride: 94 mmol/L — ABNORMAL LOW (ref 101–111)
Creatinine, Ser: 1.1 mg/dL (ref 0.61–1.24)
Glucose, Bld: 72 mg/dL (ref 65–99)
HEMATOCRIT: 31 % — AB (ref 39.0–52.0)
Hemoglobin: 10.5 g/dL — ABNORMAL LOW (ref 13.0–17.0)
Potassium: 2.8 mmol/L — ABNORMAL LOW (ref 3.5–5.1)
SODIUM: 138 mmol/L (ref 135–145)
TCO2: 28 mmol/L (ref 22–32)

## 2017-03-19 LAB — T4, FREE: FREE T4: 0.93 ng/dL (ref 0.61–1.12)

## 2017-03-19 LAB — ETHANOL: Alcohol, Ethyl (B): <10 mg/dL (ref <10)

## 2017-03-19 LAB — TSH: TSH: 126.664 u[IU]/mL — AB (ref 0.350–4.500)

## 2017-03-19 LAB — PHOSPHORUS: PHOSPHORUS: 2.3 mg/dL — AB (ref 2.5–4.6)

## 2017-03-19 LAB — TROPONIN I: Troponin I: <0.03 ng/mL (ref <0.03)

## 2017-03-19 LAB — MAGNESIUM: MAGNESIUM: 1.2 mg/dL — AB (ref 1.7–2.4)

## 2017-03-19 LAB — I-STAT CG4 LACTIC ACID, ED: Lactic Acid, Venous: 1.54 mmol/L (ref 0.5–1.9)

## 2017-03-19 MED ORDER — ACETAMINOPHEN 650 MG RE SUPP: 650.0000 mg | Freq: Four times a day (QID) | RECTAL | Status: DC | PRN

## 2017-03-19 MED ORDER — ZOLPIDEM TARTRATE 5 MG PO TABS: 5.0000 mg | ORAL_TABLET | Freq: Every evening | ORAL | Status: DC | PRN

## 2017-03-19 MED ORDER — FOLIC ACID 1 MG PO TABS
1.0000 mg | ORAL_TABLET | Freq: Every day | ORAL | Status: DC
  Administered 2017-03-20 – 2017-03-21 (×2): 1 mg via ORAL
  Filled 2017-03-19 (×2): qty 1

## 2017-03-19 MED ORDER — POTASSIUM CHLORIDE CRYS ER 20 MEQ PO TBCR
40.0000 meq | EXTENDED_RELEASE_TABLET | Freq: Once | ORAL | Status: AC
  Administered 2017-03-19: 40 meq via ORAL
  Filled 2017-03-19: qty 2

## 2017-03-19 MED ORDER — ADULT MULTIVITAMIN W/MINERALS CH
1.0000 | ORAL_TABLET | Freq: Every day | ORAL | Status: DC
  Administered 2017-03-20 – 2017-03-21 (×2): 1 via ORAL
  Filled 2017-03-19 (×2): qty 1

## 2017-03-19 MED ORDER — MAGNESIUM SULFATE 2 GM/50ML IV SOLN
2.0000 g | Freq: Once | INTRAVENOUS | Status: AC
  Administered 2017-03-19: 2 g via INTRAVENOUS
  Filled 2017-03-19: qty 50

## 2017-03-19 MED ORDER — VITAMIN B-1 100 MG PO TABS
100.0000 mg | ORAL_TABLET | Freq: Every day | ORAL | Status: DC
  Administered 2017-03-20 – 2017-03-21 (×2): 100 mg via ORAL
  Filled 2017-03-19 (×2): qty 1

## 2017-03-19 MED ORDER — ACETAMINOPHEN 325 MG PO TABS
650.0000 mg | ORAL_TABLET | Freq: Four times a day (QID) | ORAL | Status: DC | PRN
  Administered 2017-03-20: 650 mg via ORAL
  Filled 2017-03-19: qty 2

## 2017-03-19 MED ORDER — SODIUM CHLORIDE 0.9 % IV BOLUS (SEPSIS): 500.0000 mL | Freq: Once | INTRAVENOUS | Status: DC

## 2017-03-19 MED ORDER — LORAZEPAM 2 MG/ML IJ SOLN: 1.0000 mg | Freq: Four times a day (QID) | INTRAMUSCULAR | Status: DC | PRN

## 2017-03-19 MED ORDER — THIAMINE HCL 100 MG/ML IJ SOLN: 100.0000 mg | Freq: Every day | INTRAMUSCULAR | Status: DC

## 2017-03-19 MED ORDER — K PHOS MONO-SOD PHOS DI & MONO 155-852-130 MG PO TABS
500.0000 mg | ORAL_TABLET | Freq: Once | ORAL | Status: AC
  Administered 2017-03-19: 500 mg via ORAL
  Filled 2017-03-19: qty 2

## 2017-03-19 MED ORDER — SODIUM CHLORIDE 0.9 % IV SOLN
1.0000 g | Freq: Once | INTRAVENOUS | Status: AC
  Administered 2017-03-19: 1 g via INTRAVENOUS
  Filled 2017-03-19: qty 10

## 2017-03-19 MED ORDER — SODIUM CHLORIDE 0.9 % IV BOLUS (SEPSIS)
500.0000 mL | Freq: Once | INTRAVENOUS | Status: AC
  Administered 2017-03-19: 500 mL via INTRAVENOUS

## 2017-03-19 MED ORDER — LEVOTHYROXINE SODIUM 75 MCG PO TABS
75.0000 ug | ORAL_TABLET | Freq: Every day | ORAL | Status: DC
  Administered 2017-03-20: 75 ug via ORAL
  Filled 2017-03-19: qty 1

## 2017-03-19 MED ORDER — SODIUM CHLORIDE 0.9 % IV SOLN
INTRAVENOUS | Status: AC
  Administered 2017-03-19: 22:00:00 via INTRAVENOUS

## 2017-03-19 MED ORDER — ACETAMINOPHEN 325 MG PO TABS
650.0000 mg | ORAL_TABLET | Freq: Once | ORAL | Status: AC
  Administered 2017-03-19: 650 mg via ORAL
  Filled 2017-03-19: qty 2

## 2017-03-19 MED ORDER — SODIUM CHLORIDE 0.9 % IV BOLUS (SEPSIS)
1000.0000 mL | Freq: Once | INTRAVENOUS | Status: AC
  Administered 2017-03-19: 1000 mL via INTRAVENOUS

## 2017-03-19 MED ORDER — POTASSIUM CHLORIDE 10 MEQ/100ML IV SOLN
10.0000 meq | Freq: Once | INTRAVENOUS | Status: AC
  Administered 2017-03-19: 10 meq via INTRAVENOUS
  Filled 2017-03-19: qty 100

## 2017-03-19 MED ORDER — LORAZEPAM 1 MG PO TABS: 1.0000 mg | ORAL_TABLET | Freq: Four times a day (QID) | ORAL | Status: DC | PRN

## 2017-03-19 NOTE — ED Provider Notes (Signed)
[facility] EMERGENCY DEPARTMENT Provider Note   CSN: [id number] Arrival date & time: 03/19/17  1109     History   Chief Complaint Chief Complaint  Patient presents with  . Weakness    HPI Don [name] is a 72 y.o. male.  HPI  72 year old male presents from his doctor's office.  He has been feeling diffusely weak and had less p.o. intake for the last 2 months or more.  He has lost at least 20 pounds during this time.  He denies any vomiting but has 1 or 2 loose stools per day.  He has been having diffuse thoracic and lumbar back pain during this time.  Does not remember an injury.  He has not seen anybody for this.  Denies headache, chest pain, shortness of breath, cough, abdominal pain.  No urinary symptoms.  He denies focal weakness.  He states that he has been getting dizzy when standing.  The weakness is progressive.  He went to his cardiologist's office a couple days ago and they called him because his TSH was very high.  He has been on levothyroxine without changing the dose or missing doses recently.  Was sent to a family practitioner today and then sent here but is not sure why.  He states they asked him if you want to go to the hospital and he said yes because he wants to get this figured out.  Past Medical History:  Diagnosis Date  . Alcohol abuse    PREVIOUS H/O DRINKING A FIFTH OF VODKA  A DAY  . Anginal pain (HCC)   . Arthritis    hands & back  . CHF (congestive heart failure) (HCC)   . Chronic kidney disease    acute renal  04/2012  . Chronic systolic heart failure (HCC)    LEFT  . Coronary artery disease   . History of angina   . Hypertension   . ICD (implantable cardiac defibrillator) in place   . LBBB (left bundle branch block)   . Nonischemic cardiomyopathy (HCC)    EF 20% s/p INSERTION OF BI-V DEFIBRILLATOR.  . Stroke (HCC)    TIA's  . Thyroid disease    HYPOTHYROIDISM.....TREATED  . TIA (transient ischemic attack)      Patient Active Problem List   Diagnosis Date Noted  . GI bleed 12/25/2016  . Encounter for therapeutic drug monitoring 11/19/2015  . Atrial fibrillation (HCC) [I48.91] 11/19/2015  . Nonischemic cardiomyopathy (HCC) 11/01/2015  . Thrombocytopenia (HCC) 10/15/2015  . Sepsis (HCC) 10/12/2015  . Pain in scapula 10/12/2015  . Collagenous colitis 10/12/2015  . Dizziness and giddiness   . History of stroke 09/18/2015  . HLD (hyperlipidemia) 09/18/2015  . Hypothyroid 09/18/2015  . Essential hypertension 09/18/2015  . Tobacco use 09/18/2015  . 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 Hypocalcemia 02/13/2014  . Lymphadenopathy 09/28/2013  . Transient cerebral ischemia 08/16/2012  . Hemorrhoids with complication 04/29/2012  . Acute renal failure (HCC) 04/26/2012  . Hypovolemic shock (HCC) 04/26/2012  . Rectal bleeding 04/26/2012  . Diarrhea 04/26/2012  . Hypokalemia 04/26/2012  . Right rib fracture 04/26/2012  . Fall 04/26/2012  . Dehydration 04/26/2012  . ETOH abuse   . Acute ischemic stroke (HCC)   . 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   . Biventricular implantable cardioverter-defibrillator in situ 07/10/2010  . Chronic combined systolic (congestive) and diastolic (congestive) heart failure (HCC) 07/10/2010  . Ventricular tachycardia (HCC) 07/10/2010    Past Surgical History:  Procedure Laterality Date  . CARDIAC CATHETERIZATION    . CYSTOSCOPY W/ STONE MANIPULATION    . FLEXIBLE SIGMOIDOSCOPY N/A 04/29/2012   Procedure: FLEXIBLE SIGMOIDOSCOPY;  Surgeon: Vertell Novak., MD;  Location: [facility] ENDOSCOPY;  Service: Endoscopy;  Laterality: N/A;  . IMPLANTABLE CARDIOVERTER DEFIBRILLATOR (ICD) GENERATOR CHANGE Bilateral 12/25/2011   Procedure: ICD GENERATOR CHANGE;  Surgeon: Marinus Maw, MD;  Location: [facility] CATH LAB;  Service: Cardiovascular;  Laterality: Bilateral;  . INSERT / REPLACE / REMOVE PACEMAKER     BI-V IMPLANTABLE CARDIOVERTER-DEFIBRILLATOR. ;  [employer]; MODEL  # [id number], SERIAL # [id number] H.  DR. Humberto Leep. EDMUNDS.  . TONSILLECTOMY    . VASECTOMY         Home Medications    Prior to Admission medications   Medication Sig Start Date End Date Taking? Authorizing Provider  allopurinol (ZYLOPRIM) 100 MG tablet Take 100 mg by mouth daily.     [provider]  apixaban (ELIQUIS) 5 MG TABS tablet Take 1 tablet (5 mg total) by mouth 2 (two) times daily. 03/05/17   Marinus Maw, MD  atorvastatin (LIPITOR) 40 MG tablet Take 40 mg by mouth at bedtime.  09/03/11   [provider]  carvedilol (COREG) 12.5 MG tablet Take 6.25 mg by mouth 2 (two) times daily with a meal.    [provider]  furosemide (LASIX) 20 MG tablet Take 20 mg by mouth daily.  10/25/15   [provider]  levothyroxine (SYNTHROID, LEVOTHROID) 50 MCG tablet Take 1 tablet (50 mcg total) by mouth daily before breakfast. 10/15/15   Renae Fickle, MD  nitroGLYCERIN (NITROSTAT) 0.4 MG SL tablet Place 1 tablet (0.4 mg total) under the tongue every 5 (five) minutes as needed for chest pain. Patient not taking: Reported on 03/17/2017 11/19/15   Marinus Maw, MD    Family History Family History  Problem Relation Age of Onset  . Heart disease Mother   . Heart disease Father   . Hypertension Unknown     Social History Social History   Tobacco Use  . Smoking status: Current Every Day Smoker    Packs/day: 1.00    Years: 43.00    Pack years: 43.00    Last attempt to quit: 02/27/2012    Years since quitting: 5.0  . Smokeless tobacco: Never Used  Substance Use Topics  . Alcohol use: No    Alcohol/week: 0.0 oz    Comment: H/O ALCOHOLISM PERVIOUSLY A FIFTH OF VODKA A DAY  . Drug use: No     Allergies   Other; Sulfa antibiotics; and Sulfasalazine   Review of Systems Review of Systems  Constitutional: Negative for fever.  Eyes: Negative for visual disturbance.  Respiratory: Negative for cough and shortness of breath.    Cardiovascular: Negative for chest pain.  Gastrointestinal: Positive for diarrhea. Negative for abdominal pain and vomiting.  Genitourinary: Negative for dysuria.  Musculoskeletal: Positive for back pain. Negative for neck pain.  Neurological: Positive for weakness. Negative for numbness and headaches.  All other systems reviewed and are negative.    Physical Exam Updated Vital Signs BP 101/78   Pulse 75   Temp 98.3 F (36.8 C) (Oral)   Resp 18   SpO2 99%   Physical Exam  Constitutional: He is oriented to person, place, and time. He appears well-developed and well-nourished.  HENT:  Head: Normocephalic. Head is with raccoon's eyes.  Right Ear: External ear normal.  Left Ear: External ear normal.  Nose: Nose  normal.  Mouth/Throat: Mucous membranes are dry.  Eyes: Right eye exhibits no discharge. Left eye exhibits no discharge.  Neck: Neck supple.  Cardiovascular: Normal rate, regular rhythm and normal heart sounds.  Pulmonary/Chest: Effort normal and breath sounds normal.  Abdominal: Soft. There is no tenderness.  Musculoskeletal: He exhibits no edema.       Cervical back: He exhibits no tenderness.       Thoracic back: He exhibits tenderness.       Lumbar back: He exhibits tenderness.  Neurological: He is alert and oriented to person, place, and time.  CN 3-12 grossly intact. 5/5 strength in all 4 extremities. Grossly normal sensation. Normal finger to nose.   Skin: Skin is warm and dry. He is not diaphoretic.  Nursing note and vitals reviewed.    ED Treatments / Results  Labs (all labs ordered are listed, but only abnormal results are displayed) Labs Reviewed  CBC WITH DIFFERENTIAL/PLATELET - Abnormal; Notable for the following components:      Result Value   RBC 3.67 (*)    Hemoglobin 12.7 (*)    HCT 35.7 (*)    MCH 34.6 (*)    Platelets 147 (*)    All other components within normal limits  TSH - Abnormal; Notable for the following components:   TSH 126.664  (*)    All other components within normal limits  COMPREHENSIVE METABOLIC PANEL - Abnormal; Notable for the following components:   Potassium 3.3 (*)    Chloride 89 (*)    Creatinine, Ser 1.45 (*)    Calcium 8.1 (*)    Albumin 3.3 (*)    AST 56 (*)    Total Bilirubin 1.7 (*)    GFR calc non Af Amer 47 (*)    GFR calc Af Amer 54 (*)    Anion gap 20 (*)    All other components within normal limits  I-STAT CHEM 8, ED - Abnormal; Notable for the following components:   Potassium 2.8 (*)    Chloride 94 (*)    Calcium, Ion 0.84 (*)    Hemoglobin 10.5 (*)    HCT 31.0 (*)    All other components within normal limits  T4, FREE  TROPONIN I  URINALYSIS, ROUTINE W REFLEX MICROSCOPIC  MAGNESIUM  I-STAT CG4 LACTIC ACID, ED    EKG  EKG Interpretation  Date/Time:  Thursday March 19 2017 12:53:06 EST Ventricular Rate:  73 PR Interval:    QRS Duration: 100 QT Interval:  486 QTC Calculation: 536 R Axis:   -90 Text Interpretation:  Atrial fibrillation Abnormal R-wave progression, early transition Inferior infarct, old Prolonged QT interval Baseline wander in lead(s) V3 V6 no significant change since Nov 2018 Confirmed by Pricilla Loveless [phone] on 03/19/2017 12:58:18 PM       Radiology Dg Chest 2 View  Result Date: 03/19/2017 CLINICAL DATA:  Weakness.  Orthostasis. EXAM: CHEST  2 VIEW COMPARISON:  12/25/2016 FINDINGS: Artifact overlies the chest. Pacemaker/AICD remains in place. Heart size is normal. Chronic aortic atherosclerosis. Pulmonary vascularity is normal. Lungs are clear except for calcified granuloma in the right upper lobe. Blunting of the posterior costophrenic angles could be consistent with pleural scarring or a small amount of pleural fluid. No significant bone finding. IMPRESSION: Pacemaker/AICD. Aortic atherosclerosis. Posterior costophrenic angle blunting could be due to pleural scarring or small effusions. Electronically Signed   By: Paulina Fusi M.D.   On: 03/19/2017  12:53   Dg Thoracic Spine W/swimmers  Result Date:  03/19/2017 CLINICAL DATA:  Lethargy.  Back pain. EXAM: THORACIC SPINE - 3 VIEWS COMPARISON:  None. FINDINGS: Normal alignment. No fracture. No disc space narrowing. No focal bone finding. Posteromedial ribs are negative. IMPRESSION: Negative. Electronically Signed   By: Paulina Fusi M.D.   On: 03/19/2017 12:55   Dg Lumbar Spine Complete  Result Date: 03/19/2017 CLINICAL DATA:  Lethargy.  Back pain. EXAM: LUMBAR SPINE - COMPLETE 4+ VIEW COMPARISON:  CT 10/12/2015 FINDINGS: Alignment is normal. Mild disc space narrowing L3-4 and L4-5. Mild lower lumbar facet arthritis. No fracture or other focal spinal lesion. Aortoiliac calcification incidentally noted. Mild sacroiliac osteoarthritis and hip osteoarthritis left more than right. 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 Chronic lower lumbar facet arthritis. No fracture or other acute or advanced finding. Aortoiliac atherosclerosis. Sacroiliac osteoarthritis.  Hip osteoarthritis left more than right. Electronically Signed   By: Paulina Fusi M.D.   On: 03/19/2017 12:55    Procedures Procedures (including critical care time)  Medications Ordered in ED Medications  potassium chloride 10 mEq in 100 mL IVPB (not administered)  calcium gluconate 1 g in sodium chloride 0.9 % 100 mL IVPB (not administered)  sodium chloride 0.9 % bolus 500 mL (500 mLs Intravenous New Bag/Given 03/19/17 1248)  acetaminophen (TYLENOL) tablet 650 mg (650 mg Oral Given 03/19/17 1309)  sodium chloride 0.9 % bolus 1,000 mL (0 mLs Intravenous Stopped 03/19/17 1349)  potassium chloride SA (K-DUR,KLOR-CON) CR tablet 40 mEq (40 mEq Oral Given 03/19/17 1423)     Initial Impression / Assessment and Plan / ED Course  I have reviewed the triage vital signs and the nursing notes.  Pertinent labs & imaging results that were available during my care of the patient were reviewed by me and considered in my medical  decision making (see chart for details).     Patient presents with generalized weakness and poor p.o. intake with weight loss.  Likely has also had an elevated TSH.  Comes in with mild bump in creatinine and was given IV fluids.  While his bicarb is only 29 he has an elevated anion gap of 20.  Mild hypokalemia and hypocalcemia.  This was rechecked after fluids and has a normal lactate but still has an anion gap of 21 with a normal bicarbonate.  With some worsening hypokalemia and hypocalcemia, he will need electrolyte replacement and further workup.  Discussed with hospitalist who will admit.  He is otherwise stable.  I did discuss with his PCP and she knows he is being admitted and will likely need close follow-up as well.  Final Clinical Impressions(s) / ED Diagnoses   Final diagnoses:  Thoracic back pain  Hypokalemia  Hypocalcemia    ED Discharge Orders    None       Pricilla Loveless, MD 03/19/17 [phone]

## 2017-03-19 NOTE — H&P (Signed)
History and Physical    Don [name] ZOX:[id number] DOB: 09-Jul-1945 DOA: 03/19/2017  PCP: Lizbeth Bark, NP   Consultants: Dr. Jonna Clark  Patient coming from: Home   Chief Complaint: weakness  HPI: Don [name] is a 72 y.o. male with medical history significant of  CAD, systolic CHF, CKD, TN, hypothyroidism, alcohol abuse, GI bleed on warfarin  - now on Eliquis, CVA presented to the ED from his PCP office d/t significant weakness and malaise. Patient also reported that he has not been eating for days d/t loss of appetite and weakness has been present for few months. Patient was seen by Dr. Ladona Ridgel on 03/17/2017 for ICD routine check up and at that office visit he c/o weakness as well that was felt to be secondary to previous stroke  Today patient went to his PCP per referral of Dr. Ladona Ridgel to address elevated TSH of 184.000, but upon presentation to the doctor's office he was mildly weak, could not move and had significant dizziness so that physician's office insisted on his arrival to the ED for evaluation.  ED Course: Upon arrival to the emergency department and had stable vital signs except orthostatic blood pressure drop from 106/76 lying with heart rate 89, 108/82 sitting with heart rate 83, and 86/76 with heart rate 71 which would be consistent with autonomic dysregulation. Work demonstrated electrolyte abnormalities with potassium 3.3 and calcium 8.1, creatinine was 1.45 and anion gap 20. Patient received IV hydration and IV potassium in the repeat by chemistry revealed potassium 2.8 creatinine 1.1 and ionized calcium was 0.84 His lactic acid was 1.54 TSH was 126.664  Review of Systems: As per HPI; otherwise review of systems reviewed and negative.   Ambulatory Status: Ambulates without assistance, still drives  Past Medical History:  Diagnosis Date  . Alcohol abuse    PREVIOUS H/O DRINKING A FIFTH OF VODKA  A DAY  . Anginal pain (HCC)   . Arthritis    hands & back  . CHF (congestive heart failure) (HCC)   . Chronic kidney disease    acute renal  04/2012  . Chronic systolic heart failure (HCC)    LEFT  . Coronary artery disease   . History of angina   . Hypertension   . ICD (implantable cardiac defibrillator) in place   . LBBB (left bundle branch block)   . Nonischemic cardiomyopathy (HCC)    EF 20% s/p INSERTION OF BI-V DEFIBRILLATOR.  . Stroke (HCC)    TIA's  . Thyroid disease    HYPOTHYROIDISM.....TREATED  . TIA (transient ischemic attack)     Past Surgical History:  Procedure Laterality Date  . CARDIAC CATHETERIZATION    . CYSTOSCOPY W/ STONE MANIPULATION    . FLEXIBLE SIGMOIDOSCOPY N/A 04/29/2012   Procedure: FLEXIBLE SIGMOIDOSCOPY;  Surgeon: Vertell Novak., MD;  Location: [facility] ENDOSCOPY;  Service: Endoscopy;  Laterality: N/A;  . IMPLANTABLE CARDIOVERTER DEFIBRILLATOR (ICD) GENERATOR CHANGE Bilateral 12/25/2011   Procedure: ICD GENERATOR CHANGE;  Surgeon: Marinus Maw, MD;  Location: [facility] CATH LAB;  Service: Cardiovascular;  Laterality: Bilateral;  . INSERT / REPLACE / REMOVE PACEMAKER     BI-V IMPLANTABLE CARDIOVERTER-DEFIBRILLATOR. ; [employer]; MODEL  # [id number], SERIAL # [id number] H.  DR. Humberto Leep. EDMUNDS.  . TONSILLECTOMY    . VASECTOMY      Social History   Socioeconomic History  . Marital status: Widowed    Spouse name: Not on file  . Number of children: Not on file  . Years  of education: Not on file  . Highest education level: Not on file  Social Needs  . Financial resource strain: Not on file  . Food insecurity - worry: Not on file  . Food insecurity - inability: Not on file  . Transportation needs - medical: Not on file  . Transportation needs - non-medical: Not on file  Occupational History  . Occupation: RETIRED    Comment: SOCIAL WORKER  Tobacco Use  . Smoking status: Current Every Day Smoker    Packs/day: 1.00    Years: 43.00    Pack years: 43.00    Last attempt to quit: 02/27/2012     Years since quitting: 5.0  . Smokeless tobacco: Never Used  Substance and Sexual Activity  . Alcohol use: No    Alcohol/week: 0.0 oz    Comment: H/O ALCOHOLISM PERVIOUSLY A FIFTH OF VODKA A DAY  . Drug use: No  . Sexual activity: Not Currently  Other Topics Concern  . Not on file  Social History Narrative   BI-V IMPLANTABLE CARDIOVERTER-DEFIBRILLATOR; [employer], MODEL # [id number]; SERIAL # [id number] H;       RETIRED SOCIAL WORKER      MARRIED 4 TIMES      H/O ALCOHOLISM; A FIFTH OF VODKA DAILY IN THE PAST             Allergies  Allergen Reactions  . Other     Sport and exercise psychologist  . Sulfa Antibiotics Nausea And Vomiting  . Sulfasalazine Nausea And Vomiting    Family History  Problem Relation Age of Onset  . Heart disease Mother   . Heart disease Father   . Hypertension Unknown     Prior to Admission medications   Medication Sig Start Date End Date Taking? Authorizing Provider  allopurinol (ZYLOPRIM) 100 MG tablet Take 100 mg by mouth daily.     [provider]  apixaban (ELIQUIS) 5 MG TABS tablet Take 1 tablet (5 mg total) by mouth 2 (two) times daily. 03/05/17   Marinus Maw, MD  atorvastatin (LIPITOR) 40 MG tablet Take 40 mg by mouth at bedtime.  09/03/11   [provider]  carvedilol (COREG) 12.5 MG tablet Take 6.25 mg by mouth 2 (two) times daily with a meal.    [provider]  furosemide (LASIX) 20 MG tablet Take 20 mg by mouth daily.  10/25/15   [provider]  levothyroxine (SYNTHROID, LEVOTHROID) 50 MCG tablet Take 1 tablet (50 mcg total) by mouth daily before breakfast. 10/15/15   Renae Fickle, MD  nitroGLYCERIN (NITROSTAT) 0.4 MG SL tablet Place 1 tablet (0.4 mg total) under the tongue every 5 (five) minutes as needed for chest pain. Patient not taking: Reported on 03/17/2017 11/19/15   Marinus Maw, MD    Physical Exam: Vitals:   03/19/17 1515 03/19/17 1530 03/19/17 1630 03/19/17 1645  BP: 104/79 101/78 93/72  105/76  Pulse: 71 75 70 74  Resp: 17 18    Temp:      TempSrc:      SpO2: 100% 99% 99% 99%     General: Appears pale and weak, calm and comfortable and is NAD Eyes: PERRL, EOMI, normal lids, iris ENT: grossly normal hearing, lips & tongue, mmm; appropriate dentition Neck: no masses or thyromegaly; no carotid bruits Cardiovascular: RRR, no m/r/g. No LE edema.  Respiratory:  CTA bilaterally with no wheezes/rales/rhonchi.  Normal respiratory effort. Abdomen: soft, NT, ND, NABS Skin:: no rash or induration seen on limited exam Musculoskeletal:  grossly normal tone BUE/BLE, good ROM, no bony abnormality Lower extremity: No LE edema.  Limited foot exam with no ulcerations.  2+ distal pulses. Psychiatric: grossly normal mood and affect, speech fluent and appropriate, AOx3 Neurologic: CN 2-12 grossly intact, moves all extremities in coordinated fashion, sensation intact, right sided facial droop present    Radiological Exams on Admission: Dg Chest 2 View  Result Date: 03/19/2017 CLINICAL DATA:  Weakness.  Orthostasis. EXAM: CHEST  2 VIEW COMPARISON:  12/25/2016 FINDINGS: Artifact overlies the chest. Pacemaker/AICD remains in place. Heart size is normal. Chronic aortic atherosclerosis. Pulmonary vascularity is normal. Lungs are clear except for calcified granuloma in the right upper lobe. Blunting of the posterior costophrenic angles could be consistent with pleural scarring or a small amount of pleural fluid. No significant bone finding. IMPRESSION: Pacemaker/AICD. Aortic atherosclerosis. Posterior costophrenic angle blunting could be due to pleural scarring or small effusions. Electronically Signed   By: Paulina Fusi M.D.   On: 03/19/2017 12:53   Dg Thoracic Spine W/swimmers  Result Date: 03/19/2017 CLINICAL DATA:  Lethargy.  Back pain. EXAM: THORACIC SPINE - 3 VIEWS COMPARISON:  None. FINDINGS: Normal alignment. No fracture. No disc space narrowing. No focal bone finding. Posteromedial ribs  are negative. IMPRESSION: Negative. Electronically Signed   By: Paulina Fusi M.D.   On: 03/19/2017 12:55   Dg Lumbar Spine Complete  Result Date: 03/19/2017 CLINICAL DATA:  Lethargy.  Back pain. EXAM: LUMBAR SPINE - COMPLETE 4+ VIEW COMPARISON:  CT 10/12/2015 FINDINGS: Alignment is normal. Mild disc space narrowing L3-4 and L4-5. Mild lower lumbar facet arthritis. No fracture or other focal spinal lesion. Aortoiliac calcification incidentally noted. Mild sacroiliac osteoarthritis and hip osteoarthritis left more than right. 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 Chronic lower lumbar facet arthritis. No fracture or other acute or advanced finding. Aortoiliac atherosclerosis. Sacroiliac osteoarthritis.  Hip osteoarthritis left more than right. Electronically Signed   By: Paulina Fusi M.D.   On: 03/19/2017 12:55    EKG: Independently reviewed.  Afib with NS-ST TW changes   Labs on Admission: I have personally reviewed the available labs and imaging studies at the time of the admission.  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   Hypokalemia   Hypocalcemia   HLD (hyperlipidemia)     Generalized weakness - multifactorial, consider dehydration, in setting of ongoing use of alcohol, electrolyte abnormality, hypothyroidism Will ask PT to evaluate patient Correct electrolyte imbalance and address TSH abnormality UA to rule out UTI as cause of weakness  Hypothyroidism - post ablative after radiation therapy for Graves' disease TSH is 126.666, patient is on 50 mcg of Levothroid  Electrolyte imbalance with hypokalemia and hypocalcemia Replete in the ED Continue replating  and follow the levels Will check Mag level and Phosphorus.  Orthostatic hypotension - most likely d/t autonomic dysregulation Will ask PT to evaluate  ETOH abuse Will check ETOH level and initiate CIWA protocol     DVT prophylaxis:Eliquis Code Status: Full - confirmed with patient/family Family Communication: none Disposition Plan: Home once clinically improved Consults called: none Admission status: inpatient    Raymon Mutton PA-C [phone] [facility]  If note is complete, please contact  covering daytime or nighttime physician. [email or web address] Password [facility]  03/19/2017, 4:59 PM

## 2017-03-19 NOTE — Progress Notes (Signed)
Pt is alert and oriented from home with little to no family support. Weak and lost 20 lb uses a cane and walker. Multiple falls, has called the fire department to get him off the floor at home at time, achy when walking. Plan to adjust meds and monitor labs

## 2017-03-19 NOTE — ED Triage Notes (Signed)
Pt arrived via [facility] after being sent to the ED from his cardiologist's office. Pt has been feeling poorly for 2 months but has gotten worse over the past 2 weeks. Sending clinic had concern about elevated TSH levels, but no documentation was sent with pt. Pt has pacemaker and cardiac hx. Pt is alert and oriented x4 at time of triage. Pt denies n/v but c/o of diarrhea. Pt also denies fever recent fevers. EMS noted positive orthostatics: 102/60 statnding, 82/50 laying. Last set, 102/72 (after NS infusion), hr 73, 100% RA.

## 2017-03-19 NOTE — ED Notes (Signed)
Pacemaker interrogation complete. Awaiting response from [employer]

## 2017-03-20 ENCOUNTER — Encounter (HOSPITAL_COMMUNITY): Payer: Self-pay

## 2017-03-20 ENCOUNTER — Other Ambulatory Visit: Payer: Self-pay

## 2017-03-20 DIAGNOSIS — E876 Hypokalemia: Secondary | ICD-10-CM

## 2017-03-20 DIAGNOSIS — F1010 Alcohol abuse, uncomplicated: Secondary | ICD-10-CM

## 2017-03-20 DIAGNOSIS — I5042 Chronic combined systolic (congestive) and diastolic (congestive) heart failure: Secondary | ICD-10-CM

## 2017-03-20 DIAGNOSIS — E890 Postprocedural hypothyroidism: Secondary | ICD-10-CM

## 2017-03-20 DIAGNOSIS — R531 Weakness: Principal | ICD-10-CM

## 2017-03-20 DIAGNOSIS — E785 Hyperlipidemia, unspecified: Secondary | ICD-10-CM

## 2017-03-20 LAB — CUP PACEART INCLINIC DEVICE CHECK
Battery Voltage: 2.64 V
Brady Statistic AP VP Percent: 21.37 %
Brady Statistic AS VP Percent: 74.08 %
Brady Statistic RA Percent Paced: 20.26 %
Brady Statistic RV Percent Paced: 90.28 %
Date Time Interrogation Session: 20190129215610
HIGH POWER IMPEDANCE MEASURED VALUE: 760 Ohm
HighPow Impedance: 44 Ohm
HighPow Impedance: 54 Ohm
Implantable Lead Implant Date: 20080710
Implantable Lead Implant Date: 20080710
Implantable Lead Location: 753858
Implantable Lead Location: 753860
Implantable Lead Model: 4194
Implantable Lead Model: 6947
Lead Channel Impedance Value: 532 Ohm
Lead Channel Pacing Threshold Amplitude: 0.5 V
Lead Channel Pacing Threshold Amplitude: 1.375 V
Lead Channel Pacing Threshold Pulse Width: 0.4 ms
Lead Channel Pacing Threshold Pulse Width: 0.4 ms
Lead Channel Sensing Intrinsic Amplitude: 1.875 mV
Lead Channel Sensing Intrinsic Amplitude: 2 mV
Lead Channel Sensing Intrinsic Amplitude: 4.125 mV
Lead Channel Setting Pacing Amplitude: 2.5 V
Lead Channel Setting Pacing Pulse Width: 0.4 ms
Lead Channel Setting Pacing Pulse Width: 0.4 ms
Lead Channel Setting Sensing Sensitivity: 0.3 mV
MDC IDC LEAD IMPLANT DT: 20080710
MDC IDC LEAD LOCATION: 753859
MDC IDC MSMT LEADCHNL LV IMPEDANCE VALUE: 228 Ohm
MDC IDC MSMT LEADCHNL LV IMPEDANCE VALUE: 399 Ohm
MDC IDC MSMT LEADCHNL RA IMPEDANCE VALUE: 456 Ohm
MDC IDC MSMT LEADCHNL RA PACING THRESHOLD PULSEWIDTH: 0.4 ms
MDC IDC MSMT LEADCHNL RV IMPEDANCE VALUE: 836 Ohm
MDC IDC MSMT LEADCHNL RV PACING THRESHOLD AMPLITUDE: 0.625 V
MDC IDC MSMT LEADCHNL RV SENSING INTR AMPL: 3.25 mV
MDC IDC PG IMPLANT DT: 20131107
MDC IDC SET LEADCHNL LV PACING AMPLITUDE: 2.5 V
MDC IDC SET LEADCHNL RA PACING AMPLITUDE: 2 V
MDC IDC STAT BRADY AP VS PERCENT: 0.03 %
MDC IDC STAT BRADY AS VS PERCENT: 4.51 %

## 2017-03-20 LAB — BASIC METABOLIC PANEL
ANION GAP: 12 (ref 5–15)
BUN: 10 mg/dL (ref 6–20)
CO2: 28 mmol/L (ref 22–32)
Calcium: 7.7 mg/dL — ABNORMAL LOW (ref 8.9–10.3)
Chloride: 101 mmol/L (ref 101–111)
Creatinine, Ser: 1.28 mg/dL — ABNORMAL HIGH (ref 0.61–1.24)
GFR calc non Af Amer: 55 mL/min — ABNORMAL LOW (ref >60)
Glucose, Bld: 94 mg/dL (ref 65–99)
POTASSIUM: 3.3 mmol/L — AB (ref 3.5–5.1)
SODIUM: 141 mmol/L (ref 135–145)

## 2017-03-20 LAB — CBC WITH DIFFERENTIAL/PLATELET
BASOS ABS: 0 10*3/uL (ref 0.0–0.1)
Basophils Relative: 0 %
EOS ABS: 0 10*3/uL (ref 0.0–0.7)
Eosinophils Relative: 0 %
HCT: 31.5 % — ABNORMAL LOW (ref 39.0–52.0)
Hemoglobin: 10.5 g/dL — ABNORMAL LOW (ref 13.0–17.0)
LYMPHS ABS: 0.9 10*3/uL (ref 0.7–4.0)
Lymphocytes Relative: 18 %
MCH: 32.3 pg (ref 26.0–34.0)
MCHC: 33.3 g/dL (ref 30.0–36.0)
MCV: 96.9 fL (ref 78.0–100.0)
MONO ABS: 0.5 10*3/uL (ref 0.1–1.0)
Monocytes Relative: 10 %
NEUTROS ABS: 3.7 10*3/uL (ref 1.7–7.7)
Neutrophils Relative %: 72 %
Platelets: 104 10*3/uL — ABNORMAL LOW (ref 150–400)
RBC: 3.25 MIL/uL — AB (ref 4.22–5.81)
RDW: 15.3 % (ref 11.5–15.5)
WBC: 5.1 10*3/uL (ref 4.0–10.5)

## 2017-03-20 LAB — PHOSPHORUS: PHOSPHORUS: 2.8 mg/dL (ref 2.5–4.6)

## 2017-03-20 LAB — MAGNESIUM: MAGNESIUM: 1.6 mg/dL — AB (ref 1.7–2.4)

## 2017-03-20 MED ORDER — APIXABAN 5 MG PO TABS
5.0000 mg | ORAL_TABLET | Freq: Two times a day (BID) | ORAL | Status: DC
  Administered 2017-03-20 – 2017-03-21 (×2): 5 mg via ORAL
  Filled 2017-03-20 (×2): qty 1

## 2017-03-20 MED ORDER — SODIUM CHLORIDE 0.9 % IV SOLN
INTRAVENOUS | Status: DC
  Administered 2017-03-20: 18:00:00 via INTRAVENOUS

## 2017-03-20 MED ORDER — POTASSIUM CHLORIDE CRYS ER 20 MEQ PO TBCR
40.0000 meq | EXTENDED_RELEASE_TABLET | Freq: Two times a day (BID) | ORAL | Status: AC
  Administered 2017-03-20 (×2): 40 meq via ORAL
  Filled 2017-03-20 (×2): qty 2

## 2017-03-20 MED ORDER — APIXABAN 5 MG PO TABS
5.0000 mg | ORAL_TABLET | Freq: Every day | ORAL | Status: DC
  Administered 2017-03-20: 5 mg via ORAL
  Filled 2017-03-20: qty 1

## 2017-03-20 MED ORDER — MAGNESIUM SULFATE 2 GM/50ML IV SOLN
2.0000 g | Freq: Once | INTRAVENOUS | Status: AC
  Administered 2017-03-20: 2 g via INTRAVENOUS
  Filled 2017-03-20: qty 50

## 2017-03-20 MED ORDER — ENSURE ENLIVE PO LIQD
237.0000 mL | Freq: Two times a day (BID) | ORAL | Status: DC
  Administered 2017-03-20: 237 mL via ORAL

## 2017-03-20 MED ORDER — ATORVASTATIN CALCIUM 40 MG PO TABS
40.0000 mg | ORAL_TABLET | Freq: Every day | ORAL | Status: DC
  Administered 2017-03-20: 40 mg via ORAL
  Filled 2017-03-20: qty 1

## 2017-03-20 MED ORDER — POTASSIUM CHLORIDE CRYS ER 20 MEQ PO TBCR
40.0000 meq | EXTENDED_RELEASE_TABLET | Freq: Once | ORAL | Status: AC
  Administered 2017-03-20: 40 meq via ORAL
  Filled 2017-03-20: qty 2

## 2017-03-20 MED ORDER — CARVEDILOL 6.25 MG PO TABS
6.2500 mg | ORAL_TABLET | Freq: Two times a day (BID) | ORAL | Status: DC
  Administered 2017-03-20 – 2017-03-21 (×2): 6.25 mg via ORAL
  Filled 2017-03-20 (×2): qty 1

## 2017-03-20 MED ORDER — ALLOPURINOL 100 MG PO TABS
100.0000 mg | ORAL_TABLET | Freq: Every day | ORAL | Status: DC
  Administered 2017-03-20 – 2017-03-21 (×2): 100 mg via ORAL
  Filled 2017-03-20 (×2): qty 1

## 2017-03-20 MED ORDER — ENSURE ENLIVE PO LIQD
237.0000 mL | Freq: Three times a day (TID) | ORAL | Status: DC
  Administered 2017-03-20 – 2017-03-21 (×2): 237 mL via ORAL

## 2017-03-20 MED ORDER — LEVOTHYROXINE SODIUM 75 MCG PO TABS
75.0000 ug | ORAL_TABLET | Freq: Every day | ORAL | Status: DC
  Administered 2017-03-21: 75 ug via ORAL
  Filled 2017-03-20: qty 1

## 2017-03-20 MED ORDER — SODIUM CHLORIDE 0.9 % IV BOLUS (SEPSIS)
500.0000 mL | Freq: Once | INTRAVENOUS | Status: AC
  Administered 2017-03-20: 500 mL via INTRAVENOUS

## 2017-03-20 MED ORDER — SODIUM CHLORIDE 0.9 % IV SOLN
1.0000 g | Freq: Once | INTRAVENOUS | Status: AC
  Administered 2017-03-20: 1 g via INTRAVENOUS
  Filled 2017-03-20: qty 10

## 2017-03-20 MED ORDER — POTASSIUM CHLORIDE CRYS ER 20 MEQ PO TBCR: 40.0000 meq | EXTENDED_RELEASE_TABLET | Freq: Two times a day (BID) | ORAL | Status: DC

## 2017-03-20 NOTE — Discharge Instructions (Signed)

## 2017-03-20 NOTE — Consult Note (Signed)
   [facility] CM Inpatient Consult   03/20/2017  Don [name] 1945/10/24 [id number]   Patient was assessed for [place] [facility] for community services in the [facility] Advantage plan. Patient was previously active with [place] [facility]. Patient with a HX including but not limited to CAD, HF, Chronic Kidney disease.  Met with patient at bedside regarding being restarted with [facility] services.Patient agrees for follow up if needed. States, "I just feel so bad right now and weak. Currently PT evaluation is recommending home health PT.  Written consent form on file and a brochure with [place] [facility] information given and 24 hour magnet given.  Of note, [facility] Care Management services does not replace or interfere with any services that are arranged by inpatient case management or social work.  Patient's current disposition is unknown will follow appropriately.  For additional questions or referrals please contact:  Natividad Brood, RN BSN [facility] Liaison  [phone] business mobile phone Toll free office [phone]

## 2017-03-20 NOTE — Care Management Note (Signed)
Case Management Note  Patient Details  Name: Don [name] MRN: [id number] Date of Birth: March 16, 1945  Subjective/Objective: Weakness, AKI,CHF                  Action/Plan: Patient lives at home alone with his cat; PCP: Smothers, Cathleen Corti, NP; has private insurance with [employer] with prescription drug coverage; pharmacy of choice is [facility]; DME - cane and walker at home; [facility] choice offered, pt chose [facility]; Dan with [facility] called for arrangements.   Expected Discharge Date:     Possibly 03/21/2017             Expected Discharge Plan:  Home w Home Health Services  In-House Referral:  Clinical Social Work  Discharge planning Services  CM Consult    Choice offered to:  Patient  HH Arranged:  PT,RN HH Agency:  [facility]  Status of Service:  In process, will continue to follow  Reola Mosher [phone] 03/20/2017, 12:10 PM

## 2017-03-20 NOTE — Progress Notes (Signed)
Initial Nutrition Assessment  DOCUMENTATION CODES:   Not applicable  INTERVENTION:   -Ensure Enlive po TID, each supplement provides 350 kcal and 20 grams of protein -MVI daily  NUTRITION DIAGNOSIS:   Moderate Malnutrition related to chronic illness(ETOH abuse) as evidenced by energy intake < 75% for > or equal to 1 month, mild fat depletion, mild muscle depletion, energy intake < or equal to 75% for > or equal to 1 month.  GOAL:   Patient will meet greater than or equal to 90% of their needs  MONITOR:   PO intake, Supplement acceptance, Labs, Weight trends, Skin, I & O's  REASON FOR ASSESSMENT:   Malnutrition Screening Tool    ASSESSMENT:   Don [name] is a 72 y.o. male with medical history significant of  CAD, systolic CHF, CKD, TN, hypothyroidism, alcohol abuse, GI bleed on warfarin  - now on Eliquis, CVA presented to the ED from his PCP office d/t significant weakness and malaise. Patient also reported that he has not been eating for days d/t loss of appetite and weakness has been present for few months.  Pt admitted with generalized weakness. Pt with ETOH abuse.   Spoke with pt at bedside, who reports poor appetite over the past several months. He states that he often goes days without eating, as he does not have the motivation to cook for himself or go out to eat. It has also been more difficult for him to get around, especially stairs (which he avoids using when at all possible). Pt shares that most of his intake consumes from drinking (on average, pot admits to consuming at least a half of a fifth of alcohol, often mixed with either juice or soda). Pt consumed less than 25% of his breakfast, but did consume an entire Ensure supplement. Pt shares he will consume [facility] brand nutritional supplements on occasion.   Reviewed wt hx; UBW around 222#. Noted pt has experienced a 10.7% wt loss over the past 3 months, which is significant for time frame.   Medications  reviewed and include folvite and thiamine.   Discussed importance of good meal and supplement intake to promote healing. Pt amenable to continue Ensure.   Labs reviewed: K: 3.3 (on PO supplementation), Mg: 1.6 (on IV supplementation).   NUTRITION - FOCUSED PHYSICAL EXAM:    Most Recent Value  Orbital Region  Mild depletion  Upper Arm Region  Mild depletion  Thoracic and Lumbar Region  No depletion  Buccal Region  No depletion  Temple Region  No depletion  Clavicle Bone Region  No depletion  Clavicle and Acromion Bone Region  No depletion  Scapular Bone Region  No depletion  Dorsal Hand  Mild depletion  Patellar Region  Mild depletion  Anterior Thigh Region  Mild depletion  Posterior Calf Region  Mild depletion  Edema (RD Assessment)  None  Hair  Reviewed  Eyes  Reviewed  Mouth  Reviewed  Skin  Reviewed  Nails  Reviewed       Diet Order:  Diet Heart Room service appropriate? Yes; Fluid consistency: Thin  EDUCATION NEEDS:   Education needs have been addressed  Skin:  Skin Assessment: Reviewed RN Assessment  Last BM:  03/20/17  Height:   Ht Readings from Last 1 Encounters:  03/19/17 6\' 1"  (1.854 m)    Weight:   Wt Readings from Last 1 Encounters:  03/20/17 201 lb 3.2 oz (91.3 kg)    Ideal Body Weight:  83.6 kg  BMI:  Body  mass index is 26.55 kg/m.  Estimated Nutritional Needs:   Kcal:  2000-2200  Protein:  115-130 grams  Fluid:  2.0-2.2 L    Don [name], RD, LDN, CDE Pager: [phone] After hours Pager: [phone]

## 2017-03-20 NOTE — Evaluation (Signed)
Physical Therapy Evaluation Patient Details Name: Don [name] MRN: [id number] DOB: 05/23/1945 Today's Date: 03/20/2017   History of Present Illness  Pt adm with weakness. PMH -  ETOH, CAD, CHF, CKD, GI bleed  Clinical Impression  Pt presents to PT with gait that is limited primarily by back and neck pain. Pt mobilizing adequately for household distances. Recommended to pt that he follow up with primary MD about his back and neck pain (pt reports this pain not new but that he hasn't seen a doctor for it). Feel he could benefit from HHPT to assist pt to continue to maximize independence and safety.    Follow Up Recommendations Home health PT    Equipment Recommendations  None recommended by PT    Recommendations for Other Services       Precautions / Restrictions Precautions Precautions: Fall Restrictions Weight Bearing Restrictions: No      Mobility  Bed Mobility Overal bed mobility: Modified Independent                Transfers Overall transfer level: Needs assistance Equipment used: Rolling walker (2 wheeled) Transfers: Sit to/from Stand Sit to Stand: Supervision         General transfer comment: Pt initially began to pull up on walker to stand but self corrected himself and pushed up from bed.  Ambulation/Gait Ambulation/Gait assistance: Supervision Ambulation Distance (Feet): 250 Feet Assistive device: Rolling walker (2 wheeled) Gait Pattern/deviations: Step-through pattern;Decreased stride length;Trunk flexed Gait velocity: decr Gait velocity interpretation: Below normal speed for age/gender General Gait Details: Verbal cues to stand more erect  Stairs            Wheelchair Mobility    Modified Rankin (Stroke Patients Only)       Balance Overall balance assessment: Needs assistance Sitting-balance support: No upper extremity supported;Feet supported Sitting balance-Leahy Scale: Normal     Standing balance support: No upper  extremity supported Standing balance-Leahy Scale: Fair                               Pertinent Vitals/Pain Pain Assessment: 0-10 Pain Score: 7  Pain Location: back and neck when amb. relieved when lying back down Pain Descriptors / Indicators: Heaviness Pain Intervention(s): Limited activity within patient's tolerance;Monitored during session;Repositioned    Home Living Family/patient expects to be discharged to:: Private residence Living Arrangements: Alone   Type of Home: House Home Access: Stairs to enter Entrance Stairs-Rails: Right;Left;Can reach both Entrance Stairs-Number of Steps: 8 Home Layout: Multi-level;Bed/bath upstairs Home Equipment: Environmental consultant - 2 wheels;Cane - single point      Prior Function Level of Independence: Independent with assistive device(s)         Comments: Uses cane when out. Reports difficulty getting up from low furniture. History of falls     Hand Dominance   Dominant Hand: Right    Extremity/Trunk Assessment   Upper Extremity Assessment Upper Extremity Assessment: Defer to OT evaluation    Lower Extremity Assessment Lower Extremity Assessment: Generalized weakness;RLE deficits/detail;LLE deficits/detail RLE Deficits / Details: Pt reports numbness and heaviness in legs with amb LLE Deficits / Details: Pt reports numbness and heaviness in legs with amb    Cervical / Trunk Assessment Cervical / Trunk Assessment: Kyphotic  Communication   Communication: No difficulties  Cognition Arousal/Alertness: Awake/alert Behavior During Therapy: Flat affect Overall Cognitive Status: Within Functional Limits for tasks assessed  General Comments      Exercises     Assessment/Plan    PT Assessment Patient needs continued PT services  PT Problem List Decreased strength;Decreased activity tolerance;Decreased balance;Decreased mobility;Pain       PT Treatment  Interventions DME instruction;Gait training;Stair training;Functional mobility training;Therapeutic activities;Therapeutic exercise;Balance training;Patient/family education    PT Goals (Current goals can be found in the Care Plan section)  Acute Rehab PT Goals Patient Stated Goal: return home PT Goal Formulation: With patient Time For Goal Achievement: 03/27/17 Potential to Achieve Goals: Good    Frequency Min 3X/week   Barriers to discharge Inaccessible home environment;Decreased caregiver support lives alone and bedroom on 2nd story    Co-evaluation               AM-PAC PT "6 Clicks" Daily Activity  Outcome Measure Difficulty turning over in bed (including adjusting bedclothes, sheets and blankets)?: A Little Difficulty moving from lying on back to sitting on the side of the bed? : A Little Difficulty sitting down on and standing up from a chair with arms (e.g., wheelchair, bedside commode, etc,.)?: A Little Help needed moving to and from a bed to chair (including a wheelchair)?: A Little Help needed walking in hospital room?: A Little Help needed climbing 3-5 steps with a railing? : A Little 6 Click Score: 18    End of Session   Activity Tolerance: Patient tolerated treatment well Patient left: in bed;with call bell/phone within reach Nurse Communication: Mobility status PT Visit Diagnosis: Other abnormalities of gait and mobility (R26.89);History of falling (Z91.81);Muscle weakness (generalized) (M62.81);Pain Pain - part of body: (back/neck)    Time: 1610-9604 PT Time Calculation (min) (ACUTE ONLY): 14 min   Charges:   PT Evaluation $PT Eval Moderate Complexity: 1 Mod     PT G CodesMarland Kitchen        [facility] PT [phone]   Angelina Ok [facility] 03/20/2017, 1:03 PM

## 2017-03-20 NOTE — Progress Notes (Signed)
   03/20/17 1600  Orthostatic Lying   BP- Lying 105/82  Pulse- Lying 73  Orthostatic Sitting  BP- Sitting 95/75  Pulse- Sitting 86  Orthostatic Standing at 0 minutes  BP- Standing at 0 minutes (!) 87/63  Pulse- Standing at 0 minutes 98  3 min: 92/65 pulse 92

## 2017-03-20 NOTE — Progress Notes (Signed)
PROGRESS NOTE    Don [name]  ZOX:[id number] DOB: 12-10-1945 DOA: 03/19/2017 PCP: Don [name] Don Corti, NP   Brief Narrative:  Don [name] is a 72 y.o. male with a Past Medical History of TIA; hypothyroidism; AICD; HTN; CAD; chronic combined heart failure CHF; and ETOH abuse who presents with weakness.  He has markedly elevated TSH (has been on stable dose of Synthroid for many years); Mag 1.2; Phos 2.3; K+ 2.8; Albumin 3.3; Ionized calcium 0.84; TSH 126.664 with free T4 0.93.  On exam, he is somewhat cantankerous but is loquacious and in NAD.  He acknowledges depression and a solitary lifestyle with decreased mobility recently.  He does have back pain which is contributing in the low back. Admitted for Generalized Weakness and Orthostatic Hypotension, and has multiple Electrolyte Abnormalities.   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   Hypokalemia   Hypocalcemia   HLD (hyperlipidemia)  Severe Generalized Weakness and Dizziness  -Multifactorial, consider dehydration, in setting of ongoing use of alcohol, electrolyte abnormaliies, hypothyroidism -PT Evaluated and recommending Home Health PT -Correct electrolyte imbalance and address TSH abnormality -UA to rule out UTI as cause of weakness and Urine Negative -C/w IVF Rehydration as below   Orthostatic Hypotension -Most likely due to Autonomic Dysregulation -Orthostatics repeated today and patient was still dizzy -Will bolus 500 mL and restart IVF rehydration at 75 mL/hr x 15 hours -Order TED Hose -Obtain ECHOCardiogram -Repeat Orthostatic Vital Signs in AM   Hypothyroidism -Post ablative after radiation therapy for Graves' disease -TSH is now 49.666; Free T4 was 0.93 -Patient is on 50 mcg of Levothroid -Repeat  TSH in AM  Hypokalemia and Hypocalcemia -Replete  -Continue to monitor and Repeat CMP in AM   ETOH Abuse -CIWA Protocol -C/w Folic Acid, MVI, Thiamine  Hypomagnesemia -Patient's Mag Level was 1.6 this AM -Replete with IV Mag Sulfate 2 grams -Continue to Monitor and Replete Mag Level as Necessary -Repeat Mag Level in AM  Back Pain -C/w K Pad  HLD -C/w Atorvastatin 40 mg po qHS  Hx of Chronic Combined CHF and CAD and NICM -C/w Coreg and Atorvastatin  -Continue to monitor Volume Status Closely given CHF  Hx of TIA/CVA -C/w Eliquis and Atorvastatin   Hx of GIB -Continue to Monitor H/H -On Anticoagulation with Eliquis   Elevated AST -? Related to EtOH -Continue to Monitor and repeat CMP -If continues to trend up will obtain Acute Hepatitis Panel and RUQ  DVT prophylaxis: Anticoagulated with Apixaban Code Status: DO NOT RESUSCITATE Family Communication: No family present at bedside Disposition Plan: Anticipated D/C Home with Home Health in 24-48 hours if stable  Consultants:   None   Procedures:  ECHOCARDIOGRAM  Antimicrobials:  Anti-infectives (From admission, onward)   None     Subjective: Seen and examined and patient states he was feeling better and was wanting to go home but upon recheck of Orthostatic vital signs was dizzy. No CP or SOB and has had falls in the past. No nausea or vomiting.   Objective: Vitals:   03/20/17 0000 03/20/17 0117 03/20/17 0607 03/20/17 0613  BP: 96/70 101/67 109/81   Pulse: 77 73 67   Resp: 18  18   Temp: 97.8 F (36.6 C) 98 F (36.7 C) 98 F (36.7 C)   TempSrc: Oral  Oral Oral   SpO2: 98% 96% 98%   Weight:    91.3 kg (201 lb 3.2 oz)  Height:        Intake/Output Summary (Last 24 hours) at 03/20/2017 0816 Last data filed at 03/20/2017 6045 Gross per 24 hour  Intake 1210 ml  Output 0 ml  Net 1210 ml   Filed Weights   03/19/17 1740 03/20/17 4098  Weight: 90.5 kg (199 lb 8 oz) 91.3 kg (201 lb 3.2 oz)    Examination: Physical Exam:  Constitutional: WN/WD Caucasian male in NAD and appears calm and comfortable Eyes: Lids and conjunctivae normal, sclerae anicteric  ENMT: External Ears, Nose appear normal. Grossly normal hearing. Mucous membranes are moist.   Neck: Appears normal, supple, no cervical masses, normal ROM, no appreciable thyromegaly; no JVD Respiratory: Diminished to auscultation bilaterally, no wheezing, rales, rhonchi or crackles. Normal respiratory effort and patient is not tachypenic. No accessory muscle use.  Cardiovascular: RRR, no murmurs / rubs / gallops. S1 and S2 auscultated. No extremity edema.  Abdomen: Soft, non-tender, non-distended. No masses palpated. No appreciable hepatosplenomegaly. Bowel sounds positive x4.  GU: Deferred. Musculoskeletal: No clubbing / cyanosis of digits/nails. No joint deformity upper and lower extremities. .  Skin: No rashes, lesions, ulcers on a limited skin eval. No induration; Warm and dry.  Neurologic: CN 2-12 grossly intact with no focal deficits.  Psychiatric: Normal judgment and insight. Alert and oriented x 3. Normal mood and appropriate affect.   Data Reviewed: I have personally reviewed following labs and imaging studies  CBC: Recent Labs  Lab 03/19/17 1154 03/19/17 1458  WBC 8.1  --   NEUTROABS 6.2  --   HGB 12.7* 10.5*  HCT 35.7* 31.0*  MCV 97.3  --   PLT 147*  --    Basic Metabolic Panel: Recent Labs  Lab 03/19/17 1216 03/19/17 1458 03/19/17 1718 03/19/17 1729 03/20/17 0518  NA 138 138  --   --  141  K 3.3* 2.8*  --   --  3.3*  CL 89* 94*  --   --  101  CO2 29  --   --   --  28  GLUCOSE 89 72  --   --  94  BUN 7 7  --   --  10  CREATININE 1.45* 1.10  --   --  1.28*  CALCIUM 8.1*  --   --   --  7.7*  MG  --   --  1.2*  --  1.6*  PHOS  --   --   --  2.3* 2.8   GFR: Estimated Creatinine Clearance: 59.8 mL/min (A) (by C-G formula based on SCr of 1.28 mg/dL (H)). Liver Function Tests: Recent Labs  Lab  03/17/17 1606 03/19/17 1216  AST 47* 56*  ALT 18 22  ALKPHOS 75 73  BILITOT 0.7 1.7*  PROT 6.3 6.5  ALBUMIN 3.6 3.3*   No results for input(s): LIPASE, AMYLASE in the last 168 hours. No results for input(s): AMMONIA in the last 168 hours. Coagulation Profile: No results for input(s): INR, PROTIME in the last 168 hours. Cardiac Enzymes: Recent Labs  Lab 03/17/17 1606 03/19/17 1216  CKTOTAL 43  --   CKMBINDEX 1.7  --   TROPONINI  --  <0.03   BNP (last 3 results) No results for input(s): PROBNP in the last 8760 hours. HbA1C: No results for input(s): HGBA1C in the last 72 hours. CBG: No results for input(s): GLUCAP in the last 168  hours. Lipid Profile: No results for input(s): CHOL, HDL, LDLCALC, TRIG, CHOLHDL, LDLDIRECT in the last 72 hours. Thyroid Function Tests: Recent Labs    03/19/17 1154  TSH 126.664*  FREET4 0.93   Anemia Panel: No results for input(s): VITAMINB12, FOLATE, FERRITIN, TIBC, IRON, RETICCTPCT in the last 72 hours. Sepsis Labs: Recent Labs  Lab 03/19/17 1458  LATICACIDVEN 1.54    No results found for this or any previous visit (from the past 240 hour(s)).   Radiology Studies: Dg Chest 2 View  Result Date: 03/19/2017 CLINICAL DATA:  Weakness.  Orthostasis. EXAM: CHEST  2 VIEW COMPARISON:  12/25/2016 FINDINGS: Artifact overlies the chest. Pacemaker/AICD remains in place. Heart size is normal. Chronic aortic atherosclerosis. Pulmonary vascularity is normal. Lungs are clear except for calcified granuloma in the right upper lobe. Blunting of the posterior costophrenic angles could be consistent with pleural scarring or a small amount of pleural fluid. No significant bone finding. IMPRESSION: Pacemaker/AICD. Aortic atherosclerosis. Posterior costophrenic angle blunting could be due to pleural scarring or small effusions. Electronically Signed   By: Paulina Fusi M.D.   On: 03/19/2017 12:53   Dg Thoracic Spine W/swimmers  Result Date:  03/19/2017 CLINICAL DATA:  Lethargy.  Back pain. EXAM: THORACIC SPINE - 3 VIEWS COMPARISON:  None. FINDINGS: Normal alignment. No fracture. No disc space narrowing. No focal bone finding. Posteromedial ribs are negative. IMPRESSION: Negative. Electronically Signed   By: Paulina Fusi M.D.   On: 03/19/2017 12:55   Dg Lumbar Spine Complete  Result Date: 03/19/2017 CLINICAL DATA:  Lethargy.  Back pain. EXAM: LUMBAR SPINE - COMPLETE 4+ VIEW COMPARISON:  CT 10/12/2015 FINDINGS: Alignment is normal. Mild disc space narrowing L3-4 and L4-5. Mild lower lumbar facet arthritis. No fracture or other focal spinal lesion. Aortoiliac calcification incidentally noted. Mild sacroiliac osteoarthritis and hip osteoarthritis left more than right. 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 Chronic lower lumbar facet arthritis. No fracture or other acute or advanced finding. Aortoiliac atherosclerosis. Sacroiliac osteoarthritis.  Hip osteoarthritis left more than right. Electronically Signed   By: Paulina Fusi M.D.   On: 03/19/2017 12:55   Scheduled Meds: . feeding supplement (ENSURE ENLIVE)  237 mL Oral BID BM  . folic acid  1 mg Oral Daily  . levothyroxine  75 mcg Oral QAC breakfast  . multivitamin with minerals  1 tablet Oral Daily  . thiamine  100 mg Oral Daily   Or  . thiamine  100 mg Intravenous Daily   Continuous Infusions:   LOS: 1 day   Merlene Laughter, DO [facility] Pager [phone]  If 7PM-7AM, please contact night-coverage [email or web address] Password [email or web address] 03/20/2017, 8:16 AM

## 2017-03-21 ENCOUNTER — Inpatient Hospital Stay (HOSPITAL_COMMUNITY): Payer: PPO

## 2017-03-21 DIAGNOSIS — M546 Pain in thoracic spine: Secondary | ICD-10-CM

## 2017-03-21 DIAGNOSIS — R55 Syncope and collapse: Secondary | ICD-10-CM

## 2017-03-21 LAB — ECHOCARDIOGRAM COMPLETE
CHL CUP STROKE VOLUME: 43 mL
FS: 23 % — AB (ref 28–44)
Height: 73 in
IV/PV OW: 0.87
LADIAMINDEX: 1.94 cm/m2
LASIZE: 43 mm
LAVOL: 69.7 mL
LAVOLA4C: 66 mL
LAVOLIN: 31.5 mL/m2
LEFT ATRIUM END SYS DIAM: 43 mm
LV TDI E'LATERAL: 7.15
LV e' LATERAL: 7.15 cm/s
LV sys vol: 88 mL — AB (ref 21–61)
LVDIAVOL: 131 mL (ref 62–150)
LVDIAVOLIN: 59 mL/m2
LVOT VTI: 14.1 cm
LVOT area: 5.31 cm2
LVOT peak vel: 66.5 cm/s
LVOTD: 26 mm
LVOTSV: 75 mL
LVSYSVOLIN: 40 mL/m2
Lateral S' vel: 9 cm/s
PW: 12.3 mm — AB (ref 0.6–1.1)
RV sys press: 30 mmHg
Reg peak vel: 259 cm/s
Simpson's disk: 33
TDI e' medial: 5.46
TR max vel: 259 cm/s
Weight: 3315.2 oz

## 2017-03-21 LAB — CBC WITH DIFFERENTIAL/PLATELET
BASOS PCT: 0 %
Basophils Absolute: 0 10*3/uL (ref 0.0–0.1)
EOS ABS: 0 10*3/uL (ref 0.0–0.7)
EOS PCT: 0 %
HEMATOCRIT: 30.4 % — AB (ref 39.0–52.0)
Hemoglobin: 10.1 g/dL — ABNORMAL LOW (ref 13.0–17.0)
Lymphocytes Relative: 18 %
Lymphs Abs: 1 10*3/uL (ref 0.7–4.0)
MCH: 33.1 pg (ref 26.0–34.0)
MCHC: 33.2 g/dL (ref 30.0–36.0)
MCV: 99.7 fL (ref 78.0–100.0)
MONO ABS: 0.6 10*3/uL (ref 0.1–1.0)
Monocytes Relative: 11 %
NEUTROS ABS: 3.8 10*3/uL (ref 1.7–7.7)
Neutrophils Relative %: 71 %
PLATELETS: 94 10*3/uL — AB (ref 150–400)
RBC: 3.05 MIL/uL — ABNORMAL LOW (ref 4.22–5.81)
RDW: 15.7 % — AB (ref 11.5–15.5)
WBC: 5.4 10*3/uL (ref 4.0–10.5)

## 2017-03-21 LAB — COMPREHENSIVE METABOLIC PANEL
ALBUMIN: 2.4 g/dL — AB (ref 3.5–5.0)
ALT: 17 U/L (ref 17–63)
ANION GAP: 9 (ref 5–15)
AST: 42 U/L — ABNORMAL HIGH (ref 15–41)
Alkaline Phosphatase: 75 U/L (ref 38–126)
BUN: 9 mg/dL (ref 6–20)
CHLORIDE: 104 mmol/L (ref 101–111)
CO2: 28 mmol/L (ref 22–32)
Calcium: 8.2 mg/dL — ABNORMAL LOW (ref 8.9–10.3)
Creatinine, Ser: 1.19 mg/dL (ref 0.61–1.24)
GFR calc Af Amer: >60 mL/min (ref >60)
GFR calc non Af Amer: 60 mL/min — ABNORMAL LOW (ref >60)
Glucose, Bld: 108 mg/dL — ABNORMAL HIGH (ref 65–99)
POTASSIUM: 4.1 mmol/L (ref 3.5–5.1)
SODIUM: 141 mmol/L (ref 135–145)
Total Bilirubin: 0.8 mg/dL (ref 0.3–1.2)
Total Protein: 5.2 g/dL — ABNORMAL LOW (ref 6.5–8.1)

## 2017-03-21 LAB — PHOSPHORUS: Phosphorus: 1.2 mg/dL — ABNORMAL LOW (ref 2.5–4.6)

## 2017-03-21 LAB — PARATHYROID HORMONE, INTACT (NO CA): PTH: 47 pg/mL (ref 15–65)

## 2017-03-21 LAB — MAGNESIUM: Magnesium: 1.8 mg/dL (ref 1.7–2.4)

## 2017-03-21 LAB — TSH: TSH: 123.36 u[IU]/mL — AB (ref 0.350–4.500)

## 2017-03-21 LAB — CALCIUM, IONIZED: Calcium, Ionized, Serum: 4.3 mg/dL — ABNORMAL LOW (ref 4.5–5.6)

## 2017-03-21 MED ORDER — MAGNESIUM SULFATE 2 GM/50ML IV SOLN
2.0000 g | Freq: Once | INTRAVENOUS | Status: AC
  Administered 2017-03-21: 2 g via INTRAVENOUS
  Filled 2017-03-21: qty 50

## 2017-03-21 MED ORDER — THIAMINE HCL 100 MG PO TABS: 100.0000 mg | ORAL_TABLET | Freq: Every day | ORAL | 0 refills | Status: AC

## 2017-03-21 MED ORDER — ADULT MULTIVITAMIN W/MINERALS CH: 1.0000 | ORAL_TABLET | Freq: Every day | ORAL | 0 refills | Status: AC

## 2017-03-21 MED ORDER — POTASSIUM PHOSPHATES 15 MMOLE/5ML IV SOLN
30.0000 mmol | Freq: Once | INTRAVENOUS | Status: AC
  Administered 2017-03-21: 30 mmol via INTRAVENOUS
  Filled 2017-03-21: qty 10

## 2017-03-21 MED ORDER — HYDROCORTISONE 2.5 % RE CREA: 1.0000 "application " | TOPICAL_CREAM | Freq: Three times a day (TID) | RECTAL | 0 refills | Status: AC

## 2017-03-21 MED ORDER — ENSURE ENLIVE PO LIQD: 237.0000 mL | Freq: Three times a day (TID) | ORAL | 12 refills | Status: AC

## 2017-03-21 MED ORDER — LEVOTHYROXINE SODIUM 75 MCG PO TABS: 75.0000 ug | ORAL_TABLET | Freq: Every day | ORAL | 0 refills | Status: AC

## 2017-03-21 MED ORDER — HYDROCORTISONE 2.5 % RE CREA
1.0000 "application " | TOPICAL_CREAM | Freq: Three times a day (TID) | RECTAL | Status: DC
  Filled 2017-03-21: qty 28.35

## 2017-03-21 MED ORDER — FOLIC ACID 1 MG PO TABS: 1.0000 mg | ORAL_TABLET | Freq: Every day | ORAL | 0 refills | Status: AC

## 2017-03-21 NOTE — Progress Notes (Signed)
Patient reports a "need" to go home today, states he will leave on his own if he is not discharged.   Pt agrees to work with [place] thus far this morning, allowing for IV electrolyte therapies etc.

## 2017-03-21 NOTE — Progress Notes (Signed)
  Echocardiogram 2D Echocardiogram has been performed.  Delcie Roch 03/21/2017, 12:14 PM

## 2017-03-21 NOTE — Evaluation (Signed)
Occupational Therapy Evaluation Patient Details Name: Don [name] MRN: [id number] DOB: October 08, 1945 Today's Date: 03/21/2017    History of Present Illness Pt adm with weakness. PMH -  ETOH, CAD, CHF, CKD, GI bleed   Clinical Impression   PTA, pt was independent with ADL and most IADL, but reported significant decrease in appetite and energy levels over the last 2 months and has been performing these tasks less. Pt has a hx of falling. Pt refused to mobilize past EOB during OT session today, but was agreeable to perform strength testing and discuss his daily routine, fall prevention, and energy conservation strategies. Pt feels he will likely need additional assistance from a Home Health Aide to assist him with safe bathing, meal prep, light housekeeping and laundry tasks while he attempts to regain his strength and endurance. Feel pt is a good [facility] program candidate and provided him with contact information for [facility] as he expressed significant interest. Pt will benefit from continued acute OT services to maximize pt's safety and independence with ADL, IADL, and functional mobility prior to discharge home. OT will continue to follow pt for energy conservation training and to assist pt in regaining strength and endurance.     Follow Up Recommendations  No OT follow up;Other (comment)(Home Health Aide services)    Equipment Recommendations  Tub/shower seat    Recommendations for Other Services       Precautions / Restrictions Precautions Precautions: Fall Restrictions Weight Bearing Restrictions: No      Mobility Bed Mobility Overal bed mobility: Modified Independent             General bed mobility comments: Increased time required due to severe fatigue  Transfers                 General transfer comment: Pt refused to mobilize OOB due to severe fatigue. Encouraged pt to ambulate with RN assistance prior to d/c to ensure he did not experience any  dizziness or severe fatigue before returning home.    Balance                                           ADL either performed or assessed with clinical judgement   ADL                                         General ADL Comments: Pt refused to mobilize OOB. Pt agreeable to discuss his daily routine, fall prevention strategies, and energy conservation. Pt feels he may need assistance at home such a home health aide to assist with bathing, meal prep, light housekeeping, and laundry while he regains his strength and endurance. Pt reports no concerns about his medication management as he uses a pill box and fills it independently.      Vision Baseline Vision/History: Wears glasses Wears Glasses: Reading only Patient Visual Report: No change from baseline Vision Assessment?: No apparent visual deficits     Perception     Praxis      Pertinent Vitals/Pain Pain Assessment: No/denies pain     Hand Dominance Right   Extremity/Trunk Assessment Upper Extremity Assessment Upper Extremity Assessment: Generalized weakness   Lower Extremity Assessment Lower Extremity Assessment: Defer to PT evaluation       Communication Communication  Communication: No difficulties   Cognition Arousal/Alertness: Awake/alert Behavior During Therapy: Flat affect Overall Cognitive Status: Within Functional Limits for tasks assessed                                     General Comments       Exercises     Shoulder Instructions      Home Living Family/patient expects to be discharged to:: Private residence Living Arrangements: Alone Available Help at Discharge: Family;Available PRN/intermittently Type of Home: House Home Access: Stairs to enter [employer] of Steps: 8 Entrance Stairs-Rails: Right;Left;Can reach both Home Layout: Multi-level;Bed/bath upstairs Alternate Level Stairs-Number of Steps: flight Alternate Level  Stairs-Rails: Left Bathroom Shower/Tub: Producer, television/film/video: Standard     Home Equipment: Environmental consultant - 2 wheels;Cane - single point          Prior Functioning/Environment Level of Independence: Independent with assistive device(s)        Comments: Pt reports difficulty with IADL (cooking, cleaning, laundry) due to fatigue and poor energy level. Pt does drive. Hx of falls, uses SPC for community mobility        OT Problem List: Decreased strength;Decreased activity tolerance;Impaired balance (sitting and/or standing);Decreased safety awareness;Decreased knowledge of use of DME or AE;Obesity      OT Treatment/Interventions: Self-care/ADL training;Therapeutic exercise;Energy conservation;DME and/or AE instruction;Therapeutic activities;Patient/family education;Balance training    OT Goals(Current goals can be found in the care plan section) Acute Rehab OT Goals Patient Stated Goal: return home OT Goal Formulation: With patient Time For Goal Achievement: 04/04/17 Potential to Achieve Goals: Good ADL Goals Pt Will Perform Tub/Shower Transfer: Shower transfer;with modified independence;ambulating;shower seat Pt/caregiver will Perform Home Exercise Program: Increased strength;Both right and left upper extremity;Independently;With written HEP provided;With theraband Additional ADL Goal #1: Pt will verbalize and/or demonstrate use of 2 energy conservation strategies during ADL task or functional mobility with no verbal cues.  OT Frequency: Min 1X/week   Barriers to D/C:            Co-evaluation              AM-PAC PT "6 Clicks" Daily Activity     Outcome Measure Help from another person eating meals?: None Help from another person taking care of personal grooming?: None Help from another person toileting, which includes using toliet, bedpan, or urinal?: None Help from another person bathing (including washing, rinsing, drying)?: A Little Help from another person  to put on and taking off regular upper body clothing?: None Help from another person to put on and taking off regular lower body clothing?: None 6 Click Score: 23   End of Session Nurse Communication: Mobility status  Activity Tolerance: Patient limited by fatigue Patient left: in bed;with call bell/phone within reach  OT Visit Diagnosis: History of falling (Z91.81);Muscle weakness (generalized) (M62.81)                Time: 1610-9604 OT Time Calculation (min): 12 min Charges:  OT General Charges $OT Visit: 1 Visit OT Evaluation $OT Eval Moderate Complexity: 1 Mod G-Codes:    Nils Pyle, MS ,OTR/L 03/21/2017, 2:52 PM

## 2017-03-21 NOTE — Discharge Summary (Addendum)
Physician Discharge Summary  Don [name] ZOX:[id number] DOB: Feb 08, 1946 DOA: 03/19/2017  PCP: Don Bark, NP  Admit date: 03/19/2017 Discharge date: 03/21/2017  Admitted From: Home Disposition: Home with Home Health   Recommendations for Outpatient Follow-up:  1. Follow up with PCP in 1-2 weeks 2. Follow up with Cardiology Dr. Ladona [name] within 1 week to discuss resumption of Lasix 3. Follow up with Gastroenterology Dr. Dulce [name] as an outpatient for follow up and for Hemorrhoidal and Hx of GIB Evaluation  4. Repeat TSH, Free T4 in 4-6 Weeks and ensure compliance with Levothyroxine; May need outpatient Endocrinology Consultation  5. Please obtain CMP/CBC, Mag, Phos in one week 6. Please follow up on the following pending results: T3 Level   Home Health: YES Equipment/Devices: Tub Information systems manager     Discharge Condition: Stable CODE STATUS: FULL CODE  Diet recommendation: Heart Healthy Low Salt Diet   Brief/Interim Summary: Don L Stephensonis a 71 y.o.malewith a Past Medical History of TIA; hypothyroidism; AICD; HTN; CAD; chronic combined heart failure CHF; and ETOH abuse who presents with weakness. He has markedly elevated TSH (has been on stable dose of Synthroid for many years); Mag 1.2; Phos 2.3; K+ 2.8; Albumin 3.3; Ionized calcium 0.84; TSH 126.664 with free T4 0.93. On exam, he is somewhat cantankerous but is loquacious and in NAD. He acknowledges depression and a solitary lifestyle with decreased mobility recently. He does have back pain which is contributing in the low back. Admitted for Generalized Weakness and Orthostatic Hypotension, and has multiple Electrolyte Abnormalities. He improved and electrolytes were repeat. Orthostatic vital signs were obtained prior to D/C and patient was not orthostatic. He did have some blood streaks on toilet paper from Hemorrhoids but Hb/Hct had remained stable. ECHO was done and was consistent with prior [facility]. Patient improved and  PT evaluated and recommended Home Health. He was deemed stable to D/C home and will need to follow up with PCP, Cardiology, Gastroenterology, and Endocrinology at D/C.   Discharge Diagnoses:  Principal Problem:   Weakness generalized Active Problems:   Chronic combined systolic (congestive) and diastolic (congestive) heart failure (HCC)   ETOH abuse   Postablative hypothyroidism - afetr RAI tx for Graves ds.   Hypokalemia   Hypocalcemia   HLD (hyperlipidemia)  Severe Generalized Weakness and Dizziness, improved  -Multifactorial, consider dehydration, in setting of ongoing use of alcohol, electrolyte abnormaliies, hypothyroidism -PT Evaluated and recommending Home Health PT -Correctedelectrolyte imbalance and increased Levothyroxine because of TSH Abnormality; Advised compliance.  -UA to rule out UTI as cause of weakness and Urine Negative -C/w IVF Rehydration as below  -Follow up with PCP at D/C   Orthostatic Hypotension -Most likely due to Autonomic Dysregulation -Orthostatics repeated yesteday and patient was still dizzy -Given 500 mL Bolus and restart IVF rehydration at 75 mL/hr x 15 hours -Hold Lasix for now and at D/C; Follow up with PCP or Cardiology to re-initiated  -Order TED Hose -Obtained ECHOCardiogram and showed EF of 35-40% with mild Diffuse Hypokinesis -AICD was interrogated and no issues -Repeat Orthostatic Vital Signs this AM showed patient was not orthostatic.  -Follow up with Cardiology Dr. Ladona [name] as an outpatient   Hypothyroidism -Post ablative after radiation therapy for Graves' disease -TSH is now 126.666; Free T4 was 0.93 -Patient is on 50 mcg ofLevothroxyine and increased to 75 mcg po daily  -Repeat TSH this AM showed TSH of 123 -Checked T3 but pending result   Hypokalemia and Hypocalcemia -Replete; K+ was 4.1 and Ca2+ Improved  to 8.2 -PTH was 47 -Continue to monitor and Repeat CMP as an outpatient   ETOH Abuse -CIWA Protocol and CIWA was  normal -C/w Folic Acid, MVI, Thiamine  Hypophosphatemia -Patient's Phos Level was 1.2 -Replete with IV KPhos 30 mmol -Continue to Monitor and repeat Phos level as an outpatient   Hypomagnesemia -Patient's Mag Level was 1.8 this AM -Replete with IV Mag Sulfate 2 grams -Continue to Monitor and Replete Mag Level as Necessary -Repeat Mag Level in AM  Back Pain -C/w K Pad -Follow up as an outpatient   HLD -C/w Atorvastatin 40 mg po qHS  Hx of Chronic Combined CHF and CAD and NICM -C/w Coreg and Atorvastatin  -Continue to monitor Volume Status Closely given CHF -Will continue to Hold Lasix at D/C and defer to PCP and Cardiology to Re-intiate   Hx of TIA/CVA -C/w Eliquis and Atorvastatin   Hx of GIB -Hemoglobin/Hct Stable at 10.1/30.4 -Patient had mild Bright Red Blood Streaks on toilet paper from hemorrhoids; Not consistent with Active GIB -Started Anusol Cream -On Anticoagulation with Eliquis  -Discussed Case with Dr. Evette [name] of [facility] and recommended outpatient evaluation and follow up with Dr. Willis [name]   Elevated AST -? Related to EtOH -Continue to Monitor and repeat CMP showed AST improved -If continues to trend up will recommend Acute Hepatitis Panel and RUQ to evaluate as an outpatient   Discharge Instructions  Discharge Instructions    (HEART FAILURE PATIENTS) Call MD:  Anytime you have any of the following symptoms: 1) 3 pound weight gain in 24 hours or 5 pounds in 1 week 2) shortness of breath, with or without a dry hacking cough 3) swelling in the hands, feet or stomach 4) if you have to sleep on extra pillows at night in order to breathe.   Complete by:  As directed    Call MD for:  difficulty breathing, headache or visual disturbances   Complete by:  As directed    Call MD for:  extreme fatigue   Complete by:  As directed    Call MD for:  hives   Complete by:  As directed    Call MD for:  persistant dizziness or light-headedness   Complete by:   As directed    Call MD for:  persistant nausea and vomiting   Complete by:  As directed    Call MD for:  redness, tenderness, or signs of infection (pain, swelling, redness, odor or green/yellow discharge around incision site)   Complete by:  As directed    Call MD for:  severe uncontrolled pain   Complete by:  As directed    Call MD for:  temperature >100.4   Complete by:  As directed    Diet - low sodium heart healthy   Complete by:  As directed    Discharge instructions   Complete by:  As directed    Follow up with PCP, Cardiology, Gastroenterology, and Endocrinology as an outpatient. Take all medications as prescribed. If symptoms change or worsen please return to the ED for evaluation.   Increase activity slowly   Complete by:  As directed      Allergies as of 03/21/2017      Reactions   Other    Animal Dander   Sulfa Antibiotics Nausea And Vomiting   Sulfasalazine Nausea And Vomiting      Medication List    STOP taking these medications   furosemide 20 MG tablet Commonly known as:  LASIX  TAKE these medications   allopurinol 100 MG tablet Commonly known as:  ZYLOPRIM Take 100 mg by mouth daily.   apixaban 5 MG Tabs tablet Commonly known as:  ELIQUIS Take 1 tablet (5 mg total) by mouth 2 (two) times daily. What changed:  when to take this   atorvastatin 40 MG tablet Commonly known as:  LIPITOR Take 40 mg by mouth at bedtime.   carvedilol 12.5 MG tablet Commonly known as:  COREG Take 6.25 mg by mouth 2 (two) times daily with a meal.   feeding supplement (ENSURE ENLIVE) Liqd Take 237 mLs by mouth 3 (three) times daily between meals.   folic acid 1 MG tablet Commonly known as:  FOLVITE Take 1 tablet (1 mg total) by mouth daily. Start taking on:  03/22/2017   hydrocortisone 2.5 % rectal cream Commonly known as:  ANUSOL-HC Place 1 application rectally 3 (three) times daily.   levothyroxine 75 MCG tablet Commonly known as:  SYNTHROID, LEVOTHROID Take 1  tablet (75 mcg total) by mouth daily before breakfast. Start taking on:  03/22/2017 What changed:    medication strength  how much to take   multivitamin with minerals Tabs tablet Take 1 tablet by mouth daily. Start taking on:  03/22/2017   nitroGLYCERIN 0.4 MG SL tablet Commonly known as:  NITROSTAT Place 1 tablet (0.4 mg total) under the tongue every 5 (five) minutes as needed for chest pain.   thiamine 100 MG tablet Take 1 tablet (100 mg total) by mouth daily. Start taking on:  03/22/2017      Follow-up Information    [facility] Follow [facility].   Specialty:  Home Health Services Why:  They will do your home health care at your home Contact information: [address] [place] [address] [phone]        Smothers, Cathleen Corti, NP. Call.   Specialty:  Nurse Practitioner Why:  Follow up within 1 week  Contact information: [address] [place] [address] [phone]        Marinus Maw, MD. Call.   Specialty:  Cardiology Why:  Follow up within 1 week  Contact information: [address] [place] [address] [phone]        Don Modena, MD. Call.   Specialty:  Gastroenterology Why:  Follow up with Dr. Dulce [name] as an outpatient  Contact information: [address] [place] [address] [phone]        Talmage Coin, MD. Call.   Specialty:  Endocrinology Why:  Call to establish for Subclinical Hypothyroidism. Contact information: [address] [place] [address] [phone]          Allergies  Allergen Reactions  . Other     Sport and exercise psychologist  . Sulfa Antibiotics Nausea And Vomiting  . Sulfasalazine Nausea And Vomiting   Consultations:  None  Procedures/Studies: Dg Chest 2 View  Result Date: 03/19/2017 CLINICAL DATA:  Weakness.  Orthostasis. EXAM: CHEST  2 VIEW COMPARISON:  12/25/2016 FINDINGS: Artifact overlies the chest. Pacemaker/AICD  remains in place. Heart size is normal. Chronic aortic atherosclerosis. Pulmonary vascularity is normal. Lungs are clear except for calcified granuloma in the right upper lobe. Blunting of the posterior costophrenic angles could be consistent with pleural scarring or a small amount of pleural fluid. No significant bone finding. IMPRESSION: Pacemaker/AICD. Aortic atherosclerosis. Posterior costophrenic angle blunting could be due to pleural scarring or small effusions. Electronically Signed  By: Paulina Fusi M.D.   On: 03/19/2017 12:53   Dg Thoracic Spine W/swimmers  Result Date: 03/19/2017 CLINICAL DATA:  Lethargy.  Back pain. EXAM: THORACIC SPINE - 3 VIEWS COMPARISON:  None. FINDINGS: Normal alignment. No fracture. No disc space narrowing. No focal bone finding. Posteromedial ribs are negative. IMPRESSION: Negative. Electronically Signed   By: Paulina Fusi M.D.   On: 03/19/2017 12:55   Dg Lumbar Spine Complete  Result Date: 03/19/2017 CLINICAL DATA:  Lethargy.  Back pain. EXAM: LUMBAR SPINE - COMPLETE 4+ VIEW COMPARISON:  CT 10/12/2015 FINDINGS: Alignment is normal. Mild disc space narrowing L3-4 and L4-5. Mild lower lumbar facet arthritis. No fracture or other focal spinal lesion. Aortoiliac calcification incidentally noted. Mild sacroiliac osteoarthritis and hip osteoarthritis left more than right. 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 Chronic lower lumbar facet arthritis. No fracture or other acute or advanced finding. Aortoiliac atherosclerosis. Sacroiliac osteoarthritis.  Hip osteoarthritis left more than right. Electronically Signed   By: Paulina Fusi M.D.   On: 03/19/2017 12:55   ECHOCARDIOGRAM ------------------------------------------------------------------- Study Conclusions  - Left ventricle: The cavity size was moderately dilated. Wall   thickness was increased in a pattern of moderate LVH. There was   mild concentric hypertrophy. Systolic function was  moderately   reduced. The estimated ejection fraction was in the range of 35%   to 40%. Mild diffuse hypokinesis. - Aortic valve: Transvalvular velocity was within the normal range.   There was no stenosis. There was no regurgitation. - Mitral valve: Transvalvular velocity was within the normal range.   There was no evidence for stenosis. There was trivial   regurgitation. - Left atrium: The atrium was mildly dilated. - Right ventricle: The cavity size was normal. Wall thickness was   normal. Systolic function was normal. - Tricuspid valve: There was trivial regurgitation. - Pulmonary arteries: Systolic pressure was within the normal   range. PA peak pressure: 30 mm Hg (S).  Subjective: Seen and examined and patient no longer symptomatic. Had mild blood on toilet paper but no active bleeding. No CP or SOB. No lightheadedness or dizziness when doing orthostatic vital signs and ambulating. Stressed compliance with Levothyroxine and follow ups and patient understood and agreed.   Discharge Exam: Vitals:   03/21/17 0452 03/21/17 1023  BP: 106/83 104/78  Pulse: 79   Resp: 18   Temp: 98.2 F (36.8 C)   SpO2: 96%    Vitals:   03/20/17 1935 03/20/17 2017 03/21/17 0452 03/21/17 1023  BP: (!) 85/61 115/89 106/83 104/78  Pulse: 77  79   Resp: 18  18   Temp: 98.3 F (36.8 C)  98.2 F (36.8 C)   TempSrc: Oral  Oral   SpO2: 97%  96%   Weight:   94 kg (207 lb 3.2 oz)   Height:       General: Pt is alert, awake, not in acute distress Cardiovascular: RRR, S1/S2 +, no rubs, no gallops Respiratory: Diminished bilaterally, no wheezing, no rhonchi Abdominal: Soft, NT, ND, bowel sounds + Extremities: no edema, no cyanosis  The results of significant diagnostics from this hospitalization (including imaging, microbiology, ancillary and laboratory) are listed below for reference.    Microbiology: No results found for this or any previous visit (from the past 240 hour(s)).   Labs: BNP (last  3 results) Recent Labs    09/01/16 1234  BNP 67.5   Basic Metabolic Panel: Recent Labs  Lab 03/19/17 1216 03/19/17 1458 03/19/17 1718  03/19/17 1729 03/20/17 0518 03/21/17 0416  NA 138 138  --   --  141 141  K 3.3* 2.8*  --   --  3.3* 4.1  CL 89* 94*  --   --  101 104  CO2 29  --   --   --  28 28  GLUCOSE 89 72  --   --  94 108*  BUN 7 7  --   --  10 9  CREATININE 1.45* 1.10  --   --  1.28* 1.19  CALCIUM 8.1*  --   --   --  7.7* 8.2*  MG  --   --  1.2*  --  1.6* 1.8  PHOS  --   --   --  2.3* 2.8 1.2*   Liver Function Tests: Recent Labs  Lab 03/17/17 1606 03/19/17 1216 03/21/17 0416  AST 47* 56* 42*  ALT 18 22 17   ALKPHOS 75 73 75  BILITOT 0.7 1.7* 0.8  PROT 6.3 6.5 5.2*  ALBUMIN 3.6 3.3* 2.4*   No results for input(s): LIPASE, AMYLASE in the last 168 hours. No results for input(s): AMMONIA in the last 168 hours. CBC: Recent Labs  Lab 03/19/17 1154 03/19/17 1458 03/20/17 0826 03/21/17 0416  WBC 8.1  --  5.1 5.4  NEUTROABS 6.2  --  3.7 3.8  HGB 12.7* 10.5* 10.5* 10.1*  HCT 35.7* 31.0* 31.5* 30.4*  MCV 97.3  --  96.9 99.7  PLT 147*  --  104* 94*   Cardiac Enzymes: Recent Labs  Lab 03/17/17 1606 03/19/17 1216  CKTOTAL 43  --   CKMBINDEX 1.7  --   TROPONINI  --  <0.03   BNP: Invalid input(s): POCBNP CBG: No results for input(s): GLUCAP in the last 168 hours. D-Dimer No results for input(s): DDIMER in the last 72 hours. Hgb A1c No results for input(s): HGBA1C in the last 72 hours. Lipid Profile No results for input(s): CHOL, HDL, LDLCALC, TRIG, CHOLHDL, LDLDIRECT in the last 72 hours. Thyroid function studies Recent Labs    03/21/17 0416  TSH 123.360*   Anemia work up No results for input(s): VITAMINB12, FOLATE, FERRITIN, TIBC, IRON, RETICCTPCT in the last 72 hours. Urinalysis    Component Value Date/Time   COLORURINE AMBER (A) 03/19/2017 1837   APPEARANCEUR HAZY (A) 03/19/2017 1837   LABSPEC 1.018 03/19/2017 1837   PHURINE 5.0  03/19/2017 1837   GLUCOSEU NEGATIVE 03/19/2017 1837   HGBUR NEGATIVE 03/19/2017 1837   BILIRUBINUR SMALL (A) 03/19/2017 1837   KETONESUR 5 (A) 03/19/2017 1837   PROTEINUR NEGATIVE 03/19/2017 1837   UROBILINOGEN 1.0 09/04/2014 1554   NITRITE NEGATIVE 03/19/2017 1837   LEUKOCYTESUR NEGATIVE 03/19/2017 1837   Sepsis Labs Invalid input(s): PROCALCITONIN,  WBC,  LACTICIDVEN Microbiology No results found for this or any previous visit (from the past 240 hour(s)).  Time coordinating discharge: 35 minutes  SIGNED:  Merlene Laughter, DO [facility] 03/21/2017, 8:42 PM Pager [phone]  If 7PM-7AM, please contact night-coverage [email or web address] Password [email or web address]

## 2017-03-21 NOTE — Progress Notes (Signed)
Pt reports hx of hemorrhoids and visible blood on paper post-BM. Pt denies complaints, other signs of bleeding.

## 2017-03-21 NOTE — Progress Notes (Signed)
Pts MAR cleaned up with medications from days ago that were not administered for whatever reason.

## 2017-03-22 LAB — T3: T3 TOTAL: 43 ng/dL — AB (ref 71–180)

## 2017-03-23 ENCOUNTER — Telehealth: Payer: Self-pay | Admitting: Internal Medicine

## 2017-03-23 NOTE — Telephone Encounter (Signed)
New message    Please Rodney Booze- RN  For Dr Leavy Cella [phone]    Pt c/o medication issue:  1. Name of Medication: Lasix  2. How are you currently taking this medication (dosage and times per day)? n/a  3. Are you having a reaction (difficulty breathing--STAT)? NO  4. What is your medication issue? Nurse calling to confirm if Lasix should be restarted and dosage.

## 2017-03-24 DIAGNOSIS — E039 Hypothyroidism, unspecified: Secondary | ICD-10-CM | POA: Diagnosis not present

## 2017-03-24 DIAGNOSIS — I420 Dilated cardiomyopathy: Secondary | ICD-10-CM | POA: Diagnosis not present

## 2017-03-24 DIAGNOSIS — F17210 Nicotine dependence, cigarettes, uncomplicated: Secondary | ICD-10-CM | POA: Diagnosis not present

## 2017-03-24 DIAGNOSIS — I5042 Chronic combined systolic (congestive) and diastolic (congestive) heart failure: Secondary | ICD-10-CM | POA: Diagnosis not present

## 2017-03-24 DIAGNOSIS — N189 Chronic kidney disease, unspecified: Secondary | ICD-10-CM | POA: Diagnosis not present

## 2017-03-24 DIAGNOSIS — K922 Gastrointestinal hemorrhage, unspecified: Secondary | ICD-10-CM | POA: Diagnosis not present

## 2017-03-24 DIAGNOSIS — I25118 Atherosclerotic heart disease of native coronary artery with other forms of angina pectoris: Secondary | ICD-10-CM | POA: Diagnosis not present

## 2017-03-24 DIAGNOSIS — I130 Hypertensive heart and chronic kidney disease with heart failure and stage 1 through stage 4 chronic kidney disease, or unspecified chronic kidney disease: Secondary | ICD-10-CM | POA: Diagnosis not present

## 2017-03-24 DIAGNOSIS — F1010 Alcohol abuse, uncomplicated: Secondary | ICD-10-CM | POA: Diagnosis not present

## 2017-03-24 DIAGNOSIS — I447 Left bundle-branch block, unspecified: Secondary | ICD-10-CM | POA: Diagnosis not present

## 2017-03-24 DIAGNOSIS — Z95810 Presence of automatic (implantable) cardiac defibrillator: Secondary | ICD-10-CM | POA: Diagnosis not present

## 2017-03-24 DIAGNOSIS — Z8673 Personal history of transient ischemic attack (TIA), and cerebral infarction without residual deficits: Secondary | ICD-10-CM | POA: Diagnosis not present

## 2017-03-24 DIAGNOSIS — Z9181 History of falling: Secondary | ICD-10-CM | POA: Diagnosis not present

## 2017-03-24 MED ORDER — FUROSEMIDE 20 MG PO TABS: 20.0000 mg | ORAL_TABLET | Freq: Every day | ORAL | 3 refills | Status: AC

## 2017-03-24 NOTE — Telephone Encounter (Signed)
Per Dr Ladona Ridgel, have patient resume prior home dose of furosemide.  Resume furosemide 20 mg one tablet by mouth daily.  Left CVM for Tasha notifying of order.  Prescription entered.  Left this nurse name and # for call back if any further questions.

## 2017-03-26 ENCOUNTER — Other Ambulatory Visit: Payer: Self-pay | Admitting: *Deleted

## 2017-03-26 NOTE — Patient Outreach (Signed)
[facility]) Care Management  03/26/2017  Don [name] 12/21/1945 [id number]  EMMI- Heart Failure RED ON EMMI ALERT DAY#: 2 DATE: 03/25/17 RED ALERT: Weighed themselves today? No   Outreach attempt #1 to patient. No answer. RN CM left HIPAA compliant message along with contact info.     Plan: RN CM will contact patient within one week.    Wynelle Cleveland, RN, BSN, MHA/MSL, [facility] Telephonic Care Manager Coordinator [facility] Direct Phone: [phone] Toll Free: [phone] Fax: [phone]

## 2017-03-27 ENCOUNTER — Ambulatory Visit: Payer: PPO | Admitting: *Deleted

## 2017-03-27 ENCOUNTER — Ambulatory Visit: Payer: Self-pay | Admitting: *Deleted

## 2017-03-27 ENCOUNTER — Other Ambulatory Visit: Payer: Self-pay | Admitting: *Deleted

## 2017-03-30 ENCOUNTER — Other Ambulatory Visit: Payer: Self-pay | Admitting: *Deleted

## 2017-03-30 ENCOUNTER — Encounter: Payer: Self-pay | Admitting: *Deleted

## 2017-03-30 NOTE — Patient Outreach (Signed)
[facility]) Care Management  03/27/17  Don [name] 31-Jul-1945 [id number]   Incoming call from [place], SW from [facility]). Vickie asked in [employer] was following patient for any services. RN CM informed Vickie about receiving a referral for due to a discharge from a recent discharge from the hospital. RN CM notified Vickie about the inability to contact patient. Vickie stated, it was best to contact patient on his cell phone. Vickie reported, home health PT/OT was prescribed for patient and he declined services. Vickie stated, patient was drinking alcohol very heavily, not eating, and he was not doing well. Vickie planned to contact patient's daughter to gather more information about patient.   Plan: RN CM will refer patient for [facility] services.  Wynelle Cleveland, RN, BSN, MHA/MSL, [facility] Telephonic Care Manager Coordinator [facility] Direct Phone: [phone] Cell Phone: [phone] Toll Free: [phone] Fax: [phone]

## 2017-03-30 NOTE — Patient Outreach (Addendum)
[facility]) Care Management  03/30/2017  SAY FRIPP 31-May-1945 [id number]  Pt provided verbal consent to contact his daughter Morrie Sheldon concerning the [facility] program also. Information obtained from the pt and his daughter today.  RN spoke with pt today and introduced the [facility] program and services and the purpose for today's call. RN inquired about pt's needs as he explained needing a Database administrator and someone to wash his dishes". Pt is aware that [facility] does not provider this services however RN offer local resources that would be able to accommodate his request however this maybe a fee for services provided. RN further discussed pt's medical issues and inquired on this management of care. HF- Pt reports having HF since 1990 and states he is very familiar with HF and managing this medical condition very well with acute issues at this time. Pt reports he will follow up concerning his ICD (2nd placement). Note pt on EMMI HF roster. HTN- Pt states he is managing his BP with no reported issues. Pt feels this is now controlled. WEAKNESS- Pt reports having difficulty getting out of his vehicle and having to request his neighbor to assist him getting out of his car. Pt states his legs are getting very weak and he is getting very tired after  small activities and stays in his bed a lot.  RN encouraged pt to take with his primary provided on 2/12 appointment and inquire on possibly receiving PT in the home for strengthening however his daughter indicates he currently has PT/OT with a [facility] agency. Stress the importance of pt participating in this services to build his strength and increase his mobility to prevent risk of falls or injuries. Pt states his thyroids levels were off and he is taking his levothyroxine with encountered issues. Pt states he is taking all her medication with a sufficient supply on all his medications. Due to pt's limited mobility and strength RN discussed level of care  issues with both the pt and the daughter concerning assisted living and services that can be provided at that level of care. Pt very adamant and refused to consider this at this time. Daughter receptive but aware that her father (the pt) will not consider this at this time. Based upon the above information discussed and pt refusal for services with [facility]. RN left a direct contact number for this RN case manager if pt would reconsider case management services with [facility]. Pt and daughter both appreciative for the call today. Case will be closed via community case management services by this nurse due to the decline of services. Pt aware RN will alert primary provider of pt's option to decline [facility] services. Will also update other involved disciplinary via [facility] services.  [name], RN Care Management Coordinator [facility] Main Office [phone]

## 2017-03-30 NOTE — Patient Outreach (Signed)
[facility]) Care Management 03/27/2017  Don [name] 1945/10/02 [id number]   EMMI-Heart Failure RED ON EMMI ALERT DAY#: 2 DATE: 03/25/17 RED ALERT: Weighed themselves today? No   Outreach attempt # 1 to patient. HIPAA identifiers verified x's 2. Patient reported, he is at home lying in the bed, has no energy, and remains in a lot of pain. He spoke about taking Excedrin for his pain.Marland Kitchen He stated, he hasn't eaten within the last 2 days. He verbalized taking his medications as prescribed, without eating. Patient stated, his daughter plans to come over his house the following day and she may assist him with getting food. Patient reported, he can ambulate to the bathroom with a cane. Patient reported, he doesn't weigh daily. He continues to smoke and drink, sometimes more often than other times. Patient acknowledged having a history of heart failure. He had a 2nd ICD placed. PT/OT services from [facility] visited patient home for start of care. [facility] services and benefits explained to patient and he agreed. Patient verbalized having a scheduled appointment with his primary MD on 03/31/17 and a Cardiology appointment on 04/21/17. He mentioned having a friend, who is on vacation that helps with his care.     Plan: RN CM advised patient to contact RNCM for any needs or concerns. RN CM advised patient to alert MD for any changes in conditions.  RN CM will send referral to [facility] RN for further in home eval/assessment of care needs and management of chronic conditions. RN CM will send [facility] SW referral for possible assistance with community resources related to transportation.  Wynelle Cleveland, RN, BSN, MHA/MSL, [facility] Telephonic Care Manager Coordinator [facility] Direct Phone: [phone] Toll Free: [phone] Fax: [phone]

## 2017-04-01 ENCOUNTER — Other Ambulatory Visit: Payer: Self-pay | Admitting: *Deleted

## 2017-04-01 ENCOUNTER — Encounter: Payer: Self-pay | Admitting: *Deleted

## 2017-04-01 NOTE — Patient Outreach (Signed)
[facility]) Care Management  04/01/2017  Don [name] [birth year] [id number]    Telephone Assessment  RN received a call from telephonic nurse indicating pt would like to reconsider community home visits from this nurse and requested a call back. RN contacted pt today and verified is intested in enrolling into the [facility] program and services for a community home visit. RN reiterated on the available services and inquired on pt medical issues once again. Pt verified he did not know anything about the HF zones although he has had HF for a long time but felt this would be a area to be educated on further due to his 2nd placement of an ICD. RN discussed the HF zones and verified pt is currently in the GREEN zone with no acute symptoms but seem to be aware then to contact his provider with acute symptoms. After further discussion RN offered pt a home visit for a more one-on-one consultation to focus on his knowledge base related to his HF and the importance of daily weights. Pt indicated the telephonic case manager is sending him a scale as his is no long working. RN stress the importance of daily weights as a prevention measures related to fluid retention. RN generated a plan of care, goals and intervention related to pt's HF as discussed above. Pt receptive to this care plan and willing to participate in managing his HF with this information being provided by this RN case manager. Will alert care-team, CMA and provider of pt's disposition with [facility]. Will scheduled pt's home visit according to his scheduled and preference within the next 2 weeks.  [name], RN Care Management Coordinator [facility] Main Office [phone]

## 2017-04-01 NOTE — Patient Outreach (Signed)
[facility]) Care Management  04/01/2017  RAJKUMAR DUCA 04-Sep-1945 [id number]  Outgoing telephone call to patient. HIPAA verified with patient. Patient reported, he is not doing well. He stated, he will have to reschedule his hospital follow-up appointment with Dr. Thurmond Butts (primary MD). RN CM and patient discussed calling MD to reschedule appointment. Patient stated, he plans to contact the MD to reschedule his appointment. He has 2 other appointments scheduled with Dr. Ladona Ridgel on 04/13/17 and Dr. Hyacinth Meeker on 04/30/17. Patient confirmed he doesn't weigh himself due to not having a scale. RN CM and patient discussed having a scale shipped to patient home and patient agreed. RN CM discussed with patient about weighing himself and documenting his weight. RN CM explained to patient about wearing the same amount of clothing, after the first void, and around the same time daily. Patient stated, he has a large orange juice bottle beside his bed. He uses the orange juice bottle to void in. He can mentally estimate his output compared to his input, per patient. RN CM discussed with patient the importance of documneting information. RN CM informed patient about sending a [facility] Calendar where he could document his information. Patient stated, he's had heart failure for the last 20 years. Patient reported, he is eating. His daughter purchased him some food on Sunday. Patient reported, he has an OT coming for a visit next week. He was unable to remember the name of the home health company. [facility] services and benefits explained to patient and he agreed to services.  Plan: RN CM will send referral to [facility] RN for further in home eval/assessment of care needs and management of chronic conditions (heart failure, keeping scheduled appointments, ETOH abuse, and health maintenance. RN CM will ship weight scale to patient and [facility] Calendar. RN CM advised patient to contact RNCM for any needs or  concerns. RN CM will send patient EMMI HF educational materials.   Wynelle Cleveland, RN, BSN, MHA/MSL, [facility] Telephonic Care Manager Coordinator [facility] Direct Phone: [phone] Cell Phone: [phone] Toll Free: [phone] Fax: [phone] \

## 2017-04-02 ENCOUNTER — Ambulatory Visit: Payer: Self-pay | Admitting: *Deleted

## 2017-04-03 ENCOUNTER — Other Ambulatory Visit: Payer: Self-pay | Admitting: *Deleted

## 2017-04-03 NOTE — Patient Outreach (Signed)
Patient triggered Red on EMMI Heart Failure Dashboard, notification sent to [name], RN.

## 2017-04-03 NOTE — Patient Outreach (Signed)
[facility]) Care Management  04/03/2017  Don [name] March 16, 1945 [id number]    RN attempted to contact today due to [facility] RED however unsuccessful and unable to leave a HIPAA voice message. Will follow up next week accordingly and inquire further.  [name], RN Care Management Coordinator [facility] Main Office [phone]

## 2017-04-06 ENCOUNTER — Telehealth: Payer: Self-pay | Admitting: Internal Medicine

## 2017-04-06 ENCOUNTER — Telehealth: Payer: Self-pay | Admitting: *Deleted

## 2017-04-06 NOTE — Telephone Encounter (Signed)
Audie Pinto calling with medical services, would like to know if Dr. Ladona Ridgel would sign patient death certificate.

## 2017-04-06 NOTE — Telephone Encounter (Signed)
Returned call to [employer].  Mr. Laurell Josephs found physician to sign death certificate.  Device clinic aware.  Will route to [facility].

## 2017-04-06 NOTE — Telephone Encounter (Signed)
Home phone- no answer, no VM. LM on mobile phone to return call to [facility]- direct # given.

## 2017-04-06 NOTE — Telephone Encounter (Signed)
ICD remote monitoring alert received this morning. VF episode with 30J shock 04/03/17 at 19:12. Therapy successfully terminated VF.   No answer, no VM.

## 2017-04-07 NOTE — Addendum Note (Signed)
Addended by: Marlow Baars on: 04/07/2017 02:12 PM   Modules accepted: Level of Service, SmartSet

## 2017-04-07 NOTE — Progress Notes (Signed)
This encounter was created in error - please disregard.

## 2017-04-08 ENCOUNTER — Other Ambulatory Visit: Payer: Self-pay | Admitting: *Deleted

## 2017-04-08 NOTE — Patient Outreach (Signed)
[facility]) Care Management  04/08/2017  Don [name] 1945/05/25 [id number]    Pt expired and case has been closed via [facility] services.  [name], RN Care Management Coordinator [facility] Main Office [phone]

## 2017-04-10 ENCOUNTER — Encounter: Payer: PPO | Admitting: Internal Medicine

## 2017-04-13 ENCOUNTER — Encounter: Payer: PPO | Admitting: Physician Assistant

## 2017-04-15 ENCOUNTER — Ambulatory Visit: Payer: PPO | Admitting: *Deleted

## 2017-04-17 DIAGNOSIS — 419620001 Death: Secondary | SNOMED CT | POA: Diagnosis not present

## 2017-04-17 DEATH — deceased

## 2017-08-21 ENCOUNTER — Encounter: Payer: Self-pay | Admitting: Cardiology

## 2017-12-04 IMAGING — CT CT ABD-PELV W/O CM
2 of 4 series · 17 of 46 positions shown, 19 images · non-contrast
Comparison: CT scan of September 04, 2014 and February 14, 2014.

CLINICAL DATA: Severe diarrhea.  Periumbilical abdominal pain.

EXAM:
CT ABDOMEN AND PELVIS WITHOUT CONTRAST
TECHNIQUE: Multidetector CT imaging of the abdomen and pelvis was performed
following the standard protocol without IV contrast.

[Series 2: abd/ pelvis 5.0 i30f 1 · axial · 0.88mm/px · z∈[-582,-147]mm · 14 of 97 slices shown, 16 images]
[im 5/97  soft-tissue]
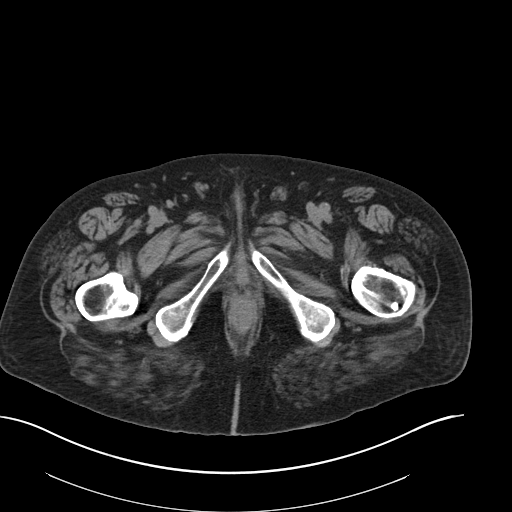
[im 5/97  bone]
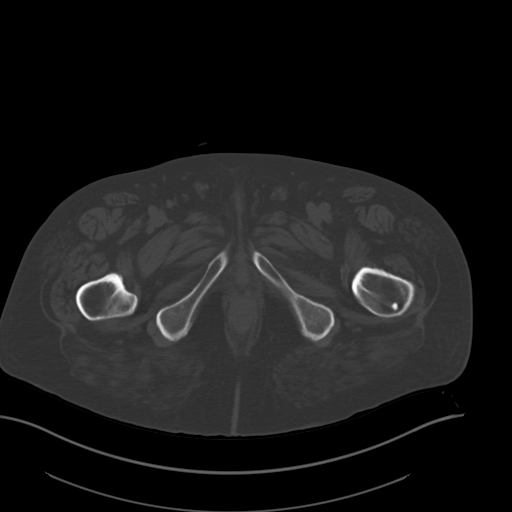
[im 13/97  soft-tissue]
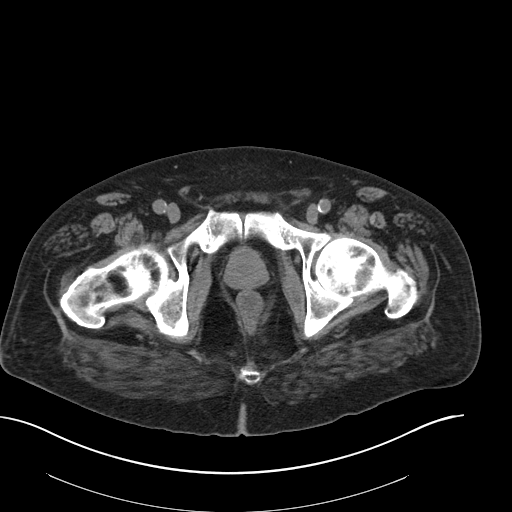
[im 17/97  soft-tissue]
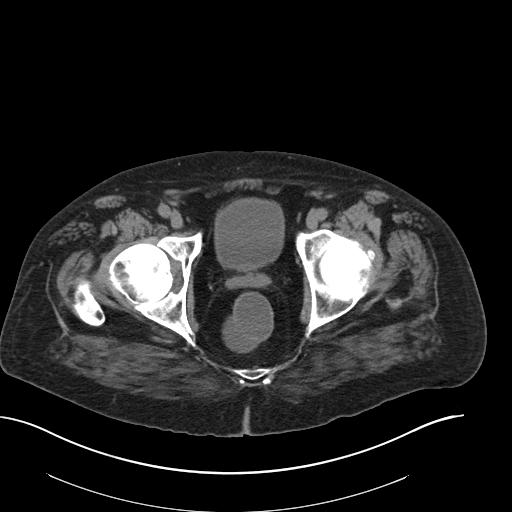
[im 26/97  soft-tissue]
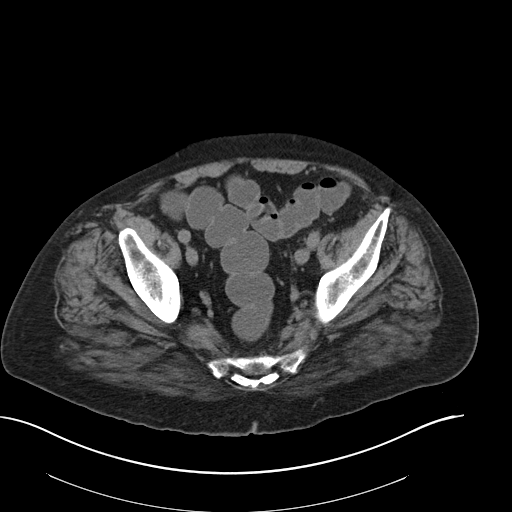
[im 34/97  soft-tissue]
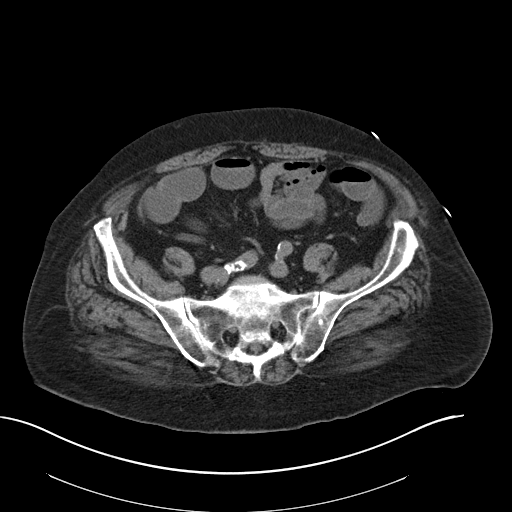
[im 38/97  soft-tissue]
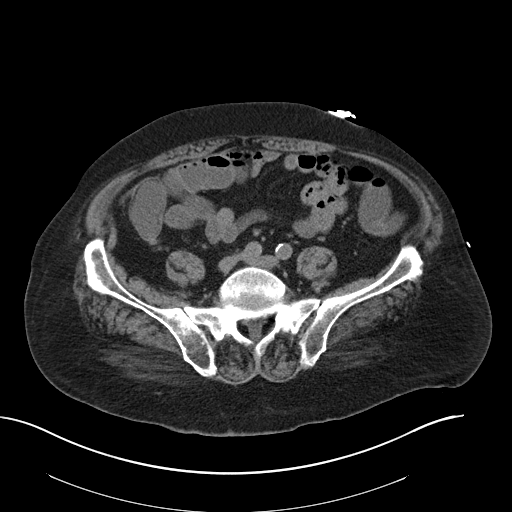
[im 46/97  soft-tissue]
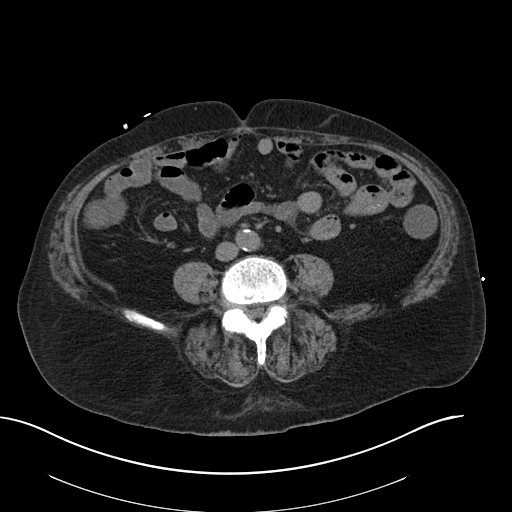
[im 51/97  soft-tissue]
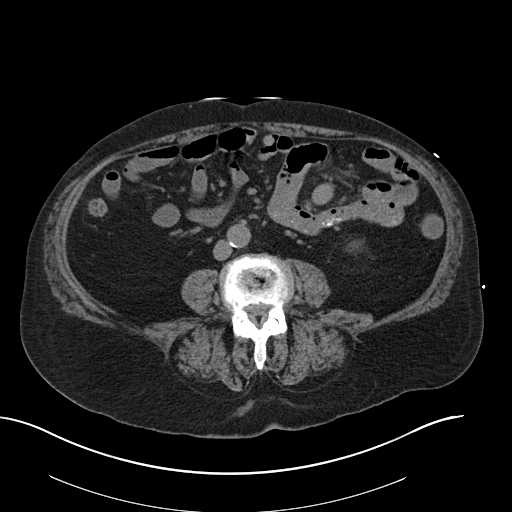
[im 59/97  soft-tissue]
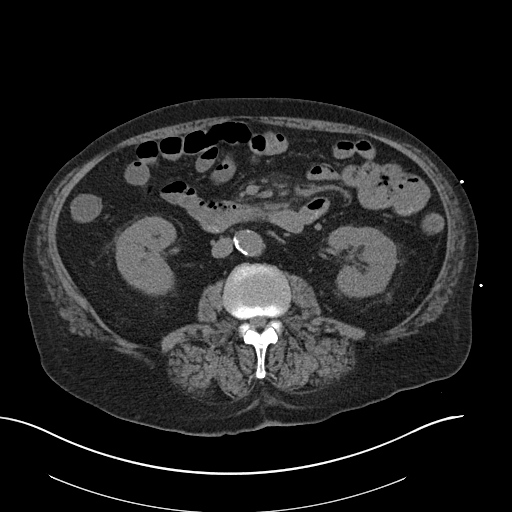
[im 59/97  bone]
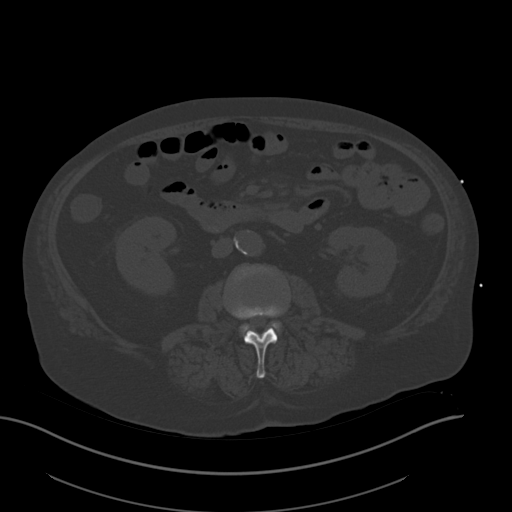
[im 63/97  soft-tissue]
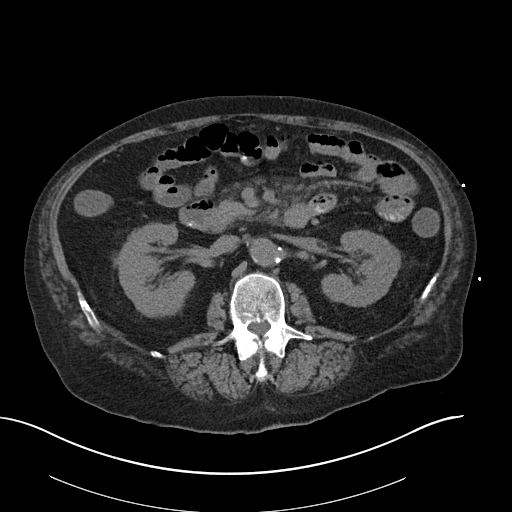
[im 71/97  soft-tissue]
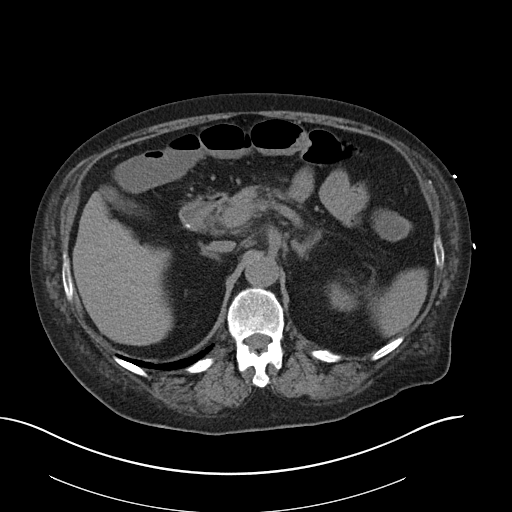
[im 80/97  soft-tissue]
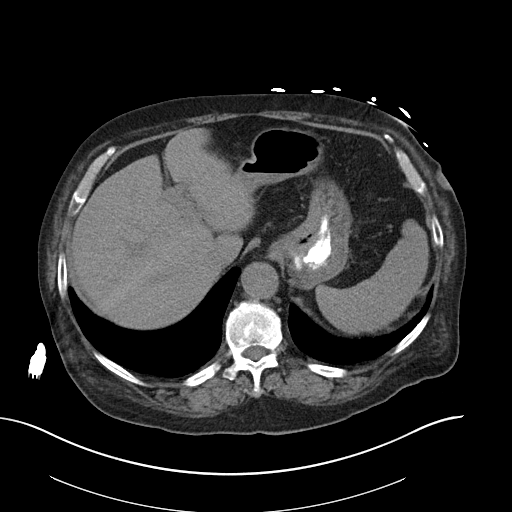
[im 84/97  soft-tissue]
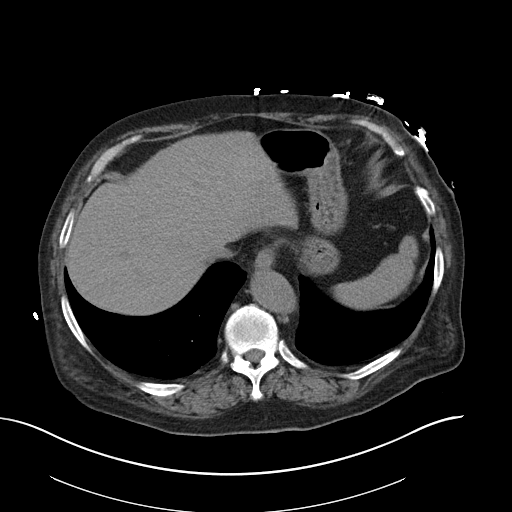
[im 92/97  soft-tissue]
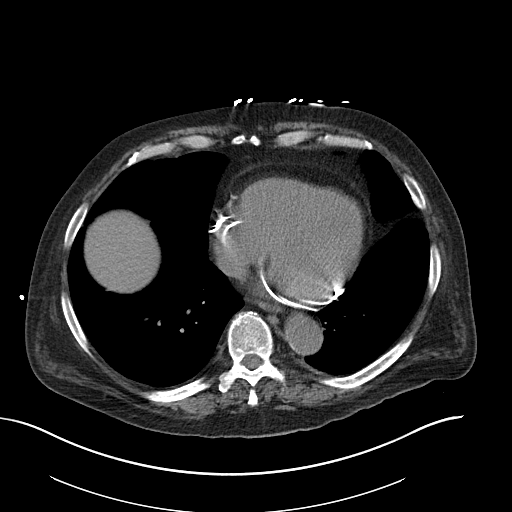

[Series 5: cor st · coronal · 0.98mm/px · 3 of 104 slices shown]
[im 35/104  soft-tissue]
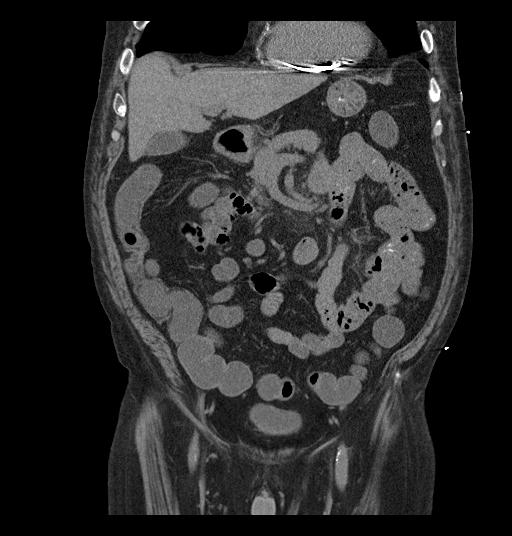
[im 46/104  soft-tissue]
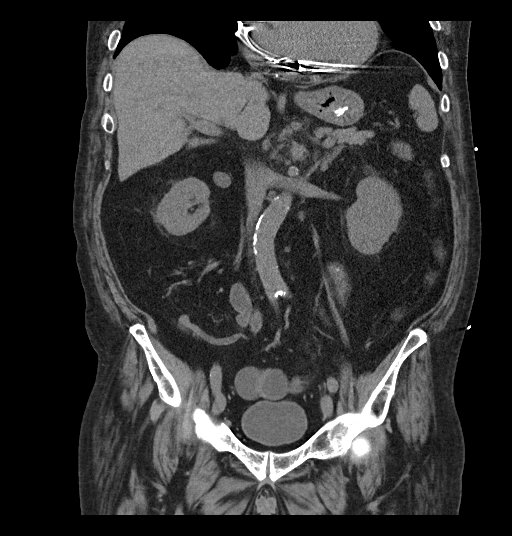
[im 58/104  soft-tissue]
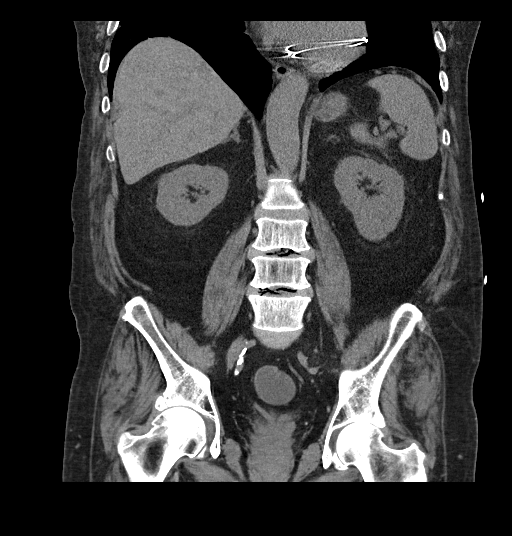

[17 of 46 positions shown; findings below may reference images not displayed]

FINDINGS: Mild multilevel degenerative disc disease is noted in the lower
lumbar spine. Visualized lung bases are unremarkable.

No gallstones are noted. Stable left hepatic cyst is noted. The
spleen and pancreas are unremarkable on these unenhanced images.
Adrenal glands and kidneys appear normal. No hydronephrosis or renal
obstruction is noted. No renal or ureteral calculi are noted.
Atherosclerosis of abdominal aorta is noted without aneurysm
formation. The appendix appears normal. There is no evidence of
bowel obstruction. Stable mildly enlarged mesenteric adenopathy is
noted most consistent with benign or reactive etiology. This most
likely represents reactive or inflammatory nodes.

Stable 2 cm low density seen medial to right kidney most consistent
with benign process.
IMPRESSION: Stable mesenteric adenopathy most likely benign or reactive in
etiology.

Aortic atherosclerosis.

No acute abnormality seen in the abdomen or pelvis.

## 2017-12-04 IMAGING — CR DG CHEST 1V PORT
1 series · 1 of 1 positions shown · non-contrast
Comparison: 09/04/2014

CLINICAL DATA: Weakness, possible sepsis

EXAM:
PORTABLE CHEST 1 VIEW

[AP]
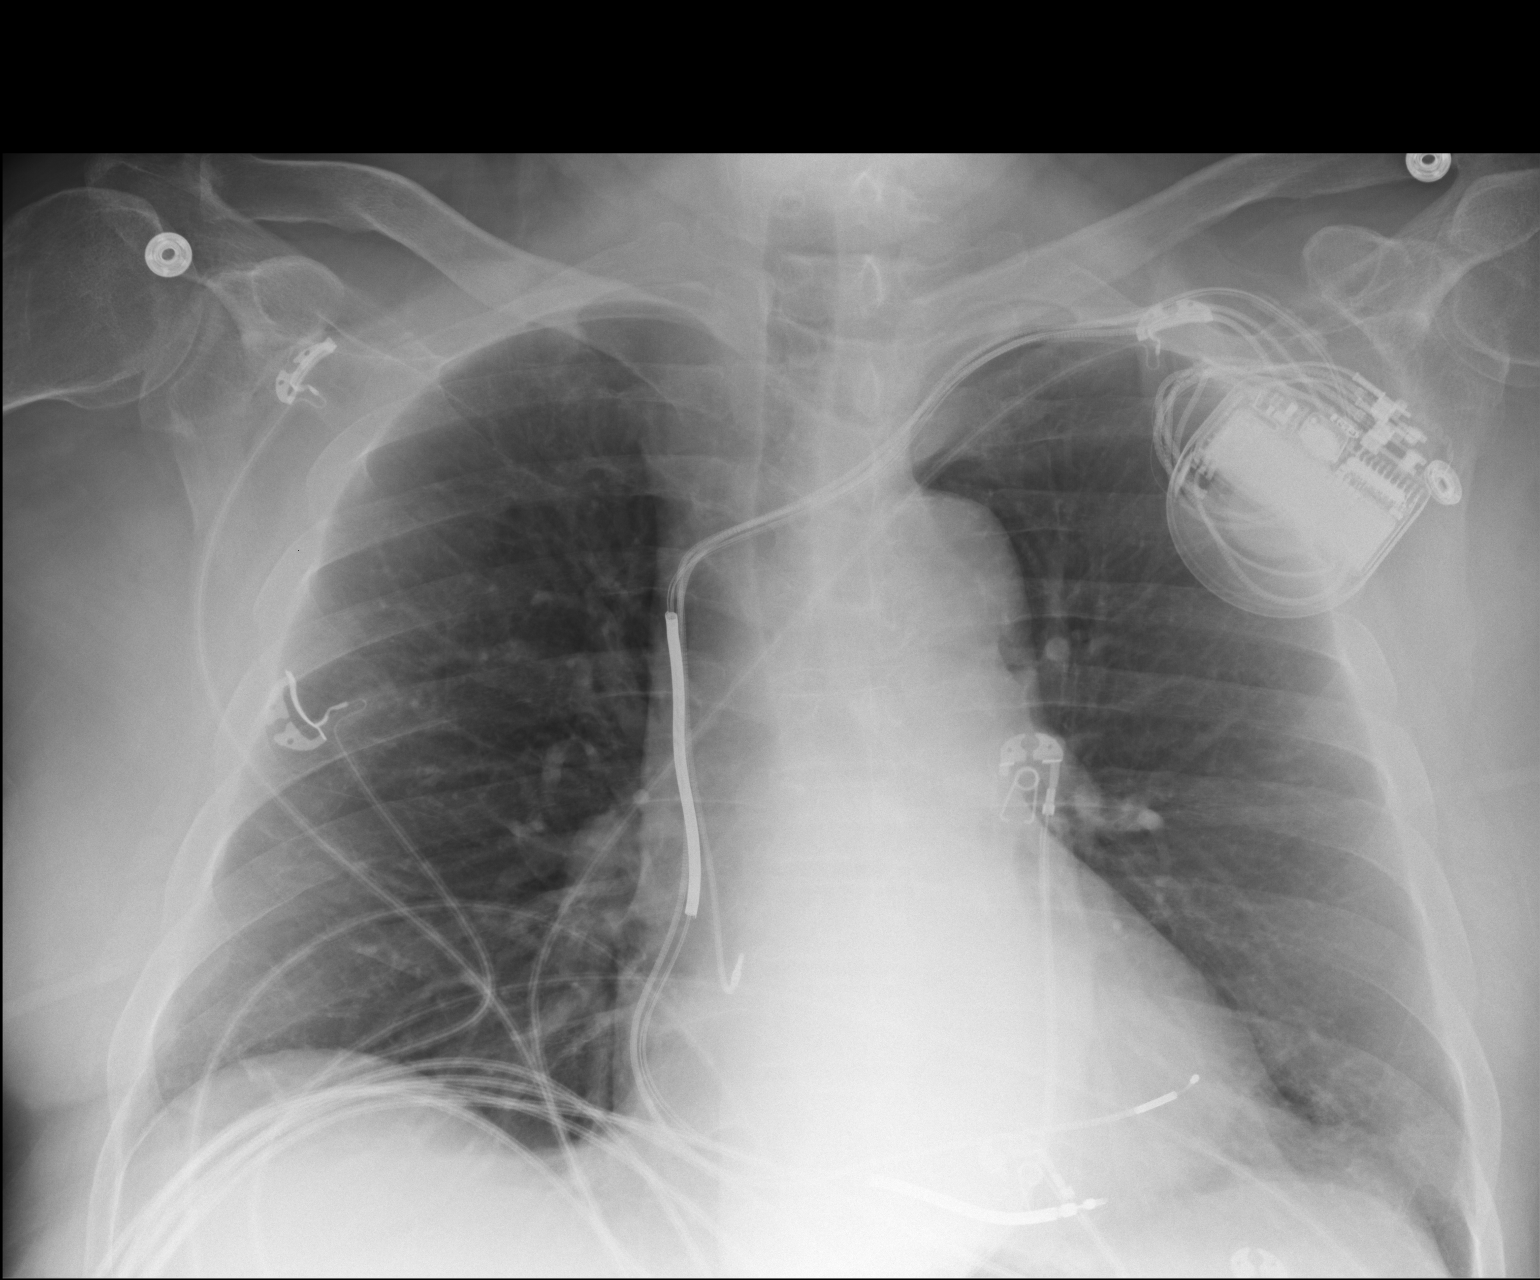

[1 of 1 positions shown; findings below may reference images not displayed]

FINDINGS: Cardiac shadow is stable. A defibrillator is again seen. No focal
infiltrate or sizable effusion is noted. No bony abnormality is
seen.
IMPRESSION: No active disease.

## 2017-12-04 IMAGING — CR DG SCAPULA*R*
2 series · 2 of 2 positions shown · non-contrast
Comparison: None.

CLINICAL DATA: Recent fall with right shoulder pain, initial
encounter

EXAM:
RIGHT SCAPULA - 2+ VIEWS

[AP (1 of 2)]
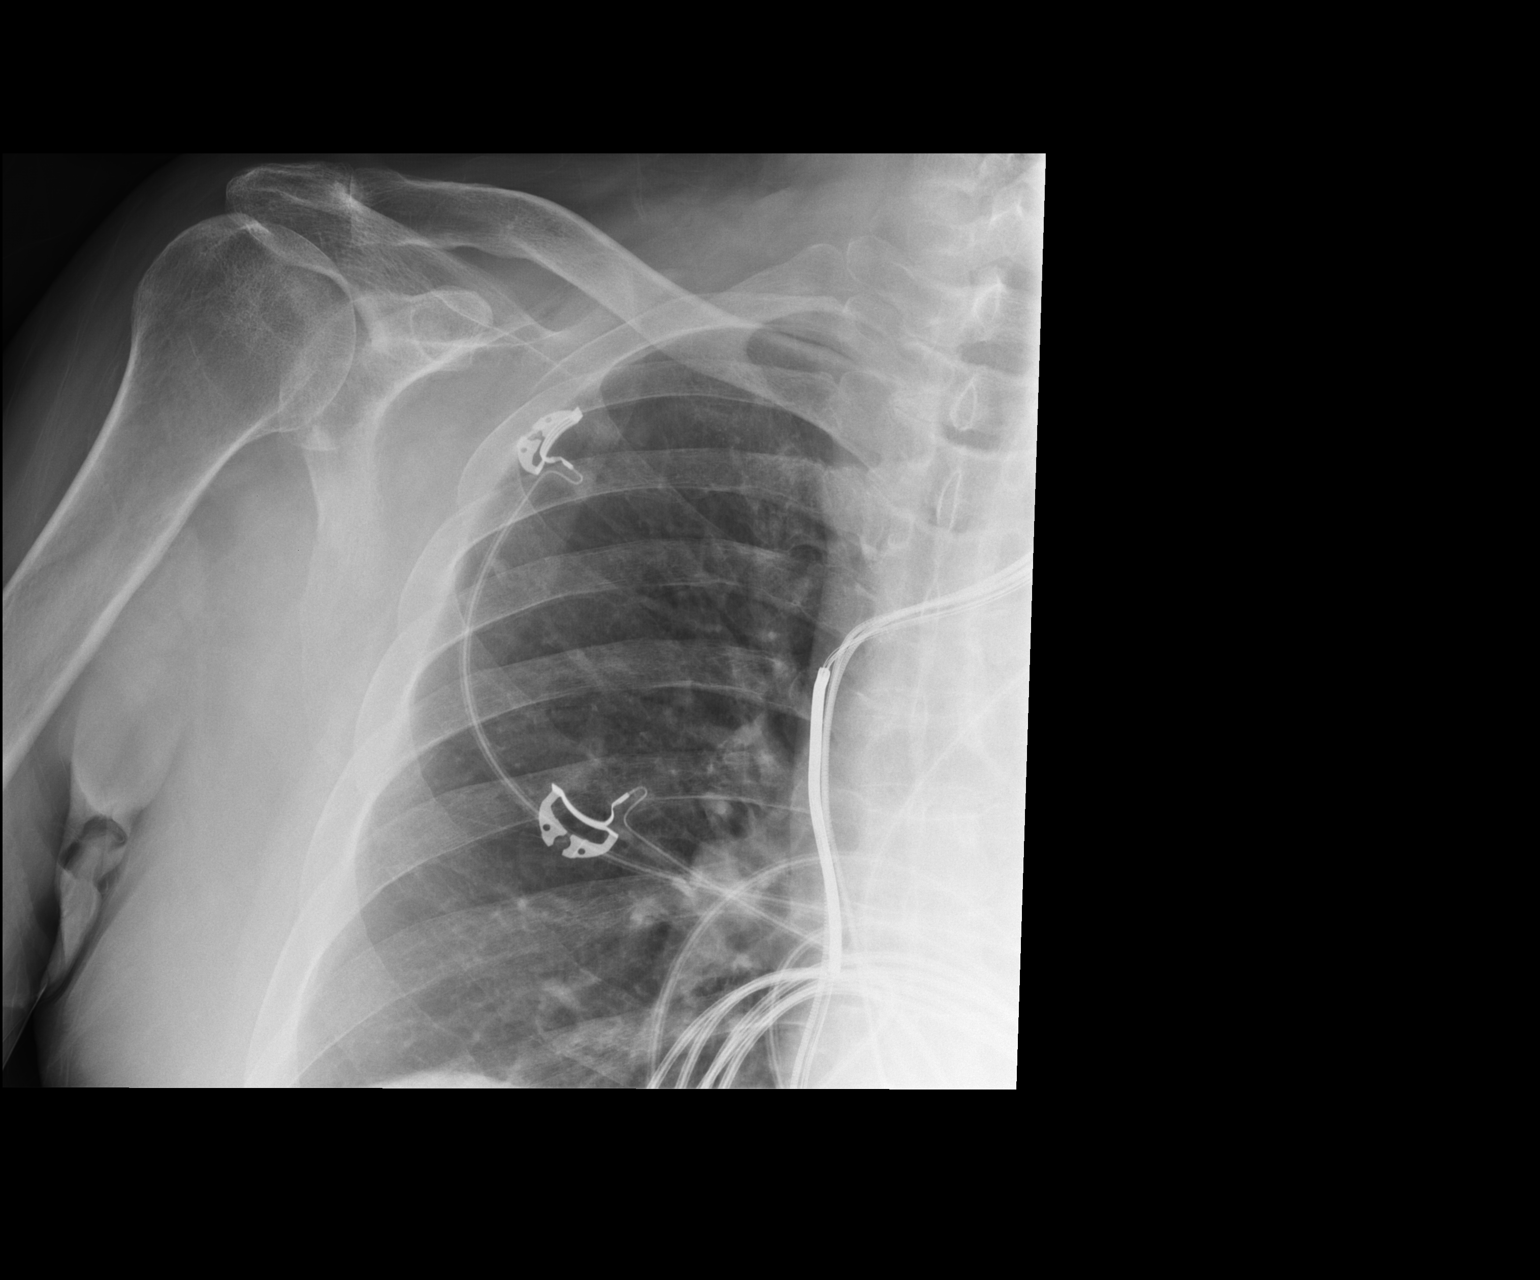

[AP (2 of 2)]
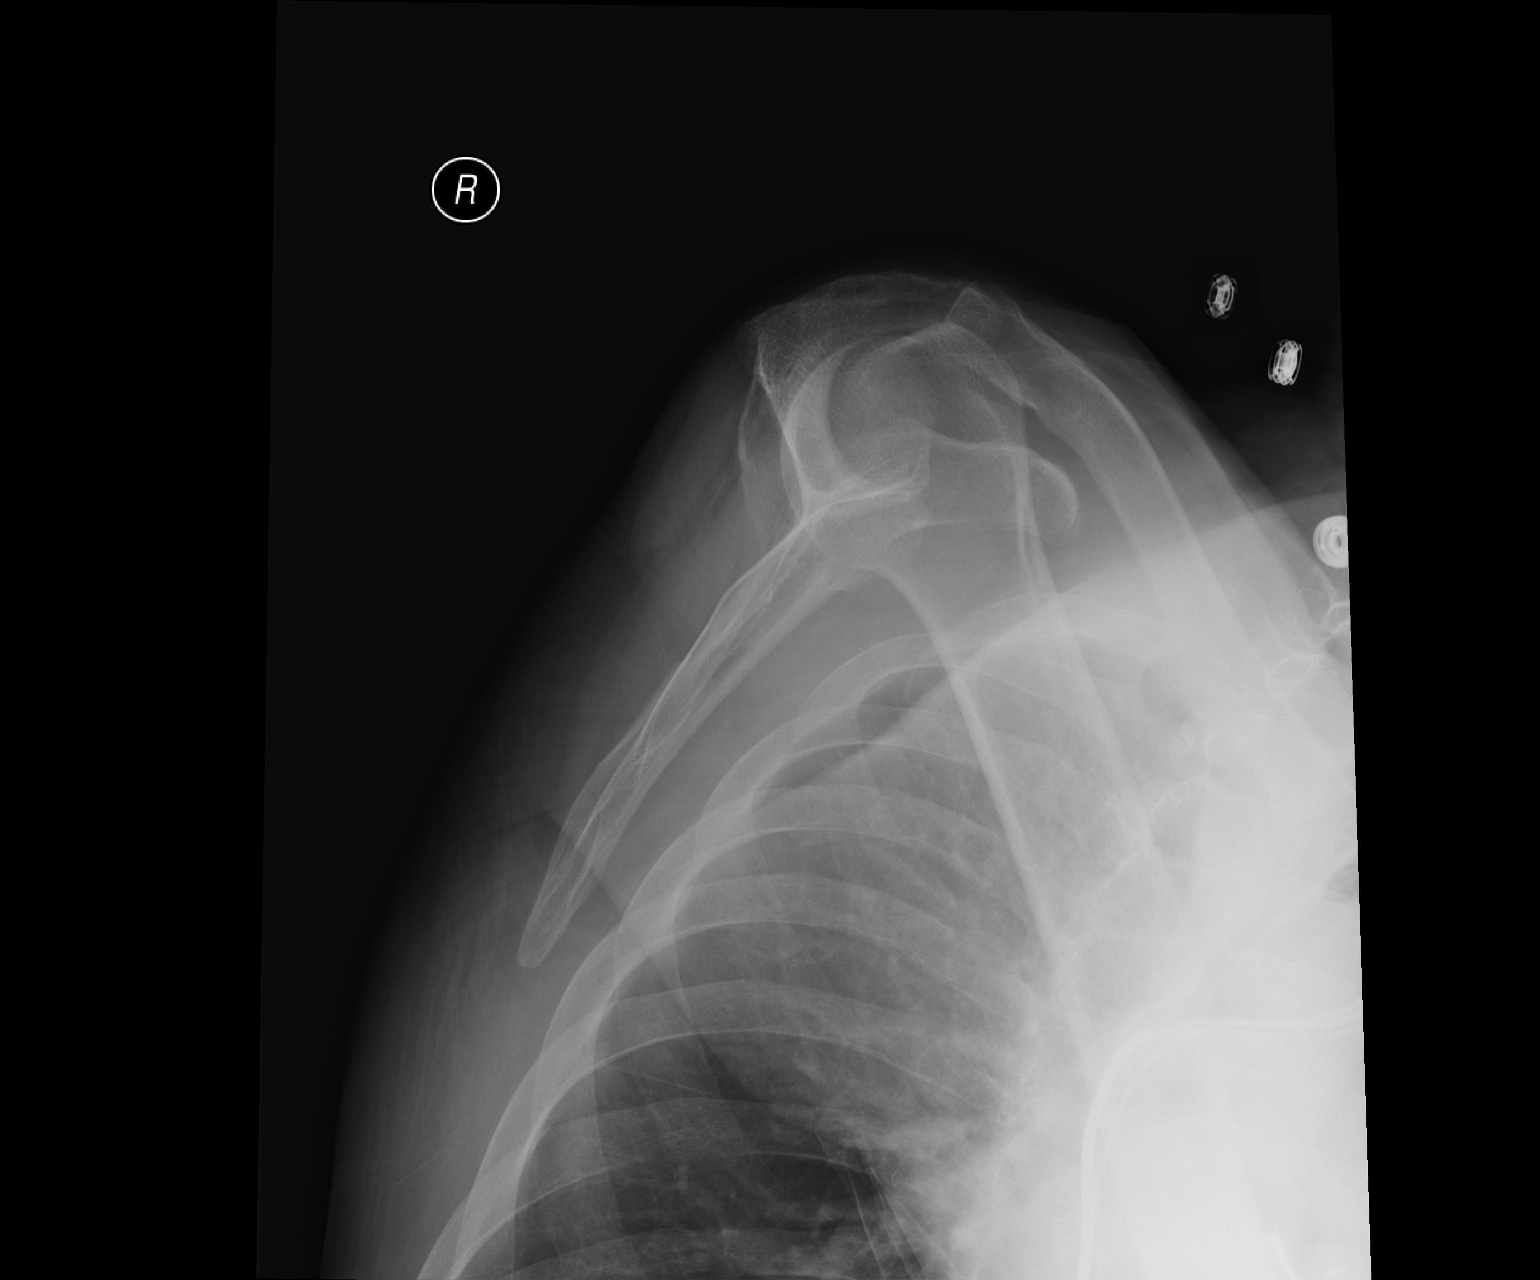

[2 of 2 positions shown; findings below may reference images not displayed]

FINDINGS: Mild degenerative changes of the acromioclavicular joint are seen.
No fracture or dislocation is noted. The underlying bony thorax is
within normal limits.
IMPRESSION: No acute abnormality noted.

## 2021-07-19 ENCOUNTER — Other Ambulatory Visit (HOSPITAL_BASED_OUTPATIENT_CLINIC_OR_DEPARTMENT_OTHER): Payer: Self-pay
